# Patient Record
Sex: Male | Born: 1940 | Race: White | Hispanic: No | Marital: Married | Smoking: Former smoker
Health system: Southern US, Community
[De-identification: ages and names within clinical notes are randomized; demographics above are authoritative.]

## PROBLEM LIST (undated history)

## (undated) DIAGNOSIS — M5430 Sciatica, unspecified side: Secondary | ICD-10-CM

## (undated) DIAGNOSIS — G629 Polyneuropathy, unspecified: Secondary | ICD-10-CM

## (undated) DIAGNOSIS — Z8546 Personal history of malignant neoplasm of prostate: Secondary | ICD-10-CM

## (undated) DIAGNOSIS — I714 Abdominal aortic aneurysm, without rupture, unspecified: Secondary | ICD-10-CM

## (undated) DIAGNOSIS — C61 Malignant neoplasm of prostate: Secondary | ICD-10-CM

## (undated) DIAGNOSIS — R002 Palpitations: Secondary | ICD-10-CM

## (undated) DIAGNOSIS — M47816 Spondylosis without myelopathy or radiculopathy, lumbar region: Secondary | ICD-10-CM

## (undated) DIAGNOSIS — E782 Mixed hyperlipidemia: Secondary | ICD-10-CM

## (undated) DIAGNOSIS — Z8619 Personal history of other infectious and parasitic diseases: Secondary | ICD-10-CM

## (undated) DIAGNOSIS — J439 Emphysema, unspecified: Secondary | ICD-10-CM

## (undated) DIAGNOSIS — J45909 Unspecified asthma, uncomplicated: Secondary | ICD-10-CM

## (undated) DIAGNOSIS — R339 Retention of urine, unspecified: Secondary | ICD-10-CM

## (undated) DIAGNOSIS — N35919 Unspecified urethral stricture, male, unspecified site: Secondary | ICD-10-CM

## (undated) DIAGNOSIS — Z972 Presence of dental prosthetic device (complete) (partial): Secondary | ICD-10-CM

## (undated) DIAGNOSIS — I480 Paroxysmal atrial fibrillation: Secondary | ICD-10-CM

## (undated) HISTORY — PX: EYE SURGERY: SHX253

## (undated) HISTORY — PX: OTHER SURGICAL HISTORY: SHX169

## (undated) HISTORY — DX: Mixed hyperlipidemia: E78.2

## (undated) HISTORY — DX: Palpitations: R00.2

## (undated) HISTORY — DX: Spondylosis without myelopathy or radiculopathy, lumbar region: M47.816

## (undated) HISTORY — DX: Malignant neoplasm of prostate: C61

## (undated) HISTORY — DX: Emphysema, unspecified: J43.9

---

## 2001-11-29 ENCOUNTER — Ambulatory Visit (HOSPITAL_COMMUNITY): Admission: RE | Admit: 2001-11-29 | Discharge: 2001-11-29 | Payer: Self-pay | Admitting: Family Medicine

## 2001-11-29 ENCOUNTER — Encounter: Payer: Self-pay | Admitting: Family Medicine

## 2002-12-02 ENCOUNTER — Ambulatory Visit (HOSPITAL_COMMUNITY): Admission: RE | Admit: 2002-12-02 | Discharge: 2002-12-02 | Payer: Self-pay | Admitting: Family Medicine

## 2002-12-02 ENCOUNTER — Encounter: Payer: Self-pay | Admitting: Family Medicine

## 2002-12-26 ENCOUNTER — Encounter: Payer: Self-pay | Admitting: *Deleted

## 2002-12-26 ENCOUNTER — Encounter (HOSPITAL_COMMUNITY): Admission: RE | Admit: 2002-12-26 | Discharge: 2003-01-25 | Payer: Self-pay | Admitting: *Deleted

## 2003-02-14 ENCOUNTER — Ambulatory Visit (HOSPITAL_COMMUNITY): Admission: RE | Admit: 2003-02-14 | Discharge: 2003-02-14 | Payer: Self-pay | Admitting: Internal Medicine

## 2003-02-14 HISTORY — PX: COLONOSCOPY: SHX174

## 2003-12-04 ENCOUNTER — Ambulatory Visit (HOSPITAL_COMMUNITY): Admission: RE | Admit: 2003-12-04 | Discharge: 2003-12-04 | Payer: Self-pay | Admitting: Family Medicine

## 2004-07-20 ENCOUNTER — Ambulatory Visit: Payer: Self-pay | Admitting: *Deleted

## 2004-07-20 ENCOUNTER — Inpatient Hospital Stay (HOSPITAL_COMMUNITY): Admission: EM | Admit: 2004-07-20 | Discharge: 2004-07-23 | Payer: Self-pay | Admitting: Emergency Medicine

## 2004-07-22 ENCOUNTER — Ambulatory Visit: Payer: Self-pay | Admitting: *Deleted

## 2004-07-23 ENCOUNTER — Ambulatory Visit: Payer: Self-pay | Admitting: *Deleted

## 2004-08-07 ENCOUNTER — Ambulatory Visit: Payer: Self-pay | Admitting: *Deleted

## 2004-08-21 ENCOUNTER — Ambulatory Visit (HOSPITAL_COMMUNITY): Admission: RE | Admit: 2004-08-21 | Discharge: 2004-08-21 | Payer: Self-pay | Admitting: *Deleted

## 2004-08-22 ENCOUNTER — Ambulatory Visit: Payer: Self-pay | Admitting: *Deleted

## 2004-08-29 ENCOUNTER — Ambulatory Visit: Payer: Self-pay | Admitting: *Deleted

## 2004-09-04 ENCOUNTER — Ambulatory Visit: Payer: Self-pay | Admitting: Critical Care Medicine

## 2004-09-06 ENCOUNTER — Ambulatory Visit (HOSPITAL_COMMUNITY): Admission: RE | Admit: 2004-09-06 | Discharge: 2004-09-06 | Payer: Self-pay | Admitting: Critical Care Medicine

## 2004-09-25 ENCOUNTER — Ambulatory Visit: Payer: Self-pay | Admitting: *Deleted

## 2004-10-09 ENCOUNTER — Ambulatory Visit: Payer: Self-pay | Admitting: Critical Care Medicine

## 2004-12-23 ENCOUNTER — Ambulatory Visit (HOSPITAL_COMMUNITY): Admission: RE | Admit: 2004-12-23 | Discharge: 2004-12-23 | Payer: Self-pay | Admitting: Family Medicine

## 2005-01-15 ENCOUNTER — Ambulatory Visit (HOSPITAL_COMMUNITY): Admission: RE | Admit: 2005-01-15 | Discharge: 2005-01-15 | Payer: Self-pay | Admitting: Critical Care Medicine

## 2005-02-20 ENCOUNTER — Ambulatory Visit: Payer: Self-pay | Admitting: *Deleted

## 2005-05-16 ENCOUNTER — Ambulatory Visit: Payer: Self-pay | Admitting: Internal Medicine

## 2005-05-30 ENCOUNTER — Ambulatory Visit: Payer: Self-pay | Admitting: Internal Medicine

## 2005-07-16 ENCOUNTER — Other Ambulatory Visit: Admission: RE | Admit: 2005-07-16 | Discharge: 2005-07-16 | Payer: Self-pay | Admitting: Urology

## 2005-08-11 ENCOUNTER — Ambulatory Visit: Payer: Self-pay | Admitting: *Deleted

## 2005-08-13 ENCOUNTER — Ambulatory Visit (HOSPITAL_COMMUNITY): Admission: RE | Admit: 2005-08-13 | Discharge: 2005-08-13 | Payer: Self-pay | Admitting: *Deleted

## 2005-08-29 ENCOUNTER — Ambulatory Visit: Payer: Self-pay | Admitting: *Deleted

## 2005-09-26 ENCOUNTER — Observation Stay (HOSPITAL_COMMUNITY): Admission: RE | Admit: 2005-09-26 | Discharge: 2005-09-27 | Payer: Self-pay | Admitting: Urology

## 2005-09-26 ENCOUNTER — Encounter (INDEPENDENT_AMBULATORY_CARE_PROVIDER_SITE_OTHER): Payer: Self-pay | Admitting: Urology

## 2005-12-22 ENCOUNTER — Ambulatory Visit (HOSPITAL_COMMUNITY): Admission: RE | Admit: 2005-12-22 | Discharge: 2005-12-22 | Payer: Self-pay | Admitting: Family Medicine

## 2006-08-13 ENCOUNTER — Ambulatory Visit: Payer: Self-pay | Admitting: Critical Care Medicine

## 2006-08-14 ENCOUNTER — Ambulatory Visit: Payer: Self-pay | Admitting: Cardiology

## 2006-08-21 ENCOUNTER — Ambulatory Visit: Payer: Self-pay | Admitting: Internal Medicine

## 2006-09-11 ENCOUNTER — Ambulatory Visit: Payer: Self-pay | Admitting: Critical Care Medicine

## 2006-10-05 ENCOUNTER — Ambulatory Visit: Admission: RE | Admit: 2006-10-05 | Discharge: 2007-01-03 | Payer: Self-pay | Admitting: Radiation Oncology

## 2006-11-06 ENCOUNTER — Ambulatory Visit (HOSPITAL_COMMUNITY): Admission: RE | Admit: 2006-11-06 | Discharge: 2006-11-06 | Payer: Self-pay | Admitting: Family Medicine

## 2006-11-26 ENCOUNTER — Ambulatory Visit (HOSPITAL_BASED_OUTPATIENT_CLINIC_OR_DEPARTMENT_OTHER): Admission: RE | Admit: 2006-11-26 | Discharge: 2006-11-26 | Payer: Self-pay | Admitting: Urology

## 2006-11-26 HISTORY — PX: OTHER SURGICAL HISTORY: SHX169

## 2007-03-24 ENCOUNTER — Ambulatory Visit (HOSPITAL_BASED_OUTPATIENT_CLINIC_OR_DEPARTMENT_OTHER): Admission: RE | Admit: 2007-03-24 | Discharge: 2007-03-24 | Payer: Self-pay | Admitting: Urology

## 2007-04-14 ENCOUNTER — Ambulatory Visit (HOSPITAL_COMMUNITY): Admission: RE | Admit: 2007-04-14 | Discharge: 2007-04-14 | Payer: Self-pay | Admitting: Family Medicine

## 2007-04-19 ENCOUNTER — Ambulatory Visit (HOSPITAL_COMMUNITY): Admission: RE | Admit: 2007-04-19 | Discharge: 2007-04-19 | Payer: Self-pay | Admitting: Family Medicine

## 2007-07-12 ENCOUNTER — Ambulatory Visit: Payer: Self-pay | Admitting: Internal Medicine

## 2007-07-14 ENCOUNTER — Ambulatory Visit (HOSPITAL_COMMUNITY): Admission: RE | Admit: 2007-07-14 | Discharge: 2007-07-15 | Payer: Self-pay | Admitting: Internal Medicine

## 2007-07-16 ENCOUNTER — Ambulatory Visit: Payer: Self-pay | Admitting: Internal Medicine

## 2007-08-31 DIAGNOSIS — J984 Other disorders of lung: Secondary | ICD-10-CM

## 2007-08-31 DIAGNOSIS — I4891 Unspecified atrial fibrillation: Secondary | ICD-10-CM | POA: Insufficient documentation

## 2007-08-31 DIAGNOSIS — I10 Essential (primary) hypertension: Secondary | ICD-10-CM | POA: Insufficient documentation

## 2007-09-28 ENCOUNTER — Ambulatory Visit: Payer: Self-pay | Admitting: Critical Care Medicine

## 2007-11-11 ENCOUNTER — Ambulatory Visit: Payer: Self-pay | Admitting: Internal Medicine

## 2008-04-24 ENCOUNTER — Encounter (INDEPENDENT_AMBULATORY_CARE_PROVIDER_SITE_OTHER): Payer: Self-pay | Admitting: Urology

## 2008-04-24 ENCOUNTER — Ambulatory Visit (HOSPITAL_BASED_OUTPATIENT_CLINIC_OR_DEPARTMENT_OTHER): Admission: RE | Admit: 2008-04-24 | Discharge: 2008-04-24 | Payer: Self-pay | Admitting: Urology

## 2008-06-30 ENCOUNTER — Ambulatory Visit: Payer: Self-pay | Admitting: Cardiology

## 2008-09-29 ENCOUNTER — Ambulatory Visit: Payer: Self-pay | Admitting: Critical Care Medicine

## 2008-10-04 ENCOUNTER — Ambulatory Visit: Payer: Self-pay | Admitting: Critical Care Medicine

## 2008-10-05 ENCOUNTER — Encounter: Payer: Self-pay | Admitting: Critical Care Medicine

## 2008-11-12 ENCOUNTER — Emergency Department (HOSPITAL_COMMUNITY): Admission: EM | Admit: 2008-11-12 | Discharge: 2008-11-12 | Payer: Self-pay | Admitting: Emergency Medicine

## 2008-11-13 ENCOUNTER — Ambulatory Visit: Payer: Self-pay | Admitting: Cardiology

## 2008-11-16 ENCOUNTER — Ambulatory Visit: Payer: Self-pay | Admitting: Cardiology

## 2008-11-24 ENCOUNTER — Ambulatory Visit (HOSPITAL_COMMUNITY): Admission: RE | Admit: 2008-11-24 | Discharge: 2008-11-24 | Payer: Self-pay | Admitting: Physician Assistant

## 2008-11-24 ENCOUNTER — Ambulatory Visit: Payer: Self-pay | Admitting: Cardiology

## 2009-01-24 ENCOUNTER — Ambulatory Visit (HOSPITAL_BASED_OUTPATIENT_CLINIC_OR_DEPARTMENT_OTHER): Admission: RE | Admit: 2009-01-24 | Discharge: 2009-01-24 | Payer: Self-pay | Admitting: Urology

## 2009-02-19 DIAGNOSIS — R002 Palpitations: Secondary | ICD-10-CM

## 2009-02-22 ENCOUNTER — Encounter: Payer: Self-pay | Admitting: Cardiology

## 2009-02-22 ENCOUNTER — Ambulatory Visit: Payer: Self-pay | Admitting: Cardiology

## 2009-03-09 ENCOUNTER — Ambulatory Visit: Payer: Self-pay | Admitting: Cardiology

## 2009-06-06 ENCOUNTER — Ambulatory Visit (HOSPITAL_COMMUNITY): Admission: RE | Admit: 2009-06-06 | Discharge: 2009-06-06 | Payer: Self-pay | Admitting: Orthopedic Surgery

## 2009-08-17 ENCOUNTER — Ambulatory Visit: Payer: Self-pay | Admitting: Cardiology

## 2009-08-17 DIAGNOSIS — R011 Cardiac murmur, unspecified: Secondary | ICD-10-CM

## 2009-11-09 ENCOUNTER — Ambulatory Visit: Payer: Self-pay | Admitting: Critical Care Medicine

## 2009-11-13 ENCOUNTER — Encounter: Payer: Self-pay | Admitting: Critical Care Medicine

## 2010-03-21 ENCOUNTER — Ambulatory Visit: Payer: Self-pay | Admitting: Cardiology

## 2010-08-09 ENCOUNTER — Telehealth: Payer: Self-pay | Admitting: Critical Care Medicine

## 2010-08-16 ENCOUNTER — Ambulatory Visit: Payer: Self-pay | Admitting: Critical Care Medicine

## 2010-09-15 HISTORY — PX: CATARACT EXTRACTION W/ INTRAOCULAR LENS  IMPLANT, BILATERAL: SHX1307

## 2010-10-02 ENCOUNTER — Ambulatory Visit
Admission: RE | Admit: 2010-10-02 | Discharge: 2010-10-02 | Payer: Self-pay | Source: Home / Self Care | Attending: Cardiology | Admitting: Cardiology

## 2010-10-02 DIAGNOSIS — N320 Bladder-neck obstruction: Secondary | ICD-10-CM | POA: Insufficient documentation

## 2010-10-03 ENCOUNTER — Encounter: Payer: Self-pay | Admitting: Cardiology

## 2010-10-15 NOTE — Miscellaneous (Signed)
Summary: Orders Update  Clinical Lists Changes  Orders: Added new Service order of Est. Patient Level III (99213) - Signed 

## 2010-10-15 NOTE — Progress Notes (Signed)
Summary: requesting rx for albuterol inhaler  Phone Note Call from Patient Call back at Home Phone [phone]   Caller: Pt Call For: PW Summary of Call: Pt called answering service and was reqquesting an rx for an albuterol inhaler. Pt was told our office was closed.   I called the pt and he states every fall he notices that he has increased SOB with exertion. he also c/o increased chest congestion, but denies cough. He states he just feels like his Spiriva does not las him all day and is requesting a rx for an albuterol inhaler to use when needed. Please advise.Carron Curie CMA  August 09, 2010 10:06 AM  [facility] [place]  Follow-up for Phone Call        ok to call in proair 1-2 puff q4-6 h as needed #1 with 4 refills might want to come in for OV sooner than his yearly Feb appt   Follow-up by: Storm Frisk MD,  August 09, 2010 12:40 PM  Additional Follow-up for Phone Call Additional follow up Details #1::        pt advsied and rx sent and appt set for 08-16-10 at 10:20. Carron Curie CMA  August 09, 2010 12:45 PM     New/Updated Medications: PROAIR HFA 108 (90 BASE) MCG/ACT AERS (ALBUTEROL SULFATE) 1-2 puffs every 4-6 hours as needed Prescriptions: PROAIR HFA 108 (90 BASE) MCG/ACT AERS (ALBUTEROL SULFATE) 1-2 puffs every 4-6 hours as needed  #1 x 4   Entered by:   Carron Curie CMA   Authorized by:   Storm Frisk MD   Signed by:   Carron Curie CMA on 08/09/2010   Method used:   Electronically to        [facility] (retail)       [address]       [place], [place]  [address]       Ph: [phone]       Fax: [phone]   RxID:   [id number]

## 2010-10-15 NOTE — Assessment & Plan Note (Signed)
Summary: 6 mth f/u per checkout on 08/17/09/tg   Visit Type:  Follow-up Referring Provider:  Dr. Shan Levans Primary Provider:  Dr. Butch Penny   History of Present Illness: 70 year old male presents for followup. He was last seen back in December 2010. He is here with his wife. He reports no change in baseline dyspnea on exertion in the setting of COPD, followed by Dr. Delford Field. He has occasional, brief palpitations, none prolonged or associated with chest pain. He reports compliance with his cardiac medications which are outlined below. He has had no definite episodes of recurrent sustained atrial fibrillation at least over the last year. He continues on aspirin in light of low CHADS2 score.  Current Medications (verified): 1)  Adult Aspirin Low Strength 81 Mg  Tbdp (Aspirin) .... Take 1 Tablet By Mouth Once A Day 2)  Multiple Vitamins   Tabs (Multiple Vitamin) .... Take 1 Tablet By Mouth Once A Day 3)  Neurontin 600 Mg Tabs (Gabapentin) .... Take 1 Tab Three Times A Day 4)  Ra Fish Oil 1000 Mg Caps (Omega-3 Fatty Acids) .... Take 1 Tablet By Mouth Once A Day 5)  Vitamin D 400 Unit Caps (Cholecalciferol) .... Take 1 Tab Daily 6)  Cardizem Cd 120 Mg Xr24h-Cap (Diltiazem Hcl Coated Beads) .... Take 2 Tablets in The Morning and 1 Tablet in The Evening 7)  Spiriva Handihaler 18 Mcg Caps (Tiotropium Bromide Monohydrate) .... Inhale Contents of 1 Capsule Once Daily 8)  Tramadol Hcl 50 Mg Tabs (Tramadol Hcl) .... Two Times A Day 9)  Lortab 5-500 Mg Tabs (Hydrocodone-Acetaminophen) .... Use As Directed 10)  Lyrica 150 Mg Caps (Pregabalin) .... Take 1 Tab Two Times A Day  Allergies (verified): No Known Drug Allergies  Past History:  Past Medical History: Last updated: 11/09/2009 Prostate cancer s/p radium seed implant with PSA now <2 2008 Pulmonary nodule - stable with serial CT Chest 2005-2007 COPD - FeV1 70%  TLC 114% DLCO 74% 2010 Atrial  Fibrillation Hyperlipidemia Hypertension Palpitations Cardiac murmur Low risk Myoview November 2005 Neuropathy lower back DJD lumbar spine  Social History: Last updated: 08/17/2009 Patient states former smoker Alcohol Use - no  Clinical Review Panels:  Echocardiogram Echocardiogram  FINDINGS:  Technical quality of the study is extremely poor.  There were  only limited views available.  Most specifically, the subcostal view  appeared to be the most accurate where there were some apical views which  allowed assessment of all the walls.  M-mode tracings were inadequate.   2-D AND DOPPLER IMAGING:  The left ventricle appears to be normal size with  normal systolic function.  Estimated ejection fraction is 60-65%.  I did not  see any significant wall motion abnormalities.  There appears to be no  significant valvular heart disease.  There is no pericardial effusion.  Beyond that, the study is tremendously limited.  The right ventricle also  appears to be normal size. (07/22/2004)    Review of Systems       The patient complains of dyspnea on exertion.  The patient denies anorexia, fever, chest pain, syncope, peripheral edema, prolonged cough, melena, hematochezia, and severe indigestion/heartburn.         Otherwise reviewed and negative except as outlined.  Vital Signs:  Patient profile:   70 year old male Weight:      180 pounds Pulse rate:   74 / minute BP sitting:   125 / 71  (right arm)  Vitals Entered By: Dreama Saa, CNA (March 21, 2010 1:51 PM)  Physical Exam  Additional Exam:  Comfortable in no acute distress. HEENT: Conjuctivae and lids normal, oropharynx clear with moist mucosa. Neck: Supple, no elevated JVP, no loud carotid bruits, no thyromegaly or tenderness. Lungs: Nonlabored breathing at rest. CTA with diminished breath sounds but no wheezes. Cor: PMI nondisplaced. RRR with occasional ectopy, normal S1/S2. No pathologic systolic murmurs. No S3 or  rub. Abd: Soft, NTND.  No HSM. No bruits. Normoactive bowel sounds. Ext: No CCE. Distal pulses 1+.    Impression & Recommendations:  Problem # 1:  ATRIAL FIBRILLATION, PAROXYSMAL (ICD-427.31)  Well-controlled on present medical regimen, without any obvious sustained breakthrough arrhythmias. He continues on aspirin with low CHADS2 score. Otherwise no changes of present rate control regimen. Follow up in 6 months, sooner if needed.  His updated medication list for this problem includes:    Adult Aspirin Low Strength 81 Mg Tbdp (Aspirin) .Marland Kitchen... Take 1 tablet by mouth once a day  Problem # 2:  HYPERTENSION (ICD-401.9)  Blood pressure well-controlled today. No medical regimen changes.  His updated medication list for this problem includes:    Adult Aspirin Low Strength 81 Mg Tbdp (Aspirin) .Marland Kitchen... Take 1 tablet by mouth once a day    Cardizem Cd 120 Mg Xr24h-cap (Diltiazem hcl coated beads) .Marland Kitchen... Take 2 tablets in the morning and 1 tablet in the evening  Patient Instructions: 1)  Your physician recommends that you schedule a follow-up appointment in: 6 months 2)  Your physician recommends that you continue on your current medications as directed. Please refer to the Current Medication list given to you today. Prescriptions: CARDIZEM CD 120 MG XR24H-CAP (DILTIAZEM HCL COATED BEADS) take 2 tablets in the morning and 1 tablet in the evening  #90 x 6   Entered by:   Larita Fife Via LPN   Authorized by:   Loreli Slot, MD, [facility]   Signed by:   Larita Fife Via LPN on 47/82/9562   Method used:   Electronically to        [facility] (retail)       [address]       [place], [place]  [address]       Ph: [phone]       Fax: [phone]   RxID:   [id number]

## 2010-10-15 NOTE — Assessment & Plan Note (Signed)
Summary: Pulmonary OV   Primary Provider/Referring Provider:  Dr. Butch Penny  CC:  Yearly follow up for COPD.  states breathing is the same-no better or worse.  denies wheezing and chest tightness.  states he is coughing "very little"-dry.  states this started in January when he had a cold.  Requesting rxs for [facility] [place].Marland Kitchen  History of Present Illness: This is a 70 yo WM with COPD Golds Stage I  September 29, 2008 9:06 AM one year f/u. had bladder neck work done few months ago per GU that was prostrate ca complication No new resp complaints.  cold air is an issue. No mucous.  No chest pain.   Pt denies any significant sore throat, nasal congestion or excess secretions, fever, chills, sweats, unintended weight loss, pleurtic or exertional chest pain, orthopnea PND, or leg swelling Pt denies any increase in rescue therapy over baseline, denies waking up needing it or having any early am or nocturnal exacerbations of coughing/wheezing/or dyspnea.   November 09, 2009 9:16 AM Yearly f/u.  No new issues excpet an issue in heat  and cold extremes.  sl ocugh and is dry.  1/11 was ill with viral syndrome and [facility] rx ABX and mucinex and this helped Pt denies any significant sore throat, nasal congestion or excess secretions, fever, chills, sweats, unintended weight loss, pleurtic or exertional chest pain, orthopnea PND, or leg swelling Pt denies any increase in rescue therapy over baseline, denies waking up needing it or having any early am or nocturnal exacerbations of coughing/wheezing/or dyspnea.   Preventive Screening-Counseling & Management  Alcohol-Tobacco     Smoking Status: quit > 6 months  Current Medications (verified): 1)  Adult Aspirin Low Strength 81 Mg  Tbdp (Aspirin) .... Take 1 Tablet By Mouth Once A Day 2)  Multiple Vitamins   Tabs (Multiple Vitamin) .... Take 1 Tablet By Mouth Once A Day 3)  Neurontin 600 Mg Tabs (Gabapentin) .... Take 1 Tab Three  Times A Day 4)  Ra Fish Oil 1000 Mg Caps (Omega-3 Fatty Acids) .... Take 1 Tablet By Mouth Once A Day 5)  Vitamin D 400 Unit Caps (Cholecalciferol) .... Take 1 Tab Daily 6)  Cardizem Cd 120 Mg Xr24h-Cap (Diltiazem Hcl Coated Beads) .... Take 2 Tablets in The Morning and 1 Tablet in The Evening 7)  Spiriva Handihaler 18 Mcg Caps (Tiotropium Bromide Monohydrate) .... Inhale Contents of 1 Capsule Once Daily 8)  Tramadol Hcl 50 Mg Tabs (Tramadol Hcl) .... Two Times A Day  Allergies (verified): No Known Drug Allergies  Past History:  Past medical, surgical, family and social histories (including risk factors) reviewed, and no changes noted (except as noted below).  Past Medical History: Prostate cancer s/p radium seed implant with PSA now <2 2008 Pulmonary nodule - stable with serial CT Chest 2005-2007 COPD - FeV1 70%  TLC 114% DLCO 74% 2010 Atrial Fibrillation Hyperlipidemia Hypertension Palpitations Cardiac murmur Low risk Myoview November 2005 Neuropathy lower back DJD lumbar spine  Past Surgical History: Reviewed history from 08/17/2009 and no changes required. Bladder neck stricture repair 2009  Past Pulmonary History:  Pulmonary History: Pulmonary Function Test  Date: 10/04/2008 Height (in.): 69 Gender: Male  Pre-Spirometry  FVC     Value: 4.26 L/min   Pred: 4.30 L/min     % Pred: 99 % FEV1     Value: 2.05 L     Pred: 2.94 L     % Pred: 70 % FEV1/FVC   Value:  48 %     Pred: 69 %     FEF 25-75   Value: 0.48 L/min   Pred: 2.74 L/min     % Pred: 18 %  Post-Spirometry  FVC     Value: 4.26 L/min   Pred: 4.30 L/min     % Pred: 99 % FEV1     Value: 1.95 L     Pred: 2.94 L     % Pred: 66 % FEV1/FVC   Value: 46 %     Pred: 69 %     FEF 25-75   Value: 0.42 L/min   Pred: 2.74 L/min     % Pred: 15 %  Lung Volumes  TLC     Value: 7.21 L   % Pred: 114 % RV     Value: 2.57 L   % Pred: 106 % DLCO     Value: 16.1 %   % Pred: 74 % DLCO/VA   Value: 2.67 %   % Pred: 75 %  Family  History: Reviewed history from 09/28/2007 and no changes required. COPD: mother died of copd  MI/Heart Attack brother expired of MI  Social History: Reviewed history from 08/17/2009 and no changes required. Patient states former smoker Alcohol Use - no Smoking Status:  quit > 6 months  Review of Systems       The patient complains of shortness of breath with activity.  The patient denies shortness of breath at rest, productive cough, non-productive cough, coughing up blood, chest pain, irregular heartbeats, acid heartburn, indigestion, loss of appetite, weight change, abdominal pain, difficulty swallowing, sore throat, tooth/dental problems, headaches, nasal congestion/difficulty breathing through nose, sneezing, itching, ear ache, anxiety, depression, hand/feet swelling, joint stiffness or pain, rash, change in color of mucus, and fever.    Vital Signs:  Patient profile:   70 year old male Height:      70 inches Weight:      186.25 pounds BMI:     26.82 O2 Sat:      94 % on Room air Temp:     97.7 degrees F oral Pulse rate:   82 / minute BP sitting:   130 / 70  (left arm) Cuff size:   regular  Vitals Entered By: Gweneth Dimitri RN (November 09, 2009 9:08 AM)  O2 Flow:  Room air CC: Yearly follow up for COPD.  states breathing is the same-no better or worse.  denies wheezing and chest tightness.  states he is coughing "very little"-dry.  states this started in January when he had a cold.  Requesting rxs for [facility] [place]. Comments Medications reviewed with patient Daytime contact number verified with patient. Gweneth Dimitri RN  November 09, 2009 9:10 AM    Physical Exam  Additional Exam:  Comfortable in no acute distress. HEENT: Conjuctivae and lids normal, oropharynx clear with moist mucosa. Neck: Supple, no elevated JVP, no loud carotid bruits, no thyromegaly or tenderness. Lungs: Nonlabored breathing at rest. CTA with diminished breath sounds but no  wheezes. Cor: PMI nondisplaced. RRR with occasional ectopy, normal S1/S2. No pathologic systolic murmurs. No S3 or rub. Abd: Soft, NTND.  No HSM. No bruits. Normoactive bowel sounds. Ext: No CCE. Distal pulses 1+. MS: No kyphosis noted. Neuropsych: A&Ox3, affect grossly normal.    Impression & Recommendations:  Problem # 1:  COPD (ICD-496) Assessment Unchanged Copd Golds stage I stable plan No change in inhaled medications.   Maintain treatment program as currently prescribed.  Medications  Added to Medication List This Visit: 1)  Tramadol Hcl 50 Mg Tabs (Tramadol hcl) .... Two times a day  Complete Medication List: 1)  Adult Aspirin Low Strength 81 Mg Tbdp (Aspirin) .... Take 1 tablet by mouth once a day 2)  Multiple Vitamins Tabs (Multiple vitamin) .... Take 1 tablet by mouth once a day 3)  Neurontin 600 Mg Tabs (Gabapentin) .... Take 1 tab three times a day 4)  Ra Fish Oil 1000 Mg Caps (Omega-3 fatty acids) .... Take 1 tablet by mouth once a day 5)  Vitamin D 400 Unit Caps (Cholecalciferol) .... Take 1 tab daily 6)  Cardizem Cd 120 Mg Xr24h-cap (Diltiazem hcl coated beads) .... Take 2 tablets in the morning and 1 tablet in the evening 7)  Spiriva Handihaler 18 Mcg Caps (Tiotropium bromide monohydrate) .... Inhale contents of 1 capsule once daily 8)  Tramadol Hcl 50 Mg Tabs (Tramadol hcl) .... Two times a day  Patient Instructions: 1)  No change in medications 2)  Return 12 months or sooner as needed Prescriptions: SPIRIVA HANDIHALER 18 MCG CAPS (TIOTROPIUM BROMIDE MONOHYDRATE) INHALE CONTENTS OF 1 CAPSULE ONCE DAILY  #30 x 12   Entered and Authorized by:   Storm Frisk MD   Signed by:   Storm Frisk MD on 11/09/2009   Method used:   Electronically to        [facility] (retail)       [address]       [place], [place]  [address]       Ph: [phone]       Fax: [phone]   RxID:   [id number]    Immunization  History:  Influenza Immunization History:    Influenza:  fluvax 3+ (06/21/2009)  Pneumovax Immunization History:    Pneumovax:  pneumovax (05/18/2008)  Appended Document: Pulmonary OV fax angus mcginnis

## 2010-10-15 NOTE — Assessment & Plan Note (Signed)
Summary: Pulmonary OV   Copy to:  Dr. Shan Levans Primary [facility]/Referring Jakara Blatter:  Dr. Butch Penny  CC:  Follow up.  Pt states breathing is slightly worse.  Pt states he is having increased SOB in the mornings - onset late October -- using proair before bedtime to help with this.  Occas wheezing.  Occas prod cough with clearish green phelgm.  .  History of Present Illness: This is a 70 yo WM with COPD Golds Stage I  September 29, 2008 9:06 AM one year f/u. had bladder neck work done few months ago per GU that was prostrate ca complication No new resp complaints.  cold air is an issue. No mucous.  No chest pain.   Pt denies any significant sore throat, nasal congestion or excess secretions, fever, chills, sweats, unintended weight loss, pleurtic or exertional chest pain, orthopnea PND, or leg swelling Pt denies any increase in rescue therapy over baseline, denies waking up needing it or having any early am or nocturnal exacerbations of coughing/wheezing/or dyspnea.   November 09, 2009 9:16 AM Yearly f/u.  No new issues excpet an issue in heat  and cold extremes.  sl ocugh and is dry.  1/11 was ill with viral syndrome and [facility] rx ABX and mucinex and this helped Pt denies any significant sore throat, nasal congestion or excess secretions, fever, chills, sweats, unintended weight loss, pleurtic or exertional chest pain, orthopnea PND, or leg swelling Pt denies any increase in rescue therapy over baseline, denies waking up needing it or having any early am or nocturnal exacerbations of coughing/wheezing/or dyspnea. August 16, 2010 10:36 AM Since 2/11, had one episode  in end of 11/11 had to get albuterol having an issue in early am with dyspnea ,  now uses proair at bedtime and helps all night, and takes spiriva in AM No mucus now.  No chest pain.  No wheeze.  No sinus drip.  Preventive Screening-Counseling & Management  Alcohol-Tobacco     Smoking Status: quit > 6 months   Year Quit: 2000     Pack years: 78  Clinical Reports Reviewed:  PFT's:  10/05/2008: DLCO %Predicted:  74 FEF 25/75 %Predicted:  18 FEV1 %Predicted:  70 FVC %Predicted:  99 Post Spirometry FEF 25/75 %Predicted:  15 Post Spirometry FEV1 %Predicted:  66 Post Spirometry FVC %Predicted:  99 RV %Predicted:  106 TLC %Predicted:  114   Current Medications (verified): 1)  Adult Aspirin Low Strength 81 Mg  Tbdp (Aspirin) .... Take 1 Tablet By Mouth Once A Day 2)  Multiple Vitamins   Tabs (Multiple Vitamin) .... Take 1 Tablet By Mouth Once A Day 3)  Ra Fish Oil 1000 Mg Caps (Omega-3 Fatty Acids) .... Take 1 Tablet By Mouth Once A Day 4)  Vitamin D 400 Unit Caps (Cholecalciferol) .... Take 1 Tab Daily 5)  Cardizem Cd 120 Mg Xr24h-Cap (Diltiazem Hcl Coated Beads) .... Take 2 Tablets in The Morning and 1 Tablet in The Evening 6)  Spiriva Handihaler 18 Mcg Caps (Tiotropium Bromide Monohydrate) .... Inhale Contents of 1 Capsule Once Daily 7)  Tramadol Hcl 50 Mg Tabs (Tramadol Hcl) .... Two Times A Day 8)  Lortab 5-500 Mg Tabs (Hydrocodone-Acetaminophen) .... Use As Directed 9)  Lyrica 150 Mg Caps (Pregabalin) .... Take 1 Tab Two Times A Day 10)  Proair Hfa 108 (90 Base) Mcg/act Aers (Albuterol Sulfate) .Marland Kitchen.. 1-2 Puffs Every 4-6 Hours As Needed  Allergies (verified): No Known Drug Allergies  Past History:  Past medical, surgical, family and social histories (including risk factors) reviewed, and no changes noted (except as noted below).  Past Medical History: Reviewed history from 11/09/2009 and no changes required. Prostate cancer s/p radium seed implant with PSA now <2 2008 Pulmonary nodule - stable with serial CT Chest 2005-2007 COPD - FeV1 70%  TLC 114% DLCO 74% 2010 Atrial Fibrillation Hyperlipidemia Hypertension Palpitations Cardiac murmur Low risk Myoview November 2005 Neuropathy lower back DJD lumbar spine  Past Surgical History: Reviewed history from 08/17/2009 and no  changes required. Bladder neck stricture repair 2009  Past Pulmonary History:  Pulmonary History: Pulmonary Function Test  Date: 10/04/2008 Height (in.): 69 Gender: Male  Pre-Spirometry  FVC     Value: 4.26 L/min   Pred: 4.30 L/min     % Pred: 99 % FEV1     Value: 2.05 L     Pred: 2.94 L     % Pred: 70 % FEV1/FVC   Value: 48 %     Pred: 69 %     FEF 25-75   Value: 0.48 L/min   Pred: 2.74 L/min     % Pred: 18 %  Post-Spirometry  FVC     Value: 4.26 L/min   Pred: 4.30 L/min     % Pred: 99 % FEV1     Value: 1.95 L     Pred: 2.94 L     % Pred: 66 % FEV1/FVC   Value: 46 %     Pred: 69 %     FEF 25-75   Value: 0.42 L/min   Pred: 2.74 L/min     % Pred: 15 %  Lung Volumes  TLC     Value: 7.21 L   % Pred: 114 % RV     Value: 2.57 L   % Pred: 106 % DLCO     Value: 16.1 %   % Pred: 74 % DLCO/VA   Value: 2.67 %   % Pred: 75 %  Family History: Reviewed history from 09/28/2007 and no changes required. COPD: mother died of copd  MI/Heart Attack brother expired of MI  Social History: Reviewed history from 08/17/2009 and no changes required. Patient states former smoker.  Quit in 2000.  Smoked for 45 yrs - up to 2ppd. Alcohol Use - no  Review of Systems       The patient complains of shortness of breath with activity.  The patient denies shortness of breath at rest, productive cough, non-productive cough, coughing up blood, chest pain, irregular heartbeats, acid heartburn, indigestion, loss of appetite, weight change, abdominal pain, difficulty swallowing, sore throat, tooth/dental problems, headaches, nasal congestion/difficulty breathing through nose, sneezing, itching, ear ache, anxiety, depression, hand/feet swelling, joint stiffness or pain, rash, change in color of mucus, and fever.    Vital Signs:  Patient profile:   70 year old male Height:      70 inches Weight:      180 pounds BMI:     25.92 O2 Sat:      94 % on Room air Temp:     97.7 degrees F oral Pulse rate:   80 /  minute BP sitting:   130 / 78  (left arm) Cuff size:   regular  Vitals Entered By: Gweneth Dimitri RN (August 16, 2010 10:23 AM)  O2 Flow:  Room air CC: Follow up.  Pt states breathing is slightly worse.  Pt states he is having increased SOB in the mornings - onset late  October -- using proair before bedtime to help with this.  Occas wheezing.  Occas prod cough with clearish green phelgm.   Comments Medications reviewed with patient Daytime contact number verified with patient. Gweneth Dimitri RN  August 16, 2010 10:24 AM    Physical Exam  Additional Exam:  Comfortable in no acute distress. HEENT: Conjuctivae and lids normal, oropharynx clear with moist mucosa. Neck: Supple, no elevated JVP, no loud carotid bruits, no thyromegaly or tenderness. Lungs: Nonlabored breathing at rest. CTA with diminished breath sounds but no wheezes. Cor: PMI nondisplaced. RRR with occasional ectopy, normal S1/S2. No pathologic systolic murmurs. No S3 or rub. Abd: Soft, NTND.  No HSM. No bruits. Normoactive bowel sounds. Ext: No CCE. Distal pulses 1+. MS: No kyphosis noted. Neuropsych: A&Ox3, affect grossly normal.    Impression & Recommendations:  Problem # 1:  COPD (ICD-496) Assessment Unchanged  Copd Golds stage I  worse with more lower airway inflammation plan No change in inhaled medications.   pulse prednisone  Medications Added to Medication List This Visit: 1)  Prednisone 10 Mg Tabs (Prednisone) .... Take as directed take 4 daily for two days, then 3 daily for two days, then two daily for two days then one daily for two days then stop  Complete Medication List: 1)  Adult Aspirin Low Strength 81 Mg Tbdp (Aspirin) .... Take 1 tablet by mouth once a day 2)  Multiple Vitamins Tabs (Multiple vitamin) .... Take 1 tablet by mouth once a day 3)  Ra Fish Oil 1000 Mg Caps (Omega-3 fatty acids) .... Take 1 tablet by mouth once a day 4)  Vitamin D 400 Unit Caps (Cholecalciferol) .... Take 1 tab  daily 5)  Cardizem Cd 120 Mg Xr24h-cap (Diltiazem hcl coated beads) .... Take 2 tablets in the morning and 1 tablet in the evening 6)  Spiriva Handihaler 18 Mcg Caps (Tiotropium bromide monohydrate) .... Inhale contents of 1 capsule once daily 7)  Tramadol Hcl 50 Mg Tabs (Tramadol hcl) .... Two times a day 8)  Lortab 5-500 Mg Tabs (Hydrocodone-acetaminophen) .... Use as directed 9)  Lyrica 150 Mg Caps (Pregabalin) .... Take 1 tab two times a day 10)  Proair Hfa 108 (90 Base) Mcg/act Aers (Albuterol sulfate) .Marland Kitchen.. 1-2 puffs every 4-6 hours as needed 11)  Prednisone 10 Mg Tabs (Prednisone) .... Take as directed take 4 daily for two days, then 3 daily for two days, then two daily for two days then one daily for two days then stop  Other Orders: Est. Patient Level IV (16109)  Patient Instructions: 1)  Prednisone 10mg  Take 4 daily for two days, then 3 daily for two days, then two daily for two days then one daily for two days then stop 2)  Stay on spiriva 3)  Use proair as needed  4)  Return 3 months for recheck 5)  refills sent Prescriptions: SPIRIVA HANDIHALER 18 MCG CAPS (TIOTROPIUM BROMIDE MONOHYDRATE) INHALE CONTENTS OF 1 CAPSULE ONCE DAILY  #30 x 12   Entered and Authorized by:   Storm Frisk MD   Signed by:   Storm Frisk MD on 08/16/2010   Method used:   Electronically to        [facility] (retail)       [address]       [place], [place]  [address]       Ph: [phone]  Fax: [phone]   RxID:   [id number] PREDNISONE 10 MG  TABS (PREDNISONE) Take as directed Take 4 daily for two days, then 3 daily for two days, then two daily for two days then one daily for two days then stop  #20 x 0   Entered and Authorized by:   Storm Frisk MD   Signed by:   Storm Frisk MD on 08/16/2010   Method used:   Electronically to        [facility] (retail)       [address]       [place],  [place]  [address]       Ph: [phone]       Fax: [phone]   RxID:   [id number]    Immunization History:  Influenza Immunization History:    Influenza:  historical (06/15/2010)   Appended Document: Pulmonary OV fax angus mcginnis

## 2010-10-17 NOTE — Assessment & Plan Note (Signed)
Summary: Z6X   Visit Type:  Follow-up Referring Provider:  Dr. Shan Levans Primary Provider:  Dr. Butch Penny   History of Present Illness: 70 year old male presents for followup. He was seen back in July 2011, and had interval followup with Dr. Delford Field in December.  He reports only occasional, brief palpitations, none prolonged. No anginal chest pain. Still has shortness of breath related to his COPD, followed closely by Dr. Delford Field.  Reports compliance with medications outlined below. He needs refills for diltiazem.  Current Medications (verified): 1)  Adult Aspirin Low Strength 81 Mg  Tbdp (Aspirin) .... Take 1 Tablet By Mouth Once A Day 2)  Multiple Vitamins   Tabs (Multiple Vitamin) .... Take 1 Tablet By Mouth Once A Day 3)  Ra Fish Oil 1000 Mg Caps (Omega-3 Fatty Acids) .... Take 1 Tablet By Mouth Once A Day 4)  Vitamin D 400 Unit Caps (Cholecalciferol) .... Take 1 Tab Daily 5)  Cardizem Cd 120 Mg Xr24h-Cap (Diltiazem Hcl Coated Beads) .... Take 2 Tablets in The Morning and 1 Tablet in The Evening 6)  Spiriva Handihaler 18 Mcg Caps (Tiotropium Bromide Monohydrate) .... Inhale Contents of 1 Capsule Once Daily 7)  Tramadol Hcl 50 Mg Tabs (Tramadol Hcl) .... Two Times A Day 8)  Lortab 5-500 Mg Tabs (Hydrocodone-Acetaminophen) .... Use As Directed 9)  Lyrica 150 Mg Caps (Pregabalin) .... Take 1 Tab Two Times A Day 10)  Proair Hfa 108 (90 Base) Mcg/act Aers (Albuterol Sulfate) .Marland Kitchen.. 1-2 Puffs Every 4-6 Hours As Needed  Allergies (verified): No Known Drug Allergies  Comments:  Nurse/Medical Assistant: patient brought meds but didnt bring multi vit,proair and vit d [facility] [place] [facility]  Past History:  Past Medical History: Last updated: 11/09/2009 Prostate cancer s/p radium seed implant with PSA now <2 2008 Pulmonary nodule - stable with serial CT Chest 2005-2007 COPD - FeV1 70%  TLC 114% DLCO 74% 2010 Atrial  Fibrillation Hyperlipidemia Hypertension Palpitations Cardiac murmur Low risk Myoview November 2005 Neuropathy lower back DJD lumbar spine  Social History: Last updated: 08/16/2010 Patient states former smoker.  Quit in 2000.  Smoked for 45 yrs - up to 2ppd. Alcohol Use - no  Review of Systems       The patient complains of dyspnea on exertion.  The patient denies anorexia, fever, chest pain, syncope, peripheral edema, prolonged cough, melena, and hematochezia.         Some chest congestion, status post course of prednisone. Otherwise reviewed and negative.  Vital Signs:  Patient profile:   70 year old male Weight:      181 pounds BMI:     26.06 Pulse rate:   83 / minute BP sitting:   141 / 72  (left arm)  Vitals Entered By: Dreama Saa, CNA (October 02, 2010 3:13 PM)  Physical Exam  Additional Exam:  Comfortable in no acute distress. HEENT: Conjuctivae and lids normal, oropharynx clear with moist mucosa. Neck: Supple, no elevated JVP, no loud carotid bruits, no thyromegaly or tenderness. Lungs: Nonlabored breathing at rest. Diminished breath sounds but no wheezes. Cor: PMI nondisplaced. RRR. No pathologic systolic murmurs. No S3 or rub. Abd: Soft, NTND.  No HSM. No bruits. Normoactive bowel sounds. Ext: No CCE. Distal pulses 1+. MS: No kyphosis noted. Neuropsych: A&Ox3, affect grossly normal.    Impression & Recommendations:  Problem # 1:  ATRIAL FIBRILLATION, PAROXYSMAL (ICD-427.31)  Fairly quiescent. Plan continue present therapy.  Remains in sinus rhythm today by ECG.  His updated medication list for this problem includes:    Adult Aspirin Low Strength 81 Mg Tbdp (Aspirin) .Marland Kitchen... Take 1 tablet by mouth once a day  Problem # 2:  COPD (ICD-496)  Continue followup with Dr. Delford Field.  His updated medication list for this problem includes:    Spiriva Handihaler 18 Mcg Caps (Tiotropium bromide monohydrate) ..... Inhale contents of 1 capsule once daily    Proair  Hfa 108 (90 Base) Mcg/act Aers (Albuterol sulfate) .Marland Kitchen... 1-2 puffs every 4-6 hours as needed  Patient Instructions: 1)  Your physician recommends that you schedule a follow-up appointment in: 6 months 2)  Your physician recommends that you continue on your current medications as directed. Please refer to the Current Medication list given to you today. Prescriptions: CARDIZEM CD 120 MG XR24H-CAP (DILTIAZEM HCL COATED BEADS) take 2 tablets in the morning and 1 tablet in the evening  #90 x 6   Entered by:   Larita Fife Via LPN   Authorized by:   Loreli Slot, MD, [facility]   Signed by:   Larita Fife Via LPN on 16/06/9603   Method used:   Electronically to        [facility] (retail)       [address]       [place], [place]  [address]       Ph: [phone]       Fax: [phone]   RxID:   [id number]

## 2010-12-24 LAB — POCT I-STAT 4, (NA,K, GLUC, HGB,HCT)
Glucose, Bld: 94 mg/dL (ref 70–99)
HCT: 51 % (ref 39.0–52.0)
Hemoglobin: 17.3 g/dL — ABNORMAL HIGH (ref 13.0–17.0)
Potassium: 4.6 mEq/L (ref 3.5–5.1)
Sodium: 139 mEq/L (ref 135–145)

## 2010-12-31 LAB — POCT CARDIAC MARKERS
Myoglobin, poc: 92 ng/mL (ref 12–200)
Troponin i, poc: <0.05 ng/mL (ref 0.00–0.09)

## 2010-12-31 LAB — CBC
HCT: 46.8 % (ref 39.0–52.0)
Hemoglobin: 16.5 g/dL (ref 13.0–17.0)
MCHC: 35.2 g/dL (ref 30.0–36.0)
MCV: 94.2 fL (ref 78.0–100.0)
Platelets: 236 10*3/uL (ref 150–400)
RBC: 4.97 MIL/uL (ref 4.22–5.81)
RDW: 13.3 % (ref 11.5–15.5)
WBC: 9.6 10*3/uL (ref 4.0–10.5)

## 2010-12-31 LAB — BASIC METABOLIC PANEL
BUN: 11 mg/dL (ref 6–23)
CO2: 25 mEq/L (ref 19–32)
Calcium: 9.6 mg/dL (ref 8.4–10.5)
Chloride: 104 mEq/L (ref 96–112)
Creatinine, Ser: 1.04 mg/dL (ref 0.4–1.5)
GFR calc Af Amer: >60 mL/min (ref >60)
GFR calc non Af Amer: >60 mL/min (ref >60)
Glucose, Bld: 124 mg/dL — ABNORMAL HIGH (ref 70–99)
Potassium: 3.6 mEq/L (ref 3.5–5.1)
Sodium: 139 mEq/L (ref 135–145)

## 2010-12-31 LAB — DIFFERENTIAL
Basophils Absolute: 0 10*3/uL (ref 0.0–0.1)
Basophils Relative: 0 % (ref 0–1)
Eosinophils Absolute: 0.4 10*3/uL (ref 0.0–0.7)
Eosinophils Relative: 4 % (ref 0–5)
Lymphocytes Relative: 20 % (ref 12–46)
Lymphs Abs: 1.9 10*3/uL (ref 0.7–4.0)
Monocytes Absolute: 1 10*3/uL (ref 0.1–1.0)
Monocytes Relative: 10 % (ref 3–12)
Neutro Abs: 6.2 10*3/uL (ref 1.7–7.7)
Neutrophils Relative %: 65 % (ref 43–77)

## 2011-01-15 ENCOUNTER — Encounter: Payer: Self-pay | Admitting: Critical Care Medicine

## 2011-01-17 ENCOUNTER — Ambulatory Visit (INDEPENDENT_AMBULATORY_CARE_PROVIDER_SITE_OTHER): Payer: Medicare Other | Admitting: Critical Care Medicine

## 2011-01-17 ENCOUNTER — Encounter: Payer: Self-pay | Admitting: Critical Care Medicine

## 2011-01-17 VITALS — BP 136/72 | HR 86 | Temp 98.0°F | Ht 70.0 in | Wt 180.4 lb

## 2011-01-17 DIAGNOSIS — J4489 Other specified chronic obstructive pulmonary disease: Secondary | ICD-10-CM

## 2011-01-17 DIAGNOSIS — J984 Other disorders of lung: Secondary | ICD-10-CM

## 2011-01-17 DIAGNOSIS — J449 Chronic obstructive pulmonary disease, unspecified: Secondary | ICD-10-CM

## 2011-01-17 MED ORDER — PREDNISONE 10 MG PO TABS: ORAL_TABLET | ORAL | Status: DC

## 2011-01-17 NOTE — Assessment & Plan Note (Signed)
COPD exac d/t allergic rhinitis flare Plan Pulse prednisone Cont spiriva

## 2011-01-17 NOTE — Patient Instructions (Signed)
Prednisone 10mg  Take 4 for two days, three for two days, two for two days, then one for two days and stop No change in spiriva Return 6 months

## 2011-01-17 NOTE — Progress Notes (Signed)
Subjective:    Patient ID: [name] [name], male    DOB: 13-Jun-1941, 70 y.o.   MRN: [id number]  HPI This is a  70 y.o.   WM with COPD Golds Stage I   August 16, 2010 10:36 AM  Since 2/11, had one episode in end of 11/11 had to get albuterol  having an issue in early am with dyspnea , now uses proair at bedtime and helps all night, and takes spiriva in AM  No mucus now. No chest pain. No wheeze. No sinus drip. 10/05/2008:  DLCO %Predicted: 74  FEF 25/75 %Predicted: 18  FEV1 %Predicted: 70  FVC %Predicted: 99  Post Spirometry FEF 25/75 %Predicted: 15  Post Spirometry FEV1 %Predicted: 66  Post Spirometry FVC %Predicted: 99  RV %Predicted: 106  TLC %Predicted: 114   01/17/2011 Worse now over one month, with allergy season.  Notes more cough, dry.  Notes some wheezing.  No real chest pain.  No edema in feet.  No real pndrip.  Notes some sneezing.  No sore throat.  Notes some hoarseness and clearing ofthroat. Pt denies any significant sore throat, nasal congestion or excess secretions, fever, chills, sweats, unintended weight loss, pleurtic or exertional chest pain, orthopnea PND, or leg swelling Pt denies any increase in rescue therapy over baseline, denies waking up needing it or having any early am or nocturnal exacerbations of coughing/wheezing/or dyspnea. Pt also denies any obvious fluctuation in symptoms with  weather or environmental change or other alleviating or aggravating factors  Past Medical History  Diagnosis Date  . Prostate cancer   . Chronic airway obstruction, not elsewhere classified   . Other and unspecified hyperlipidemia   . Unspecified essential hypertension   . Atrial fibrillation   . Neuropathy   . Palpitations      Family History  Problem Relation Age of Onset  . COPD Mother   . Heart disease Brother      History   Social History  . Marital Status: Married    Spouse Name: N/A    Number of Children: N/A  . Years of Education: N/A   Occupational  History  . Not on file.   Social History Main Topics  . Smoking status: Former Smoker -- 2.0 packs/day for 40 years    Types: Cigarettes    Quit date: 09/15/1998  . Smokeless tobacco: Never Used  . Alcohol Use: Not on file  . Drug Use: Not on file  . Sexually Active: Not on file   Other Topics Concern  . Not on file   Social History Narrative  . No narrative on file     No Known Allergies   Outpatient Prescriptions Prior to Visit  Medication Sig Dispense Refill  . albuterol (PROAIR HFA) 108 (90 BASE) MCG/ACT inhaler Inhale 2 puffs into the lungs every 6 (six) hours as needed.        Marland Kitchen aspirin 81 MG tablet Twice weekly      . diltiazem (CARDIZEM) 120 MG tablet 120 mg. Take 2 tablets in the morning and 1 in the evening       . fish oil-omega-3 fatty acids 1000 MG capsule Take 1 g by mouth daily.        Marland Kitchen HYDROcodone-acetaminophen (VICODIN) 5-500 MG per tablet Take 1 tablet by mouth every 12 (twelve) hours.       . Multiple Vitamin (MULTIVITAMIN) capsule Take 1 capsule by mouth daily.        . pregabalin (LYRICA) 150  MG capsule Take 150 mg by mouth 2 (two) times daily.       Marland Kitchen tiotropium (SPIRIVA) 18 MCG inhalation capsule Place 18 mcg into inhaler and inhale daily.        . traMADol (ULTRAM) 50 MG tablet Take 50 mg by mouth every 12 (twelve) hours.       . vitamin E 400 UNIT capsule Take 400 Units by mouth daily.            Review of Systems Constitutional:   No  weight loss, night sweats,  Fevers, chills, fatigue, lassitude. HEENT:   No headaches,  Difficulty swallowing,  Tooth/dental problems,  Sore throat,                No sneezing, itching, ear ache, nasal congestion, post nasal drip,   CV:  No chest pain,  Orthopnea, PND, swelling in lower extremities, anasarca, dizziness, palpitations  GI  No heartburn, indigestion, abdominal pain, nausea, vomiting, diarrhea, change in bowel habits, loss of appetite  Resp: Notes  shortness of breath with exertion not  at rest.  No  excess mucus, no productive cough,  Notes  non-productive cough,  No coughing up of blood.  No change in color of mucus.  No wheezing.  No chest wall deformity  Skin: no rash or lesions.  GU: no dysuria, change in color of urine, no urgency or frequency.  No flank pain.  MS:  No joint pain or swelling.  No decreased range of motion.  No back pain.  Psych:  No change in mood or affect. No depression or anxiety.  No memory loss.     Objective:   Physical Exam Filed Vitals:   01/17/11 1107  BP: 136/72  Pulse: 86  Temp: 98 F (36.7 C)  TempSrc: Oral  Height: 5\' 10"  (1.778 m)  Weight: 180 lb 6.4 oz (81.829 kg)  SpO2: 96%    Gen: Pleasant, well-nourished, in no distress,  normal affect  ENT: No lesions,  mouth clear,  oropharynx clear, no postnasal drip  Neck: No JVD, no TMG, no carotid bruits  Lungs: No use of accessory muscles, no dullness to percussion, few expir wheezes  Cardiovascular: RRR, heart sounds normal, no murmur or gallops, no peripheral edema  Abdomen: soft and NT, no HSM,  BS normal  Musculoskeletal: No deformities, no cyanosis or clubbing  Neuro: alert, non focal  Skin: Warm, no lesions or rashes        Assessment & Plan:   COPD COPD exac d/t allergic rhinitis flare Plan Pulse prednisone Cont spiriva    Updated Medication List Outpatient Encounter Prescriptions as of 01/17/2011  Medication Sig Dispense Refill  . albuterol (PROAIR HFA) 108 (90 BASE) MCG/ACT inhaler Inhale 2 puffs into the lungs every 6 (six) hours as needed.        . Ascorbic Acid (VITAMIN C) 500 MG tablet Take 500 mg by mouth daily.        Marland Kitchen aspirin 81 MG tablet Twice weekly      . diltiazem (CARDIZEM) 120 MG tablet 120 mg. Take 2 tablets in the morning and 1 in the evening       . fish oil-omega-3 fatty acids 1000 MG capsule Take 1 g by mouth daily.        Marland Kitchen HYDROcodone-acetaminophen (VICODIN) 5-500 MG per tablet Take 1 tablet by mouth every 12 (twelve) hours.       .  Multiple Vitamin (MULTIVITAMIN) capsule Take 1 capsule by mouth daily.        Marland Kitchen  pregabalin (LYRICA) 150 MG capsule Take 150 mg by mouth 2 (two) times daily.       Marland Kitchen tiotropium (SPIRIVA) 18 MCG inhalation capsule Place 18 mcg into inhaler and inhale daily.        . traMADol (ULTRAM) 50 MG tablet Take 50 mg by mouth every 12 (twelve) hours.       . predniSONE (DELTASONE) 10 MG tablet Take 4 for two days, three for two days, two for two days, then one for two days and stop   20 tablet  0  . DISCONTD: vitamin E 400 UNIT capsule Take 400 Units by mouth daily.

## 2011-01-28 NOTE — Assessment & Plan Note (Signed)
[facility]                       [facility] OFFICE NOTE   NAME:[name], [name] MCKAY                        MRN:          [id number]  DATE:11/11/2007                            DOB:          [birth year]    IDENTIFICATION:  The patient is a 70 year old gentleman whom I last saw  back in October.  He has a history of paroxysmal atrial fibrillation.  He also has a history of emphysema, is followed by Danise Mina in  pulmonary clinic.   When I saw him last year, he had had some spells, feeling his heart race  after flu vaccine and went ahead and got a Holter monitor which was  unremarkable.  No atrial fibrillation.  I also got an echocardiogram  which also was relatively unremarkable.  The patient said the symptoms  have since resolved, and he thinks he may have had a side effect from  the shot.   He takes activity as tolerated.  He exercises about 15 minutes every  day.  Denies any changing in his breathing.  No chest pain, no  palpitations.   MEDICINES:  1. Aspirin 81.  2. Multivitamin.  3. Cardizem 240.  4. Spiriva.  5. Fish oil 1.2 g.  6. Lyrica 200 b.i.d.   PHYSICAL EXAM:  The patient is in no distress.  Blood pressure is 136/90 on arrival, on my check 132/84.  His pulse is  62 and regular.  Weight 190.  This is up from 174 in October.  NECK:  JVP is normal.  No bruits.  LUNGS:  Clear with decreased air flow.  CARDIAC:  Regular rate and rhythm, S1-S2, no S3, no murmurs.  ABDOMEN:  Benign, slightly obese.  EXTREMITIES:  No edema.   IMPRESSION:  1. Paroxysmal atrial fibrillation.  Sounds like he is maintaining      sinus rhythm most of the time.  I would keep him on the same      regimen.  2. Health care maintenance, counseled him on watching his weight also      on staying active and looking at his lipid panel from last year,      his LDL was 108.  I do not have this year;s numbers.  His HDL was a      little low at 36.  I would  recommend over-the-counter Benacol, see      if we could get the LDL down a little bit more.  Encouraged him      again to stay active.   I will set to see him back in December, sooner if problems develop.     Pricilla Riffle, MD, [facility]  Electronically Signed    PVR/MedQ  DD: 11/11/2007  DT: 11/12/2007  Job #: [id number]   cc:   Angus G. Renard Matter, MD

## 2011-01-28 NOTE — Assessment & Plan Note (Signed)
[facility]                       [place] CARDIOLOGY OFFICE NOTE   NAME:[name] [name], [name] [name]                        MRN:          [id number]  DATE:11/24/2008                            DOB:          03-04-1941    CARDIOLOGIST:  Jonelle Sidle, MD.   PRIMARY CARE PHYSICIAN:  Angus G. Renard Matter, MD   REASON FOR VISIT:  A 2-week followup.   HISTORY OF PRESENT ILLNESS:  [name] [name] is a 70 year old male who I have  seen on several occasions recently for palpitations.  Please see my  previous notes for complete details.  When last saw him, he had a  reaction to Toprol.  We took him off of that drug and placed him on  diltiazem ER 240 mg the morning and 120 mg in the evening.  As noted  previously, he began to note more palpitations when he came off of  Cardizem LA and switched to diltiazem secondary to cost savings.  He  feels better in terms of palpitations today.  However, he does note  increasing congestion and cough.  He seems to think this all started  when he received an albuterol treatment in the emergency room back at  the end of February.  His cough is productive of green sputum, at times.  He denies fevers or chills.  He denies any significant change in  shortness of breath.  He notes that he has previously not had  significant cough with his COPD.  He takes Spiriva on a daily basis and  does follow chronically with Dr. Delford Field.   MEDICATIONS:  1. Diltiazem ER 240 mg in the morning and 120 mg in the evening.  2. Fish oil 1200 mg daily.  3. Multivitamin daily.  4. Aspirin 81 mg daily.  5. Gabapentin 400 mg 3 times a day.  6. Spiriva daily.   ALLERGIES:  TOPROL causes a rash.   SOCIAL HISTORY:  He denies any current tobacco abuse.   REVIEW OF SYSTEMS:  Please see HPI.  He has dental infection and has to  undergo extraction, and is currently taking penicillin 500 mg 4 times a  day.   PHYSICAL EXAMINATION:  GENERAL:  He is well nourished and  well  developed, in no distress.  VITAL SIGNS:  Blood pressure is 136/84, pulse 101, and weight 178  pounds.  HEENT:  Normal.  NECK:  Without JVD.  CARDIAC:  S1 and S2.  Regular rate and rhythm.  LUNGS:  With decreased breath sounds bilaterally, dry bibasilar  crackles, otherwise clear.  No wheezing.  ABDOMEN:  Soft and nontender.  EXTREMITIES:  Without edema.  NEUROLOGIC:  He is alert and oriented x3.  Cranial nerves II through XII  are grossly intact.  SKIN:  Warm and dry.   ASSESSMENT AND PLAN:  1. Palpitations secondary to symptomatic sinus tachycardia.  He still      is somewhat tachycardia, but his symptoms have improved quite a bit      on the current therapy.  I have recommend leaving him on his      current dose of  diltiazem for now.  2. Cough.  The patient possibly has a chronic obstructive pulmonary      disease exacerbation.  I have recommended that he start on Avelox      400 mg daily for the next 5 days.  We will also obtain a chest x-      ray and I have recommended he obtain Mucinex to take 600 mg b.i.d.      He can obtain this over-the-counter.  I have also given a      prescription for albuterol to be used on a p.r.n. basis.  If he      feels as though he is worsening over the weekend he should go to      the emergency room.  He understands this.  If he is not improving,      then I  think, he should get back to see Dr. Delford Field who follows him      chronically for COPD.  He will set up an appointment next week if      this is the case.  3. Hypertension.  This seems to be controlled.  4. Hyperlipidemia.  He is currently on fish oil and this is followed      by Dr. Renard Matter.   DISPOSITION:  Follow up with Dr. Diona Browner in 3 months or sooner p.r.n.      Tereso Newcomer, PA-C  Electronically Signed      Jonelle Sidle, MD  Electronically Signed   SW/MedQ  DD: 11/24/2008  DT: 11/25/2008  Job #: [id number]   cc:   Angus G. Renard Matter, MD  Charlcie Cradle Delford Field, MD,  FCCP

## 2011-01-28 NOTE — Op Note (Signed)
NAME:  [name] [name], [name] [name]                 ACCOUNT NO.:  [id number]   MEDICAL RECORD NO.:  [id number]          PATIENT TYPE:  AMB   LOCATION:  [facility]                         FACILITY:  [facility]   PHYSICIAN:  Valetta Fuller, M.D.  DATE OF BIRTH:  03-30-1941   DATE OF PROCEDURE:  DATE OF DISCHARGE:                               OPERATIVE REPORT   PREOPERATIVE DIAGNOSES:  1. Recurrent bladder neck contracture.  2. History of prostate cancer status post seed implantation.   POSTOPERATIVE DIAGNOSES:  1. Recurrent bladder neck contracture.  2. History of prostate cancer status post seed implantation.   PROCEDURE PERFORMED:  Cystoscopy with balloon dilation of bladder neck  contracture and laser incision of bladder neck.   SURGEON:  Valetta Fuller, M.D.   ANESTHESIA:  General.   INDICATIONS:  Mr. Kazmierski is 70 years of age.  The patient underwent seed  implantation approximately 2 years ago.  At that time, he was noted to  have a bladder neck contracture from a previous laser prostatectomy done  elsewhere.  The patient required dilation, but then had recurrent  voiding issues.  Approximately 9 months ago, he underwent formal  dilation of his bladder neck contracture with a laser incision.  The  patient did well, but recently had a substantial increase in voiding  symptoms.  He elected to forego repeat cystoscopy in the office and  wanted reassessment with definitive treatment, if possible, under  anesthesia.  There was no evidence of prostatitis.  The patient now  presents for a formal reassessment and treatment of what is  presumptively a recurrent bladder neck contracture.   TECHNIQUE AND FINDINGS:  The patient was brought to the operating room.  He had successful induction of general anesthesia.  He was prepped and  draped in the normal manner and placed in a mid lithotomy position.  He  received perioperative Cipro.  Appropriate patient identification and  operative time-out were performed.   Cystoscopy revealed unremarkable  anterior urethra.  Within the prostatic urethra, there were some small  calcifications adherent to the mucosa.  There was what appeared to be a  web of tissue between the lateral lobes of the prostate and recurrent  bladder neck contracture.  Representative pictures were taken.  A  guidewire was placed into the bladder.  We then utilized a 24-French,  nephrostomy-dilating balloon to dilate the bladder neck contracture for  5 minutes to 15 atmospheres of pressure.  The balloon was then removed.  This resulted in substantial improvement in the visual appearance of the  bladder neck contracture.  Additional small calcifications were noted to  be adherent to the area of the bladder neck but no evidence of foreign  body was present.  With the cystoscope, we used a holmium laser fiber at  the 6 o'clock position to further incise the bladder neck contracture  resulting in further  opening of that area.  Hemostasis remained excellent.  The patient had  placement of a Foley catheter, lidocaine jelly and a B and O  suppository.  He had no obvious complications or problems and  was  brought to recovery room in stable condition.      Valetta Fuller, M.D.  Electronically Signed     DSG/MEDQ  D:  01/24/2009  T:  01/24/2009  Job:  [id number]

## 2011-01-28 NOTE — Assessment & Plan Note (Signed)
[facility]                       [place] CARDIOLOGY OFFICE NOTE   NAME:Wurzel, SAFAL HALDERMAN                        MRN:          [id number]  DATE:11/16/2008                            DOB:          25-Mar-1941    CARDIOLOGIST:  Jonelle Sidle, MD   PRIMARY CARE PHYSICIAN:  Angus G. Renard Matter, MD   REASON FOR VISIT:  Palpitations.   HISTORY OF PRESENT ILLNESS:  Mr. Sippel is a 70 year old male with a  history of paroxysmal atrial fibrillation, as well as significant COPD.  He is followed chronically by Dr. Shan Levans.  I saw him recently  after he visited the emergency room with tachy palpitations.  Please see  my note from November 13, 2008, for complete details.  I saw the patient  with Dr. Dietrich Pates that day and Dr. Dietrich Pates recommended placing him on  Toprol.  The patient states he took Toprol 25 mg for 2 days.  He  developed a rash on his trunk, on his arms, and felt like his heart had  slowed down too much.  He also notes that he is quite  lethargic/fatigued.  He stopped taking his medication after speaking to  one of our nurses yesterday.  In the office today, he notes continued  palpitations like he has had previously.  He feels that his heart goes  too rapid.  There has really been no significant change in his  palpitations.  He denies any chest pain.  He has chronic dyspnea on  exertion without significant change.  Denies orthopnea, PND, pedal  edema.  Denies any fevers or chills.  Denies any significant cough.  When he was recently in the emergency room, he had blood work that  demonstrated normal hemoglobin, normal creatinine.  He also had a chest  x-ray that showed no active disease.   MEDICATIONS:  Unchanged since his last visit.   PHYSICAL EXAMINATION:  GENERAL:  He is a well-nourished, well-developed  male in no acute distress.  VITAL SIGNS:  Blood pressure is 139/80, pulse 111, weight 177 pounds.  HEENT:  Normal neck without JVD.  CARDIAC:  Normal S1 and S2.  Regular rate and rhythm.  LUNGS:  Clear to auscultation bilaterally.  ABDOMEN:  Soft, nontender.  EXTREMITIES:  Without edema.  NEUROLOGIC:  He is alert and oriented x3.  Cranial nerves II through XII  grossly intact.  Electrocardiogram reveals sinus tachycardia with heart  rate of 101, normal axis, no acute changes.   ASSESSMENT/PLAN:  Symptomatic sinus tachycardia.  I have had a long  discussion with the patient regarding his symptoms.  I think his sinus  tachycardia is probably secondary to a combination of multiple things.  First, I think it is related to his progressively worsening chronic  obstructive pulmonary disease.  Secondly, he had a nebulizer treatment  in the emergency room with albuterol, as well as IV Solu-Medrol at the  beginning of this week.  Thirdly, he recently switched from brand name  Cardizem LA to diltiazem to ease cost.  He started taking that drug  sometime in January.  I suspect that  the generic formulation is  different for him and he is quite sensitive with increasing heart rates.  He has had a rash now to Toprol.  He will discontinue that drug  altogether.  I also explained to him he is probably feeling lethargic  from the beta-blocker.  After a long discussion with the patient, I have  decided to change his diltiazem CD to 240 mg in the morning and 120 mg  in the evening.  Hopefully, he will achieve some benefit from this.  He  still has some Cardizem LA at home.  We could always try to switch him  back to that to see if he has any symptomatic relief.  He will follow up  as scheduled in the next couple of weeks or sooner p.r.n.      Tereso Newcomer, PA-C  Electronically Signed      Gerrit Friends. Dietrich Pates, MD, [facility]  Electronically Signed   SW/MedQ  DD: 11/16/2008  DT: 11/17/2008  Job #: [id number]   cc:   Angus G. Renard Matter, MD

## 2011-01-28 NOTE — Assessment & Plan Note (Signed)
HEALTHCARE                       [place] CARDIOLOGY OFFICE NOTE   NAME:[name], [name] PATTILLO                        MRN:          [id number]  DATE:06/30/2008                            DOB:          02/19/1941    PRIMARY CARE PHYSICIAN:  Angus G. Renard Matter, MD   PULMONOLOGIST:  Charlcie Cradle. Delford Field, MD, FCCP   REASON FOR VISIT:  Scheduled followup.   HISTORY OF PRESENT ILLNESS:  This is my first meeting with [name] [name].  He is a very pleasant 71 year old gentleman with a history of paroxysmal  atrial fibrillation, last seen by Dr. Tenny Craw in February 2009.  He has  chronic obstructive pulmonary disease and also pulmonary nodules that  are followed closely by Dr. Delford Field, with serial CT scanning and the PET  scanning over the years.  My understanding is that this process has been  stable.  He reports occasional palpitations, but nothing prolonged and  he is not having any significant angina.  He has dyspnea, presently NYHA  class II severity.  Today his electrocardiogram shows sinus rhythm with  an RSR prime pattern in lead V1 and V2 and otherwise nonspecific ST  changes.  At this particular time, his CHADS2 score is 1 based on review  of his blood pressure.  He has not been on Coumadin at any point and  continues on low-dose aspirin at this time.   ALLERGIES:  No known drug allergies.   PRESENT MEDICATIONS:  1. Aspirin 81 mg p.o. daily.  2. Multivitamin 1 p.o. daily.  3. Cardizem LA 240 mg p.o. daily.  4. Spiriva 1 caplet daily.  5. Omega 3 supplements 1200 mg daily.  6. Lyrica 200 mg p.o. b.i.d.   REVIEW OF SYSTEMS:  As described in the history of present illness.  Otherwise negative.   PHYSICAL EXAMINATION:  VITAL SIGNS:  Blood pressure today is 137/85,  heart rate is 83 and regular, weight is 107 pounds down 3 pounds from  his last visit.  GENERAL:  Normally nourished appearing male, in no acute distress.  NECK:  No elevated jugular venous pressure.   No loud bruits.  No  thyromegaly.  LUNGS:  Clear with diminished somewhat coarse breath sounds.  No  wheezing noted.  CARDIAC:  Regular rate and rhythm.  No pericardial rub, S3 gallop, or  pathologic murmur.  EXTREMITIES:  No frank pitting edema.   IMPRESSION AND RECOMMENDATIONS:  1. Previously documented paroxysmal atrial fibrillation.  [name] [name]      complains of only occasional palpitations and is in sinus rhythm      today.  The only change I plan in his medical regimen is a switch      from Cardizem LA to diltiazem CD, at same dose, to save him some      money with a generic formulation.  If he does not tolerate this, he      will let us know.  Otherwise, he will stay on aspirin at present      and we will plan to see him back on an annual basis.  2. Hyperlipidemia, followed by  Dr. Megan Mans.  3. History of chronic obstructive pulmonary disease and pulmonary      nodules, followed by Dr. Delford Field.     Jonelle Sidle, MD  Electronically Signed    SGM/MedQ  DD: 06/30/2008  DT: 07/01/2008  Job #: [id number]   cc:   Angus G. Renard Matter, MD  Charlcie Cradle Delford Field, MD, FCCP

## 2011-01-28 NOTE — Procedures (Signed)
NAME:  DAIMION, ADAMCIK                 ACCOUNT NO.:  [id number]   MEDICAL RECORD NO.:  [id number]          PATIENT TYPE:  OUT   LOCATION:  [facility]                           FACILITY:  [facility]   PHYSICIAN:  Gerrit Friends. Dietrich Pates, MD, FACCDATE OF BIRTH:  [birth year]   DATE OF PROCEDURE:  07/14/2007  DATE OF DISCHARGE:                                ECHOCARDIOGRAM   REFERRING PHYSICIAN:  Angus G. Renard Matter, MD/Paula Ronnell Freshwater, MD, [facility]   CLINICAL DATA:  A 70 year old gentleman with dyspnea and atrial  fibrillation.  History of COPD.   FINDINGS:  1. Technically quite difficult and limited echocardiographic study.      Diagnostic images were obtained only from the subxiphoid transducer      position and somewhat from the apex.  2. Left atrial size is at the upper limit of normal.  Normal right      atrial size.  3. The right ventricle is normal in size; mild to moderate hypertrophy      is present; systolic function appears normal.  4. Normal mitral valve with annular calcification present.  5. Aortic valve is mildly sclerotic; no stenosis nor insufficiency      identified.  6. Tricuspid valve is poorly imaged; no abnormalities detected.  7. Limited images of the pulmonic valve and proximal pulmonary artery      are grossly normal.  8. Normal IVC.  9. Left ventricular images are suboptimal.  Size and overall function      appear normal; no segmental wall motion abnormalities identified.      Wall thicknesses is at the upper limit of normal to mildly      increased.  10.Comparison to prior study performed July 22, 2004, with improved      technical quality; no interval change apparent.      Gerrit Friends. Dietrich Pates, MD, [facility]  Electronically Signed     RMR/MEDQ  D:  07/14/2007  T:  07/15/2007  Job:  [id number]

## 2011-01-28 NOTE — Assessment & Plan Note (Signed)
[name] [name]                       [place] CARDIOLOGY OFFICE NOTE   NAME:[name] [name], [name] [name]                        MRN:          [id number]  DATE:11/13/2008                            DOB:          1941-08-06    CARDIOLOGIST:  Jonelle Sidle, MD   PRIMARY CARE PHYSICIAN:  Angus G. Renard Matter, MD   PULMONOLOGIST:  Charlcie Cradle. Delford Field, MD, FCCP   REASON FOR VISIT:  Post-ER visit.   HISTORY OF PRESENT ILLNESS:  [name] [name] is a 70 year old male with a  history of paroxysmal atrial fibrillation as well as significant COPD,  who was initially seen by Dr. Diona Browner on June 30, 2008.  At that  time, he was switched from Cardizem LA to diltiazem CD secondary to cost  issues.  The patient actually made that switch in the beginning of  January when he ran out of his Cardizem LA.  Yesterday, he awoke with  complaints of palpitations.  He feels his heart rate was going very  fast.  He noted some very mild chest tightness with this as well as  nausea.  He denies any radiation to his arm or jaw.  Denies any syncope  or near syncope.  Denies any lightheadedness.  He eventually went to the  emergency room at [facility] and was apparently treated with  albuterol nebulizer secondary to possible bronchitis.  When he received  this, his heart rate went even faster and he was more symptomatic.  He  also received Solu-Medrol IV.  With his elevated heart rate, the ER  physician eventually got in touch with Dr. Shirlee Latch who was on call for  Cardiology.  The ER doctor was instructed to give the patient diltiazem  IV x2 doses.  The patient says his heart rate came down with this and  his symptoms resolved.  This morning, he worked in his yard and noted  that his heart rate felt much better and he denies any exertional chest  tightness with this.  He notes chronic exertional shortness of breath.  He describes NYHA class II symptoms.  He denies any recent fever, cough,  or significant wheezing.  He denies orthopnea, PND, or pedal edema.   CURRENT MEDICATIONS:  1. Diltiazem ER 240 mg daily.  2. Fish oil 1200 mg daily.  3. Multivitamin daily.  4. Aspirin 81 mg every other day.  5. Gabapentin 400 mg 3 times a day.  6. Spiriva daily.   PHYSICAL EXAMINATION:  GENERAL:  He is a well-nourished, well-developed  male, in no distress.  VITAL SIGNS:  Blood pressure is 150/74, pulse 109, and weight 182  pounds.  HEENT:  Normal.  NECK:  Without JVD.  LYMPH:  No lymphadenopathy.  CARDIAC:  Normal S1 and S2.  Regular rate and rhythm with 2/6 systolic  ejection murmur, best heard on the left sternal border.  LUNGS:  Clear to auscultation bilaterally.  ABDOMEN:  Soft and nontender.  EXTREMITIES:  Without edema.  NEUROLOGIC:  He is alert and oriented x3.  Cranial nerves II-XII grossly  intact.   Electrocardiogram reveals sinus tachycardia with a  heart rate of 109,  normal axis, no acute changes.   ASSESSMENT/PLAN:  1. Symptomatic sinus tachycardia.  The patient had lab work this      morning at the emergency room.  His hemoglobin was 16.5, potassium      3.6, and creatinine 1.04 and point-of-care markers were negative      x1.  He had a chest x-ray also that demonstrated no active disease,      mild right basilar atelectasis.  He is concerned that the change in      his diltiazem may have effected his heart rate.  He previously had      a different type of palpitation when he had his paroxysmal atrial      fibrillation many years ago.  At this point, we will proceed with      checking his TSH.  I discussed the case further with Dr. Dietrich Pates      who also saw the patient.  We will start him on Toprol-XL 50 mg 1/2      tablet a day for a week.  If he does not develop any significant      wheezing or side effects from this, he can increase to a whole      tablet a day.  2. Systolic murmur.  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      His echocardiogram  sounds somewhat suspicious for aortic stenosis.      Dr. Dietrich Pates also examined the patient today.  We plan to pursue      followup echocardiography in 1 year.   DISPOSITION:  Follow up with Dr. Diona Browner in 2 months or sooner p.r.n.      Tereso Newcomer, PA-C  Electronically Signed      Gerrit Friends. Dietrich Pates, MD, [facility]  Electronically Signed   SW/MedQ  DD: 11/13/2008  DT: 11/14/2008  Job #: [id number]   cc:   Angus G. Renard Matter, MD

## 2011-01-28 NOTE — Assessment & Plan Note (Signed)
[facility]                       [place] CARDIOLOGY OFFICE NOTE   NAME:[name] [name]                        MRN:          [id number]  DATE:07/12/2007                            DOB:          1940/12/14    IDENTIFICATION:  [name] is a 70 year old gentleman with a history of  coronary artery disease, pulmonary nodules, paroxysmal atrial  fibrillation. He was last seen in Cardiology Clinic by Dionicio Stall in  December 2006.   In the interval, he reports that he has been doing okay until a few  weeks ago when he got the pneumonia and flu vaccine and he said a few  days after that he began having shortness of breath, wheezing, his heart  became racing. He said the racing comes and goes, but it happens every  day. Prior to October, his heart had been beating slow.   He says his wheezing seems to be getting better. He denies fevers. No  productive cough. No chest pain.   CURRENT MEDICATIONS:  1. Aspirin 81.  2. Multivitamin daily.  3. Cardizem CD 240.  4. Lyrica.  5. Spiriva.   PHYSICAL EXAMINATION:  On examination, the patient is in no distress.  Blood pressure 132/88, pulse 88 and regular, weight 174.  LUNGS:  Show some decreased airflow. Very faint wheeze.  CARDIAC: Regular rate and rhythm. S1, S2. No S3. No significant murmurs.  ABDOMEN: Benign. No hepatomegaly.  EXTREMITIES: No edema, 2+ pulses.   A 12 lead EKG shows normal sinus rhythm at 81 beats per minute.  Incomplete right bundle branch block. Nonspecific ST changes.   IMPRESSION:  1. Shortness of breath. The patient does have a pulmonary history, I      am not sure what the above exacerbation represents, but he is      having more racing. I would recommend getting an echocardiogram and      also setting up the patient for 48 hour Holter. I will be in touch      with him once I have seen the results.  2. Chronic obstructive pulmonary disease. Encouraged him to followup      with  Danise Mina as it has been a while.   I will set followup for four months, but will be in touch with the  patient regarding results and where to proceed.     Pricilla Riffle, MD, [facility]  Electronically Signed    PVR/MedQ  DD: 07/12/2007  DT: 07/12/2007  Job #: [id number]   cc:   Angus G. Renard Matter, MD  Charlcie Cradle Delford Field, MD, FCCP

## 2011-01-28 NOTE — Op Note (Signed)
NAME:  [name] [name], [name] [name]                 ACCOUNT NO.:  [id number]   MEDICAL RECORD NO.:  [id number]          PATIENT TYPE:  AMB   LOCATION:  [facility]                         FACILITY:  [facility]   PHYSICIAN:  Valetta Fuller, M.D.  DATE OF BIRTH:  31-Mar-1941   DATE OF PROCEDURE:  03/24/2007  DATE OF DISCHARGE:                               OPERATIVE REPORT   PREOPERATIVE DIAGNOSIS:  1. Bladder neck contracture.  2. History of adenocarcinoma of the prostate, status post seed      implantation.   POSTOPERATIVE DIAGNOSIS:  1. Bladder neck contracture.  2. History of adenocarcinoma of the prostate, status post seed      implantation.   PROCEDURE PERFORMED:  Cystoscopy with balloon dilation of bladder neck  contracture and holmium laser incision of bladder neck contracture.   SURGEON:  Dr. Barron Alvine.   ANESTHESIA:  General.   INDICATIONS:  The patient is a 70 year old male.  The patient underwent  seed implantation for clinical stage T1c adenocarcinoma of the prostate  back in March of this year.  The patient had had his original diagnosis  established by an outside urologist.  The patient had had some  longstanding voiding symptoms and has undergone a laser prostatectomy  with a reasonable results.  Going into his seed implantation, the  patient was recently happy with his voiding status and his AUA symptom  score was in the 4 to 6 range.  At the time of his seed implantation,  nursing staff was unable to place a Foley catheter, nor were we able to  do so.  We did flexible cystoscopy and found him to have a nicely open  prostatic fossa but a very tight bladder neck contracture.  We went  ahead and balloon dilated him at that time.  We then put in a Foley  catheter without difficulty and the rest of this procedure went well.  He came back for follow-up and was doing quite nicely.  The patient  tells me, however, he has had some significant progressive voiding  symptoms and presented  yesterday having a marked increased difficulty  with voiding.  He had substantial increases in frequency, urgency, as  well as a lot of hesitancy and a very weak stream.  It was clear that he  was not emptying well.  We felt that he needed to have reassessment of  what was probably going to be a recurrent bladder neck contracture.  We  talked about several options for management and elected to go ahead with  dilation and then probably an incision utilizing a laser to try to open  things up.  He understands that recurrence is a possibility with this  kind of pathology.  He appeared to understand the advantages and  disadvantages of a surgical approach and presents now for the procedure.   OPERATIVE TECHNIQUE AND FINDINGS:  The patient was brought to the  operating room where he had successful induction of general anesthesia.  He was placed in lithotomy position and prepped and draped in the usual  manner.  Cystoscopy revealed unremarkable anterior urethra  with normal  sphincter.  The prostatic fossa was reasonably opened with actually  minimal lateral lobe tissue.  He had a very prominent high-riding  bladder neck/median bar along with a very tight bladder neck contracture  estimated at probably about 8-French.  Visually we placed a guidewire  through this area and then used a nephrostomy tube, 8 mm, 24-French  dilating balloon to dilate the bladder neck contracture for 5 minutes.  At the completion of this, things were much more open but I still felt  that he needed additional work done.  That reason we used a 500 micron  holmium laser fiber and made incisions at the 5 and 7 o'clock position  in his bladder neck which really opened up things visually very nicely.  Hemostasis was quite excellent.  No other pathology was noted.  No  evidence of any seeds.  A 20-French Foley catheter was then placed and  urine was light pink in color.  The patient appeared to tolerate the  procedure well.   There were no obvious complications or problems.  He  was brought to recovery room in stable condition.           ______________________________  Valetta Fuller, M.D.  Electronically Signed     DSG/MEDQ  D:  03/24/2007  T:  03/25/2007  Job:  [id number]   cc:   Maryln Gottron, M.D.  Fax: [phone]

## 2011-01-28 NOTE — Op Note (Signed)
NAME:  [name] [name], [name] [name]                 ACCOUNT NO.:  [id number]   MEDICAL RECORD NO.:  [id number]          PATIENT TYPE:  AMB   LOCATION:  [facility]                         FACILITY:  [facility]   PHYSICIAN:  Valetta Fuller, M.D.  DATE OF BIRTH:  01-13-1941   DATE OF PROCEDURE:  04/24/2008  DATE OF DISCHARGE:                               OPERATIVE REPORT   PREOPERATIVE DIAGNOSES:  1. Bladder neck contracture.  2. History of adenocarcinoma prostate.  3. Bladder neck obstruction and benign prostatic hypertrophy.   POSTOPERATIVE DIAGNOSES:  1. Bladder neck contracture.  2. History of adenocarcinoma prostate.  3. Bladder neck obstruction and benign prostatic hypertrophy.   PROCEDURE PERFORMED:  1. Cystoscopy.  2,  Balloon dilation of bladder neck contracture.  1. Limited TUR of bladder neck contracture.   SURGEON:  Grapey   ANESTHESIA:  General.   INDICATIONS:  [name] [name] is a 70 year old male.  He had been diagnosed  with favorable clinical stage T1c adenocarcinoma of the prostate and was  initially observed by an outside urologist.  The patient had also  undergone a laser prostatectomy in the past.  The patient subsequently  had a seed implantation done in March 2008.  Dosimetry was encouraging.  At the time of his procedure, the patient had a substantial bladder neck  contracture noted which did not allow for insertion of the Foley  catheter.  The patient ended up having a dilation at the time of his  seed implantation.  Approximately a year ago after his voiding worsened,  he ended up having a balloon dilation, holmium incision of his bladder  neck.  The patient continued to be relatively satisfied with things but  noticed a substantial change in his voiding recently.  PSA data has been  encouraging in that there has been no evidence of any obvious  progression of his prostate cancer.  We talked about several options  with him and felt that additional reassessment of his bladder neck  and  prostatic urethra was indicated.  He was in favor of going ahead with  the procedure, and we discussed the benefits and risks of this.  Full  informed consent has been obtained.   TECHNIQUE AND FINDINGS:  The patient was brought to the operating room  where he had successful induction of general anesthesia.  He was placed  in lithotomy position and prepped and draped in the usual manner.  He  did receive perioperative antibiotics.  Cystoscopy revealed unremarkable  anterior urethra.  Within the prostatic fossa, the patient did have some  lateral lobe hyperplasia but a very high-riding median bar and moderate  bladder neck contracture.  I was able to get a guidewire through this  area and into the bladder.  With fluoroscopic guidance, we placed a 24-  French fascia dilating balloon through this area and with fluoroscopic  control, I dilated the balloon for 5 minutes.  The balloon was then  removed.  We utilized the [employer] to then gently dilate him to  28-French.  A continuous flow resectoscope sheath was then placed.  Inspection  still revealed some narrowing in his bladder neck region.  For that reason, we went ahead and did a TUR of the bladder neck and  median bar of the prostate, opening this area nicely.  Approximately 6-8  chips of prostate tissue were taken and sent for pathologic analysis.  One I-125 seed implant also became exposed and was removed and given to  radiation oncology department who were called.  At the completion of the  procedure, visually things were much more open.  Hemostasis was  excellent.  A 22-French Foley catheter was placed without difficulty and  bladder was drained with clear urine.  He is brought to recovery room in  stable condition.      Valetta Fuller, M.D.  Electronically Signed     DSG/MEDQ  D:  04/24/2008  T:  04/24/2008  Job:  [id number]

## 2011-01-31 NOTE — Procedures (Signed)
NAME:  [name] [name], [name] [name]                 ACCOUNT NO.:  [id number]   MEDICAL RECORD NO.:  [id number]          PATIENT TYPE:  INP   LOCATION:  A226                          FACILITY:  [facility]   PHYSICIAN:  Vida Roller, M.D.   DATE OF BIRTH:  05/24/1941   DATE OF PROCEDURE:  07/23/2004  DATE OF DISCHARGE:                                    STRESS TEST   HISTORY:  [name] is a pleasant 70 year old male who was admitted to [facility] on July 20, 2004, for evaluation of palpitations.  He has a  history of hypertension.  There is a history of hyperlipidemia  there is a  history of chronic obstructive pulmonary disease with remote tobacco  history.  The patient had a 2-D echocardiogram performed on July 22, 2004, that was essentially normal with an ejection fraction of 60 to 65%.  He was scheduled for an exercise Cardiolite to further rule out the  possibility of coronary artery disease.   Prior to the study, the patient reported that he did have some palpitations  while under the camera prior to the stress portion.  He has had no  significant chest discomfort.   Baseline EKG shows sinus rhythm, rate 71 beats per minute, baseline blood  pressure was 142/88, target heart rate was 133.   The patient was able to exercise a total of 7 minutes and 14 seconds,  reaching a maximum heart rate of 139 beats per minute.  Myoview was injected  at 5 minutes and 55 seconds into the study.  The patient did experience some  shortness of breath and some leg weakness.  Maximum blood pressure during  this study was 192/78.  He had occasional PVC's on the monitor.  He had  slight inferior ST changes late in recovery that eventually resolved.  The  final images are pending at time of this dictation.     Markus.Osmond   DR/MEDQ  D:  07/23/2004  T:  07/23/2004  Job:  [id number]   cc:   Angus G. Renard Matter, MD  [address]  [place]  [place] [address]  Fax: [phone]

## 2011-01-31 NOTE — Discharge Summary (Signed)
NAME:  Strickler, Haron                 ACCOUNT NO.:  [id number]   MEDICAL RECORD NO.:  [id number]          PATIENT TYPE:  INP   LOCATION:  A226                          FACILITY:  [facility]   PHYSICIAN:  Angus G. Renard Matter, MD   DATE OF BIRTH:  02-25-1941   DATE OF ADMISSION:  07/20/2004  DATE OF DISCHARGE:  11/08/2005LH                                 DISCHARGE SUMMARY   CONTINUATION:   HOSPITAL COURSE:  The patient, at the time of his admission, was placed on  D5 half-normal saline at 100 cc an hour.  He was given aspirin 325 mg and  placed on Lovenox 1 mg subcutaneously q.12h.  He was placed on IV  nitroglycerin, p.r.n. Tylenol, Darvocet.  He was seen in consultation by  [facility].  Exercise myocardial perfusion study was scheduled, and  apparently this was within normal limits.  The patient did have a chest CT  which showed nodular areas in the lower lung field of undetermined etiology.  The patient showed progressive improvement throughout his hospital stay and  was able to be discharged after three days hospitalization.   DISCHARGE MEDICATIONS:  1.  Toprol-XL 50 mg daily.  2.  Aspirin 325 mg p.o. daily.  3.  Advicor 500/21 daily.     Angu   AGM/MEDQ  D:  08/01/2004  T:  08/01/2004  Job:  [id number]

## 2011-01-31 NOTE — Op Note (Signed)
NAME:  [name] [name], [name] [name]                 ACCOUNT NO.:  [id number]   MEDICAL RECORD NO.:  [id number]          PATIENT TYPE:  OBV   LOCATION:  A329                          FACILITY:  [facility]   PHYSICIAN:  Ky Barban, M.D.DATE OF BIRTH:  06/17/1941   DATE OF PROCEDURE:  09/26/2005  DATE OF DISCHARGE:                                 OPERATIVE REPORT   PREOPERATIVE DIAGNOSIS:  Carcinoma of the prostate with bladder neck  obstruction.   POSTOPERATIVE DIAGNOSIS:  Carcinoma of the prostate with bladder neck  obstruction.   PROCEDURE:  Holmium laser ablation of the prostate with transurethral  resection of the prostate, limited.   ANESTHESIA:  Spinal.   PROCEDURE:  The patient under spinal anesthesia placed in lithotomy  position, usual prep and drape. A #24 Iglesias resectoscope was introduced  into the bladder, then introduced laser scope through it. The bladder was  inspected. The scope is pulled back into the mid prostatic urethra. Then  using laser fiber, the ablation of the bladder was started. First, I ablated  the bladder neck at 6 o'clock position because __________. Then the bladder  neck was circumferentially ablated. Resectoscope was pulled back at the  level of the verumontanum, rotated to the 11 o'clock position and right lobe  of the prostate was ablated between 11 and 12 o'clock position. Similarly,  left lobe was ablated between 1 and 5 o'clock position. There is a lot of  whitish, cotton-like material hanging in the prostatic urethra but looks  like the obstructing tissue has been ablated. So at this point, I decided to  introduce the 28 Iglesias resectoscope, and I scraped off and cut some of  these necrotic tissue with the help of the cautery. Looking at the end, the  prostatic urethra looks wide open. There were a few chips that were  evacuated. There is no bleeding going on. Resectoscope was removed. A #20  Foley catheter left in for drainage. The patient left the  operating room in  satisfactory condition.      Ky Barban, M.D.  Electronically Signed     MIJ/MEDQ  D:  09/26/2005  T:  09/26/2005  Job:  [id number]

## 2011-01-31 NOTE — Discharge Summary (Addendum)
NAME:  [name] [name], [name] [name]                 ACCOUNT NO.:  [id number]   MEDICAL RECORD NO.:  [id number]          PATIENT TYPE:  INP   LOCATION:  A226                          FACILITY:  [facility]   PHYSICIAN:  Angus G. Renard Matter, MD   DATE OF BIRTH:  [birth year]   DATE OF ADMISSION:  07/20/2004  DATE OF DISCHARGE:  11/08/2005LH                                 DISCHARGE SUMMARY   DIAGNOSES:  1.  Palpitations.  2. Paroxysmal atrial fibrillation.  3. Hyperlipidemia.      4. Chronic obstructive pulmonary disease.   SUBJECTIVE:  The patient continues stable and improved at the time of  discharge.  A 70 year old white male with a history of hyperlipidemia and  COPD.  He experienced intermittent fluttering of his heart associated with  bilateral arm weakness and mild diaphoresis.  He was seen in the ER and  found to be in sinus rhythm.  Presumed to have episode of paroxysmal atrial  fibrillation which converted to sinus rhythm.  He was placed on IV  nitroglycerin.   PHYSICAL EXAMINATION:  VITAL SIGNS:  Blood pressure 134/90, pulse oximetry  98%, respirations 20, pulse 102.  HEENT:  Eyes:  PERRL.  a____ QA MARKER: 167 ____ Oroph  NECK:  Supple.  No JVD or carotid abnormalities.  LUNGS:  Prolonged expiratory phase.  No rales, wheezes or rhonchi.  HEART:  Regular rhythm.  No murmurs.  ABDOMEN:  No palpable organs or masses.  EXTREMITIES:  Free of edema.  NEUROLOGIC:  No focal deficit.   LABORATORY DATA:  Admission CBC WBC 8000 with hemoglobin 16.7, hematocrit  46.8, prothrombin time PT 12.7 seconds, INR 0.9.  Chemistries sodium 136,  potassium 3.7, chloride 104, CO2 26, glucose 104, BUN 11, creatinine 1.1,  calcium 9.6, total protein 7.4, albumin 4.2.  Liver enzymes SGOT 26, SGPT  33, alkaline phosphatase 62, bilirubin 1.7, CK 70, CK-MB 1.3, subsequent CK  148, CK-MB 2.4, ____ QA MARKER: 234 ____ index  myoglobin 134, 108, 94.4.  Chest x-ray questionable small nodular density  left base.  Subsequent  chest x-ray no definite nodules seen.  Follow up CT  recommended to exclude pulmonary nodule.  CT of chest reveals scattered  pulmonary nodules some of which were not calcified.  Questionable  granulomatous type changes versus malignancy.  Chronic lung changes.  Atherosclerotic changes.  Heart normal sinus rhythm.  Left atrial  enlargement.   HOSPITAL COURSE:  The patient at the time of his admission was placed on D5  half normal saline.  Aspirin 325 mg.   DICTATION ENDED HERE     Angu   AGM/MEDQ  D:  08/01/2004  T:  08/01/2004  Job:  [id number]

## 2011-01-31 NOTE — Procedures (Signed)
NAME:  [name] [name], [name] [name]                 ACCOUNT NO.:  [id number]   MEDICAL RECORD NO.:  [id number]          PATIENT TYPE:  INP   LOCATION:  A226                          FACILITY:  [facility]   PHYSICIAN:  Vida Roller, M.D.   DATE OF BIRTH:  12-18-1940   DATE OF PROCEDURE:  07/22/2004  DATE OF DISCHARGE:                                  ECHOCARDIOGRAM   Tape #[id number].  Tape count is [id number].   REASON FOR CONSULTATION:  This is a 70 year old man with palpitations.  No  previous echos.   FINDINGS:  Technical quality of the study is extremely poor.  There were  only limited views available.  Most specifically, the subcostal view  appeared to be the most accurate where there were some apical views which  allowed assessment of all the walls.  M-mode tracings were inadequate.   2-D AND DOPPLER IMAGING:  The left ventricle appears to be normal size with  normal systolic function.  Estimated ejection fraction is 60-65%.  I did not  see any significant wall motion abnormalities.  There appears to be no  significant valvular heart disease.  There is no pericardial effusion.  Beyond that, the study is tremendously limited.  The right ventricle also  appears to be normal size.     Trey Paula   JH/MEDQ  D:  07/22/2004  T:  07/22/2004  Job:  [id number]

## 2011-01-31 NOTE — Consult Note (Signed)
NAME:  [name] [name], [name] [name]                 ACCOUNT NO.:  [id number]   MEDICAL RECORD NO.:  [id number]          PATIENT TYPE:  INP   LOCATION:  A226                          FACILITY:  [facility]   PHYSICIAN:  Vida Roller, M.D.   DATE OF BIRTH:  1941/09/09   DATE OF CONSULTATION:  07/22/2004  DATE OF DISCHARGE:                                   CONSULTATION   HISTORY OF PRESENT ILLNESS:  [name] is a 70 year old man who is a patient  of mine.  I saw him two years ago for some atypical chest discomfort.  He  had an exercise Cardiolite at that time, which was negative for ischemia and  normal left ventricular systolic function.  He now presents on Friday with  an episode of palpitations that were associated with some diaphoresis and  arm heaviness, as well as some unusual feeling in the center of his chest,  which he has a difficult time describing.  They did not occur with exertion.  They lasted about 30-45 minutes and then resolved.  On Friday he took an  aspirin and felt reasonably well afterwards.  He went to work on Friday  evening, he works third shift, and had another episode on Saturday and  reported to the emergency room.  He was evaluated and found to be in normal  sinus rhythm.  He was admitted for observation.  He has since felt fine.  He  has had no recurrence of his symptoms and continues to be in sinus rhythm on  telemetry.   PAST SURGICAL HISTORY:  Unremarkable.   PAST MEDICAL HISTORY:  1.  He has a history of hyperlipidemia.  2.  He has a history of mild emphysema with tobacco abuse.  3.  There is question of mild hypertension, although this has not been well      documented.   SOCIAL HISTORY:  He lives at home with his wife.  He has three grown  daughters.  He works as a Production designer, theatre/television/film on third shift and has been  working a relatively extensive schedule including over time about six days a  week.   MEDICATIONS:  1.  Advicor 1 a day.  2.  Aspirin 325 mg a day.   ALLERGIES:  He is not allergic to any medications.   REVIEW OF SYSTEMS:  In general, negative except for that reviewed in the  history of present illness.  Specifically he has no recent illness.  He has  had no shortness of breath, no productive cough, no PND or orthopnea.  No  lower extremity edema.   PHYSICAL EXAMINATION:  GENERAL:  He is a well-developed well-nourished white  male in no apparent distress.  He is alert and oriented x4 and a very good  historian.  VITAL SIGNS:  His blood pressure is 120/70.  His pulse rate is 63 in sinus.  His respiratory rate is 14.  He is afebrile.  HEENT:  Unremarkable.  NECK:  Supple.  There is no jugular venous distention or no carotid bruits  noted.  His thyroid is a normal  size in the midline.  CHEST:  Clear to auscultation with the exception of some course breath  sounds in the left base.  CARDIAC:  Regular.  The point of maximal impulse is not displaced.  He has  no murmurs.  His first and second heart sounds are normal.  ABDOMEN:  Soft, nontender with no hepatosplenomegaly or bruits noted.  EXTREMITIES:  His lower extremities are without cyanosis, clubbing, or  edema.  His pulses are all 1-2+ throughout without any femoral bruits.  NEUROLOGIC:  Nonfocal.   LABORATORY DATA:  His electrocardiogram, which was performed on 07/21/2004,  shows a sinus rhythm at a rate of 70 with normal intervals and a normal  axis.  He has an RSR prime in lead V1 and V2.  Nonspecific ST-T wave changes  in the anterior lateral leads.  Otherwise an unremarkable EKG.  I have no  old EKGs for comparison.  Laboratories, his white blood cell count is 8000,  H&H of 17 and 47 with a platelet count of 281.  His PT is 12.7, PTT of 30,  INR of 0.9.  Sodium 136, potassium 3.7, chloride 107, bicarbonate 26, BUN  11, creatinine 1.1, and his blood sugar is 104.  His liver function studies  are within normal limits.  He has had three sets of cardiac enzymes and two  point of  care enzymes, which all are inconsistent with acute myocardial  infarction.  His TSH is 1.179, which is normal.  T-4 is 7.1, which is  normal.  Of note is the fact that he has a chest x-ray, which shows a nodule  in the left base, which has not been clearly delineated.   MEDICATIONS IN HOSPITAL:  1.  Aspirin 325 mg.  2.  Toprol-XL 50 mg.  3.  Niacin 500 mg.  4.  Zocor 10 mg.   IMPRESSION:  This is a gentleman who has several issues.  One is the  palpitations associated with some unusual feeling in his chest.  This is  obviously concerning for atrial fibrillation, given his other comorbidities.  However, this rhythm has not been recorded on either electrocardiogram or  telemetry strips.  His thyroid functions are normal.  His echocardiogram is  pending.  With regard to the heaviness in his arms and the chest funny  feeling, this potentially could be an angina equivalent, although it would  be unlikely.  With his normal cardiac enzymes and his nonspecific  electrocardiogram, I think it is probably reasonable to attempt to get some  old electrocardiograms to compare and look at his echocardiogram.  If his  echocardiogram shows that his left ventricular systolic function is normal,  then I think we can proceed with an exercise perfusion study.  If, on the  other hand, the echocardiogram shows depressed left ventricular systolic  function or significant valvular disease, then he will need a heart  catheterization.  His hypertension appears to be well controlled on a single  agent.  His hyperlipidemia, he is pending a lipid panel and his chronic  obstructive pulmonary disease appears to be quiescent.   PLAN:  Echocardiogram.  If the echocardiogram is okay, a myocardial  perfusion imaging study.  We will continue the telemetry.     Trey Paula   JH/MEDQ  D:  07/22/2004  T:  07/22/2004  Job:  [id number]

## 2011-01-31 NOTE — Letter (Signed)
July 16, 2007    Charlcie Cradle. Delford Field, MD, FCCP  [address]  [place], [place] [address]   RE:  SANDRA, TELLEFSEN  MRN:  [id number]  /  DOB:  1941/04/21   Dear Dennie Bible:   Mr. Membreno is a 70 year old gentleman. I saw him earlier this week  (10/27). He has a history of paroxysmal atrial fibrillation, no known  history of coronary artery disease, and emphysema.   The patient said he was doing okay until a few weeks ago when he got  pneumonia and flu vaccine, and then he began feeling short of breath. He  had some wheezing. He felt his heart racing.   When I saw the patient in clinic, he was in sinus rhythm. I set him for  a 48 hour Holter monitor because he said that the heart racing occurred  every day. This only showed sinus rhythm with rates on average below  100.   I also set him up for an echocardiogram that showed overall normal  function. No significant valvular problems.   On exam when I saw him, he had some mild wheezing. I question if his  current symptoms are exacerbation of his lung problems. I am not  completely convinced there is an active cardiac problem going on.   He is going to call your office to set up a clinic appointment. I told  him to contact me after he sees you, so that we can proceed with testing  as needed. Please let me know if you think his pulmonary is stable and I  will proceed with further cardiac testing.    Sincerely,      Pricilla Riffle, MD, [facility]  Electronically Signed    PVR/MedQ  DD: 07/16/2007  DT: 07/18/2007  Job #: [id number]

## 2011-01-31 NOTE — Op Note (Signed)
NAME:  [name] [name], [name] [name]                           ACCOUNT NO.:  [id number]   MEDICAL RECORD NO.:  [id number]                   PATIENT TYPE:  AMB   LOCATION:  DAY                                  FACILITY:  [facility]   PHYSICIAN:  Lionel December, M.D.                 DATE OF BIRTH:  1940-09-24   DATE OF PROCEDURE:  02/14/2003  DATE OF DISCHARGE:                                 OPERATIVE REPORT   PROCEDURE PERFORMED:  Total colonoscopy.   INDICATIONS FOR PROCEDURE:  The patient is a 70 year old Caucasian male with  a history of ulcerative colitis who was first diagnosed in 55.  He is now  in remission.  His last surveillance colonoscopy was three years ago.  He is  virtually asymptomatic.  The procedure is reviewed with the patient.  Informed consent was obtained.   PREMEDICATION:  Demerol 50 mg IV, Versed 5 mg IV.   INSTRUMENT:  Olympus video system.   FINDINGS:  The procedure was performed in the endoscopy suite.  The  patient's vital signs and O2 saturations were monitored during the procedure  and remained stable.  The patient was placed in the left lateral decubitus  position and rectal exam is performed.  No abnormalities are noted on  external or digital exam.  The scope was placed in the rectum and advanced  under vision to the cecum which was identified by the ileocecal valve and  appendiceal orifice.  Pictures were taken for the record.  As the scope was  withdrawn, the colonic mucosa was carefully examined and was normal  throughout.  On the way out, biopsies were taken from the right colon,  transverse colon, descending and sigmoid colon and finally from the rectum.  The scope was retroflexed to examine the anorectal junction.  Hemorrhoids  were noted above the dentate line.  The endoscope was straightened and  withdrawn.  The patient tolerated the procedure well.   FINAL DIAGNOSIS:  Normal examination except a few small diverticula of the  sigmoid colon and internal  hemorrhoids.  Multiple biopsies taken looking for  dysplasia.    RECOMMENDATIONS:  1. High fiber diet.  2. I will contact the patient with the biopsy results.  If biopsies are     negative for dysplasia, I feel he could wait five years before his next     exam but he should continue yearly Hemoccults.  I will contact him with     biopsy results.                                               Lionel December, M.D.    NR/MEDQ  D:  02/14/2003  T:  02/14/2003  Job:  [id number]   cc:   Angus G. McInnis,  M.D.  [address]  [place]  [place] [address]  Fax: [phone]

## 2011-01-31 NOTE — H&P (Signed)
NAME:  [name] [name], [name] [name]                 ACCOUNT NO.:  [id number]   MEDICAL RECORD NO.:  [id number]          PATIENT TYPE:  OBV   LOCATION:  A329                          FACILITY:  [facility]   PHYSICIAN:  Ky Barban, M.D.    DATE OF BIRTH:   DATE OF ADMISSION:  DATE OF DISCHARGE:  LH                                HISTORY & PHYSICAL   CHIEF COMPLAINT:  Difficulty to void.   HISTORY OF PRESENT ILLNESS:  This is a 70 year old gentleman recently  diagnosed with prostate cancer.  He had a Gleason score of 4, and only a  small fraction of one biopsy out of 12 was showing this cancer.  He has  symptoms of prostatism for a long time, and I have managed him with Flomax,  but lately, he has been complaining that he has more difficulty voiding, and  although his prostate is not significantly enlarged, I have advised him to  undergo Holmium ablation of the prostate.  I told him it is just to relieve  his symptoms, but not his cancer.  As far as the prostate cancer is  concerned, we had discussion with the patient and decided to observe him.  He also has erectile dysfunction.  In the past I have treated him with  penile injection.  He continued to have the problem.   PAST MEDICAL HISTORY:  No history of hypertension, diabetes.   FAMILY HISTORY:  Unremarkable.   SOCIAL HISTORY:  He does not smoke or drink.   REVIEW OF SYSTEMS:  Denies any chest pain, orthopnea, PND, nausea, vomiting.   PHYSICAL EXAMINATION:  VITAL SIGNS:  Blood pressure 142/76, temperature  98.8.  CNS:  No gross neurological deficits.  HEENT:  Negative.  CHEST:  Symmetrical.  HEART:  Regular sinus rhythm.  ABDOMEN:  Soft, flat.  Liver, spleen and kidneys not palpable.  No CVA  tenderness.  EXTERNAL GENITALIA:  Circumcised.  Meatus adequate, testicles are normal.  RECTAL:  Sphincter tone is normal.  No rectal mass.  Prostate 1-1/2+, smooth  and firm.   IMPRESSION:  1.  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  2.  Erectile dysfunction.   Note:  I have been treating with Flomax, but he continued to be symptomatic  while on Flomax.  His flow rate is only 4 cc per second, and he has about 80  cc residual urine.  So, he is coming as outpatient to undergo ablation of  the prostate with Holmium.      Ky Barban, M.D.  Electronically Signed     MIJ/MEDQ  D:  09/25/2005  T:  09/25/2005  Job:  [id number]

## 2011-01-31 NOTE — H&P (Signed)
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 This was associated with bilateral arm weakness, some mild  diaphoresis, but he specifically denied anginal chest pain.  He denied  orthopnea, paroxysmal nocturnal dyspnea, dizziness or dyspnea.  He was seen  in the ER, found to be in sinus rhythm and presumed to have an episode of  paroxysmal atrial fibrillation which converted to sinus rhythm. He was  placed on IV nitroglycerin and will subsequently be admitted and worked up  with a 2D echocardiogram and thyroid abnormalities as well as an ischemic  work up.  He had a negative Cardiolite 2 years ago.   PAST SURGICAL HISTORY:  Unremarkable.   PAST MEDICAL HISTORY:  1.  Significant for hyperlipidemia.  2.  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  3.  He has smoked x45 years 1.5 packs per day (quit 6 years ago).   SOCIAL HISTORY:  He is a Estate agent, is married, has 3 grown  daughters.   CURRENT MEDICATIONS:  1.  Avecor 500/20 1 daily.  2.  Aspirin 325 daily which he just took this morning.   PHYSICAL EXAMINATION:  VITAL SIGNS:  Blood pressure is 134/90, pulse  oximetry is 98%, temperature is 97.8, respiratory rate is 20 and pulse is  102 and regular.  HEENT:  Head normocephalic, atraumatic. Eyes - pupils are equal, round and  reactive to light.  Conjunctivae pink.  NECK:  No carotid bruits, no thyromegaly.  LUNGS:  Show prolonged expiratory phase, no rales, wheezes or rhonchi  appreciable.  HEART:  Regular rhythm, no murmurs, gallops, thrills, heaves, or rubs and no  S3, S4 gallops.  ABDOMEN:  Soft, nontender, bowel sounds are normoactive. No guarding,  rebound or mass, no organomegaly.  EXTREMITIES:  No clubbing, cyanosis or edema.  NEUROLOGIC:  Cranial nerves II-XII grossly intact. The patient moves all 4  extremities.   IMPRESSION:  1.  Palpitations presumed paroxysmal atrial fibrillation, currently in sinus      rhythm.  2.  Rule out ischemic component.  3.  Hyperlipidemia.  4.  Chronic pulmonary obstructive disease/emphysema.   PLAN:  Admit for aspirin, continue IV nitroglycerin, Atenolol 50 mg daily,  50 mg now, continue Avecor 500/20 daily, add aspirin 1 daily and Lovenox 1  mg/kg daily.  I will make further recommendations______.  Get a 2D  echocardiogram and thyroid function test.     Rich   RMD/MEDQ  D:  07/20/2004  T:  07/20/2004  Job:  [id number]

## 2011-01-31 NOTE — Group Therapy Note (Signed)
NAME:  [name] [name], [name] [name]                 ACCOUNT NO.:  [id number]   MEDICAL RECORD NO.:  [id number]          PATIENT TYPE:  INP   LOCATION:  A226                          FACILITY:  [facility]   PHYSICIAN:  Angus G. Renard Matter, MD   DATE OF BIRTH:  [birth year]   DATE OF PROCEDURE:  DATE OF DISCHARGE:                                   PROGRESS NOTE   SUBJECTIVE:  This patient was admitted with palpitations.  He does have  chronic obstructive pulmonary disease, and hyperlipidemia.  He remains  relatively asymptomatic.   OBJECTIVE:  Vital signs:  Blood pressure 111/65, respirations 20, pulse 75,  temperature 97.7.  Lungs clear to P&A.  Heart, regular rhythm.  Abdomen, no  palpable organs or masses.   ASSESSMENT:  The patient was admitted with palpitations.  He has a history  of chronic obstructive pulmonary disease, hyperlipidemia.  He is admitted as  well to rule out acute coronary syndrome.   PLAN:  Proceed with myocardial perfusion study today.  Echocardiogram  completed.     Angu   AGM/MEDQ  D:  07/23/2004  T:  07/23/2004  Job:  [id number]

## 2011-01-31 NOTE — Op Note (Signed)
NAME:  [name] [name], [name] [name]                 ACCOUNT NO.:  [id number]   MEDICAL RECORD NO.:  [id number]          PATIENT TYPE:  AMB   LOCATION:  NESC                         FACILITY:  [facility]   PHYSICIAN:  Valetta Fuller, M.D.  DATE OF BIRTH:  May 16, 1941   DATE OF PROCEDURE:  11/26/2006  DATE OF DISCHARGE:                               OPERATIVE REPORT   PREOPERATIVE DIAGNOSIS:  Clinical stage T1C adenocarcinoma prostate.   POSTOPERATIVE DIAGNOSIS:  1. Clinical stage T1C adenocarcinoma prostate.  2. Significant bladder neck contracture.   PROCEDURE PERFORMED:  1. Cystoscopy with balloon dilation of bladder neck contracture and      difficult Foley placement.  2. I-125 seed implantation.   SURGEON:  Valetta Fuller, M.D.   ASSISTANT:  Maryln Gottron, M.D.   ANESTHESIA:  General.   INDICATIONS:  [name] is a 70 year old male who was under the care of  an outside urologist and had had some very long standing voiding issues.  Approximately a year ago, he underwent a laser prostatectomy.  Apparently, the pathology was benign at that time.  The patient's  voiding did improve and he has been relatively satisfied with voiding.  His AUA symptom score is in the 4 to 6 range.  The patient did have a  mildly elevated PSA of around 6 and had an ultrasound and biopsy which  revealed adenocarcinoma of the prostate.  This appeared to be low volume  and well differentiated.  The patient, appropriately, was put on  observation.  His PSA has subsequently increased.  The patient had a PSA  of 6.4 and actually decided to go ahead with definitive therapy.  He  elected to have a I-125 seed implantation done as a monotherapy.  He was  seen by yourself and wanted to proceed with that.  Prior ultrasound  volume studies had suggested a gland in the 35-40 gram range.  The  patient was seen by yourself and underwent extensive consultation.  He  elected to go ahead with seed implantation.  We saw him and  assessed him  approximately a month ago and the patient, again, gave full informed  consent and wanted to proceed with the procedure.  He presents now for  definitive treatment.   TECHNIQUE AND FINDINGS:  The patient was brought to the operating room  where he had successful induction of general anesthesia.  He was placed  in the lithotomy position and prepped and draped in the usual manner.  The nursing staff initially went to put a Foley catheter in and were  unsuccessful.  Dr. Dayton Scrape attempted this, as well, and after it was  unsuccessful called [place] into the OR.  Given his history, we elected to  proceed with flexible cystoscopy and found him to have a relatively open  prostatic fossa but a very narrow and fairly tight bladder neck  contracture.  This appeared to be fairly flimsy, although it was  surprising that the patient has been voiding this well preoperatively.   A guidewire was placed through this area and a balloon dilator was  utilized.  We used an 8 mm balloon up to about 15 atmospheres.  This was  held for five minutes.  At the completion of the procedure, there was no  bleeding and this area was fairly opened.  Repeat flexible cystoscopy  showed a bladder that did show some trabecular change but no other  abnormalities.  Over the guidewire, we placed an 18-[place] Foley  catheter.   Attention was then turned towards interstitial seed implantation.  Again, transrectal ultrasound was placed and anchored to the anchor  table.  Prostate needles for anchoring were utilized.  Dr. Dayton Scrape then  did real time planning with contouring of the prostate, urethra and  rectum.  A dose calculation of 145 gray was established.  Once the plan  was in place, we went ahead and did the seed implantation via the  [employer].  Each needle was placed with direct real  time ultrasound visual guidance in the sagittal plane.  Once the needle  position was confirmed, the  Nucletron device delivered the seeds.  A  total of 19 needles were utilized with a total number of 58 seeds.  The  patient appeared to tolerate the procedure well and there were no  obvious complications.  He was brought to the recovery room in stable  condition.           ______________________________  Valetta Fuller, M.D.  Electronically Signed     DSG/MEDQ  D:  11/26/2006  T:  11/27/2006  Job:  [id number]

## 2011-01-31 NOTE — Assessment & Plan Note (Signed)
[facility]                             PULMONARY OFFICE NOTE   NAME:[name] [name], [name] [name]                        MRN:          [id number]  DATE:08/13/2006                            DOB:          [birth year]    Mr. Scheier is a 70 year old white male with history of chronic lung  disease with previous bilateral pulmonary nodules, a negative PET scan  in December of 2005.  Follow-up CT scan in May of 2006, revealed no  changes.  In the interim he has been maintained on Spiriva daily.  He  had some urinary retention on the Spiriva previously but since having  the prostatic hypertrophy dealt with, his Spiriva has been restarted and  he is doing well on this.   PHYSICAL EXAMINATION:  VITAL SIGNS:  Temperature 98, blood pressure  140/84, pulse 92, saturation was 95% on room air.  CHEST:  Distant breath sounds with prolonged expiratory phase, no wheeze  or rhonchi was noted.  CARDIOVASCULAR:  Regular rate and rhythm without S3, normal S1 and S2.  ABDOMEN:  Soft, nontender.  EXTREMITIES:  No edema or clubbing.  SKIN:  Clear.   IMPRESSION:  Chronic obstructive lung disease with primary emphysematous  component.   PLAN:  Maintain Spiriva daily.  We will obtain a full set of pulmonary  functions to reassess pulmonary status.  Will also obtain a CT scan of  the chest to follow up the pulmonary nodules.  We will see the patient  back in follow-up pending results of this.     Charlcie Cradle Delford Field, MD, [facility]  Electronically Signed    PEW/MedQ  DD: 08/13/2006  DT: 08/14/2006  Job #: [id number]   cc:   Angus G. Renard Matter, MD

## 2011-01-31 NOTE — Group Therapy Note (Signed)
NAME:  [name] [name], [name] [name]                 ACCOUNT NO.:  [id number]   MEDICAL RECORD NO.:  [id number]          PATIENT TYPE:  INP   LOCATION:  A226                          FACILITY:  [facility]   PHYSICIAN:  Angus G. Renard Matter, MD   DATE OF BIRTH:  1941/08/26   DATE OF PROCEDURE:  DATE OF DISCHARGE:                                   PROGRESS NOTE   SUBJECTIVE:  This patient was admitted following an episode of palpitations.  There was questionable paroxysmal atrial fibrillation.  He remains in sinus  rhythm and relatively asymptomatic.  His cardiac enzymes have remained  normal.   PHYSICAL EXAMINATION:  VITAL SIGNS:  Blood pressure 100/63, respirations 17,  pulse 78, temperature 97.3.  LUNGS:  Clear to percussion and auscultation.  HEART:  Regular rhythm.  ABDOMEN:  No palpable organs or masses.   PLAN:  Continue regimen.  Obtain cardiology consult.     Angu   AGM/MEDQ  D:  07/22/2004  T:  07/22/2004  Job:  [id number]

## 2011-01-31 NOTE — Procedures (Signed)
   NAME:  [name] [name], [name] [name]                           ACCOUNT NO.:  [id number]   MEDICAL RECORD NO.:  [id number]                   PATIENT TYPE:  PREC   LOCATION:                                       FACILITY:   PHYSICIAN:  Vida Roller, M.D.                DATE OF BIRTH:  12-Dec-1940   DATE OF PROCEDURE:  12/26/2002  DATE OF DISCHARGE:                                    STRESS TEST   STRESS CARDIOLITE   INDICATIONS:  The patient is a 70 year old male with no known coronary  artery disease who presented with atypical chest discomfort.  His cardiac  risk factors include tobacco abuse, family history, hyperlipidemia, male  sex, and increased age.   BASELINE DATA:  EKG reveals sinus rhythm at 69 beats per minute with  nonspecific ST abnormalities.  Blood pressure is 150/88.   DESCRIPTION:  The patient exercised for a total of five minutes and two  seconds to Bruce protocol stage 2 to 7.0 METS.  The patient did complain of  mild shortness of breath at the end of exercise; however, no chest  discomfort.  Exercise was stopped secondary to fatigue.   STRESS DATA:  Maximum heart rate 153 beats per minute (96% of predicted  maximum); maximum blood pressure 190/88.  EKG:  No arrhythmias, minimal ST  depression in V5 and V6 (less than 1 mm).   Final images and results are pending M.D. review.     Amy Mercy Riding, P.A. [facility]                     Vida Roller, M.D.    AB/MEDQ  D:  12/26/2002  T:  12/26/2002  Job:  [id number]

## 2011-03-27 ENCOUNTER — Encounter: Payer: Self-pay | Admitting: Cardiology

## 2011-04-04 ENCOUNTER — Ambulatory Visit (INDEPENDENT_AMBULATORY_CARE_PROVIDER_SITE_OTHER): Payer: Medicare Other | Admitting: Cardiology

## 2011-04-04 ENCOUNTER — Encounter: Payer: Self-pay | Admitting: Cardiology

## 2011-04-04 VITALS — BP 123/71 | HR 75 | Ht 70.0 in | Wt 179.0 lb

## 2011-04-04 DIAGNOSIS — J449 Chronic obstructive pulmonary disease, unspecified: Secondary | ICD-10-CM

## 2011-04-04 DIAGNOSIS — I4891 Unspecified atrial fibrillation: Secondary | ICD-10-CM

## 2011-04-04 MED ORDER — DILTIAZEM HCL 120 MG PO TABS: 120.0000 mg | ORAL_TABLET | Freq: Two times a day (BID) | ORAL | Status: DC

## 2011-04-04 NOTE — Assessment & Plan Note (Signed)
Continue followup with Dr. Delford Field.

## 2011-04-04 NOTE — Patient Instructions (Signed)
Your physician recommends that you continue on your current medications as directed. Please refer to the Current Medication list given to you today.  Your physician recommends that you schedule a follow-up appointment in: 1 year  

## 2011-04-04 NOTE — Assessment & Plan Note (Signed)
Fairly quiescent. Remains in sinus rhythm by ECG. At this point plan to continue strategy of aspirin and Cardizem CD. Annual followup will be arranged, sooner if needed.

## 2011-04-04 NOTE — Progress Notes (Signed)
Clinical Summary Mr. Jou is a 70 y.o.male presenting for followup. He was seen back in January.  He reports no problems with significant palpitations, no chest pain. Continues to have flares of COPD occasionally, follows with Dr. Delford Field.  He reports compliance with medications, has only occasional bruising with aspirin. No reported trouble with his Cardizem.  Followup ECG is reviewed below.  No Known Allergies  Current outpatient prescriptions:albuterol (PROAIR HFA) 108 (90 BASE) MCG/ACT inhaler, Inhale 2 puffs into the lungs every 6 (six) hours as needed.  , Disp: , Rfl: ;  Ascorbic Acid (VITAMIN C) 500 MG tablet, Take 500 mg by mouth daily.  , Disp: , Rfl: ;  aspirin 81 MG tablet, Twice weekly, Disp: , Rfl: ;  diltiazem (CARDIZEM) 120 MG tablet, 120 mg. Take 2 tablets in the morning and 1 in the evening , Disp: , Rfl:  fish oil-omega-3 fatty acids 1000 MG capsule, Take 1 g by mouth daily.  , Disp: , Rfl: ;  HYDROcodone-acetaminophen (VICODIN) 5-500 MG per tablet, Take 1 tablet by mouth every 12 (twelve) hours. , Disp: , Rfl: ;  Multiple Vitamin (MULTIVITAMIN) capsule, Take 1 capsule by mouth daily.  , Disp: , Rfl: ;  pregabalin (LYRICA) 150 MG capsule, Take 150 mg by mouth 2 (two) times daily. , Disp: , Rfl:  tiotropium (SPIRIVA) 18 MCG inhalation capsule, Place 18 mcg into inhaler and inhale daily.  , Disp: , Rfl: ;  traMADol (ULTRAM) 50 MG tablet, Take 50 mg by mouth every 12 (twelve) hours. , Disp: , Rfl: ;  predniSONE (DELTASONE) 10 MG tablet, Take 4 for two days, three for two days, two for two days, then one for two days and stop , Disp: 20 tablet, Rfl: 0  Past Medical History  Diagnosis Date  . Prostate cancer     Status post radium seed implant  . COPD (chronic obstructive pulmonary disease)   . Mixed hyperlipidemia   . Essential hypertension, benign   . Atrial fibrillation   . Neuropathy   . Palpitations   . DJD (degenerative joint disease), lumbar     Social History Mr.  Schreier reports that he quit smoking about 12 years ago. His smoking use included Cigarettes. He has a 80 pack-year smoking history. He has never used smokeless tobacco. Mr. Dutton reports that he does not drink alcohol.  Review of Systems Some chest congestion, no progressive cough or wheezing, no fevers or chills. Otherwise negative.  Physical Examination Filed Vitals:   04/04/11 1425  BP: 123/71  Pulse: 75  Comfortable in no acute distress.  HEENT: Conjuctivae and lids normal, oropharynx clear with moist mucosa.  Neck: Supple, no elevated JVP, no loud carotid bruits, no thyromegaly or tenderness.  Lungs: Nonlabored breathing at rest. Diminished course breath sounds but no wheezes.  Cor: PMI nondisplaced. RRR. No pathologic systolic murmurs. No S3 or rub.  Abd: Soft, NTND. No HSM. No bruits. Normoactive bowel sounds.  Ext: No CCE. Distal pulses 1+.  MS: No kyphosis noted.  Neuropsych: A&Ox3, affect grossly normal.    ECG Normal sinus rhythm at 68 beats per minute, decreased R wave progression.    Problem List and Plan

## 2011-06-13 LAB — POCT I-STAT 4, (NA,K, GLUC, HGB,HCT)
HCT: 46
Hemoglobin: 15.6
Potassium: 4.1
Sodium: 139

## 2011-06-16 ENCOUNTER — Telehealth: Payer: Self-pay | Admitting: Cardiology

## 2011-06-16 ENCOUNTER — Other Ambulatory Visit: Payer: Self-pay

## 2011-06-16 MED ORDER — DILTIAZEM HCL 120 MG PO TABS: 120.0000 mg | ORAL_TABLET | Freq: Two times a day (BID) | ORAL | Status: DC

## 2011-06-16 NOTE — Telephone Encounter (Signed)
[facility] IS TELLING PATIENT THAT THEY NEVER GOT REQUEST FOR DILTIAZEM REFILLS AT LAST OFFICE VISIT  04/04/11? LISTED THAT IS OKAY FOR 90 PILLS AND 11 REFILLS/TMJ

## 2011-07-01 LAB — POCT I-STAT 4, (NA,K, GLUC, HGB,HCT)
Glucose, Bld: 90
Hemoglobin: 16
Potassium: 4

## 2011-07-16 ENCOUNTER — Ambulatory Visit (INDEPENDENT_AMBULATORY_CARE_PROVIDER_SITE_OTHER): Payer: Medicare Other | Admitting: Critical Care Medicine

## 2011-07-16 ENCOUNTER — Encounter: Payer: Self-pay | Admitting: Critical Care Medicine

## 2011-07-16 DIAGNOSIS — J449 Chronic obstructive pulmonary disease, unspecified: Secondary | ICD-10-CM

## 2011-07-16 NOTE — Assessment & Plan Note (Signed)
Stable chronic obstructive lung disease Plan Maintain inhaled medications as prescribed

## 2011-07-16 NOTE — Progress Notes (Signed)
Subjective:    Patient ID: [name] [name], male    DOB: 1940-11-01, 70 y.o.   MRN: [id number]  HPI  This is a  70 y.o.   WM with COPD Golds Stage I   10/05/2008:  DLCO %Predicted: 74  FEF 25/75 %Predicted: 18  FEV1 %Predicted: 70  FVC %Predicted: 99  Post Spirometry FEF 25/75 %Predicted: 15  Post Spirometry FEV1 %Predicted: 66  Post Spirometry FVC %Predicted: 99  RV %Predicted: 106  TLC %Predicted: 114     07/16/2011 Since last ov doing well.  No new hayfever issues.  On spiriva.  No real mucus. No chest pain. Pt denies any significant sore throat, nasal congestion or excess secretions, fever, chills, sweats, unintended weight loss, pleurtic or exertional chest pain, orthopnea PND, or leg swelling Pt denies any increase in rescue therapy over baseline, denies waking up needing it or having any early am or nocturnal exacerbations of coughing/wheezing/or dyspnea. Pt also denies any obvious fluctuation in symptoms with  weather or environmental change or other alleviating or aggravating factors    Past Medical History  Diagnosis Date  . Prostate cancer     Status post radium seed implant  . COPD (chronic obstructive pulmonary disease)   . Mixed hyperlipidemia   . Essential hypertension, benign   . Atrial fibrillation   . Neuropathy   . Palpitations   . DJD (degenerative joint disease), lumbar      Family History  Problem Relation Age of Onset  . COPD Mother   . Heart disease Brother      History   Social History  . Marital Status: Married    Spouse Name: N/A    Number of Children: N/A  . Years of Education: N/A   Occupational History  . Not on file.   Social History Main Topics  . Smoking status: Former Smoker -- 2.0 packs/day for 40 years    Types: Cigarettes    Quit date: 09/15/1998  . Smokeless tobacco: Never Used  . Alcohol Use: No  . Drug Use: No  . Sexually Active: Not on file   Other Topics Concern  . Not on file   Social History Narrative  .  No narrative on file     No Known Allergies   Outpatient Prescriptions Prior to Visit  Medication Sig Dispense Refill  . albuterol (PROAIR HFA) 108 (90 BASE) MCG/ACT inhaler Inhale 2 puffs into the lungs every 6 (six) hours as needed.        . Ascorbic Acid (VITAMIN C) 500 MG tablet Take 500 mg by mouth daily.        Marland Kitchen aspirin 81 MG tablet Twice weekly      . diltiazem (CARDIZEM) 120 MG tablet Take 1 tablet (120 mg total) by mouth 2 (two) times daily. Take 2 tablets in the morning and 1 in the evening  90 tablet  3  . fish oil-omega-3 fatty acids 1000 MG capsule Take 1 g by mouth daily.        Marland Kitchen HYDROcodone-acetaminophen (VICODIN) 5-500 MG per tablet Take 1 tablet by mouth every 12 (twelve) hours.       . Multiple Vitamin (MULTIVITAMIN) capsule Take 1 capsule by mouth daily.        . pregabalin (LYRICA) 150 MG capsule Take 150 mg by mouth 2 (two) times daily.       Marland Kitchen tiotropium (SPIRIVA) 18 MCG inhalation capsule Place 18 mcg into inhaler and inhale daily.        Marland Kitchen  traMADol (ULTRAM) 50 MG tablet Take 50 mg by mouth every 12 (twelve) hours.       . predniSONE (DELTASONE) 10 MG tablet Take 4 for two days, three for two days, two for two days, then one for two days and stop   20 tablet  0      Review of Systems  Constitutional:   No  weight loss, night sweats,  Fevers, chills, fatigue, lassitude. HEENT:   No headaches,  Difficulty swallowing,  Tooth/dental problems,  Sore throat,                No sneezing, itching, ear ache, nasal congestion, post nasal drip,   CV:  No chest pain,  Orthopnea, PND, swelling in lower extremities, anasarca, dizziness, palpitations  GI  No heartburn, indigestion, abdominal pain, nausea, vomiting, diarrhea, change in bowel habits, loss of appetite  Resp: Notes  shortness of breath with exertion not  at rest.  No excess mucus, no productive cough,  Notes  non-productive cough,  No coughing up of blood.  No change in color of mucus.  No wheezing.  No chest  wall deformity  Skin: no rash or lesions.  GU: no dysuria, change in color of urine, no urgency or frequency.  No flank pain.  MS:  No joint pain or swelling.  No decreased range of motion.  No back pain.  Psych:  No change in mood or affect. No depression or anxiety.  No memory loss.     Objective:   Physical Exam  Filed Vitals:   07/16/11 0940  BP: 140/76  Pulse: 82  Temp: 97.8 F (36.6 C)  TempSrc: Oral  Height: 5\' 10"  (1.778 m)  Weight: 180 lb 12.8 oz (82.01 kg)  SpO2: 97%    Gen: Pleasant, well-nourished, in no distress,  normal affect  ENT: No lesions,  mouth clear,  oropharynx clear, no postnasal drip  Neck: No JVD, no TMG, no carotid bruits  Lungs: No use of accessory muscles, no dullness to percussion,distant BS  Cardiovascular: RRR, heart sounds normal, no murmur or gallops, no peripheral edema  Abdomen: soft and NT, no HSM,  BS normal  Musculoskeletal: No deformities, no cyanosis or clubbing  Neuro: alert, non focal  Skin: Warm, no lesions or rashes        Assessment & Plan:   COPD Stable chronic obstructive lung disease Plan Maintain inhaled medications as prescribed     Updated Medication List Outpatient Encounter Prescriptions as of 07/16/2011  Medication Sig Dispense Refill  . albuterol (PROAIR HFA) 108 (90 BASE) MCG/ACT inhaler Inhale 2 puffs into the lungs every 6 (six) hours as needed.        . Ascorbic Acid (VITAMIN C) 500 MG tablet Take 500 mg by mouth daily.        Marland Kitchen aspirin 81 MG tablet Twice weekly      . diltiazem (CARDIZEM) 120 MG tablet Take 1 tablet (120 mg total) by mouth 2 (two) times daily. Take 2 tablets in the morning and 1 in the evening  90 tablet  3  . fish oil-omega-3 fatty acids 1000 MG capsule Take 1 g by mouth daily.        Marland Kitchen HYDROcodone-acetaminophen (VICODIN) 5-500 MG per tablet Take 1 tablet by mouth every 12 (twelve) hours.       . Multiple Vitamin (MULTIVITAMIN) capsule Take 1 capsule by mouth daily.          . pregabalin (LYRICA) 150 MG capsule Take  150 mg by mouth 2 (two) times daily.       Marland Kitchen tiotropium (SPIRIVA) 18 MCG inhalation capsule Place 18 mcg into inhaler and inhale daily.        . traMADol (ULTRAM) 50 MG tablet Take 50 mg by mouth every 12 (twelve) hours.       Marland Kitchen DISCONTD: diltiazem (CARDIZEM CD) 120 MG 24 hr capsule       . DISCONTD: predniSONE (DELTASONE) 10 MG tablet Take 4 for two days, three for two days, two for two days, then one for two days and stop   20 tablet  0

## 2011-07-16 NOTE — Patient Instructions (Signed)
No change in medications. Return in        6 months        

## 2011-08-28 ENCOUNTER — Other Ambulatory Visit: Payer: Self-pay | Admitting: Urology

## 2011-09-10 ENCOUNTER — Other Ambulatory Visit: Payer: Self-pay | Admitting: Critical Care Medicine

## 2011-09-10 ENCOUNTER — Encounter (HOSPITAL_BASED_OUTPATIENT_CLINIC_OR_DEPARTMENT_OTHER): Payer: Self-pay | Admitting: *Deleted

## 2011-09-10 NOTE — Progress Notes (Addendum)
To [facility] at 0915 .istat on arrival,ekg with chart.to use spiriva ,take his cardizem,tramadol w/sip water that am,also to bring his proair inhaler.npo after mn

## 2011-09-13 NOTE — H&P (Signed)
[name] [name] is having a repeat urethral dilation. 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 Again, the last time he had a procedure done was back in May of 2010, so it has been quite some time. Last visit he had reported a decrease in libido. His testosterone level was actually normal but really on the low side and we did offer him the possibility of going on some testosterone supplementation but he never started that. Otherwise, his situation is relatively unchanged and PSA last week was stable and close to undetectable at 0.05.       History of Present Illness           Past Gu Hx:     The patient's main issue is a history of adenocarcinoma of the prostate.  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  The patient initial underwent active surveilance, but his PSA increased and he elected definitive management.  The patient underwent seed implantation in March 2008.  D90 was around 101%.  At the time of his seed implantation the patient was noted to have a substantial bladder neck contracture.  He had previously had laser surgery of his prostate.  The patient ended up having a dilation, but then developed substantial voiding symptoms and required a more formal dilation and Holmium laser incision of his bladder neck.  The patient's stream has slowed down, but for the most part he has been satisfied with voiding with minimal nocturia, and he really is without substantial complaints.  The patient's PSA has shown a fair amount of lability.  The patient's PSA went up as high as 2.98 in November 2009.  His prior Nadir level of PSA was 0.9 in March 2009.  The PSA was subsequently rechecked in early February 2010 and was back down to 0.9 which was encouraging.            Things are fairly stable.  His AUA symptom score is 10 with a bothersome  score of 2 ( 08/2010). Stream has slowed down but has not worsened since last visit.  PSA in August in 2011 was down to 0.09, which is extremely encouraging.  Penile injections continue to work well for him. PSA in 08/2010 was up slightly to 0.23.   Past Medical History Problems  1. History of  Back Pain 2. History of  Prostate Cancer V10.46  Surgical History Problems  1. History of  Prostate Surgery 2. History of  Transurethral Incision Of Prostate 3. History of  Transurethral Resection Of Bladder Neck  Current Meds 1. Aspirin 81 MG Oral Tablet; 1 per day; Therapy: (Recorded:12Jun2012) to 2. Diltiazem HCl ER Coated Beads 120 MG Oral Capsule Extended Release 24 Hour; 120mg ; 2  capsules in the morning; 120mg  capsule in the evening; Therapy: 04Mar2010 to 3. Hydrocodone-Acetaminophen 5-500 MG Oral Tablet; 1 twice daily as needed for pain; Therapy:  (Recorded:12Jun2012) to 4. Lyrica 50 MG Oral Capsule; 1 capsule twice daily; Therapy: (Recorded:12Jun2012) to 5. Multi-Vitamin TABS; 1 per day; Therapy: (Recorded:12Jun2012) to 6. Spiriva HandiHaler 18 MCG Inhalation Capsule; 1 daily; Therapy: (Recorded:12Jun2012) to 7. TraMADol HCl 50 MG Oral Tablet; 1 twice daily; Therapy: 17Feb2011 to 8. Trimix (PGE 20, PAP 30, PHE 0.5); INJECT 0.2-0.3ML #5ML; Therapy: 25Jul2008 to (Last  Rx:12Jun2012)  Requested for: 12Jun2012  Allergies Medication  1. No Known Drug Allergies  Family History Problems  1.  Family history of  Family Health Status Number Of Children 3 sons3 daughters  Social History Problems  1. Caffeine Use 4 per day 2. Family history of  Death In The Family Father age 20-pneumonia 3. Family history of  Death In The Family Mother age 32 4. Marital History - Currently Married 5. History of  Tobacco Use V15.82 smoked 2 packs per day for 40 yearsquit for 8 years Denied  6. History of  Alcohol Use  Review of Systems Genitourinary, constitutional, skin, eye, otolaryngeal,  hematologic/lymphatic, cardiovascular, pulmonary, endocrine, musculoskeletal, gastrointestinal, neurological and psychiatric system(s) were reviewed and pertinent findings if present are noted.  Genitourinary: weak urinary stream, urinary stream starts and stops, incomplete emptying of bladder and erectile dysfunction, but no urinary frequency, no feelings of urinary urgency, no dysuria, no nocturia, no difficulty starting the urinary stream and no hematuria.  Gastrointestinal: no nausea, no vomiting, no flank pain, no abdominal pain and no constipation.  Constitutional: no fever and not feeling tired (fatigue).  Hematologic/Lymphatic: a tendency to easily bruise.  Respiratory: shortness of breath, but no cough.  Musculoskeletal: back pain and limb pain, but no bone pain and no joint pain.    Vitals Vital Signs [Data Includes: Last 1 Day]  13Dec2012 08:42AM  Blood Pressure: 145 / 86 Temperature: 97.1 F Heart Rate: 84  Physical Exam Constitutional: Well nourished Amended By: Barron Alvine; 09/13/2011 5:18 PMEST  and well developed Amended By: Barron Alvine; 09/13/2011 5:18 PMEST . No acute distress. Amended By: Barron Alvine; 09/13/2011 5:18 PMEST.  ENT:. The ears and nose are normal in appearance. Amended By: Barron Alvine; 09/13/2011 5:18 PMEST.  Pulmonary: No respiratory distress Amended By: Barron Alvine; 09/13/2011 5:18 PMEST  and normal respiratory rhythm and effort Amended By: Barron Alvine; 09/13/2011 5:18 PMEST.  Cardiovascular: Heart rate and rhythm are normal Amended By: Barron Alvine; 09/13/2011 5:18 PMEST . No peripheral edema. Amended By: Barron Alvine; 09/13/2011 5:18 PMEST.  Abdomen: The abdomen is soft and nontender Amended By: Barron Alvine; 09/13/2011 5:18 PMEST No masses are palpated Amended By: Barron Alvine; 09/13/2011 5:18 PMEST No CVA tenderness Amended By: Barron Alvine; 09/13/2011 5:18 PMEST. No hernias are palpable Amended By: Barron Alvine; 09/13/2011 5:18 PMEST No  hepatosplenomegaly noted Amended By: Barron Alvine; 09/13/2011 5:18 PMEST  Genitourinary: Examination of the penis demonstrates no discharge Amended By: Barron Alvine; 09/13/2011 5:18 PMEST , no masses Amended By: Barron Alvine; 09/13/2011 5:18 PMEST , no lesions Amended By: Barron Alvine; 09/13/2011 5:18 PMEST  and a normal meatus Amended By: Barron Alvine; 09/13/2011 5:18 PMEST. The scrotum is without lesions Amended By: Barron Alvine; 09/13/2011 5:18 PMEST. The right epididymis is palpably normal Amended By: Barron Alvine; 09/13/2011 5:18 PMEST  and non-tender Amended By: Barron Alvine; 09/13/2011 5:18 PMEST. The left epididymis is palpably normal Amended By: Barron Alvine; 09/13/2011 5:18 PMEST  and non-tender Amended By: Barron Alvine; 09/13/2011 5:18 PMEST. The right testis is non-tender Amended By: Barron Alvine; 09/13/2011 5:18 PMEST  and without masses Amended By: Barron Alvine; 09/13/2011 5:18 PMEST. The left testis is non-tender Amended By: Barron Alvine; 09/13/2011 5:18 PMEST  and without masses Amended By: Barron Alvine; 09/13/2011 5:18 PMEST.    Results/Data Urine [Data Includes: Last 1 Day]   13Dec2012  COLOR YELLOW   APPEARANCE CLEAR   SPECIFIC GRAVITY 1.010   pH 6.0   GLUCOSE NEG mg/dL  BILIRUBIN NEG   KETONE NEG mg/dL  BLOOD NEG   PROTEIN NEG mg/dL  UROBILINOGEN 0.2 mg/dL  NITRITE NEG  LEUKOCYTE ESTERASE NEG    Assessment Assessed  1. 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 Prostate Cancer 185  Plan Health Maintenance (V70.0)  1. UA With REFLEX  Done: 13Dec2012 08:34AM Prostate Cancer (185)  2. PSA  Requested for: 13Dec2012 3. Follow-up Month x 6 Office  Follow-up  Requested for: 13Dec2012 4. Follow-up Schedule Surgery Office  Follow-up  Requested for: 13Dec2012  Discussion/Summary   From a cancer standpoint things continue to look extremely encouraging for [facility]. He clearly has had some progression in his voiding symptoms and I suspect his urethral stricture has  narrowed substantially. We both feel like it is time to do something again and we will plan on outpatient cystoscopy with balloon dilation of the urethral stricture. We will plan on probably leaving the Foley catheter in for a few days status post that procedure and hopefully he will have a substantial improvement. I do suspect he will need to continue to have this done periodically. We will set that up for sometime in our next available opening along with evaluation of his schedule.

## 2011-09-15 ENCOUNTER — Ambulatory Visit (HOSPITAL_COMMUNITY): Payer: Medicare Other

## 2011-09-15 ENCOUNTER — Encounter (HOSPITAL_BASED_OUTPATIENT_CLINIC_OR_DEPARTMENT_OTHER): Payer: Self-pay | Admitting: Anesthesiology

## 2011-09-15 ENCOUNTER — Ambulatory Visit (HOSPITAL_BASED_OUTPATIENT_CLINIC_OR_DEPARTMENT_OTHER)
Admission: RE | Admit: 2011-09-15 | Discharge: 2011-09-15 | Disposition: A | Discharge status: Home / Self Care | Payer: Medicare Other | Source: Ambulatory Visit | Attending: Urology | Admitting: Urology

## 2011-09-15 ENCOUNTER — Encounter (HOSPITAL_BASED_OUTPATIENT_CLINIC_OR_DEPARTMENT_OTHER): Payer: Self-pay | Admitting: *Deleted

## 2011-09-15 ENCOUNTER — Ambulatory Visit (HOSPITAL_BASED_OUTPATIENT_CLINIC_OR_DEPARTMENT_OTHER): Payer: Medicare Other | Admitting: Anesthesiology

## 2011-09-15 ENCOUNTER — Encounter (HOSPITAL_BASED_OUTPATIENT_CLINIC_OR_DEPARTMENT_OTHER)
Admission: RE | Disposition: A | Discharge status: Home / Self Care | Payer: Self-pay | Source: Ambulatory Visit | Attending: Urology

## 2011-09-15 DIAGNOSIS — N320 Bladder-neck obstruction: Secondary | ICD-10-CM | POA: Diagnosis present

## 2011-09-15 DIAGNOSIS — C61 Malignant neoplasm of prostate: Secondary | ICD-10-CM | POA: Insufficient documentation

## 2011-09-15 DIAGNOSIS — Z8546 Personal history of malignant neoplasm of prostate: Secondary | ICD-10-CM | POA: Insufficient documentation

## 2011-09-15 DIAGNOSIS — Z7982 Long term (current) use of aspirin: Secondary | ICD-10-CM | POA: Insufficient documentation

## 2011-09-15 DIAGNOSIS — Z79899 Other long term (current) drug therapy: Secondary | ICD-10-CM | POA: Insufficient documentation

## 2011-09-15 HISTORY — PX: CYSTOSCOPY WITH URETHRAL DILATATION: SHX5125

## 2011-09-15 LAB — POCT I-STAT 4, (NA,K, GLUC, HGB,HCT)
Hemoglobin: 15.3 g/dL (ref 13.0–17.0)
Potassium: 4.1 mEq/L (ref 3.5–5.1)

## 2011-09-15 SURGERY — CYSTOSCOPY, WITH URETHRAL DILATION
Anesthesia: General | Site: Urethra | Wound class: Clean Contaminated

## 2011-09-15 MED ORDER — IOHEXOL 350 MG/ML SOLN
INTRAVENOUS | Status: DC | PRN
  Administered 2011-09-15: 50 mL

## 2011-09-15 MED ORDER — DEXAMETHASONE SODIUM PHOSPHATE 4 MG/ML IJ SOLN
INTRAMUSCULAR | Status: DC | PRN
  Administered 2011-09-15: 10 mg via INTRAVENOUS

## 2011-09-15 MED ORDER — PROMETHAZINE HCL 25 MG/ML IJ SOLN: 6.2500 mg | INTRAMUSCULAR | Status: DC | PRN

## 2011-09-15 MED ORDER — ONDANSETRON HCL 4 MG/2ML IJ SOLN
INTRAMUSCULAR | Status: DC | PRN
  Administered 2011-09-15: 4 mg via INTRAVENOUS

## 2011-09-15 MED ORDER — LACTATED RINGERS IV SOLN
INTRAVENOUS | Status: DC
  Administered 2011-09-15: 10:00:00 via INTRAVENOUS

## 2011-09-15 MED ORDER — STERILE WATER FOR IRRIGATION IR SOLN
Status: DC | PRN
  Administered 2011-09-15: 3000 mL

## 2011-09-15 MED ORDER — EPHEDRINE SULFATE 50 MG/ML IJ SOLN
INTRAMUSCULAR | Status: DC | PRN
  Administered 2011-09-15 (×2): 5 mg via INTRAVENOUS
  Administered 2011-09-15: 10 mg via INTRAVENOUS

## 2011-09-15 MED ORDER — LIDOCAINE HCL (CARDIAC) 20 MG/ML IV SOLN
INTRAVENOUS | Status: DC | PRN
  Administered 2011-09-15: 100 mg via INTRAVENOUS

## 2011-09-15 MED ORDER — FENTANYL CITRATE 0.05 MG/ML IJ SOLN: 25.0000 ug | INTRAMUSCULAR | Status: DC | PRN

## 2011-09-15 MED ORDER — HYDROCODONE-ACETAMINOPHEN 5-325 MG PO TABS: 1.0000 | ORAL_TABLET | Freq: Four times a day (QID) | ORAL | Status: AC | PRN

## 2011-09-15 MED ORDER — FENTANYL CITRATE 0.05 MG/ML IJ SOLN
INTRAMUSCULAR | Status: DC | PRN
  Administered 2011-09-15: 50 ug via INTRAVENOUS
  Administered 2011-09-15 (×2): 25 ug via INTRAVENOUS

## 2011-09-15 MED ORDER — CIPROFLOXACIN IN D5W 400 MG/200ML IV SOLN
400.0000 mg | INTRAVENOUS | Status: AC
  Administered 2011-09-15: 400 mg via INTRAVENOUS

## 2011-09-15 MED ORDER — PROPOFOL 10 MG/ML IV EMUL
INTRAVENOUS | Status: DC | PRN
  Administered 2011-09-15: 200 mg via INTRAVENOUS

## 2011-09-15 MED ORDER — LIDOCAINE HCL 2 % EX GEL
CUTANEOUS | Status: DC | PRN
  Administered 2011-09-15: 1

## 2011-09-15 MED ORDER — HYDROCODONE-ACETAMINOPHEN 5-325 MG PO TABS
1.0000 | ORAL_TABLET | Freq: Four times a day (QID) | ORAL | Status: DC | PRN
  Administered 2011-09-15: 1 via ORAL

## 2011-09-15 SURGICAL SUPPLY — 28 items
BAG DRAIN URO-CYSTO SKYTR STRL (DRAIN) ×2 IMPLANT
BAG DRN ANRFLXCHMBR STRAP LEK (BAG) ×1
BAG DRN UROCATH (DRAIN) ×1
BAG URINE LEG 19OZ MD ST LTX (BAG) ×1 IMPLANT
BALLN NEPHROSTOMY (BALLOONS) ×2
BALLOON NEPHROSTOMY (BALLOONS) ×1 IMPLANT
CANISTER SUCT LVC 12 LTR MEDI- (MISCELLANEOUS) ×1 IMPLANT
CATH FOLEY 2W COUNCIL 20FR 5CC (CATHETERS) ×1 IMPLANT
CATH FOLEY 2WAY SLVR  5CC 16FR (CATHETERS)
CATH FOLEY 2WAY SLVR 5CC 16FR (CATHETERS) IMPLANT
CLOTH BEACON ORANGE TIMEOUT ST (SAFETY) ×2 IMPLANT
DRAPE CAMERA CLOSED 9X96 (DRAPES) ×2 IMPLANT
ELECT REM PT RETURN 9FT ADLT (ELECTROSURGICAL)
ELECTRODE REM PT RTRN 9FT ADLT (ELECTROSURGICAL) ×1 IMPLANT
GLOVE BIO SURGEON STRL SZ7.5 (GLOVE) ×2 IMPLANT
GLOVE INDICATOR 6.5 STRL GRN (GLOVE) ×1 IMPLANT
GOWN STRL NON-REIN LRG LVL3 (GOWN DISPOSABLE) ×1 IMPLANT
GOWN STRL REIN XL XLG (GOWN DISPOSABLE) ×2 IMPLANT
GOWN XL W/COTTON TOWEL STD (GOWNS) ×1 IMPLANT
GUIDEWIRE STR DUAL SENSOR (WIRE) ×1 IMPLANT
NDL SAFETY ECLIPSE 18X1.5 (NEEDLE) IMPLANT
NEEDLE HYPO 18GX1.5 SHARP (NEEDLE)
NEEDLE HYPO 22GX1.5 SAFETY (NEEDLE) IMPLANT
NS IRRIG 500ML POUR BTL (IV SOLUTION) ×1 IMPLANT
PACK CYSTOSCOPY (CUSTOM PROCEDURE TRAY) ×2 IMPLANT
SYR 20CC LL (SYRINGE) IMPLANT
SYR 30ML LL (SYRINGE) IMPLANT
WATER STERILE IRR 3000ML UROMA (IV SOLUTION) ×2 IMPLANT

## 2011-09-15 NOTE — Transfer of Care (Signed)
Immediate Anesthesia Transfer of Care Note  Patient: [name] [name]  Procedure(s) Performed:  CYSTOSCOPY WITH URETHRAL DILATATION - CYSTOSCOPY, BALLOON DILATION Rad tech ok by ann at main  Patient Location: PACU  Anesthesia Type: General  Level of Consciousness: awake, alert  and oriented  Airway & Oxygen Therapy: Patient Spontanous Breathing and Patient connected to face mask oxygen  Post-op Assessment: Report given to PACU RN and Post -op Vital signs reviewed and stable  Post vital signs: Reviewed and stable  Complications: No apparent anesthesia complications

## 2011-09-15 NOTE — Anesthesia Postprocedure Evaluation (Signed)
  Anesthesia Post-op Note  Patient: [name] [name]  Procedure(s) Performed:  CYSTOSCOPY WITH URETHRAL DILATATION - CYSTOSCOPY, BALLOON DILATION Rad tech ok by ann at main  Patient Location: PACU  Anesthesia Type: General  Level of Consciousness: awake and alert   Airway and Oxygen Therapy: Patient Spontanous Breathing  Post-op Pain: mild  Post-op Assessment: Post-op Vital signs reviewed, Patient's Cardiovascular Status Stable, Respiratory Function Stable, Patent Airway and No signs of Nausea or vomiting  Post-op Vital Signs: stable  Complications: No apparent anesthesia complications

## 2011-09-15 NOTE — Anesthesia Procedure Notes (Signed)
Procedure Name: LMA Insertion Date/Time: 09/15/2011 10:26 AM Performed by: Huel Coventry Pre-anesthesia Checklist: Patient identified, Emergency Drugs available, Suction available and Patient being monitored Patient Re-evaluated:Patient Re-evaluated prior to inductionOxygen Delivery Method: Circle System Utilized Preoxygenation: Pre-oxygenation with 100% oxygen Intubation Type: IV induction Ventilation: Mask ventilation without difficulty LMA: LMA inserted LMA Size: 4.0 Number of attempts: 1 Airway Equipment and Method: bite block Placement Confirmation: positive ETCO2 Tube secured with: Tape Dental Injury: Teeth and Oropharynx as per pre-operative assessment

## 2011-09-15 NOTE — Interval H&P Note (Signed)
History and Physical Interval Note:  09/15/2011 10:12 AM  [name] [name]  has presented today for surgery, with the diagnosis of URETHRAL STRICTURE  The various methods of treatment have been discussed with the patient and family. After consideration of risks, benefits and other options for treatment, the patient has consented to  Procedure(s): CYSTOSCOPY WITH URETHRAL DILATATION as a surgical intervention .  The patients' history has been reviewed, patient examined, no change in status, stable for surgery.  I have reviewed the patients' chart and labs.  Questions were answered to the patient's satisfaction.     Jaryah Aracena S

## 2011-09-15 NOTE — Progress Notes (Signed)
Dentures returned to pt 

## 2011-09-15 NOTE — Anesthesia Preprocedure Evaluation (Addendum)
Anesthesia Evaluation  Patient identified by MRN, date of birth, ID band Patient awake    Reviewed: Allergy & Precautions, H&P , NPO status , Patient's Chart, lab work & pertinent test results  Airway Mallampati: II TM Distance: >3 FB Neck ROM: Full    Dental No notable dental hx. (+) Upper Dentures and Lower Dentures   Pulmonary neg pulmonary ROS, COPD COPD inhaler, former smoker Denies symptoms clear to auscultation Crackles heard. Pulmonary exam normal   + rales    Cardiovascular hypertension, Pt. on medications neg cardio ROS + dysrhythmias Atrial Fibrillation + Valvular Problems/Murmurs Regular Normal H/o murmur, but no murmur heard   Neuro/Psych neuropathy  Neuromuscular disease Negative Neurological ROS  Negative Psych ROS   GI/Hepatic negative GI ROS, Neg liver ROS,   Endo/Other  Negative Endocrine ROS  Renal/GU negative Renal ROS  Genitourinary negative   Musculoskeletal negative musculoskeletal ROS (+)   Abdominal   Peds negative pediatric ROS (+)  Hematology negative hematology ROS (+)   Anesthesia Other Findings   Reproductive/Obstetrics negative OB ROS                         Anesthesia Physical Anesthesia Plan  ASA: III  Anesthesia Plan: General   Post-op Pain Management:    Induction: Intravenous  Airway Management Planned: LMA  Additional Equipment:   Intra-op Plan:   Post-operative Plan: Extubation in OR  Informed Consent: I have reviewed the patients History and Physical, chart, labs and discussed the procedure including the risks, benefits and alternatives for the proposed anesthesia with the patient or authorized representative who has indicated his/her understanding and acceptance.   Dental advisory given  Plan Discussed with: CRNA  Anesthesia Plan Comments:         Anesthesia Quick Evaluation

## 2011-09-15 NOTE — Op Note (Signed)
Preoperative diagnosis: Bladder neck contracture with a history of laser TURP and prostate seed implantation for prostate cancer. Postoperative diagnosis: Same  Procedure: Cystoscopy with balloon dilation of bladder neck contracture   Surgeon: Valetta Fuller M.D.  Anesthesia: Gen.  Indications: Patient that has had problems with recurrent bladder neck contracture. The patient had a previous laser prostatectomy done elsewhere. The patient had a seed implantation for prostate cancer 3-4 years ago. He has had several procedures to treat bladder neck contracture with the last occurring in May of 2010. He is noticed a slowing of his urinary stream and requested a repeat dilation.     Technique and findings: Patient was brought the operating room where he had successful induction of general anesthesia. He was placed in lithotomy position and prepped and draped in usual manner. He received perioperative ciprofloxacin. Appropriate surgical timeout was performed. Cystoscopy again revealed a bladder neck contracture. A guidewire was placed into the bladder utilizing fluoroscopic imaging. A 24 French fascial dilating balloon was then utilized to dilate the contracture. This was done to approximately 14 atmospheres of pressure for 5 minutes. A 20 [place] council tip Foley catheter was then placed over the wire and urine drained the patient was brought to recovery room in stable condition having had no obvious complications or problems.

## 2011-09-17 ENCOUNTER — Encounter (HOSPITAL_BASED_OUTPATIENT_CLINIC_OR_DEPARTMENT_OTHER): Payer: Self-pay | Admitting: Urology

## 2011-11-06 ENCOUNTER — Telehealth: Payer: Self-pay | Admitting: Cardiology

## 2011-11-06 MED ORDER — DILTIAZEM HCL 120 MG PO TABS: 120.0000 mg | ORAL_TABLET | Freq: Two times a day (BID) | ORAL | Status: DC

## 2011-11-06 NOTE — Telephone Encounter (Signed)
Needs refill diltiazem called to [facility].Marland Kitchen  / tg

## 2012-02-06 ENCOUNTER — Other Ambulatory Visit: Payer: Self-pay | Admitting: Critical Care Medicine

## 2012-02-10 ENCOUNTER — Encounter: Payer: Self-pay | Admitting: Critical Care Medicine

## 2012-02-10 ENCOUNTER — Ambulatory Visit (INDEPENDENT_AMBULATORY_CARE_PROVIDER_SITE_OTHER): Payer: Medicare Other | Admitting: Critical Care Medicine

## 2012-02-10 VITALS — BP 138/82 | HR 77 | Temp 98.1°F | Ht 70.0 in | Wt 179.4 lb

## 2012-02-10 DIAGNOSIS — J449 Chronic obstructive pulmonary disease, unspecified: Secondary | ICD-10-CM

## 2012-02-10 MED ORDER — PREDNISONE 10 MG PO TABS: ORAL_TABLET | ORAL | Status: DC

## 2012-02-10 MED ORDER — BUDESONIDE-FORMOTEROL FUMARATE 160-4.5 MCG/ACT IN AERO: 2.0000 | INHALATION_SPRAY | Freq: Two times a day (BID) | RESPIRATORY_TRACT | Status: DC

## 2012-02-10 MED ORDER — AZITHROMYCIN 250 MG PO TABS: 250.0000 mg | ORAL_TABLET | Freq: Every day | ORAL | Status: AC

## 2012-02-10 NOTE — Patient Instructions (Signed)
Start Symbicort two puff twice daily Stay on spiriva daily Prednisone 10mg  Take 4 for three days 3 for three days 2 for three days 1 for three days and stop Take azithromycin 250mg  Take two once then one daily until gone  For 5 days total Return 2 months for recheck

## 2012-02-10 NOTE — Progress Notes (Signed)
Subjective:    Patient ID: [name] [name], male    DOB: 31-Jul-1941, 71 y.o.   MRN: [id number]  HPI  This is a  71 y.o.   WM with COPD Golds Stage I   10/05/2008:  DLCO %Predicted: 74  FEF 25/75 %Predicted: 18  FEV1 %Predicted: 70  FVC %Predicted: 99  Post Spirometry FEF 25/75 %Predicted: 15  Post Spirometry FEV1 %Predicted: 66  Post Spirometry FVC %Predicted: 99  RV %Predicted: 106  TLC %Predicted: 114      02/10/2012 Since last OV, more dyspneic with activity esp with pollen.  Notes chest congestion.  Seldom coughs, when does mucus comes up, mucus is pale lemon color.  No real chest pain.  Occ wheeze.    Past Medical History  Diagnosis Date  . Prostate cancer     Status post radium seed implant  . COPD (chronic obstructive pulmonary disease)   . Mixed hyperlipidemia   . Atrial fibrillation   . Palpitations   . DJD (degenerative joint disease), lumbar   . Neuropathy     feet and legs     Family History  Problem Relation Age of Onset  . COPD Mother   . Heart disease Brother      History   Social History  . Marital Status: Married    Spouse Name: N/A    Number of Children: N/A  . Years of Education: N/A   Occupational History  . Not on file.   Social History Main Topics  . Smoking status: Former Smoker -- 2.0 packs/day for 40 years    Types: Cigarettes    Quit date: 09/15/1998  . Smokeless tobacco: Never Used  . Alcohol Use: No  . Drug Use: No  . Sexually Active: Not on file   Other Topics Concern  . Not on file   Social History Narrative  . No narrative on file     No Known Allergies   Outpatient Prescriptions Prior to Visit  Medication Sig Dispense Refill  . albuterol (PROAIR HFA) 108 (90 BASE) MCG/ACT inhaler Inhale 2 puffs into the lungs every 6 (six) hours as needed.        . Ascorbic Acid (VITAMIN C) 500 MG tablet Take 500 mg by mouth daily.        Marland Kitchen aspirin 81 MG tablet Twice weekly      . fish oil-omega-3 fatty acids 1000 MG capsule  Take 1 g by mouth daily.        Marland Kitchen HYDROcodone-acetaminophen (VICODIN) 5-500 MG per tablet Take 1 tablet by mouth every 12 (twelve) hours.       . Multiple Vitamin (MULTIVITAMIN) capsule Take 1 capsule by mouth daily.        . pregabalin (LYRICA) 150 MG capsule Take 150 mg by mouth 3 (three) times daily.       Marland Kitchen SPIRIVA HANDIHALER 18 MCG inhalation capsule inhale contents of 1 capsule by mouth once daily  30 capsule  3  . traMADol (ULTRAM) 50 MG tablet Take 50 mg by mouth every 12 (twelve) hours.       Marland Kitchen diltiazem (CARDIZEM) 120 MG tablet Take 1 tablet (120 mg total) by mouth 2 (two) times daily. Take 2 tablets in the morning and 1 in the evening  180 tablet  1      Review of Systems  Constitutional:   No  weight loss, night sweats,  Fevers, chills, fatigue, lassitude. HEENT:   No headaches,  Difficulty swallowing,  Tooth/dental  problems,  Sore throat,                No sneezing, itching, ear ache, nasal congestion, post nasal drip,   CV:  No chest pain,  Orthopnea, PND, swelling in lower extremities, anasarca, dizziness, palpitations  GI  No heartburn, indigestion, abdominal pain, nausea, vomiting, diarrhea, change in bowel habits, loss of appetite  Resp: Notes  shortness of breath with exertion not  at rest.  No excess mucus, no productive cough,  Notes  non-productive cough,  No coughing up of blood.  No change in color of mucus.  No wheezing.  No chest wall deformity  Skin: no rash or lesions.  GU: no dysuria, change in color of urine, no urgency or frequency.  No flank pain.  MS:  No joint pain or swelling.  No decreased range of motion.  No back pain.  Psych:  No change in mood or affect. No depression or anxiety.  No memory loss.     Objective:   Physical Exam  Filed Vitals:   02/10/12 0956  BP: 138/82  Pulse: 77  Temp: 98.1 F (36.7 C)  TempSrc: Oral  Height: 5\' 10"  (1.778 m)  Weight: 179 lb 6.4 oz (81.375 kg)  SpO2: 95%    Gen: Pleasant, well-nourished, in no  distress,  normal affect  ENT: No lesions,  mouth clear,  oropharynx clear, no postnasal drip  Neck: No JVD, no TMG, no carotid bruits  Lungs: No use of accessory muscles, no dullness to percussion,distant BS  Cardiovascular: RRR, heart sounds normal, no murmur or gallops, no peripheral edema  Abdomen: soft and NT, no HSM,  BS normal  Musculoskeletal: No deformities, no cyanosis or clubbing  Neuro: alert, non focal  Skin: Warm, no lesions or rashes      Cleda Daub 02/10/2012   FeV1 39% FeV1/FVC 67%  Fef 25 75 15%.     Assessment & Plan:   COPD Gold stage C. COPD with progression in FEV1 from 70% predicted 2010 to 39% predicted 2013 Plan Prednisone pulse Azithromycin for 5 days And Symbicort 2 puffs twice daily Continue Spiriva Return 2 months     Updated Medication List Outpatient Encounter Prescriptions as of 02/10/2012  Medication Sig Dispense Refill  . albuterol (PROAIR HFA) 108 (90 BASE) MCG/ACT inhaler Inhale 2 puffs into the lungs every 6 (six) hours as needed.        . Ascorbic Acid (VITAMIN C) 500 MG tablet Take 500 mg by mouth daily.        Marland Kitchen aspirin 81 MG tablet Twice weekly      . diltiazem (CARDIZEM) 120 MG tablet Take by mouth 2 (two) times daily. Take 2 tablets in the morning and 1 in the evening      . fish oil-omega-3 fatty acids 1000 MG capsule Take 1 g by mouth daily.        Marland Kitchen HYDROcodone-acetaminophen (VICODIN) 5-500 MG per tablet Take 1 tablet by mouth every 12 (twelve) hours.       . Multiple Vitamin (MULTIVITAMIN) capsule Take 1 capsule by mouth daily.        . pregabalin (LYRICA) 150 MG capsule Take 150 mg by mouth 3 (three) times daily.       Marland Kitchen SPIRIVA HANDIHALER 18 MCG inhalation capsule inhale contents of 1 capsule by mouth once daily  30 capsule  3  . traMADol (ULTRAM) 50 MG tablet Take 50 mg by mouth every 12 (twelve) hours.       Marland Kitchen  DISCONTD: diltiazem (CARDIZEM) 120 MG tablet Take 1 tablet (120 mg total) by mouth 2 (two) times daily. Take 2  tablets in the morning and 1 in the evening  180 tablet  1  . azithromycin (ZITHROMAX) 250 MG tablet Take 1 tablet (250 mg total) by mouth daily. Take two once then one daily until gone  6 each  0  . budesonide-formoterol (SYMBICORT) 160-4.5 MCG/ACT inhaler Inhale 2 puffs into the lungs 2 (two) times daily.  1 Inhaler  12  . predniSONE (DELTASONE) 10 MG tablet Take 4 for three days 3 for three days 2 for three days 1 for three days and stop  30 tablet  0

## 2012-02-10 NOTE — Assessment & Plan Note (Signed)
Gold stage C. COPD with progression in FEV1 from 70% predicted 2010 to 39% predicted 2013 Plan Prednisone pulse Azithromycin for 5 days And Symbicort 2 puffs twice daily Continue Spiriva Return 2 months

## 2012-04-12 ENCOUNTER — Encounter: Payer: Self-pay | Admitting: Critical Care Medicine

## 2012-04-12 ENCOUNTER — Ambulatory Visit (INDEPENDENT_AMBULATORY_CARE_PROVIDER_SITE_OTHER): Payer: Medicare Other | Admitting: Critical Care Medicine

## 2012-04-12 VITALS — BP 128/72 | HR 76 | Temp 97.8°F | Ht 70.0 in | Wt 177.8 lb

## 2012-04-12 DIAGNOSIS — J449 Chronic obstructive pulmonary disease, unspecified: Secondary | ICD-10-CM

## 2012-04-12 NOTE — Progress Notes (Signed)
Subjective:    Patient ID: [name] [name], male    DOB: Jul 02, 1941, 71 y.o.   MRN: [id number]  HPI  This is a  71 y.o.   WM with COPD Golds Stage C    04/12/2012 Pt not tolerating symbicort two puff twice daily, now on one puff twice daily.   Pt has a dry cough and feels tight.  Pt is still dyspneic with exertion, but ok on ADLs. No chest pain.  Notes edema in feet   Past Medical History  Diagnosis Date  . Prostate cancer     Status post radium seed implant  . COPD (chronic obstructive pulmonary disease)   . Mixed hyperlipidemia   . Atrial fibrillation   . Palpitations   . DJD (degenerative joint disease), lumbar   . Neuropathy     feet and legs     Family History  Problem Relation Age of Onset  . COPD Mother   . Heart disease Brother      History   Social History  . Marital Status: Married    Spouse Name: N/A    Number of Children: N/A  . Years of Education: N/A   Occupational History  . Not on file.   Social History Main Topics  . Smoking status: Former Smoker -- 2.0 packs/day for 40 years    Types: Cigarettes    Quit date: 09/15/1998  . Smokeless tobacco: Never Used  . Alcohol Use: No  . Drug Use: No  . Sexually Active: Not on file   Other Topics Concern  . Not on file   Social History Narrative  . No narrative on file     No Known Allergies   Outpatient Prescriptions Prior to Visit  Medication Sig Dispense Refill  . albuterol (PROAIR HFA) 108 (90 BASE) MCG/ACT inhaler Inhale 2 puffs into the lungs every 6 (six) hours as needed.        . Ascorbic Acid (VITAMIN C) 500 MG tablet Take 500 mg by mouth daily.        Marland Kitchen aspirin 81 MG tablet Twice weekly      . diltiazem (CARDIZEM) 120 MG tablet Take by mouth 2 (two) times daily. Take 2 tablets in the morning and 1 in the evening      . fish oil-omega-3 fatty acids 1000 MG capsule Take 1 g by mouth daily.        Marland Kitchen HYDROcodone-acetaminophen (VICODIN) 5-500 MG per tablet Take 1 tablet by mouth every 12  (twelve) hours.       . Multiple Vitamin (MULTIVITAMIN) capsule Take 1 capsule by mouth daily.        . pregabalin (LYRICA) 150 MG capsule Take 150 mg by mouth 3 (three) times daily.       Marland Kitchen SPIRIVA HANDIHALER 18 MCG inhalation capsule inhale contents of 1 capsule by mouth once daily  30 capsule  3  . traMADol (ULTRAM) 50 MG tablet Take 50 mg by mouth every 12 (twelve) hours.       . budesonide-formoterol (SYMBICORT) 160-4.5 MCG/ACT inhaler Inhale 2 puffs into the lungs 2 (two) times daily.  1 Inhaler  12  . predniSONE (DELTASONE) 10 MG tablet Take 4 for three days 3 for three days 2 for three days 1 for three days and stop  30 tablet  0      Review of Systems  Constitutional:   No  weight loss, night sweats,  Fevers, chills, fatigue, lassitude. HEENT:   No headaches,  Difficulty swallowing,  Tooth/dental problems,  Sore throat,                No sneezing, itching, ear ache, nasal congestion, post nasal drip,   CV:  No chest pain,  Orthopnea, PND, swelling in lower extremities, anasarca, dizziness, palpitations  GI  No heartburn, indigestion, abdominal pain, nausea, vomiting, diarrhea, change in bowel habits, loss of appetite  Resp: Notes  shortness of breath with exertion not  at rest.  No excess mucus, no productive cough,  Notes  non-productive cough,  No coughing up of blood.  No change in color of mucus.  No wheezing.  No chest wall deformity  Skin: no rash or lesions.  GU: no dysuria, change in color of urine, no urgency or frequency.  No flank pain.  MS:  No joint pain or swelling.  No decreased range of motion.  No back pain.  Psych:  No change in mood or affect. No depression or anxiety.  No memory loss.     Objective:   Physical Exam  Filed Vitals:   04/12/12 1347  BP: 128/72  Pulse: 76  Temp: 97.8 F (36.6 C)  TempSrc: Oral  Height: 5\' 10"  (1.778 m)  Weight: 177 lb 12.8 oz (80.65 kg)  SpO2: 96%    Gen: Pleasant, well-nourished, in no distress,  normal  affect  ENT: No lesions,  mouth clear,  oropharynx clear, no postnasal drip  Neck: No JVD, no TMG, no carotid bruits  Lungs: No use of accessory muscles, no dullness to percussion,distant BS  Cardiovascular: RRR, heart sounds normal, no murmur or gallops, no peripheral edema  Abdomen: soft and NT, no HSM,  BS normal  Musculoskeletal: No deformities, no cyanosis or clubbing  Neuro: alert, non focal  Skin: Warm, no lesions or rashes      Cleda Daub 02/10/2012   FeV1 39% FeV1/FVC 67%  Fef 25 75 15%.     Assessment & Plan:   COPD Gold stage C. COPD Adverse side effects do to Symbicort Stable at this time Plan Maintain Symbicort one inhalation twice daily and Spiriva daily    Updated Medication List Outpatient Encounter Prescriptions as of 04/12/2012  Medication Sig Dispense Refill  . albuterol (PROAIR HFA) 108 (90 BASE) MCG/ACT inhaler Inhale 2 puffs into the lungs every 6 (six) hours as needed.        . Ascorbic Acid (VITAMIN C) 500 MG tablet Take 500 mg by mouth daily.        Marland Kitchen aspirin 81 MG tablet Twice weekly      . budesonide-formoterol (SYMBICORT) 160-4.5 MCG/ACT inhaler Inhale 1 puff into the lungs 2 (two) times daily.      Marland Kitchen diltiazem (CARDIZEM) 120 MG tablet Take by mouth 2 (two) times daily. Take 2 tablets in the morning and 1 in the evening      . fish oil-omega-3 fatty acids 1000 MG capsule Take 1 g by mouth daily.        Marland Kitchen HYDROcodone-acetaminophen (VICODIN) 5-500 MG per tablet Take 1 tablet by mouth every 12 (twelve) hours.       . Multiple Vitamin (MULTIVITAMIN) capsule Take 1 capsule by mouth daily.        . pregabalin (LYRICA) 150 MG capsule Take 150 mg by mouth 3 (three) times daily.       Marland Kitchen SPIRIVA HANDIHALER 18 MCG inhalation capsule inhale contents of 1 capsule by mouth once daily  30 capsule  3  . traMADol (ULTRAM) 50 MG  tablet Take 50 mg by mouth every 12 (twelve) hours.       Marland Kitchen DISCONTD: budesonide-formoterol (SYMBICORT) 160-4.5 MCG/ACT inhaler Inhale 2  puffs into the lungs 2 (two) times daily.  1 Inhaler  12  . DISCONTD: predniSONE (DELTASONE) 10 MG tablet Take 4 for three days 3 for three days 2 for three days 1 for three days and stop  30 tablet  0

## 2012-04-12 NOTE — Assessment & Plan Note (Signed)
Gold stage C. COPD Adverse side effects do to Symbicort Stable at this time Plan Maintain Symbicort one inhalation twice daily and Spiriva daily

## 2012-04-12 NOTE — Patient Instructions (Addendum)
Ok to stay on symbicort one puff twice daily Stay on Spiriva daily Return 6 months

## 2012-04-19 ENCOUNTER — Ambulatory Visit: Payer: Medicare Other | Admitting: Cardiology

## 2012-05-13 ENCOUNTER — Ambulatory Visit (INDEPENDENT_AMBULATORY_CARE_PROVIDER_SITE_OTHER): Payer: Medicare Other | Admitting: Cardiology

## 2012-05-13 ENCOUNTER — Encounter: Payer: Self-pay | Admitting: Cardiology

## 2012-05-13 VITALS — BP 150/70 | HR 95 | Ht 70.0 in | Wt 178.1 lb

## 2012-05-13 DIAGNOSIS — I4891 Unspecified atrial fibrillation: Secondary | ICD-10-CM

## 2012-05-13 MED ORDER — DILTIAZEM HCL 120 MG PO TABS: 240.0000 mg | ORAL_TABLET | Freq: Two times a day (BID) | ORAL | Status: DC

## 2012-05-13 MED ORDER — DILTIAZEM HCL 120 MG PO TABS: ORAL_TABLET | ORAL | Status: DC

## 2012-05-13 NOTE — Assessment & Plan Note (Signed)
Keep followup with Dr. Wright. 

## 2012-05-13 NOTE — Progress Notes (Signed)
   Clinical Summary Mr. Keaney is a 71 y.o.male presenting for followup. He was seen in July 2012. He reports no progressive palpitations, no chest pain. Has been following with Dr. Delford Field for COPD.  He continues on ASA and Cardizem CD with good control.  ECG today shows sinus rhythm.   No Known Allergies  Current Outpatient Prescriptions  Medication Sig Dispense Refill  . albuterol (PROAIR HFA) 108 (90 BASE) MCG/ACT inhaler Inhale 2 puffs into the lungs every 6 (six) hours as needed.        . Ascorbic Acid (VITAMIN C) 500 MG tablet Take 500 mg by mouth 3 (three) times a week.       Marland Kitchen aspirin 81 MG tablet Twice weekly      . budesonide-formoterol (SYMBICORT) 160-4.5 MCG/ACT inhaler Inhale 1 puff into the lungs 2 (two) times daily.      . fish oil-omega-3 fatty acids 1000 MG capsule Take 1 g by mouth 3 (three) times a week.       Marland Kitchen HYDROcodone-acetaminophen (VICODIN) 5-500 MG per tablet Take 1 tablet by mouth 3 (three) times daily.       . Multiple Vitamin (MULTIVITAMIN) capsule Take 1 capsule by mouth daily.        . pregabalin (LYRICA) 150 MG capsule Take 150 mg by mouth 3 (three) times daily.       Marland Kitchen SPIRIVA HANDIHALER 18 MCG inhalation capsule inhale contents of 1 capsule by mouth once daily  30 capsule  3  . traMADol (ULTRAM) 50 MG tablet Take 50 mg by mouth every 12 (twelve) hours.       Marland Kitchen DISCONTD: diltiazem (CARDIZEM) 120 MG tablet Take by mouth 2 (two) times daily. Take 2 tablets in the morning and 1 in the evening       . DISCONTD: diltiazem (CARDIZEM) 120 MG tablet Take 2 tablets (240 mg total) by mouth 2 (two) times daily. Take 2 tablets in the morning and 1 in the evening  90 tablet  11    Past Medical History  Diagnosis Date  . Prostate cancer     Status post radium seed implant  . COPD (chronic obstructive pulmonary disease)   . Mixed hyperlipidemia   . Atrial fibrillation   . Palpitations   . DJD (degenerative joint disease), lumbar   . Neuropathy     feet and legs      Social History Mr. Heslin reports that he quit smoking about 13 years ago. His smoking use included Cigarettes. He has a 80 pack-year smoking history. He has never used smokeless tobacco. Mr. Mineer reports that he does not drink alcohol.  Review of Systems No major bleeding episodes, no syncope. Stable appetite. No hospitalizations.  Physical Examination Filed Vitals:   05/13/12 1313  BP: 150/70  Pulse: 95   Comfortable in no acute distress.  HEENT: Conjuctivae and lids normal, oropharynx clear with moist mucosa.  Neck: Supple, no elevated JVP, no loud carotid bruits, no thyromegaly or tenderness.  Lungs: Nonlabored breathing at rest. Diminished course breath sounds but no wheezes.  Cor: PMI nondisplaced. RRR. No pathologic systolic murmurs. No S3 or rub.  Abd: Soft, NTND. No HSM. No bruits. Normoactive bowel sounds.  Ext: No CCE. Distal pulses 1+.    Problem List and Plan   ATRIAL FIBRILLATION, PAROXYSMAL Quiescent, in sinus rhythm today. Continue ASA and Cardizem for now. Refill provided.  COPD Keep followup with Dr. Delford Field.    Jonelle Sidle, M.D., F.A.C.C.

## 2012-05-13 NOTE — Patient Instructions (Signed)
Your physician recommends that you schedule a follow-up appointment in: 1 year  

## 2012-05-13 NOTE — Assessment & Plan Note (Signed)
Quiescent, in sinus rhythm today. Continue ASA and Cardizem for now. Refill provided.

## 2012-06-05 ENCOUNTER — Other Ambulatory Visit: Payer: Self-pay | Admitting: Critical Care Medicine

## 2012-06-25 ENCOUNTER — Encounter (HOSPITAL_COMMUNITY): Payer: Self-pay | Admitting: Emergency Medicine

## 2012-06-25 ENCOUNTER — Emergency Department (HOSPITAL_COMMUNITY): Payer: Medicare Other

## 2012-06-25 ENCOUNTER — Other Ambulatory Visit: Payer: Self-pay | Admitting: Cardiology

## 2012-06-25 ENCOUNTER — Emergency Department (HOSPITAL_COMMUNITY)
Admission: EM | Admit: 2012-06-25 | Discharge: 2012-06-25 | Disposition: A | Discharge status: Home / Self Care | Payer: Medicare Other | Attending: Emergency Medicine | Admitting: Emergency Medicine

## 2012-06-25 ENCOUNTER — Telehealth: Payer: Self-pay | Admitting: *Deleted

## 2012-06-25 DIAGNOSIS — R51 Headache: Secondary | ICD-10-CM | POA: Insufficient documentation

## 2012-06-25 DIAGNOSIS — E782 Mixed hyperlipidemia: Secondary | ICD-10-CM | POA: Insufficient documentation

## 2012-06-25 DIAGNOSIS — Z8546 Personal history of malignant neoplasm of prostate: Secondary | ICD-10-CM | POA: Insufficient documentation

## 2012-06-25 DIAGNOSIS — J441 Chronic obstructive pulmonary disease with (acute) exacerbation: Secondary | ICD-10-CM | POA: Insufficient documentation

## 2012-06-25 DIAGNOSIS — I4891 Unspecified atrial fibrillation: Secondary | ICD-10-CM | POA: Insufficient documentation

## 2012-06-25 DIAGNOSIS — R0989 Other specified symptoms and signs involving the circulatory and respiratory systems: Secondary | ICD-10-CM | POA: Insufficient documentation

## 2012-06-25 DIAGNOSIS — R0602 Shortness of breath: Secondary | ICD-10-CM | POA: Insufficient documentation

## 2012-06-25 DIAGNOSIS — Z7982 Long term (current) use of aspirin: Secondary | ICD-10-CM | POA: Insufficient documentation

## 2012-06-25 DIAGNOSIS — Z79899 Other long term (current) drug therapy: Secondary | ICD-10-CM | POA: Insufficient documentation

## 2012-06-25 DIAGNOSIS — R209 Unspecified disturbances of skin sensation: Secondary | ICD-10-CM | POA: Insufficient documentation

## 2012-06-25 LAB — CBC WITH DIFFERENTIAL/PLATELET
Basophils Absolute: 0 10*3/uL (ref 0.0–0.1)
HCT: 43.6 % (ref 39.0–52.0)
Hemoglobin: 15.9 g/dL (ref 13.0–17.0)
Lymphocytes Relative: 19 % (ref 12–46)
Monocytes Absolute: 0.6 10*3/uL (ref 0.1–1.0)
Monocytes Relative: 6 % (ref 3–12)
Neutro Abs: 7 10*3/uL (ref 1.7–7.7)
RDW: 12.8 % (ref 11.5–15.5)
WBC: 9.4 10*3/uL (ref 4.0–10.5)

## 2012-06-25 LAB — BASIC METABOLIC PANEL
CO2: 23 mEq/L (ref 19–32)
Chloride: 103 mEq/L (ref 96–112)
Creatinine, Ser: 0.95 mg/dL (ref 0.50–1.35)
Potassium: 3.7 mEq/L (ref 3.5–5.1)

## 2012-06-25 LAB — PRO B NATRIURETIC PEPTIDE: Pro B Natriuretic peptide (BNP): 33.7 pg/mL (ref 0–125)

## 2012-06-25 MED ORDER — PREDNISONE 20 MG PO TABS
60.0000 mg | ORAL_TABLET | Freq: Once | ORAL | Status: AC
  Administered 2012-06-25: 60 mg via ORAL
  Filled 2012-06-25: qty 3

## 2012-06-25 MED ORDER — ASPIRIN 81 MG PO CHEW
324.0000 mg | CHEWABLE_TABLET | Freq: Once | ORAL | Status: AC
  Administered 2012-06-25: 324 mg via ORAL
  Filled 2012-06-25: qty 4

## 2012-06-25 MED ORDER — IPRATROPIUM BROMIDE 0.02 % IN SOLN
0.5000 mg | Freq: Once | RESPIRATORY_TRACT | Status: AC
  Administered 2012-06-25: 0.5 mg via RESPIRATORY_TRACT
  Filled 2012-06-25: qty 2.5

## 2012-06-25 MED ORDER — DILTIAZEM HCL 120 MG PO TABS: ORAL_TABLET | ORAL | Status: DC

## 2012-06-25 MED ORDER — ALBUTEROL SULFATE (5 MG/ML) 0.5% IN NEBU
2.5000 mg | INHALATION_SOLUTION | Freq: Once | RESPIRATORY_TRACT | Status: AC
  Administered 2012-06-25: 2.5 mg via RESPIRATORY_TRACT
  Filled 2012-06-25: qty 0.5

## 2012-06-25 MED ORDER — PREDNISONE 50 MG PO TABS: 50.0000 mg | ORAL_TABLET | Freq: Every day | ORAL | Status: DC

## 2012-06-25 NOTE — ED Notes (Signed)
Pt c/o more increased sob since last night. Pt states she has stage 3 COPD.

## 2012-06-25 NOTE — ED Notes (Signed)
Reports relief from breathing tx.  States feels better and is breathing better.  States feels good to go home.  edp notified.

## 2012-06-25 NOTE — Telephone Encounter (Signed)
I received a refill request for this gentleman's Diltiazem ER 120 mg (2 caps in the am and 1 in the pm)  I just wanted to clarify that this is the way you want him to take it, prior to my filling it,

## 2012-06-25 NOTE — ED Provider Notes (Signed)
History    This chart was scribed for Dione Booze, MD, MD by Smitty Pluck. The patient was seen in room APA01 and the patient's care was started at 10:15AM.   CSN: [id number]  Arrival date & time 06/25/12  4098     Chief Complaint  Patient presents with  . Shortness of Breath    (Consider location/radiation/quality/duration/timing/severity/associated sxs/prior treatment) Patient is a 71 y.o. male presenting with shortness of breath. The history is provided by the patient. No language interpreter was used.  Shortness of Breath  Associated symptoms include shortness of breath.   [name] [name] is a 71 y.o. male with hx of COPD who presents to the Emergency Department complaining of constant, moderate SOB onset 1 day ago. Pt reports having mild, dull  frontal headache rated 3/10 and numbness in bilateral feet and bilateral hands. He denies numbness in lips. He states his mouth feels dry. Pt reports sitting up relieves headache and helps breathing. He denies chest pain, cough, fever, nausea, vomiting and diarrhea. Denies taking medication PTA. Pt reports taking asa.   PCP is Dr. Renard Matter   Past Medical History  Diagnosis Date  . Prostate cancer     Status post radium seed implant  . COPD (chronic obstructive pulmonary disease)   . Mixed hyperlipidemia   . Atrial fibrillation   . Palpitations   . DJD (degenerative joint disease), lumbar   . Neuropathy     feet and legs    Past Surgical History  Procedure Date  . Cataract extraction, bilateral 02/2011  . Radioactive seed implant   . Urethral dilation     multiple times -last 18 months ago  . Cystoscopy with urethral dilatation 09/15/2011    Procedure: CYSTOSCOPY WITH URETHRAL DILATATION;  Surgeon: Valetta Fuller, MD;  Location: [facility];  Service: Urology;  Laterality: N/A;  CYSTOSCOPY, BALLOON DILATION Rad tech ok by ann at [facility]    Family History  Problem Relation Age of Onset  . COPD Mother   . Heart  disease Brother     History  Substance Use Topics  . Smoking status: Former Smoker -- 2.0 packs/day for 40 years    Types: Cigarettes    Quit date: 09/15/1998  . Smokeless tobacco: Never Used  . Alcohol Use: No      Review of Systems  Respiratory: Positive for shortness of breath.   Neurological: Positive for numbness and headaches.  All other systems reviewed and are negative.    Allergies  Review of patient's allergies indicates no known allergies.  Home Medications   Current Outpatient Rx  Name Route Sig Dispense Refill  . ALBUTEROL SULFATE HFA 108 (90 BASE) MCG/ACT IN AERS Inhalation Inhale 2 puffs into the lungs every 6 (six) hours as needed.      Marland Kitchen VITAMIN C 500 MG PO TABS Oral Take 500 mg by mouth 3 (three) times a week.     . ASPIRIN 81 MG PO TABS  Twice weekly    . BUDESONIDE-FORMOTEROL FUMARATE 160-4.5 MCG/ACT IN AERO Inhalation Inhale 1 puff into the lungs 2 (two) times daily.    Marland Kitchen DILTIAZEM HCL 120 MG PO TABS  Take 2 tablets in the morning and 1 in the evening 90 tablet 3  . OMEGA-3 FATTY ACIDS 1000 MG PO CAPS Oral Take 1 g by mouth 3 (three) times a week.     Marland Kitchen HYDROCODONE-ACETAMINOPHEN 5-500 MG PO TABS Oral Take 1 tablet by mouth 3 (three) times daily.     Marland Kitchen  MULTIVITAMINS PO CAPS Oral Take 1 capsule by mouth daily.      Marland Kitchen PREGABALIN 150 MG PO CAPS Oral Take 150 mg by mouth 3 (three) times daily.     Marland Kitchen SPIRIVA HANDIHALER 18 MCG IN CAPS  inhale contents of 1 capsule by mouth once daily 30 each 6  . TRAMADOL HCL 50 MG PO TABS Oral Take 50 mg by mouth every 12 (twelve) hours.       BP 143/85  Pulse 86  Temp 98.2 F (36.8 C) (Oral)  Resp 22  Ht 5\' 10"  (1.778 m)  Wt 180 lb (81.647 kg)  BMI 25.83 kg/m2  SpO2 97%  Physical Exam  Nursing note and vitals reviewed. Constitutional: He is oriented to person, place, and time. He appears well-developed and well-nourished. No distress.  HENT:  Head: Normocephalic and atraumatic.  Eyes: EOM are normal. Pupils are  equal, round, and reactive to light.  Neck: Normal range of motion. Neck supple. No tracheal deviation present.  Cardiovascular: Normal rate, regular rhythm and normal heart sounds.   Pulmonary/Chest: Effort normal. No respiratory distress.       Distant breath sounds Rales at right base   Abdominal: Soft. He exhibits no distension.  Musculoskeletal: Normal range of motion.  Neurological: He is alert and oriented to person, place, and time.  Skin: Skin is warm and dry.  Psychiatric: He has a normal mood and affect. His behavior is normal.    ED Course  Procedures (including critical care time) DIAGNOSTIC STUDIES: Oxygen Saturation is 97% on room air, normal by my interpretation.    COORDINATION OF CARE: 10:20 AM Discussed ED treatment with pt  10:20 AM Ordered:   Medications  aspirin chewable tablet 324 mg (324 mg Oral Given 06/25/12 1026)  albuterol (PROVENTIL) (5 MG/ML) 0.5% nebulizer solution 2.5 mg (2.5 mg Nebulization Given 06/25/12 1029)  ipratropium (ATROVENT) nebulizer solution 0.5 mg (0.5 mg Nebulization Given 06/25/12 1029)    Results for orders placed during the hospital encounter of 06/25/12  CBC WITH DIFFERENTIAL      Component Value Range   WBC 9.4  4.0 - 10.5 K/uL   RBC 4.98  4.22 - 5.81 MIL/uL   Hemoglobin 15.9  13.0 - 17.0 g/dL   HCT 16.1  09.6 - 04.5 %   MCV 87.6  78.0 - 100.0 fL   MCH 31.9  26.0 - 34.0 pg   MCHC 36.5 (*) 30.0 - 36.0 g/dL   RDW 40.9  81.1 - 91.4 %   Platelets 225  150 - 400 K/uL   Neutrophils Relative 74  43 - 77 %   Neutro Abs 7.0  1.7 - 7.7 K/uL   Lymphocytes Relative 19  12 - 46 %   Lymphs Abs 1.8  0.7 - 4.0 K/uL   Monocytes Relative 6  3 - 12 %   Monocytes Absolute 0.6  0.1 - 1.0 K/uL   Eosinophils Relative 1  0 - 5 %   Eosinophils Absolute 0.1  0.0 - 0.7 K/uL   Basophils Relative 0  0 - 1 %   Basophils Absolute 0.0  0.0 - 0.1 K/uL  BASIC METABOLIC PANEL      Component Value Range   Sodium 138  135 - 145 mEq/L   Potassium  3.7  3.5 - 5.1 mEq/L   Chloride 103  96 - 112 mEq/L   CO2 23  19 - 32 mEq/L   Glucose, Bld 104 (*) 70 - 99 mg/dL  BUN 12  6 - 23 mg/dL   Creatinine, Ser 1.61  0.50 - 1.35 mg/dL   Calcium 09.6  8.4 - 04.5 mg/dL   GFR calc non Af Amer 82 (*) >90 mL/min   GFR calc Af Amer >90  >90 mL/min  PRO B NATRIURETIC PEPTIDE      Component Value Range   Pro B Natriuretic peptide (BNP) 33.7  0 - 125 pg/mL  TROPONIN I      Component Value Range   Troponin I <0.30  <0.30 ng/mL   Dg Chest 2 View  06/25/2012  *RADIOLOGY REPORT*  Clinical Data: Shortness of breath, COPD  CHEST - 2 VIEW  Comparison: 11/24/2008  Findings: Normal heart size, mediastinal contours, and pulmonary vascularity. Chronic peribronchial thickening and hyperinflation. No definite infiltrate, pleural effusion or pneumothorax. Right apex scarring stable. No acute osseous findings.  IMPRESSION: Chronic bronchitic and emphysematous changes consistent with COPD. No acute abnormalities.   Original Report Authenticated By: Lollie Marrow, M.D.      Date: 06/25/2012  Rate: 87  Rhythm: normal sinus rhythm  QRS Axis: normal  Intervals: normal  ST/T Wave abnormalities: normal  Conduction Disutrbances:none  Narrative Interpretation: RSR' in V1 and V2 suggesting right ventricular conduction delay.   Old EKG Reviewed: none available    1. COPD exacerbation       MDM  Progressive dyspnea which most likely represents exacerbation of COPD. He'll be given albuterol with Atrovent and chest x-ray obtained and then reassess..  1150: He got temporary improvement with Albuterol/Atrovent. Treatment will be repeated, and he is given a dose of Prednisone. Lab and x-rays are unremarkable.  1315: He feels much better after second nebulizer treatment. He is discharged with prescription for prednisone and is to follow PCP in the next week  I personally performed the services described in this documentation, which was scribed in my presence. The  recorded information has been reviewed and considered.      Dione Booze, MD 06/25/12 1334

## 2012-06-26 NOTE — Telephone Encounter (Signed)
Per last note he is on Cardizem CD 240 mg in AM (two 120 mg pills) and 120 mg in PM.

## 2012-06-28 ENCOUNTER — Other Ambulatory Visit: Payer: Self-pay | Admitting: *Deleted

## 2012-06-28 NOTE — Telephone Encounter (Signed)
Last OV was noted, however I still wanted to clarify, dose.  Will fill as directed.

## 2012-07-13 ENCOUNTER — Other Ambulatory Visit: Payer: Self-pay | Admitting: Cardiology

## 2012-07-13 MED ORDER — DILTIAZEM HCL 120 MG PO TABS: ORAL_TABLET | ORAL | Status: DC

## 2012-10-11 ENCOUNTER — Other Ambulatory Visit: Payer: Self-pay | Admitting: Cardiology

## 2012-10-12 ENCOUNTER — Ambulatory Visit (INDEPENDENT_AMBULATORY_CARE_PROVIDER_SITE_OTHER): Payer: Medicare Other | Admitting: Critical Care Medicine

## 2012-10-12 ENCOUNTER — Encounter: Payer: Self-pay | Admitting: Critical Care Medicine

## 2012-10-12 VITALS — BP 126/78 | HR 74 | Temp 98.0°F | Ht 70.0 in | Wt 176.4 lb

## 2012-10-12 DIAGNOSIS — J449 Chronic obstructive pulmonary disease, unspecified: Secondary | ICD-10-CM

## 2012-10-12 MED ORDER — FLUTICASONE FUROATE-VILANTEROL 100-25 MCG/INH IN AEPB: 1.0000 | INHALATION_SPRAY | Freq: Every day | RESPIRATORY_TRACT | Status: DC

## 2012-10-12 MED ORDER — DILTIAZEM HCL 120 MG PO TABS: ORAL_TABLET | ORAL | Status: DC

## 2012-10-12 NOTE — Telephone Encounter (Signed)
rx sent to pharmacy by e-script  

## 2012-10-12 NOTE — Patient Instructions (Addendum)
Stop Symbicort Start Breo one puff daily, call in two weeks with a report on how you are doing with Breo, use samples and free refill coupon with printed Rx Stay on Spiriva daily Return 3 months

## 2012-10-12 NOTE — Assessment & Plan Note (Signed)
Copd Stage C stable  Plan Stop Symbicort Start Breo one puff daily, call in two weeks with a report on how you are doing with Breo, use samples and free refill coupon with printed Rx Stay on Spiriva daily Return 3 months

## 2012-10-12 NOTE — Progress Notes (Signed)
Subjective:    Patient ID: [name] [name], male    DOB: 30-Sep-1940, 72 y.o.   MRN: [id number]  HPI  This is a  72 y.o.   WM with COPD Golds Stage C    04/12/2012 Pt not tolerating symbicort two puff twice daily, now on one puff twice daily.   Pt has a dry cough and feels tight.  Pt is still dyspneic with exertion, but ok on ADLs. No chest pain.  Notes edema in feet  10/12/2012 Pt had a reaction to flu vaccine and got chest tight, Pt went to Ed and then better. Now is congested all the time.  No mucus to bring up.  No chest pain.  No wheeze.  Dyspnea is the same.   Past Medical History  Diagnosis Date  . Prostate cancer     Status post radium seed implant  . COPD (chronic obstructive pulmonary disease)   . Mixed hyperlipidemia   . Atrial fibrillation   . Palpitations   . DJD (degenerative joint disease), lumbar   . Neuropathy     feet and legs     Family History  Problem Relation Age of Onset  . COPD Mother   . Heart disease Brother      History   Social History  . Marital Status: Married    Spouse Name: N/A    Number of Children: N/A  . Years of Education: N/A   Occupational History  . Not on file.   Social History Main Topics  . Smoking status: Former Smoker -- 2.0 packs/day for 40 years    Types: Cigarettes    Quit date: 09/15/1998  . Smokeless tobacco: Never Used  . Alcohol Use: No  . Drug Use: No  . Sexually Active: Not on file   Other Topics Concern  . Not on file   Social History Narrative  . No narrative on file     No Known Allergies   Outpatient Prescriptions Prior to Visit  Medication Sig Dispense Refill  . albuterol (PROAIR HFA) 108 (90 BASE) MCG/ACT inhaler Inhale 2 puffs into the lungs every 6 (six) hours as needed. For shortness of breath      . Ascorbic Acid (VITAMIN C) 500 MG tablet Take 500 mg by mouth daily.       Marland Kitchen aspirin 81 MG tablet Take 81 mg by mouth as directed. Twice weekly      . fish oil-omega-3 fatty acids 1000 MG capsule  Take 1 g by mouth daily.       Marland Kitchen HYDROcodone-acetaminophen (VICODIN) 5-500 MG per tablet Take 1 tablet by mouth 2 (two) times daily as needed.       . Multiple Vitamin (MULTIVITAMIN) capsule Take 1 capsule by mouth daily.        . pregabalin (LYRICA) 150 MG capsule Take 150 mg by mouth 3 (three) times daily.       Marland Kitchen SPIRIVA HANDIHALER 18 MCG inhalation capsule inhale contents of 1 capsule by mouth once daily  30 each  6  . traMADol (ULTRAM) 50 MG tablet Take 50 mg by mouth every 12 (twelve) hours.       . [DISCONTINUED] budesonide-formoterol (SYMBICORT) 160-4.5 MCG/ACT inhaler Inhale 1 puff into the lungs 2 (two) times daily.      . [DISCONTINUED] diltiazem (CARDIZEM) 120 MG tablet Take 2 tablets in the morning and 1 in the evening  90 tablet  3  . [DISCONTINUED] predniSONE (DELTASONE) 50 MG tablet Take 1 tablet (  50 mg total) by mouth daily.  5 tablet  0  Last reviewed on 10/12/2012  1:36 PM by Storm Frisk, MD    Review of Systems  Constitutional:   No  weight loss, night sweats,  Fevers, chills, fatigue, lassitude. HEENT:   No headaches,  Difficulty swallowing,  Tooth/dental problems,  Sore throat,                No sneezing, itching, ear ache, nasal congestion, post nasal drip,   CV:  No chest pain,  Orthopnea, PND, swelling in lower extremities, anasarca, dizziness, palpitations  GI  No heartburn, indigestion, abdominal pain, nausea, vomiting, diarrhea, change in bowel habits, loss of appetite  Resp: Notes  shortness of breath with exertion not  at rest.  No excess mucus, no productive cough,  Notes  non-productive cough,  No coughing up of blood.  No change in color of mucus.  No wheezing.  No chest wall deformity  Skin: no rash or lesions.  GU: no dysuria, change in color of urine, no urgency or frequency.  No flank pain.  MS:  No joint pain or swelling.  No decreased range of motion.  No back pain.  Psych:  No change in mood or affect. No depression or anxiety.  No memory  loss.     Objective:   Physical Exam  Filed Vitals:   10/12/12 1324  BP: 126/78  Pulse: 74  Temp: 98 F (36.7 C)  TempSrc: Oral  Height: 5\' 10"  (1.778 m)  Weight: 176 lb 6.4 oz (80.015 kg)  SpO2: 95%    Gen: Pleasant, well-nourished, in no distress,  normal affect  ENT: No lesions,  mouth clear,  oropharynx clear, no postnasal drip  Neck: No JVD, no TMG, no carotid bruits  Lungs: No use of accessory muscles, no dullness to percussion,distant BS  Cardiovascular: RRR, heart sounds normal, no murmur or gallops, no peripheral edema  Abdomen: soft and NT, no HSM,  BS normal  Musculoskeletal: No deformities, no cyanosis or clubbing  Neuro: alert, non focal  Skin: Warm, no lesions or rashes      Cleda Daub 02/10/2012   FeV1 39% FeV1/FVC 67%  Fef 25 75 15%.     Assessment & Plan:   COPD Copd Stage C stable  Plan Stop Symbicort Start Breo one puff daily, call in two weeks with a report on how you are doing with Breo, use samples and free refill coupon with printed Rx Stay on Spiriva daily Return 3 months     Updated Medication List Outpatient Encounter Prescriptions as of 10/12/2012  Medication Sig Dispense Refill  . albuterol (PROAIR HFA) 108 (90 BASE) MCG/ACT inhaler Inhale 2 puffs into the lungs every 6 (six) hours as needed. For shortness of breath      . Ascorbic Acid (VITAMIN C) 500 MG tablet Take 500 mg by mouth daily.       Marland Kitchen aspirin 81 MG tablet Take 81 mg by mouth as directed. Twice weekly      . fish oil-omega-3 fatty acids 1000 MG capsule Take 1 g by mouth daily.       Marland Kitchen HYDROcodone-acetaminophen (VICODIN) 5-500 MG per tablet Take 1 tablet by mouth 2 (two) times daily as needed.       . Multiple Vitamin (MULTIVITAMIN) capsule Take 1 capsule by mouth daily.        . pregabalin (LYRICA) 150 MG capsule Take 150 mg by mouth 3 (three) times daily.       Marland Kitchen  SPIRIVA HANDIHALER 18 MCG inhalation capsule inhale contents of 1 capsule by mouth once daily  30 each  6    . traMADol (ULTRAM) 50 MG tablet Take 50 mg by mouth every 12 (twelve) hours.       . [DISCONTINUED] budesonide-formoterol (SYMBICORT) 160-4.5 MCG/ACT inhaler Inhale 1 puff into the lungs 2 (two) times daily.      . [DISCONTINUED] diltiazem (CARDIZEM) 120 MG tablet Take 2 tablets in the morning and 1 in the evening  90 tablet  3  . Fluticasone Furoate-Vilanterol (BREO ELLIPTA) 100-25 MCG/INH AEPB Inhale 1 puff into the lungs daily.  30 each  2  . Fluticasone Furoate-Vilanterol (BREO ELLIPTA) 100-25 MCG/INH AEPB Inhale 1 puff into the lungs daily.  2 each  0  . [DISCONTINUED] predniSONE (DELTASONE) 50 MG tablet Take 1 tablet (50 mg total) by mouth daily.  5 tablet  0

## 2012-10-18 ENCOUNTER — Other Ambulatory Visit: Payer: Self-pay | Admitting: *Deleted

## 2012-10-20 ENCOUNTER — Telehealth: Payer: Self-pay | Admitting: *Deleted

## 2012-10-20 NOTE — Telephone Encounter (Signed)
Noted incoming fax from [facility] to request clarification on prescription sent for cardizem 120mg   Noted phone notation after last OV as follows:  Cathren Harsh, RN 06/28/2012 12:08 PM Signed  Last OV was noted, however I still wanted to clarify, dose. Will fill as directed. Nona Dell, MD 06/26/2012 7:06 AM Signed  Per last note he is on Cardizem CD 240 mg in AM (two 120 mg pills) and 120 mg in PM. Cathren Harsh, RN 06/25/2012 2:40 PM Signed  I received a refill request for this gentleman's Diltiazem ER 120 mg (2 caps in the am and 1 in the pm) I just wanted to clarify that this is the way you want him to take it, prior to my filling it,  Called pharmacy and gave verbal order from directions noted above by MD SM to pharmacy rep Jeanice Lim, she advised the tablets were sent in not the capsules, she advised they will fill the correct form for the pt based on verbal given as indicated from MD SM  Cardizem CD 240 mg in AM (two 120 mg pills) and 120 mg in PM.

## 2012-11-22 ENCOUNTER — Telehealth: Payer: Self-pay | Admitting: Critical Care Medicine

## 2012-11-22 NOTE — Telephone Encounter (Signed)
I spoke with pt and he stated the breo is doing well. He has not had to use his rescue inhaler any since using the breo. He has had mild symptoms of not being able to urinate but not sure if it is coming from the breo. Pt states he has RX on hand. Please advise Dr. Delford Field thanks

## 2012-11-22 NOTE — Telephone Encounter (Signed)
I spoke with pt and is aware of PW recs. He voiced his understanding and needed nothing further 

## 2012-11-22 NOTE — Telephone Encounter (Signed)
The urination issue is not from the breo He should fill Rx for [facility]

## 2012-12-13 ENCOUNTER — Other Ambulatory Visit: Payer: Self-pay | Admitting: Cardiology

## 2012-12-13 MED ORDER — DILTIAZEM HCL 120 MG PO TABS: ORAL_TABLET | ORAL | Status: DC

## 2012-12-14 ENCOUNTER — Telehealth: Payer: Self-pay

## 2012-12-14 MED ORDER — HYDROCODONE-ACETAMINOPHEN 5-325 MG PO TABS: 1.0000 | ORAL_TABLET | Freq: Three times a day (TID) | ORAL | Status: DC | PRN

## 2012-12-14 NOTE — Telephone Encounter (Signed)
Pharmacy calling because Hydrocodone is no longer avail in 5/500mg .  They would like a new rx for 5/325mg  please.  Thank you.

## 2012-12-28 ENCOUNTER — Other Ambulatory Visit: Payer: Self-pay | Admitting: Critical Care Medicine

## 2013-01-12 ENCOUNTER — Ambulatory Visit (INDEPENDENT_AMBULATORY_CARE_PROVIDER_SITE_OTHER): Payer: Medicare Other | Admitting: Critical Care Medicine

## 2013-01-12 ENCOUNTER — Encounter: Payer: Self-pay | Admitting: Critical Care Medicine

## 2013-01-12 VITALS — BP 134/80 | HR 69 | Temp 97.9°F | Ht 70.0 in | Wt 178.4 lb

## 2013-01-12 DIAGNOSIS — J449 Chronic obstructive pulmonary disease, unspecified: Secondary | ICD-10-CM

## 2013-01-12 MED ORDER — TIOTROPIUM BROMIDE MONOHYDRATE 18 MCG IN CAPS: ORAL_CAPSULE | RESPIRATORY_TRACT | Status: DC

## 2013-01-12 MED ORDER — BUDESONIDE-FORMOTEROL FUMARATE 160-4.5 MCG/ACT IN AERO: INHALATION_SPRAY | RESPIRATORY_TRACT | Status: DC

## 2013-01-12 MED ORDER — UMECLIDINIUM-VILANTEROL 62.5-25 MCG/INH IN AEPB: 1.0000 | INHALATION_SPRAY | Freq: Every day | RESPIRATORY_TRACT | Status: DC

## 2013-01-12 NOTE — Assessment & Plan Note (Signed)
Gold C copd, poor breo response Plan Trial Anoro  (vilanterol/umeclodiuum  Laba/lama) one puff daily D/c breo

## 2013-01-12 NOTE — Progress Notes (Signed)
Subjective:    Patient ID: [name] [name], male    DOB: 1941-04-02, 72 y.o.   MRN: [id number]  HPI  This is a  72 y.o.   WM with COPD Golds Stage C    01/12/2013 At last ov we rec: Change symbicort to breo>>>pt did NOT notice ANY difference in effect.  Pt did start back on symbicort.  Past Medical History  Diagnosis Date  . Prostate cancer     Status post radium seed implant  . COPD (chronic obstructive pulmonary disease)   . Mixed hyperlipidemia   . Atrial fibrillation   . Palpitations   . DJD (degenerative joint disease), lumbar   . Neuropathy     feet and legs     Family History  Problem Relation Age of Onset  . COPD Mother   . Heart disease Brother      History   Social History  . Marital Status: Married    Spouse Name: N/A    Number of Children: N/A  . Years of Education: N/A   Occupational History  . Not on file.   Social History Main Topics  . Smoking status: Former Smoker -- 2.00 packs/day for 40 years    Types: Cigarettes    Quit date: 09/15/1998  . Smokeless tobacco: Never Used  . Alcohol Use: No  . Drug Use: No  . Sexually Active: Not on file   Other Topics Concern  . Not on file   Social History Narrative  . No narrative on file     No Known Allergies   Outpatient Prescriptions Prior to Visit  Medication Sig Dispense Refill  . albuterol (PROAIR HFA) 108 (90 BASE) MCG/ACT inhaler Inhale 2 puffs into the lungs every 6 (six) hours as needed. For shortness of breath      . Ascorbic Acid (VITAMIN C) 500 MG tablet Take 500 mg by mouth daily.       Marland Kitchen aspirin 81 MG tablet Take 81 mg by mouth as directed. Twice weekly      . diltiazem (CARDIZEM) 120 MG tablet Take 2 tablets in the morning and 1 in the evening  90 tablet  2  . fish oil-omega-3 fatty acids 1000 MG capsule Take 1 g by mouth daily.       Marland Kitchen HYDROcodone-acetaminophen (NORCO/VICODIN) 5-325 MG per tablet Take 1 tablet by mouth every 8 (eight) hours as needed for pain.  90 tablet  0  .  Multiple Vitamin (MULTIVITAMIN) capsule Take 1 capsule by mouth daily.        . pregabalin (LYRICA) 150 MG capsule Take 150 mg by mouth 3 (three) times daily.       . traMADol (ULTRAM) 50 MG tablet Take 50 mg by mouth every 12 (twelve) hours.       Marland Kitchen SPIRIVA HANDIHALER 18 MCG inhalation capsule inhale the contents of one capsule in the handihaler once daily  30 capsule  6  . Fluticasone Furoate-Vilanterol (BREO ELLIPTA) 100-25 MCG/INH AEPB Inhale 1 puff into the lungs daily.  30 each  2  . Fluticasone Furoate-Vilanterol (BREO ELLIPTA) 100-25 MCG/INH AEPB Inhale 1 puff into the lungs daily.  2 each  0   No facility-administered medications prior to visit.      Review of Systems  Constitutional:   No  weight loss, night sweats,  Fevers, chills, fatigue, lassitude. HEENT:   No headaches,  Difficulty swallowing,  Tooth/dental problems,  Sore throat,  No sneezing, itching, ear ache, nasal congestion, post nasal drip,   CV:  No chest pain,  Orthopnea, PND, swelling in lower extremities, anasarca, dizziness, palpitations  GI  No heartburn, indigestion, abdominal pain, nausea, vomiting, diarrhea, change in bowel habits, loss of appetite  Resp: Notes  shortness of breath with exertion not  at rest.  No excess mucus, no productive cough,  Notes  non-productive cough,  No coughing up of blood.  No change in color of mucus.  No wheezing.  No chest wall deformity  Skin: no rash or lesions.  GU: no dysuria, change in color of urine, no urgency or frequency.  No flank pain.  MS:  No joint pain or swelling.  No decreased range of motion.  No back pain.  Psych:  No change in mood or affect. No depression or anxiety.  No memory loss.     Objective:   Physical Exam  Filed Vitals:   01/12/13 1049  BP: 134/80  Pulse: 69  Temp: 97.9 F (36.6 C)  TempSrc: Oral  Height: 5\' 10"  (1.778 m)  Weight: 178 lb 6.4 oz (80.922 kg)  SpO2: 95%    Gen: Pleasant, well-nourished, in no  distress,  normal affect  ENT: No lesions,  mouth clear,  oropharynx clear, no postnasal drip  Neck: No JVD, no TMG, no carotid bruits  Lungs: No use of accessory muscles, no dullness to percussion,distant BS  Cardiovascular: RRR, heart sounds normal, no murmur or gallops, no peripheral edema  Abdomen: soft and NT, no HSM,  BS normal  Musculoskeletal: No deformities, no cyanosis or clubbing  Neuro: alert, non focal  Skin: Warm, no lesions or rashes      Cleda Daub 02/10/2012   FeV1 39% FeV1/FVC 67%  Fef 25 75 15%.     Assessment & Plan:   COPD Gold C copd, poor breo response Plan Trial Anoro  (vilanterol/umeclodiuum  Laba/lama) one puff daily D/c breo     Updated Medication List Outpatient Encounter Prescriptions as of 01/12/2013  Medication Sig Dispense Refill  . albuterol (PROAIR HFA) 108 (90 BASE) MCG/ACT inhaler Inhale 2 puffs into the lungs every 6 (six) hours as needed. For shortness of breath      . Ascorbic Acid (VITAMIN C) 500 MG tablet Take 500 mg by mouth daily.       Marland Kitchen aspirin 81 MG tablet Take 81 mg by mouth as directed. Twice weekly      . budesonide-formoterol (SYMBICORT) 160-4.5 MCG/ACT inhaler HOLD WHILE ON ANORO TRIAL  1 Inhaler    . diltiazem (CARDIZEM) 120 MG tablet Take 2 tablets in the morning and 1 in the evening  90 tablet  2  . fish oil-omega-3 fatty acids 1000 MG capsule Take 1 g by mouth daily.       Marland Kitchen HYDROcodone-acetaminophen (NORCO/VICODIN) 5-325 MG per tablet Take 1 tablet by mouth every 8 (eight) hours as needed for pain.  90 tablet  0  . Multiple Vitamin (MULTIVITAMIN) capsule Take 1 capsule by mouth daily.        . pregabalin (LYRICA) 150 MG capsule Take 150 mg by mouth 3 (three) times daily.       Marland Kitchen tiotropium (SPIRIVA HANDIHALER) 18 MCG inhalation capsule inhale the contents of one capsule in the handihaler once daily HOLD WHILE ON ANORO TRIAL  30 capsule  6  . traMADol (ULTRAM) 50 MG tablet Take 50 mg by mouth every 12 (twelve) hours.        . [DISCONTINUED]  SPIRIVA HANDIHALER 18 MCG inhalation capsule inhale the contents of one capsule in the handihaler once daily  30 capsule  6  . [DISCONTINUED] SYMBICORT 160-4.5 MCG/ACT inhaler Inhale 1 puff into the lungs 2 (two) times daily.      Marland Kitchen Umeclidinium-Vilanterol (ANORO ELLIPTA) 62.5-25 MCG/INH AEPB Inhale 1 puff into the lungs daily.  30 each  0  . [DISCONTINUED] Fluticasone Furoate-Vilanterol (BREO ELLIPTA) 100-25 MCG/INH AEPB Inhale 1 puff into the lungs daily.  30 each  2  . [DISCONTINUED] Fluticasone Furoate-Vilanterol (BREO ELLIPTA) 100-25 MCG/INH AEPB Inhale 1 puff into the lungs daily.  2 each  0   No facility-administered encounter medications on file as of 01/12/2013.

## 2013-01-12 NOTE — Patient Instructions (Addendum)
Stop Spiriva, Breo and Symbicort Trial of ANORO one puff daily, call with report on symptoms in two weeks, use samples and one free refill Return 4 months

## 2013-01-17 ENCOUNTER — Encounter: Payer: Self-pay | Admitting: Neurology

## 2013-01-17 ENCOUNTER — Ambulatory Visit (INDEPENDENT_AMBULATORY_CARE_PROVIDER_SITE_OTHER): Payer: Medicare Other | Admitting: Neurology

## 2013-01-17 VITALS — BP 137/80 | HR 84 | Ht 70.0 in | Wt 177.0 lb

## 2013-01-17 DIAGNOSIS — C61 Malignant neoplasm of prostate: Secondary | ICD-10-CM

## 2013-01-17 DIAGNOSIS — E782 Mixed hyperlipidemia: Secondary | ICD-10-CM | POA: Insufficient documentation

## 2013-01-17 DIAGNOSIS — M5137 Other intervertebral disc degeneration, lumbosacral region: Secondary | ICD-10-CM

## 2013-01-17 DIAGNOSIS — G629 Polyneuropathy, unspecified: Secondary | ICD-10-CM | POA: Insufficient documentation

## 2013-01-17 DIAGNOSIS — Z8546 Personal history of malignant neoplasm of prostate: Secondary | ICD-10-CM | POA: Insufficient documentation

## 2013-01-17 DIAGNOSIS — G589 Mononeuropathy, unspecified: Secondary | ICD-10-CM

## 2013-01-17 DIAGNOSIS — M47816 Spondylosis without myelopathy or radiculopathy, lumbar region: Secondary | ICD-10-CM | POA: Insufficient documentation

## 2013-01-17 DIAGNOSIS — J449 Chronic obstructive pulmonary disease, unspecified: Secondary | ICD-10-CM

## 2013-01-17 DIAGNOSIS — M549 Dorsalgia, unspecified: Secondary | ICD-10-CM | POA: Insufficient documentation

## 2013-01-17 DIAGNOSIS — B029 Zoster without complications: Secondary | ICD-10-CM

## 2013-01-17 MED ORDER — OXCARBAZEPINE 150 MG PO TABS: 150.0000 mg | ORAL_TABLET | Freq: Two times a day (BID) | ORAL | Status: DC

## 2013-01-17 MED ORDER — HYDROCODONE-ACETAMINOPHEN 5-325 MG PO TABS: 1.0000 | ORAL_TABLET | Freq: Three times a day (TID) | ORAL | Status: DC

## 2013-01-17 NOTE — Progress Notes (Addendum)
HPI: Patient is a 72 yo right-handed Caucasian male,   for evaluation and continued management of his low back pain, bilateral lower extremity paresthesia.  In 2007, he has developed shingle involving his left lower thoracic to lower lumbar region, he experienced constant low back pain since then, at the beginning he also had shooting pain to his left lower extremity, 6 months later he began to experience similar hypersensitivity, burning tingling pain at his right lower extremity, over the years, he was evaluated and treated by pain management Dr. Ethelene Hal, initially improved by local injection, he also underwent physical therapy, without persistent help,   MRI of lumbar only reported mild degenerative disc disease, I do not have the report or film to review.  He was previously under the care of Dr. Ninetta Lights,  who has left the area. He is taking hydrocodone/Tylenol 5/500 t.i.d. tramadol 50 mg b.i.d., Lyrica 150 mg b.i.d. he wants to have this medicine filled  He describes constant low back achy pain,he has bilateral feet and below knee area numbness tingling burning pain, improved by rubbing or walking, his low back pain also improved by moving around, he denies gait difficulty, incontinence, bilateral upper extremity involvement.  Repeat EMG/[place] was mildly abnormal. 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 There is evidence of moderate bilateral carpal tunnel syndrome.    He also has problems with the right knee and he has a brace on. Because of knee pain he claims he does not exercise much. Numbness has gotten worse on feet. He had scar tissue removed from bladder neck since last seen.He continues to take hydrocodone, tramadol and Lyrica for his pain.    UPDATE May 5th 2014: He continues to have bilateral lower extremity and low back pain, bilateral feet paresthesia, numbness, stinging burning pain in bilateral  feet, He has no weakness. He is on polypharmacy treatment, including tramadol, Lyrica, hydrocodone. He wants to try lower dose of medications.    Review of Systems  Out of a complete 14 system review, the patient complains of only the following symptoms, and all other reviewed systems are negative.   Constitutional:   N/A Cardiovascular:  N/A Ear/Nose/Throat:  Ringing in ear Skin: N/A Eyes: N/A Respiratory: N/A Gastroitestinal: N/A    Hematology/Lymphatic:  Easy bruising Endocrine:  N/A Musculoskeletal:N/A Allergy/Immunology: N/A Neurological: numbness Psychiatric:    N/A    Physical Exam  Neck: supple no carotid bruits Respiratory: clear to auscultation bilaterally Cardiovascular: regular rate rhythm  Neurologic Exam  Mental Status: pleasant, awake, alert, cooperative to history, talking, and casual conversation. Cranial Nerves: CN II-XII pupils were equal round reactive to light.  Fundi were sharp bilaterally.  Extraocular movements were full.  Visual fields were full on confrontational test.  Facial sensation and strength were normal.  Hearing was intact to finger rubbing bilaterally.  Uvula tongue were midline.  Head turning and shoulder shrugging were normal and symmetric.  Tongue protrusion into the cheeks strength were normal.  Motor: Normal tone, bulk, and strength. Sensory: length dependent decreased light touch, pinprick to midcalf level, normal proprioception, and  decreased vibratory sensation at toes. Coordination: Normal finger-to-nose, heel-to-shin.  There was no dysmetria noticed. Gait and Station: Narrow based and steady, was able to perform tiptoe, heel, and tandem walking without difficulty.  Romberg sign: Negative Reflexes: Deep tendon reflexes: Biceps: 2/2, Brachioradialis: 2/2, Triceps: 2/2, Pateller: 2/2, Achilles:absent.  Plantar responses are flexor.   Assessment an Plan: 72  yo right-handed Caucasian male, with past medical history of shingles developed  chronic low back pain, and bilateral lower extremity paresthesia which has worsened slightly. EMG/[place] show peripheral neuropathy, no evidence of sacral radiculopathy. Bil CTS.   PLAN:  1. Refill Lyrica to 150mg  TID   2. Refills  on hydrocodone/apap 5/ 500 t.i.d., 3. Add on trileptal 150mg  bid. 4. RTC in 4 month with Eber Jones

## 2013-01-27 ENCOUNTER — Other Ambulatory Visit: Payer: Self-pay

## 2013-01-27 MED ORDER — PREGABALIN 150 MG PO CAPS: 150.0000 mg | ORAL_CAPSULE | Freq: Three times a day (TID) | ORAL | Status: DC

## 2013-03-11 ENCOUNTER — Other Ambulatory Visit: Payer: Self-pay | Admitting: Critical Care Medicine

## 2013-03-15 ENCOUNTER — Telehealth: Payer: Self-pay | Admitting: Critical Care Medicine

## 2013-03-15 NOTE — Telephone Encounter (Signed)
Per last OV with PW on 01/12/13:  Patient Instructions    Stop Spiriva, Breo and Symbicort  Trial of ANORO one puff daily, call with report on symptoms in two weeks, use samples and one free refill  Return 4 months   -----  lmomtcb for pt with his wife.  According to last OV, he was to stop symbicort.  Did he mean to request this rx?

## 2013-03-15 NOTE — Telephone Encounter (Signed)
Called patient and left message that no record of where anyone here tried to call him; if he had a name and possibly the nature of the call to please call us back. In the meantime, I am sending to Crystal to see if she may have called him for any reason; but would not get a response today as she is not in the office at this time. Will call patient back tomorrow.   Crystal, did you attempt to call this patient? Thanks.

## 2013-03-16 MED ORDER — TIOTROPIUM BROMIDE MONOHYDRATE 18 MCG IN CAPS: ORAL_CAPSULE | RESPIRATORY_TRACT | Status: DC

## 2013-03-16 NOTE — Telephone Encounter (Signed)
Per refill request from 6/27/1 for symbicort 160:  Call Documentation    [name] [name] Jyl Heinz, RN at 03/15/2013 9:02 AM    Status: Signed             Per last OV with PW on 01/12/13:     Patient Instructions      Stop Spiriva, Breo and Symbicort  Trial of ANORO one puff daily, call with report on symptoms in two weeks, use samples and one free refill  Return 4 months      -----  lmomtcb for pt with his wife. According to last OV, he was to stop symbicort. Did he mean to request this rx?    -------  Called, spoke with pt.  Pt states the anoro didn't seem to work.  At the end of the day he felt the need to have to use the rescue inhaler.  Because of this, he stopped the anoro and went back to taking the spiriva and symbicort 160 2 puffs bid.  Reports breathing is now back to baseline on spiriva and symbicort.  Dr. Delford Field, are you ok with pt restarting these two meds and stopping anoro?  He will need rx for the symbicort.  Pls advise.  Thank you.  [facility] [place]

## 2013-03-16 NOTE — Telephone Encounter (Signed)
Yes ,  Proceed to return to spiriva/symbicort

## 2013-03-16 NOTE — Telephone Encounter (Signed)
lmtcb x2 

## 2013-03-16 NOTE — Telephone Encounter (Signed)
Called, spoke with pt. He is aware PW ok with him to resume the symbicort and spiriva in place of anoro. I have sent rxs for both symbicort and spiriva to [facility]. Pt aware and voiced no further questions or concerns at this time.  ** Med list updated

## 2013-03-16 NOTE — Telephone Encounter (Signed)
Called, spoke with pt.  Pls see phone msg from 03/15/13 for additional information.

## 2013-03-16 NOTE — Telephone Encounter (Signed)
Pt returned call. He says he did not know why he was called and "no need to call back unless crystal needed to tell him something". Hazel Sams

## 2013-03-23 ENCOUNTER — Other Ambulatory Visit: Payer: Self-pay | Admitting: *Deleted

## 2013-03-23 MED ORDER — DILTIAZEM HCL 120 MG PO TABS: ORAL_TABLET | ORAL | Status: DC

## 2013-03-25 ENCOUNTER — Telehealth: Payer: Self-pay | Admitting: *Deleted

## 2013-03-25 NOTE — Telephone Encounter (Signed)
Pharmacy rep called to clarify medicine. Made the rep aware of the right med per note on 10/20/12. Will fill script for pt for cardizem.

## 2013-05-13 ENCOUNTER — Ambulatory Visit: Payer: Medicare Other | Admitting: Cardiology

## 2013-05-18 ENCOUNTER — Ambulatory Visit (INDEPENDENT_AMBULATORY_CARE_PROVIDER_SITE_OTHER): Payer: Medicare Other | Admitting: Critical Care Medicine

## 2013-05-18 ENCOUNTER — Encounter: Payer: Self-pay | Admitting: Critical Care Medicine

## 2013-05-18 VITALS — BP 130/80 | HR 65 | Temp 97.7°F | Ht 70.0 in | Wt 178.0 lb

## 2013-05-18 DIAGNOSIS — J449 Chronic obstructive pulmonary disease, unspecified: Secondary | ICD-10-CM

## 2013-05-18 DIAGNOSIS — Z23 Encounter for immunization: Secondary | ICD-10-CM

## 2013-05-18 MED ORDER — TIOTROPIUM BROMIDE MONOHYDRATE 18 MCG IN CAPS: ORAL_CAPSULE | RESPIRATORY_TRACT | Status: DC

## 2013-05-18 MED ORDER — ALBUTEROL SULFATE HFA 108 (90 BASE) MCG/ACT IN AERS: 2.0000 | INHALATION_SPRAY | Freq: Four times a day (QID) | RESPIRATORY_TRACT | Status: DC | PRN

## 2013-05-18 MED ORDER — BUDESONIDE-FORMOTEROL FUMARATE 160-4.5 MCG/ACT IN AERO: 1.0000 | INHALATION_SPRAY | Freq: Two times a day (BID) | RESPIRATORY_TRACT | Status: DC

## 2013-05-18 NOTE — Progress Notes (Signed)
Subjective:    Patient ID: [name] [name], male    DOB: [birth year], 72 y.o.   MRN: [id number]  HPI  This is a  72 y.o.   WM with COPD Golds Stage C    05/18/2013 Chief Complaint  Patient presents with  . 4 month follow up    Overall breathing is unchanged.  Does notice a difference with the humidity.  Does have some wheezing this morning.  No chest tightness, chest pain, or cough at this time.  Difficult time with heat and humidity.  No real cough or wheeze except this am only.  Dyspnea really unchanged.  No bronchitis flares since 12/2012  Pt denies any significant sore throat, nasal congestion or excess secretions, fever, chills, sweats, unintended weight loss, pleurtic or exertional chest pain, orthopnea PND, or leg swelling Pt denies any increase in rescue therapy over baseline, denies waking up needing it or having any early am or nocturnal exacerbations of coughing/wheezing/or dyspnea. Pt also denies any obvious fluctuation in symptoms with  weather or environmental change or other alleviating or aggravating factors    Past Medical History  Diagnosis Date  . Prostate cancer     Status post radium seed implant  . COPD (chronic obstructive pulmonary disease)   . Mixed hyperlipidemia   . Atrial fibrillation   . Palpitations   . DJD (degenerative joint disease), lumbar   . Neuropathy     feet and legs  . Shingles   . Back pain      Family History  Problem Relation Age of Onset  . COPD Mother   . Heart disease Brother      History   Social History  . Marital Status: Married    Spouse Name: N/A    Number of Children: N/A  . Years of Education: N/A   Occupational History  . Not on file.   Social History Main Topics  . Smoking status: Former Smoker -- 2.00 packs/day for 40 years    Types: Cigarettes    Quit date: 09/15/1998  . Smokeless tobacco: Never Used  . Alcohol Use: No  . Drug Use: No  . Sexual Activity: Not on file   Other Topics Concern  . Not on file    Social History Narrative   Patient lives at home with Roderic Scarce). Patient is retired and has three daughters and one step daughter. Patient has a 9th grade education.     No Known Allergies   Outpatient Prescriptions Prior to Visit  Medication Sig Dispense Refill  . Ascorbic Acid (VITAMIN C) 500 MG tablet Take 500 mg by mouth daily.       Marland Kitchen aspirin 81 MG tablet Take 81 mg by mouth as directed. Twice weekly      . diltiazem (CARDIZEM) 120 MG tablet Take 2 tablets in the morning and 1 in the evening  90 tablet  2  . fish oil-omega-3 fatty acids 1000 MG capsule Take 1 g by mouth daily.       Marland Kitchen HYDROcodone-acetaminophen (NORCO/VICODIN) 5-325 MG per tablet Take 1 tablet by mouth 3 (three) times daily.  90 tablet  5  . Multiple Vitamin (MULTIVITAMIN) capsule Take 1 capsule by mouth daily.        . OXcarbazepine (TRILEPTAL) 150 MG tablet Take 1 tablet (150 mg total) by mouth 2 (two) times daily.  60 tablet  12  . pregabalin (LYRICA) 150 MG capsule Take 1 capsule (150 mg total) by mouth 3 (three) times daily.  90 capsule  5  . albuterol (PROAIR HFA) 108 (90 BASE) MCG/ACT inhaler Inhale 2 puffs into the lungs every 6 (six) hours as needed. For shortness of breath      . budesonide-formoterol (SYMBICORT) 160-4.5 MCG/ACT inhaler HOLD WHILE ON ANORO TRIAL  1 Inhaler    . SYMBICORT 160-4.5 MCG/ACT inhaler inhale 2 puffs by mouth twice a day  10.2 g  3  . tiotropium (SPIRIVA HANDIHALER) 18 MCG inhalation capsule inhale the contents of one capsule in the handihaler once daily  30 capsule  3   No facility-administered medications prior to visit.      Review of Systems  Constitutional:   No  weight loss, night sweats,  Fevers, chills, fatigue, lassitude. HEENT:   No headaches,  Difficulty swallowing,  Tooth/dental problems,  Sore throat,                No sneezing, itching, ear ache, nasal congestion, post nasal drip,   CV:  No chest pain,  Orthopnea, PND, swelling in lower extremities, anasarca,  dizziness, palpitations  GI  No heartburn, indigestion, abdominal pain, nausea, vomiting, diarrhea, change in bowel habits, loss of appetite  Resp: Notes  shortness of breath with exertion not  at rest.  No excess mucus, no productive cough,  Notes  non-productive cough,  No coughing up of blood.  No change in color of mucus.  No wheezing.  No chest wall deformity  Skin: no rash or lesions.  GU: no dysuria, change in color of urine, no urgency or frequency.  No flank pain.  MS:  No joint pain or swelling.  No decreased range of motion.  No back pain.  Psych:  No change in mood or affect. No depression or anxiety.  No memory loss.     Objective:   Physical Exam  Filed Vitals:   05/18/13 0926  BP: 130/80  Pulse: 65  Temp: 97.7 F (36.5 C)  TempSrc: Oral  Height: 5\' 10"  (1.778 m)  Weight: 178 lb (80.74 kg)  SpO2: 95%    Gen: Pleasant, well-nourished, in no distress,  normal affect  ENT: No lesions,  mouth clear,  oropharynx clear, no postnasal drip  Neck: No JVD, no TMG, no carotid bruits  Lungs: No use of accessory muscles, no dullness to percussion,distant BS  Cardiovascular: RRR, heart sounds normal, no murmur or gallops, no peripheral edema  Abdomen: soft and NT, no HSM,  BS normal  Musculoskeletal: No deformities, no cyanosis or clubbing  Neuro: alert, non focal  Skin: Warm, no lesions or rashes      Assessment & Plan:   COPD Gold C  Gold stage C. COPD stable at this time Plan Resume Symbicort one puff twice a day Continue Spiriva daily Discontinued ANORO Flu and pneumovax given    Updated Medication List Outpatient Encounter Prescriptions as of 05/18/2013  Medication Sig Dispense Refill  . albuterol (PROAIR HFA) 108 (90 BASE) MCG/ACT inhaler Inhale 2 puffs into the lungs every 6 (six) hours as needed for shortness of breath. For shortness of breath  1 Inhaler  4  . Ascorbic Acid (VITAMIN C) 500 MG tablet Take 500 mg by mouth daily.       Marland Kitchen aspirin  81 MG tablet Take 81 mg by mouth as directed. Twice weekly      . budesonide-formoterol (SYMBICORT) 160-4.5 MCG/ACT inhaler Inhale 1 puff into the lungs 2 (two) times daily.  1 Inhaler  11  . diltiazem (CARDIZEM) 120 MG tablet  Take 2 tablets in the morning and 1 in the evening  90 tablet  2  . fish oil-omega-3 fatty acids 1000 MG capsule Take 1 g by mouth daily.       Marland Kitchen HYDROcodone-acetaminophen (NORCO/VICODIN) 5-325 MG per tablet Take 1 tablet by mouth 3 (three) times daily.  90 tablet  5  . Multiple Vitamin (MULTIVITAMIN) capsule Take 1 capsule by mouth daily.        . OXcarbazepine (TRILEPTAL) 150 MG tablet Take 1 tablet (150 mg total) by mouth 2 (two) times daily.  60 tablet  12  . pregabalin (LYRICA) 150 MG capsule Take 1 capsule (150 mg total) by mouth 3 (three) times daily.  90 capsule  5  . tiotropium (SPIRIVA HANDIHALER) 18 MCG inhalation capsule inhale the contents of one capsule in the handihaler once daily  30 capsule  11  . [DISCONTINUED] albuterol (PROAIR HFA) 108 (90 BASE) MCG/ACT inhaler Inhale 2 puffs into the lungs every 6 (six) hours as needed. For shortness of breath      . [DISCONTINUED] budesonide-formoterol (SYMBICORT) 160-4.5 MCG/ACT inhaler HOLD WHILE ON ANORO TRIAL  1 Inhaler    . [DISCONTINUED] budesonide-formoterol (SYMBICORT) 160-4.5 MCG/ACT inhaler Inhale 1 puff into the lungs 2 (two) times daily.       . [DISCONTINUED] SYMBICORT 160-4.5 MCG/ACT inhaler inhale 2 puffs by mouth twice a day  10.2 g  3  . [DISCONTINUED] tiotropium (SPIRIVA HANDIHALER) 18 MCG inhalation capsule inhale the contents of one capsule in the handihaler once daily  30 capsule  3   No facility-administered encounter medications on file as of 05/18/2013.

## 2013-05-18 NOTE — Patient Instructions (Addendum)
Flu vaccine and pneumovax will be given No change in medications Return 4 months

## 2013-05-18 NOTE — Assessment & Plan Note (Signed)
Gold stage C. COPD stable at this time Plan Resume Symbicort one puff twice a day Continue Spiriva daily Discontinued ANORO Flu and pneumovax given

## 2013-05-20 ENCOUNTER — Ambulatory Visit (INDEPENDENT_AMBULATORY_CARE_PROVIDER_SITE_OTHER): Payer: Medicare Other | Admitting: Neurology

## 2013-05-20 ENCOUNTER — Encounter: Payer: Self-pay | Admitting: Neurology

## 2013-05-20 VITALS — BP 128/75 | HR 72 | Ht 70.0 in | Wt 176.0 lb

## 2013-05-20 DIAGNOSIS — I4891 Unspecified atrial fibrillation: Secondary | ICD-10-CM

## 2013-05-20 DIAGNOSIS — B029 Zoster without complications: Secondary | ICD-10-CM

## 2013-05-20 DIAGNOSIS — M47816 Spondylosis without myelopathy or radiculopathy, lumbar region: Secondary | ICD-10-CM

## 2013-05-20 DIAGNOSIS — C61 Malignant neoplasm of prostate: Secondary | ICD-10-CM

## 2013-05-20 DIAGNOSIS — M5137 Other intervertebral disc degeneration, lumbosacral region: Secondary | ICD-10-CM

## 2013-05-20 MED ORDER — HYDROCODONE-ACETAMINOPHEN 5-325 MG PO TABS: 1.0000 | ORAL_TABLET | Freq: Three times a day (TID) | ORAL | Status: DC

## 2013-05-20 MED ORDER — PREGABALIN 150 MG PO CAPS: 150.0000 mg | ORAL_CAPSULE | Freq: Three times a day (TID) | ORAL | Status: DC

## 2013-05-20 MED ORDER — OXCARBAZEPINE 150 MG PO TABS: 150.0000 mg | ORAL_TABLET | Freq: Two times a day (BID) | ORAL | Status: DC

## 2013-05-20 NOTE — Progress Notes (Signed)
HPI: Patient is a 72 yo right-handed Caucasian male,  for evaluation and continued management of his low back pain, bilateral lower extremity paresthesia.  In 2007, he has developed shingle involving his left lower thoracic to lower lumbar region, he experienced constant low back pain since then, at the beginning he also had shooting pain to his left lower extremity, 6 months later he began to experience similar hypersensitivity, burning tingling pain at his right lower extremity, over the years, he was evaluated and treated by pain management Dr. Ethelene Hal, initially improved by local injection, he also underwent physical therapy, without persistent help,   MRI of lumbar only reported mild degenerative disc disease, I do not have the report or film to review.  He was previously under the care of Dr. Ninetta Lights,  who has left the area. He is taking hydrocodone/Tylenol 5/500 t.i.d. tramadol 50 mg b.i.d., Lyrica 150 mg b.i.d. he wants to have this medicine filled. Extensive lab evaluation, 9 vials were normal.   He describes constant low back achy pain,he has bilateral feet and below knee area numbness tingling burning pain, improved by rubbing or walking, his low back pain also improved by moving around, he denies gait difficulty, incontinence, bilateral upper extremity involvement.  Repeat EMG/[place] was mildly abnormal. 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 There is evidence of moderate bilateral carpal tunnel syndrome.    He also has problems with the right knee and he has a brace on. Because of knee pain he claims he does not exercise much. Numbness has gotten worse on feet. He had scar tissue removed from bladder neck since last seen.He continues to take hydrocodone, tramadol and Lyrica for his pain.    UPDATE May 5th 2014: He continues to have bilateral lower extremity and low back pain, bilateral feet paresthesia,  numbness, stinging burning pain in bilateral feet, He has no weakness. He is on polypharmacy treatment, including tramadol, Lyrica, hydrocodone. He wants to try lower dose of medications.   UPDATE Sept 5th 2014: He still complains of bilateral feet and from knee down, numbness, tingling burning, he does not have significant lower extremity weakness, still has low back pain.  He does not have bilateral upper extremity symptoms, no incontinence.  Trileptal 150mg  bid did not make much difference.  Review of Systems  Out of a complete 14 system review, the patient complains of only the following symptoms, and all other reviewed systems are negative.   Constitutional:   N/A Cardiovascular:  N/A Ear/Nose/Throat:  Ringing in ear Skin: N/A Eyes: N/A Respiratory: N/A Gastroitestinal: N/A    Hematology/Lymphatic:  Easy bruising Endocrine:  N/A Musculoskeletal:N/A Allergy/Immunology: N/A Neurological: numbness Psychiatric:    N/A    Physical Exam  Neck: supple no carotid bruits Respiratory: clear to auscultation bilaterally Cardiovascular: regular rate rhythm  Neurologic Exam  Mental Status: pleasant, awake, alert, cooperative to history, talking, and casual conversation. Cranial Nerves: CN II-XII pupils were equal round reactive to light.  Fundi were sharp bilaterally.  Extraocular movements were full.  Visual fields were full on confrontational test.  Facial sensation and strength were normal.  Hearing was intact to finger rubbing bilaterally.  Uvula tongue were midline.  Head turning and shoulder shrugging were normal and symmetric.  Tongue protrusion into the cheeks strength were normal.  Motor: Normal tone, bulk, and strength. Sensory: length dependent decreased light touch, pinprick to midcalf level, normal proprioception, and  decreased vibratory sensation at  toes. Coordination: Normal finger-to-nose, heel-to-shin.  There was no dysmetria noticed. Gait and Station: Narrow based and  steady, was able to perform tiptoe, heel, and tandem walking without difficulty.  Romberg sign: Negative Reflexes: Deep tendon reflexes: Biceps: 2/2, Brachioradialis: 2/2, Triceps: 2/2, Pateller: 2/2, Achilles:absent.  Plantar responses are flexor.   Assessment an Plan: 72 yo right-handed Caucasian male, with past medical history of shingles developed chronic low back pain, and bilateral lower extremity paresthesia which has worsened slightly. EMG/[place] show peripheral neuropathy, no evidence of sacral radiculopathy. Bilateral carpal tunnel syndrome.   PLAN:  1. Refill Lyrica to 150mg  TID   2. Refills  on hydrocodone/apap 5/ 500 t.i.d., 3. Add on trileptal 150mg  bid. 4. He continues to complains of constant pain, wants more hydrocodone, I will refer him to pain management Dr. Thyra Breed.

## 2013-05-24 ENCOUNTER — Ambulatory Visit (INDEPENDENT_AMBULATORY_CARE_PROVIDER_SITE_OTHER): Payer: Medicare Other | Admitting: Cardiology

## 2013-05-24 ENCOUNTER — Encounter: Payer: Self-pay | Admitting: Cardiology

## 2013-05-24 VITALS — BP 144/77 | HR 82 | Ht 70.0 in | Wt 178.0 lb

## 2013-05-24 DIAGNOSIS — J449 Chronic obstructive pulmonary disease, unspecified: Secondary | ICD-10-CM

## 2013-05-24 DIAGNOSIS — R011 Cardiac murmur, unspecified: Secondary | ICD-10-CM

## 2013-05-24 DIAGNOSIS — I4891 Unspecified atrial fibrillation: Secondary | ICD-10-CM

## 2013-05-24 NOTE — Assessment & Plan Note (Signed)
Keep followup with Dr. Wright. 

## 2013-05-24 NOTE — Patient Instructions (Addendum)
Your physician recommends that you schedule a follow-up appointment in: ONE YEAR  WE HAVE CALLED YOUR PHARMACY AND GIVEN VERBAL INSTRUCTIONS FOR YOU TO HAVE DILTIAZEM 24HR ER 120MG  #90 WITH 11 REFILLS TO BE TAKEN TWO TABLETS EVERY MORNING AND ONE TABLET EVERY EVENING, WE SPOKE TO [facility] REP [place]

## 2013-05-24 NOTE — Progress Notes (Signed)
Clinical Summary [name] [name] is a 72 y.o.male last seen in August 2013. He reports occasional palpitations, no prolonged episodes, no cardiac hospitalizations. We reviewed his medications including Cardizem ER 120 mg, 2 tablets in the morning and one in the evening. He continues on aspirin. He still has a relatively low thromboembolic risk score.  ECG today shows sinus rhythm.   No Known Allergies  Current Outpatient Prescriptions  Medication Sig Dispense Refill  . Ascorbic Acid (VITAMIN C) 500 MG tablet Take 500 mg by mouth daily.       Marland Kitchen aspirin 81 MG tablet Take 81 mg by mouth as directed. Twice weekly      . budesonide-formoterol (SYMBICORT) 160-4.5 MCG/ACT inhaler Inhale 1 puff into the lungs 2 (two) times daily.  1 Inhaler  11  . diltiazem (CARDIZEM) 120 MG tablet Take 2 tablets in the morning and 1 in the evening  90 tablet  2  . fish oil-omega-3 fatty acids 1000 MG capsule Take 1 g by mouth daily.       Marland Kitchen HYDROcodone-acetaminophen (NORCO/VICODIN) 5-325 MG per tablet Take 1 tablet by mouth 3 (three) times daily.  90 tablet  5  . Multiple Vitamin (MULTIVITAMIN) capsule Take 1 capsule by mouth daily.        . OXcarbazepine (TRILEPTAL) 150 MG tablet Take 1 tablet (150 mg total) by mouth 2 (two) times daily.  60 tablet  12  . pregabalin (LYRICA) 150 MG capsule Take 1 capsule (150 mg total) by mouth 3 (three) times daily.  90 capsule  12  . PROAIR HFA 108 (90 BASE) MCG/ACT inhaler       . tiotropium (SPIRIVA HANDIHALER) 18 MCG inhalation capsule inhale the contents of one capsule in the handihaler once daily  30 capsule  11   No current facility-administered medications for this visit.    Past Medical History  Diagnosis Date  . Prostate cancer     Status post radium seed implant  . COPD (chronic obstructive pulmonary disease)   . Mixed hyperlipidemia   . Atrial fibrillation   . Palpitations   . DJD (degenerative joint disease), lumbar   . Neuropathy     feet and legs  .  Shingles   . Back pain     Social History Mr. Husak reports that he quit smoking about 14 years ago. His smoking use included Cigarettes. He has a 80 pack-year smoking history. He has never used smokeless tobacco. Mr. Escamilla reports that he does not drink alcohol.  Review of Systems Chronic shortness of breath with COPD. Tired some days. No exertional chest pain. Otherwise negative.  Physical Examination Filed Vitals:   05/24/13 1502  BP: 144/77  Pulse: 82   Filed Weights   05/24/13 1502  Weight: 178 lb (80.74 kg)    Comfortable in no acute distress.  HEENT: Conjuctivae and lids normal, oropharynx clear with moist mucosa.  Neck: Supple, no elevated JVP, no loud carotid bruits, no thyromegaly or tenderness.  Lungs: Nonlabored breathing at rest. Diminished course breath sounds but no wheezes.  Cor: PMI nondisplaced. RRR. No pathologic systolic murmurs. No S3 or rub.  Abd: Soft, NTND. Normoactive bowel sounds.  Ext: No edema. Distal pulses 1+.    Problem List and Plan   ATRIAL FIBRILLATION, PAROXYSMAL No prolonged episodes, maintaining sinus rhythm by ECG today. Continue current regimen including aspirin and long-acting Cardizem. Still has a relatively low thromboembolic risk score. Followup arranged.  COPD Gold C  Keep follow up with  Dr. Delford Field.    Jonelle Sidle, M.D., F.A.C.C.

## 2013-05-24 NOTE — Assessment & Plan Note (Signed)
No prolonged episodes, maintaining sinus rhythm by ECG today. Continue current regimen including aspirin and long-acting Cardizem. Still has a relatively low thromboembolic risk score. Followup arranged.

## 2013-07-18 ENCOUNTER — Other Ambulatory Visit: Payer: Self-pay

## 2013-07-18 MED ORDER — PREGABALIN 150 MG PO CAPS: 150.0000 mg | ORAL_CAPSULE | Freq: Three times a day (TID) | ORAL | Status: DC

## 2013-07-18 NOTE — Telephone Encounter (Signed)
Rx faxed to [phone]. Patient notified.

## 2013-08-30 ENCOUNTER — Other Ambulatory Visit (HOSPITAL_COMMUNITY): Payer: Self-pay | Admitting: Physical Medicine and Rehabilitation

## 2013-08-30 DIAGNOSIS — IMO0002 Reserved for concepts with insufficient information to code with codable children: Secondary | ICD-10-CM

## 2013-09-02 ENCOUNTER — Ambulatory Visit (HOSPITAL_COMMUNITY)
Admission: RE | Admit: 2013-09-02 | Discharge: 2013-09-02 | Disposition: A | Discharge status: Home / Self Care | Payer: Medicare Other | Source: Ambulatory Visit | Attending: Physical Medicine and Rehabilitation | Admitting: Physical Medicine and Rehabilitation

## 2013-09-02 DIAGNOSIS — M545 Low back pain, unspecified: Secondary | ICD-10-CM | POA: Insufficient documentation

## 2013-09-02 DIAGNOSIS — M79609 Pain in unspecified limb: Secondary | ICD-10-CM | POA: Insufficient documentation

## 2013-09-02 DIAGNOSIS — M51379 Other intervertebral disc degeneration, lumbosacral region without mention of lumbar back pain or lower extremity pain: Secondary | ICD-10-CM | POA: Insufficient documentation

## 2013-09-02 DIAGNOSIS — M5137 Other intervertebral disc degeneration, lumbosacral region: Secondary | ICD-10-CM | POA: Insufficient documentation

## 2013-09-02 DIAGNOSIS — IMO0002 Reserved for concepts with insufficient information to code with codable children: Secondary | ICD-10-CM

## 2013-10-10 ENCOUNTER — Encounter: Payer: Self-pay | Admitting: Critical Care Medicine

## 2013-10-10 ENCOUNTER — Ambulatory Visit (INDEPENDENT_AMBULATORY_CARE_PROVIDER_SITE_OTHER): Payer: Medicare Other | Admitting: Critical Care Medicine

## 2013-10-10 VITALS — BP 128/76 | HR 79 | Temp 97.8°F | Ht 70.0 in | Wt 180.0 lb

## 2013-10-10 DIAGNOSIS — J449 Chronic obstructive pulmonary disease, unspecified: Secondary | ICD-10-CM

## 2013-10-10 NOTE — Assessment & Plan Note (Signed)
Gold stage C. COPD with under dosing of Symbicort at this time Plan Increase Symbicort 2 puff twice daily Maintain Spiriva Note this patient will need Prevnar 13 vaccine in August 2015

## 2013-10-10 NOTE — Progress Notes (Signed)
Subjective:    Patient ID: [name] [name], male    DOB: 1940-11-15, 73 y.o.   MRN: [id number]  HPI  This is a  73 y.o.   WM with COPD Golds Stage C    10/10/2013 Chief Complaint  Patient presents with  . 4 month follow up    Breathing is unchanged and comes and goes depending on the weather.  Has DOE, wheezing at times, and dry cough at times.   Notes more congestion over 4 months, comes and goes.  Notes some wheezing.  Dry cough only. No chest pain. Notes mild indigestion  CAT Score 10/10/2013 01/12/2013 10/12/2012  Total CAT Score 20 11 15        Past Medical History  Diagnosis Date  . Prostate cancer     Status post radium seed implant  . COPD (chronic obstructive pulmonary disease)   . Mixed hyperlipidemia   . Atrial fibrillation   . Palpitations   . DJD (degenerative joint disease), lumbar   . Neuropathy     feet and legs  . Shingles   . Back pain      Family History  Problem Relation Age of Onset  . COPD Mother   . Heart disease Brother      History   Social History  . Marital Status: Married    Spouse Name: [name]    Number of Children: 3  . Years of Education: 9 th   Occupational History  .      Retired   Social History Main Topics  . Smoking status: Former Smoker -- 2.00 packs/day for 40 years    Types: Cigarettes    Quit date: 09/15/1998  . Smokeless tobacco: Never Used  . Alcohol Use: No  . Drug Use: No  . Sexual Activity: Not on file   Other Topics Concern  . Not on file   Social History Narrative   Patient lives at home with [name]). Patient is retired and has three daughters and one step daughter. Patient has a 9th grade education.   Right handed.   Caffeine- three  cups daily.     No Known Allergies   Outpatient Prescriptions Prior to Visit  Medication Sig Dispense Refill  . Ascorbic Acid (VITAMIN C) 500 MG tablet Take 500 mg by mouth daily.       Marland Kitchen aspirin 81 MG tablet Take 81 mg by mouth as directed. Twice weekly      .  budesonide-formoterol (SYMBICORT) 160-4.5 MCG/ACT inhaler Inhale 1 puff into the lungs 2 (two) times daily.  1 Inhaler  11  . diltiazem (CARDIZEM) 120 MG tablet Take 2 tablets in the morning and 1 in the evening  90 tablet  2  . fish oil-omega-3 fatty acids 1000 MG capsule Take 1 g by mouth daily.       . Multiple Vitamin (MULTIVITAMIN) capsule Take 1 capsule by mouth daily.        . pregabalin (LYRICA) 150 MG capsule Take 1 capsule (150 mg total) by mouth 3 (three) times daily.  90 capsule  12  . PROAIR HFA 108 (90 BASE) MCG/ACT inhaler Inhale 2 puffs into the lungs every 6 (six) hours as needed.       . tiotropium (SPIRIVA HANDIHALER) 18 MCG inhalation capsule inhale the contents of one capsule in the handihaler once daily  30 capsule  11  . HYDROcodone-acetaminophen (NORCO/VICODIN) 5-325 MG per tablet Take 1 tablet by mouth 3 (three) times daily.  90 tablet  5  . OXcarbazepine (TRILEPTAL) 150 MG tablet Take 1 tablet (150 mg total) by mouth 2 (two) times daily.  60 tablet  12   No facility-administered medications prior to visit.      Review of Systems  Constitutional:   No  weight loss, night sweats,  Fevers, chills, fatigue, lassitude. HEENT:   No headaches,  Difficulty swallowing,  Tooth/dental problems,  Sore throat,                No sneezing, itching, ear ache,+ nasal congestion, +post nasal drip,   CV:  No chest pain,  Orthopnea, PND, swelling in lower extremities, anasarca, dizziness, palpitations  GI  No heartburn, ++indigestion, abdominal pain, nausea, vomiting, diarrhea, change in bowel habits, loss of appetite  Resp: Notes  shortness of breath with exertion not  at rest.  No excess mucus, no productive cough,  Notes  non-productive cough,  No coughing up of blood.  No change in color of mucus.  No wheezing.  No chest wall deformity  Skin: no rash or lesions.  GU: no dysuria, change in color of urine, no urgency or frequency.  No flank pain.  MS:  No joint pain or  swelling.  No decreased range of motion.  No back pain.  Psych:  No change in mood or affect. No depression or anxiety.  No memory loss.     Objective:   Physical Exam  Filed Vitals:   10/10/13 1025  BP: 128/76  Pulse: 79  Temp: 97.8 F (36.6 C)  TempSrc: Oral  Height: 5\' 10"  (1.778 m)  Weight: 180 lb (81.647 kg)  SpO2: 95%    Gen: Pleasant, well-nourished, in no distress,  normal affect  ENT: No lesions,  mouth clear,  oropharynx clear, no postnasal drip  Neck: No JVD, no TMG, no carotid bruits  Lungs: No use of accessory muscles, no dullness to percussion,distant BS  Cardiovascular: RRR, heart sounds normal, no murmur or gallops, no peripheral edema  Abdomen: soft and NT, no HSM,  BS normal  Musculoskeletal: No deformities, no cyanosis or clubbing  Neuro: alert, non focal  Skin: Warm, no lesions or rashes      Assessment & Plan:   COPD Gold C  Gold stage C. COPD with under dosing of Symbicort at this time Plan Increase Symbicort 2 puff twice daily Maintain Spiriva Note this patient will need Prevnar 13 vaccine in August 2015    Updated Medication List Outpatient Encounter Prescriptions as of 10/10/2013  Medication Sig  . Ascorbic Acid (VITAMIN C) 500 MG tablet Take 500 mg by mouth daily.   Marland Kitchen aspirin 81 MG tablet Take 81 mg by mouth as directed. Twice weekly  . budesonide-formoterol (SYMBICORT) 160-4.5 MCG/ACT inhaler Inhale 1 puff into the lungs 2 (two) times daily.  Marland Kitchen diltiazem (CARDIZEM) 120 MG tablet Take 2 tablets in the morning and 1 in the evening  . fish oil-omega-3 fatty acids 1000 MG capsule Take 1 g by mouth daily.   Marland Kitchen lidocaine (LIDODERM) 5 % Place 1-3 patches onto the skin daily.  . Multiple Vitamin (MULTIVITAMIN) capsule Take 1 capsule by mouth daily.    Marland Kitchen oxyCODONE (OXY IR/ROXICODONE) 5 MG immediate release tablet Take 1-1.5 tablets by mouth 3 (three) times daily as needed.  . pregabalin (LYRICA) 150 MG capsule Take 1 capsule (150 mg total)  by mouth 3 (three) times daily.  Marland Kitchen PROAIR HFA 108 (90 BASE) MCG/ACT inhaler Inhale 2 puffs into the lungs every 6 (six) hours  as needed.   . tiotropium (SPIRIVA HANDIHALER) 18 MCG inhalation capsule inhale the contents of one capsule in the handihaler once daily  . [DISCONTINUED] HYDROcodone-acetaminophen (NORCO/VICODIN) 5-325 MG per tablet Take 1 tablet by mouth 3 (three) times daily.  . [DISCONTINUED] OXcarbazepine (TRILEPTAL) 150 MG tablet Take 1 tablet (150 mg total) by mouth 2 (two) times daily.

## 2013-10-10 NOTE — Patient Instructions (Signed)
Increase symbicort two puff twice daily No other changes We will give Prevnar 13 vaccine 04/2014 Return 4 months

## 2013-11-19 ENCOUNTER — Other Ambulatory Visit: Payer: Self-pay | Admitting: Neurology

## 2013-11-21 NOTE — Telephone Encounter (Signed)
Rx signed and faxed.

## 2013-12-28 ENCOUNTER — Telehealth: Payer: Self-pay | Admitting: Critical Care Medicine

## 2013-12-28 MED ORDER — BUDESONIDE-FORMOTEROL FUMARATE 160-4.5 MCG/ACT IN AERO
2.0000 | INHALATION_SPRAY | Freq: Two times a day (BID) | RESPIRATORY_TRACT | Status: DC
Start: 2013-12-28 — End: 2014-02-01

## 2013-12-28 MED ORDER — ALBUTEROL SULFATE HFA 108 (90 BASE) MCG/ACT IN AERS: 2.0000 | INHALATION_SPRAY | Freq: Four times a day (QID) | RESPIRATORY_TRACT | Status: DC | PRN

## 2013-12-28 NOTE — Telephone Encounter (Signed)
Pt returned call.  Spoke with patient and informed him that his Symbicort directions were changed and refills sent on that medication and Proair.  Pt denied any questions/concerns.  Advised pt to please call should he need anything further.  Will sign off.

## 2013-12-28 NOTE — Telephone Encounter (Signed)
[place] x 1 - Let pt know that rx for Symbicort was changed to 2 puffs bid in our system and sent to pharmacy along with Proair refill

## 2014-02-01 ENCOUNTER — Ambulatory Visit (INDEPENDENT_AMBULATORY_CARE_PROVIDER_SITE_OTHER): Payer: Medicare Other | Admitting: Critical Care Medicine

## 2014-02-01 ENCOUNTER — Encounter: Payer: Self-pay | Admitting: Critical Care Medicine

## 2014-02-01 VITALS — BP 112/60 | HR 72 | Temp 97.8°F | Ht 70.0 in | Wt 181.0 lb

## 2014-02-01 DIAGNOSIS — J439 Emphysema, unspecified: Secondary | ICD-10-CM

## 2014-02-01 DIAGNOSIS — J438 Other emphysema: Secondary | ICD-10-CM

## 2014-02-01 DIAGNOSIS — J449 Chronic obstructive pulmonary disease, unspecified: Secondary | ICD-10-CM

## 2014-02-01 MED ORDER — MOMETASONE FURO-FORMOTEROL FUM 200-5 MCG/ACT IN AERO: 2.0000 | INHALATION_SPRAY | Freq: Two times a day (BID) | RESPIRATORY_TRACT | Status: DC

## 2014-02-01 MED ORDER — TIOTROPIUM BROMIDE MONOHYDRATE 18 MCG IN CAPS: ORAL_CAPSULE | RESPIRATORY_TRACT | Status: DC

## 2014-02-01 NOTE — Patient Instructions (Addendum)
Stop Symbicort Start Dulera two puff twice daily Stay on Spiriva daily No other changes Breathing test will be obtained Return 3 months

## 2014-02-01 NOTE — Progress Notes (Signed)
Subjective:    Patient ID: [name] [name], male    DOB: 15-May-1941, 73 y.o.   MRN: [id number]  HPI  This is a  73 y.o.   WM with COPD Golds Stage C    02/01/2014 Chief Complaint  Patient presents with  . 4 month follow up    Has increased chest tightness, DOE, and hoarseness over the past few months.  No wheezing or cough.  Pt notes more congested and chest tightness for 2 months. No post nasal drip.  No f/c/s.  Cough is dry.  Mild indigestion, not severe. Notes some wheezing . No Qhs dyspnea   CAT Score 02/01/2014 10/10/2013 01/12/2013 10/12/2012  Total CAT Score 13 20 11 15    Review of Systems  Constitutional:   No  weight loss, night sweats,  Fevers, chills, fatigue, lassitude. HEENT:   No headaches,  Difficulty swallowing,  Tooth/dental problems,  Sore throat,                No sneezing, itching, ear ache,+ nasal congestion, +post nasal drip,   CV:  No chest pain,  Orthopnea, PND, swelling in lower extremities, anasarca, dizziness, palpitations  GI  No heartburn, ++indigestion, abdominal pain, nausea, vomiting, diarrhea, change in bowel habits, loss of appetite  Resp: Notes  shortness of breath with exertion not  at rest.  No excess mucus, no productive cough,  Notes  non-productive cough,  No coughing up of blood.  No change in color of mucus.  No wheezing.  No chest wall deformity  Skin: no rash or lesions.  GU: no dysuria, change in color of urine, no urgency or frequency.  No flank pain.  MS:  No joint pain or swelling.  No decreased range of motion.  No back pain.  Psych:  No change in mood or affect. No depression or anxiety.  No memory loss.     Objective:   Physical Exam  Filed Vitals:   02/01/14 1042  BP: 112/60  Pulse: 72  Temp: 97.8 F (36.6 C)  TempSrc: Oral  Height: 5\' 10"  (1.778 m)  Weight: 181 lb (82.101 kg)  SpO2: 94%    Gen: Pleasant, well-nourished, in no distress,  normal affect  ENT: No lesions,  mouth clear,  oropharynx clear, no postnasal  drip  Neck: No JVD, no TMG, no carotid bruits  Lungs: No use of accessory muscles, no dullness to percussion,distant BS  Cardiovascular: RRR, heart sounds normal, no murmur or gallops, no peripheral edema  Abdomen: soft and NT, no HSM,  BS normal  Musculoskeletal: No deformities, no cyanosis or clubbing  Neuro: alert, non focal  Skin: Warm, no lesions or rashes      Assessment & Plan:   COPD Gold C  Gold stage C. COPD with ongoing airway obstruction Plan Stop Symbicort Start Dulera two puff twice daily 200 strength Stay on Spiriva daily No other changes Repeat pulmonary function studies  Return 3 months     Updated Medication List Outpatient Encounter Prescriptions as of 02/01/2014  Medication Sig  . albuterol (PROAIR HFA) 108 (90 BASE) MCG/ACT inhaler Inhale 2 puffs into the lungs every 6 (six) hours as needed.  . Ascorbic Acid (VITAMIN C) 500 MG tablet Take 500 mg by mouth daily.   . cyclobenzaprine (FLEXERIL) 5 MG tablet as needed.  . diltiazem (CARDIZEM) 120 MG tablet Take 2 tablets in the morning and 1 in the evening  . fish oil-omega-3 fatty acids 1000 MG capsule Take 1 g by  mouth daily.   Marland Kitchen LYRICA 150 MG capsule TAKE ONE CAPSULE BY MOUTH THREE TIMES DAILY  . Multiple Vitamin (MULTIVITAMIN) capsule Take 1 capsule by mouth daily.    . Oxycodone HCl 10 MG TABS Take 1 mg by mouth every 6 (six) hours as needed.  . tiotropium (SPIRIVA HANDIHALER) 18 MCG inhalation capsule inhale the contents of one capsule in the handihaler once daily  . [DISCONTINUED] budesonide-formoterol (SYMBICORT) 160-4.5 MCG/ACT inhaler Inhale 2 puffs into the lungs 2 (two) times daily.  . [DISCONTINUED] tiotropium (SPIRIVA HANDIHALER) 18 MCG inhalation capsule inhale the contents of one capsule in the handihaler once daily  . aspirin 81 MG tablet On hold for dental procedure  . mometasone-formoterol (DULERA) 200-5 MCG/ACT AERO Inhale 2 puffs into the lungs 2 (two) times daily.  . [DISCONTINUED]  lidocaine (LIDODERM) 5 % Place 1-3 patches onto the skin daily.  . [DISCONTINUED] oxyCODONE (OXY IR/ROXICODONE) 5 MG immediate release tablet Take 1-1.5 tablets by mouth 3 (three) times daily as needed.

## 2014-02-02 NOTE — Assessment & Plan Note (Signed)
Gold stage C. COPD with ongoing airway obstruction Plan Stop Symbicort Start Dulera two puff twice daily 200 strength Stay on Spiriva daily No other changes Repeat pulmonary function studies  Return 3 months

## 2014-02-03 ENCOUNTER — Telehealth: Payer: Self-pay | Admitting: *Deleted

## 2014-02-03 NOTE — Telephone Encounter (Signed)
Fax received, filled out and faxed back to [phone] ID# [id number] Phone # [phone] Will forward to Crystal to follow up on Please advise thanks

## 2014-02-08 MED ORDER — FLUTICASONE FUROATE-VILANTEROL 100-25 MCG/INH IN AEPB: INHALATION_SPRAY | RESPIRATORY_TRACT | Status: DC

## 2014-02-08 NOTE — Telephone Encounter (Signed)
Called the pharmacy and they did run the breo through but this will cost  the pt 124.62.  The pharmacy will keep this medication on hold until the pt calls to have it filled.  i called and spoke with pt and he is aware of the cost of the medication and he will call and get the pharmacy to fill the breo and try it to see how it works for him.  Med list has been update.  Will forward this back to PW to make him aware that the breo has been sent in for the pt.

## 2014-02-08 NOTE — Telephone Encounter (Signed)
See if they will cover breo one puff daily

## 2014-02-08 NOTE — Telephone Encounter (Signed)
We received a DENIAL for dulera on this pt.  Per fax, "Ruthe Mannan was denied bc the use is not supported by the FDA or by one of the Medicare approved references for treating your medical condition: COPD." Dr. Joya Gaskins, pls advise if [facility] can be changed or if you would like to proceed with Appeal.  Thank you.

## 2014-02-09 MED ORDER — FLUTICASONE FUROATE-VILANTEROL 100-25 MCG/INH IN AEPB: INHALATION_SPRAY | RESPIRATORY_TRACT | Status: DC

## 2014-02-09 NOTE — Telephone Encounter (Signed)
Ok, another option before he buys med is to give pt samples to try first of the breo

## 2014-02-09 NOTE — Telephone Encounter (Signed)
[facility] for pt.  Need to offer samples of Breo if he has not already gotten rx from pharmacy.

## 2014-02-09 NOTE — Addendum Note (Signed)
Addended by: Doroteo Glassman D on: 02/09/2014 11:15 AM   Modules accepted: Orders

## 2014-02-09 NOTE — Telephone Encounter (Signed)
Spoke with pt.  Advised that samples of Breo were left at front desk for pick up.

## 2014-03-03 ENCOUNTER — Ambulatory Visit (INDEPENDENT_AMBULATORY_CARE_PROVIDER_SITE_OTHER): Payer: Medicare Other | Admitting: Critical Care Medicine

## 2014-03-03 ENCOUNTER — Telehealth: Payer: Self-pay | Admitting: Critical Care Medicine

## 2014-03-03 DIAGNOSIS — J439 Emphysema, unspecified: Secondary | ICD-10-CM

## 2014-03-03 DIAGNOSIS — J438 Other emphysema: Secondary | ICD-10-CM

## 2014-03-03 NOTE — Progress Notes (Signed)
PFT done. Jennifer Castillo, CMA  

## 2014-03-03 NOTE — Progress Notes (Signed)
Quick Note:  Pt aware per 6.19.15 phone note. ______

## 2014-03-03 NOTE — Progress Notes (Signed)
Quick Note:  lmomtcb for pt ______ 

## 2014-03-03 NOTE — Telephone Encounter (Signed)
Result Notes    Notes Recorded by Jonelle Sports, RN on 03/03/2014 at 3:03 PM lmomtcb for pt ------  Notes Recorded by Elsie Stain, MD on 03/03/2014 at 1:30 PM Let pt know his PFTs are stable, do show chronic lung disease due to copd No med changes   Called spoke with patient and discussed his PFT results as stated by PW above.  Pt verbalized his understanding and denied any questions at this time.  Pt did mention that he has been on the Breo x14days now (see 5/22 phone note) and does not feel that this is controlling his symptoms as well as the Symbicort.  However, pt did also stated that he has 1 more sample and would like to try this as well and will call the office before completion with an update.  Nothing further needed at this time; will sign off.

## 2014-03-27 ENCOUNTER — Other Ambulatory Visit: Payer: Self-pay | Admitting: Urology

## 2014-03-27 ENCOUNTER — Encounter (HOSPITAL_BASED_OUTPATIENT_CLINIC_OR_DEPARTMENT_OTHER): Payer: Self-pay | Admitting: *Deleted

## 2014-03-28 ENCOUNTER — Encounter (HOSPITAL_BASED_OUTPATIENT_CLINIC_OR_DEPARTMENT_OTHER): Payer: Self-pay | Admitting: *Deleted

## 2014-03-30 ENCOUNTER — Encounter (HOSPITAL_BASED_OUTPATIENT_CLINIC_OR_DEPARTMENT_OTHER): Payer: Self-pay | Admitting: *Deleted

## 2014-03-30 NOTE — Progress Notes (Signed)
NPO AFTER MN. ARRIVE AT 0700. NEEDS ISTAT. CURRENT EKG IN CHART AND EPIC. WILL TAKE AM MEDS AND DO INHALER W/ SIPS OF WATER.

## 2014-03-31 NOTE — H&P (Signed)
Reason For Visit          Mr. Cunliffe returns for routine annual followup. He is currently 73 years of age. His primary issue was prostate cancer and he is now over 7 years out from seed implantation. 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 His last dilation procedure occurred in December 2012.    PSA data has been extremely encouraging. His most recent PSA was checked in June 2015 and essentially was completely undetectable. He has had continued slowing of his urinary stream. More hesitancy and weaker stream. PVR today is 66ml. Urine clear.   History of Present Illness       Past Gu Hx:      The patient's main issue is a history of adenocarcinoma of the prostate. 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 The patient initial underwent active surveilance, but his PSA increased and he elected definitive management. The patient underwent seed implantation in March 2008. D90 was around 101%. At the time of his seed implantation the patient was noted to have a substantial bladder neck contracture. He had previously had laser surgery of his prostate. The patient ended up having a dilation, but then developed substantial voiding symptoms and required a more formal dilation and Holmium laser incision of his bladder neck. The patient's stream has slowed down, but for the most part he has been satisfied with voiding with minimal nocturia, and he really is without substantial complaints. The patient's PSA has shown a fair amount of lability. The patient's PSA went up as high as 2.98 in November 2009. His prior Nadir level of PSA was 0.9 in March 2009. The PSA was subsequently rechecked in early February 2010 and was back down to 0.9 which was encouraging.         PSA in August in 2011 was down to 0.09, which is extremely encouraging. Penile injections continue to work well for him. PSA in  08/2010 was up slightly to 0.23.    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   Past Medical History Problems  1. History of Back Pain 2. Personal history of prostate cancer (V10.46)  Surgical History Problems  1. History of Cystoscopy For Urethral Stricture 2. History of Prostate Surgery 3. History of Transurethral Incision Of Prostate 4. History of Transurethral Resection Of Bladder Neck  Current Meds 1. Aspirin 81 MG Oral Tablet; 1 per day;  Therapy: (Recorded:12Jun2012) to Recorded 2. Diltiazem HCl ER Coated Beads 120 MG Oral Capsule Extended Release 24 Hour;  120mg ; 2 capsules in the morning; 120mg  capsule in the evening;  Therapy: [id number] to Recorded 3. Lyrica 50 MG Oral Capsule; 1 capsule twice daily;  Therapy: (Recorded:12Jun2012) to Recorded 4. Multi-Vitamin TABS; 1 per day;  Therapy: (Recorded:12Jun2012) to Recorded 5. OXcarbazepine 150 MG Oral Tablet;  Therapy: [id number] to Recorded  Allergies Medication  1. No Known Drug Allergies  Family History Problems  1. Family history of Family Health Status Number Of Children   3 sons3 daughters  Social History Problems  1. Denied: History of Alcohol Use 2. Caffeine Use   4 per day 3. Family history of Death In The Family Father   age 51-pneumonia 36. Family history of Death In The Family Mother   age 59 5. Marital History - Currently Married 6. History of Tobacco Use (V15.82)   smoked 2 packs per day for  74 yearsquit for 8 years  Review of Systems Genitourinary, constitutional, skin, eye, otolaryngeal, hematologic/lymphatic, cardiovascular, pulmonary, endocrine, musculoskeletal, gastrointestinal, neurological and psychiatric system(s) were reviewed and pertinent findings if present are noted.  Genitourinary: difficulty starting the urinary stream, weak urinary stream, urinary stream starts and stops, incomplete emptying of bladder, erectile dysfunction  and initiating urination requires straining, but no urinary frequency, no feelings of urinary urgency, no dysuria, no nocturia and no hematuria.  Gastrointestinal: no nausea, no vomiting, no flank pain, no abdominal pain and no constipation.  Constitutional: no fever and not feeling tired (fatigue).  Hematologic/Lymphatic: a tendency to easily bruise.  Respiratory: shortness of breath and shortness of breath during exertion, but no cough.  Musculoskeletal: back pain, joint pain and limb pain, but no bone pain.    Vitals Vital Signs [Data Includes: Last 1 Day]  Recorded: 25Jun2015 12:14PM  Height: 5 ft 9 in Weight: 180 lb  BMI Calculated: 26.58 BSA Calculated: 1.98 Blood Pressure: 119 / 64 Temperature: 97.5 F Heart Rate: 82  Well-developed well-nourished male in no acute distress Respiratory: Normal effort Cardiac: Regular rate and rhythm Abdomen: Soft nontender no palpable masses Genitourinary: Normal external genitalia Extremities: No tenderness edema Neurologic: Nonfocal  Results/Data  PVR: Ultrasound PVR 91 ml.    Assessment Assessed  1. Prostate cancer (185) 2. 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 PSA; Status:Hold For - Specimen/Data Collection,Appointment; Requested  BWI:[id number];  2. Follow-up Year x 1 Office  Follow-up  Status: Hold For - Appointment  Requested for:  [phone]  Discussion/Summary   Mr Litsey situation is extremely encouraging from a cancer standpoint and his PSA has gone all the way to completely undetectable levels. The biggest issue is some progressive worsening of his overall voiding. He does have known significant bladder neck contracture. He is having increased hesitancy with a weaker stream and more difficulty emptying completely. Postvoid residual is elevated but certainly not at a dangerous level and his urine is clear today. At this point, it is really Mr Reiber' decision as to whether he wants to undergo another  balloon dilation of his bladder neck contracture. He understands that it is likely that it will help his urination but also very likely that it will not be permanent. He has had some progression in his COPD and may require supplemental oxygen in the future. This procedure could be done under IV sedation or even a spinal. He will let me know if he wants to proceed with a repeat dilation. Otherwise, given how well he is doing from a cancer standpoint, we can put him down for once a year followup but again sooner if the voiding worsens.

## 2014-04-03 ENCOUNTER — Encounter (HOSPITAL_BASED_OUTPATIENT_CLINIC_OR_DEPARTMENT_OTHER): Payer: Self-pay | Admitting: *Deleted

## 2014-04-03 ENCOUNTER — Encounter (HOSPITAL_BASED_OUTPATIENT_CLINIC_OR_DEPARTMENT_OTHER): Payer: Medicare Other | Admitting: Anesthesiology

## 2014-04-03 ENCOUNTER — Ambulatory Visit (HOSPITAL_BASED_OUTPATIENT_CLINIC_OR_DEPARTMENT_OTHER): Payer: Medicare Other | Admitting: Anesthesiology

## 2014-04-03 ENCOUNTER — Ambulatory Visit (HOSPITAL_BASED_OUTPATIENT_CLINIC_OR_DEPARTMENT_OTHER)
Admission: RE | Admit: 2014-04-03 | Discharge: 2014-04-03 | Disposition: A | Discharge status: Home / Self Care | Payer: Medicare Other | Source: Ambulatory Visit | Attending: Urology | Admitting: Urology

## 2014-04-03 ENCOUNTER — Encounter (HOSPITAL_BASED_OUTPATIENT_CLINIC_OR_DEPARTMENT_OTHER)
Admission: RE | Disposition: A | Discharge status: Home / Self Care | Payer: Self-pay | Source: Ambulatory Visit | Attending: Urology

## 2014-04-03 DIAGNOSIS — Z79899 Other long term (current) drug therapy: Secondary | ICD-10-CM | POA: Insufficient documentation

## 2014-04-03 DIAGNOSIS — Z7982 Long term (current) use of aspirin: Secondary | ICD-10-CM | POA: Insufficient documentation

## 2014-04-03 DIAGNOSIS — Z87891 Personal history of nicotine dependence: Secondary | ICD-10-CM | POA: Insufficient documentation

## 2014-04-03 DIAGNOSIS — J4489 Other specified chronic obstructive pulmonary disease: Secondary | ICD-10-CM | POA: Insufficient documentation

## 2014-04-03 DIAGNOSIS — R39198 Other difficulties with micturition: Secondary | ICD-10-CM | POA: Diagnosis not present

## 2014-04-03 DIAGNOSIS — R3911 Hesitancy of micturition: Secondary | ICD-10-CM | POA: Diagnosis not present

## 2014-04-03 DIAGNOSIS — J449 Chronic obstructive pulmonary disease, unspecified: Secondary | ICD-10-CM | POA: Insufficient documentation

## 2014-04-03 DIAGNOSIS — I4891 Unspecified atrial fibrillation: Secondary | ICD-10-CM | POA: Diagnosis not present

## 2014-04-03 DIAGNOSIS — N320 Bladder-neck obstruction: Secondary | ICD-10-CM | POA: Diagnosis present

## 2014-04-03 DIAGNOSIS — C61 Malignant neoplasm of prostate: Secondary | ICD-10-CM | POA: Insufficient documentation

## 2014-04-03 HISTORY — DX: Presence of dental prosthetic device (complete) (partial): Z97.2

## 2014-04-03 HISTORY — DX: Paroxysmal atrial fibrillation: I48.0

## 2014-04-03 HISTORY — DX: Polyneuropathy, unspecified: G62.9

## 2014-04-03 HISTORY — PX: CYSTOSCOPY WITH URETHRAL DILATATION: SHX5125

## 2014-04-03 HISTORY — DX: Emphysema, unspecified: J43.9

## 2014-04-03 HISTORY — DX: Personal history of other infectious and parasitic diseases: Z86.19

## 2014-04-03 HISTORY — DX: Unspecified urethral stricture, male, unspecified site: N35.919

## 2014-04-03 HISTORY — DX: Retention of urine, unspecified: R33.9

## 2014-04-03 HISTORY — DX: Personal history of malignant neoplasm of prostate: Z85.46

## 2014-04-03 HISTORY — DX: Sciatica, unspecified side: M54.30

## 2014-04-03 LAB — POCT I-STAT 4, (NA,K, GLUC, HGB,HCT)
GLUCOSE: 92 mg/dL (ref 70–99)
HEMATOCRIT: 44 % (ref 39.0–52.0)
Hemoglobin: 15 g/dL (ref 13.0–17.0)
Potassium: 4.3 mEq/L (ref 3.7–5.3)
SODIUM: 141 meq/L (ref 137–147)

## 2014-04-03 SURGERY — CYSTOSCOPY, WITH URETHRAL DILATION
Anesthesia: General | Site: Bladder

## 2014-04-03 MED ORDER — LACTATED RINGERS IV SOLN
INTRAVENOUS | Status: DC
  Administered 2014-04-03 (×2): via INTRAVENOUS
  Filled 2014-04-03: qty 1000

## 2014-04-03 MED ORDER — ONDANSETRON HCL 4 MG/2ML IJ SOLN
INTRAMUSCULAR | Status: DC | PRN
  Administered 2014-04-03: 4 mg via INTRAVENOUS

## 2014-04-03 MED ORDER — LIDOCAINE HCL 2 % EX GEL
CUTANEOUS | Status: DC | PRN
  Administered 2014-04-03: 1 via URETHRAL

## 2014-04-03 MED ORDER — MIDAZOLAM HCL 2 MG/2ML IJ SOLN
INTRAMUSCULAR | Status: AC
  Filled 2014-04-03: qty 2

## 2014-04-03 MED ORDER — CEFAZOLIN SODIUM-DEXTROSE 2-3 GM-% IV SOLR
INTRAVENOUS | Status: AC
  Filled 2014-04-03: qty 50

## 2014-04-03 MED ORDER — EPHEDRINE SULFATE 50 MG/ML IJ SOLN
INTRAMUSCULAR | Status: DC | PRN
  Administered 2014-04-03: 10 mg via INTRAVENOUS

## 2014-04-03 MED ORDER — LIDOCAINE HCL (CARDIAC) 20 MG/ML IV SOLN
INTRAVENOUS | Status: DC | PRN
  Administered 2014-04-03: 60 mg via INTRAVENOUS

## 2014-04-03 MED ORDER — PROPOFOL 10 MG/ML IV BOLUS
INTRAVENOUS | Status: DC | PRN
  Administered 2014-04-03: 160 mg via INTRAVENOUS

## 2014-04-03 MED ORDER — STERILE WATER FOR IRRIGATION IR SOLN
Status: DC | PRN
  Administered 2014-04-03: 3000 mL

## 2014-04-03 MED ORDER — BELLADONNA ALKALOIDS-OPIUM 16.2-60 MG RE SUPP
RECTAL | Status: AC
  Filled 2014-04-03: qty 1

## 2014-04-03 MED ORDER — TAMSULOSIN HCL 0.4 MG PO CAPS: 0.4000 mg | ORAL_CAPSULE | Freq: Every day | ORAL | Status: DC

## 2014-04-03 MED ORDER — CEFAZOLIN SODIUM-DEXTROSE 2-3 GM-% IV SOLR
2.0000 g | INTRAVENOUS | Status: AC
  Administered 2014-04-03: 2 g via INTRAVENOUS
  Filled 2014-04-03: qty 50

## 2014-04-03 MED ORDER — PROMETHAZINE HCL 25 MG/ML IJ SOLN
6.2500 mg | INTRAMUSCULAR | Status: DC | PRN
  Filled 2014-04-03: qty 1

## 2014-04-03 MED ORDER — FENTANYL CITRATE 0.05 MG/ML IJ SOLN
INTRAMUSCULAR | Status: AC
  Filled 2014-04-03: qty 6

## 2014-04-03 MED ORDER — FENTANYL CITRATE 0.05 MG/ML IJ SOLN
INTRAMUSCULAR | Status: DC | PRN
  Administered 2014-04-03: 25 ug via INTRAVENOUS
  Administered 2014-04-03: 75 ug via INTRAVENOUS

## 2014-04-03 MED ORDER — DEXAMETHASONE SODIUM PHOSPHATE 4 MG/ML IJ SOLN
INTRAMUSCULAR | Status: DC | PRN
  Administered 2014-04-03: 8 mg via INTRAVENOUS

## 2014-04-03 MED ORDER — FENTANYL CITRATE 0.05 MG/ML IJ SOLN
25.0000 ug | INTRAMUSCULAR | Status: DC | PRN
  Filled 2014-04-03: qty 1

## 2014-04-03 MED ORDER — KETOROLAC TROMETHAMINE 30 MG/ML IJ SOLN
15.0000 mg | Freq: Once | INTRAMUSCULAR | Status: DC | PRN
  Filled 2014-04-03: qty 1

## 2014-04-03 SURGICAL SUPPLY — 12 items
BAG DRAIN URO-CYSTO SKYTR STRL (DRAIN) ×3 IMPLANT
BAG DRN UROCATH (DRAIN) ×1
CANISTER SUCT LVC 12 LTR MEDI- (MISCELLANEOUS) ×2 IMPLANT
CLOTH BEACON ORANGE TIMEOUT ST (SAFETY) ×3 IMPLANT
DRAPE CAMERA CLOSED 9X96 (DRAPES) ×3 IMPLANT
GLOVE BIO SURGEON STRL SZ7.5 (GLOVE) ×3 IMPLANT
GLOVE BIOGEL PI IND STRL 7.5 (GLOVE) IMPLANT
GLOVE BIOGEL PI INDICATOR 7.5 (GLOVE) ×4
GOWN STRL REUS W/ TWL XL LVL3 (GOWN DISPOSABLE) IMPLANT
GOWN STRL REUS W/TWL XL LVL3 (GOWN DISPOSABLE) ×5 IMPLANT
PACK CYSTOSCOPY (CUSTOM PROCEDURE TRAY) ×3 IMPLANT
WATER STERILE IRR 3000ML UROMA (IV SOLUTION) ×3 IMPLANT

## 2014-04-03 NOTE — Transfer of Care (Signed)
Immediate Anesthesia Transfer of Care Note  Patient: [name] [name]  Procedure(s) Performed: Procedure(s) (LRB): CYSTOSCOPY WITH dilatation (N/A)  Patient Location: PACU  Anesthesia Type: General  Level of Consciousness: awake, oriented, sedated and patient cooperative  Airway & Oxygen Therapy: Patient Spontanous Breathing and Patient connected to face mask oxygen  Post-op Assessment: Report given to PACU RN and Post -op Vital signs reviewed and stable  Post vital signs: Reviewed and stable  Complications: No apparent anesthesia complications

## 2014-04-03 NOTE — Op Note (Signed)
Preoperative diagnosis: Recurrent bladder contracture, history of prostate cancer status post seed implantation Postoperative diagnosis: Same  Procedure: Cystoscopy with urethral dilation/calibration  Surgeon: Bernestine Amass M.D.  Anesthesia: Gen.  Indications: [name] [name] is currently 73 years of age. In the distant past he had undergone a "laser prostatectomy" at outside facility. He was ultimately diagnosed with prostate cancer and underwent seed implantation in 2008. At the time of seed implantation he was noted to have a substantial bladder neck contracture. The patient has previously had a formal bladder neck dilation and laser incision of his bladder neck. His most recent procedure was performed in December 2012. He recently has complained of marked worsening of his urinary stream with increased hesitancy. Postvoid residual recently was 90 mL with clear urine. He presents now for endoscopic reassessment and repeat dilation of his bladder contracture.     Technique and findings: Patient was brought to the operating room where he successful induction of general anesthesia. He was placed in lithotomy position and prepped and draped in usual manner. He received perioperative antibiotics. Appropriate surgical timeout was performed. Cystoscopy revealed an unremarkable anterior urethra. Prostatic urethra measured approximately 3 cm and there was no visual obstruction. I was surprised is seen at the bladder neck was actually open and easily accepted the rigid cystoscope. There was no evidence of significant anatomic recurrent bladder neck contracture. The bladder was carefully panendoscoped and no abnormalities were appreciated. I removed the cystoscope and did some calibration. I was able to get a 30 [employer] sound into that area without significant tightness. I did not feel that a more formal incision of the bladder not was necessary. I suspect his increased urinary hesitancy and weak stream may very  secondary to bladder dysfunction and hypocontractility. Patient's bladder was emptied and he was brought to recovery room in stable condition having had no obvious complications or problems.

## 2014-04-03 NOTE — Anesthesia Procedure Notes (Signed)
Procedure Name: LMA Insertion Date/Time: 04/03/2014 9:31 AM Performed by: Denna Haggard D Pre-anesthesia Checklist: Patient identified, Emergency Drugs available, Suction available and Patient being monitored Patient Re-evaluated:Patient Re-evaluated prior to inductionOxygen Delivery Method: Circle System Utilized Preoxygenation: Pre-oxygenation with 100% oxygen Intubation Type: IV induction Ventilation: Mask ventilation without difficulty LMA: LMA inserted LMA Size: 5.0 Number of attempts: 1 Airway Equipment and Method: bite block Placement Confirmation: positive ETCO2 Tube secured with: Tape Dental Injury: Teeth and Oropharynx as per pre-operative assessment

## 2014-04-03 NOTE — Interval H&P Note (Signed)
History and Physical Interval Note:  04/03/2014 8:38 AM  [name] [name]  has presented today for surgery, with the diagnosis of URETHRAL STRICTURE  The various methods of treatment have been discussed with the patient and family. After consideration of risks, benefits and other options for treatment, the patient has consented to  Procedure(s): CYSTOSCOPY WITH BALLOON  DILATATION OF URETHRAL STRICTURE (N/A) as a surgical intervention .  The patient's history has been reviewed, patient examined, no change in status, stable for surgery.  I have reviewed the patient's chart and labs.  Questions were answered to the patient's satisfaction.     Lawson Isabell S

## 2014-04-03 NOTE — Anesthesia Preprocedure Evaluation (Signed)
Anesthesia Evaluation  Patient identified by MRN, date of birth, ID band Patient awake    Reviewed: Allergy & Precautions, H&P , NPO status , Patient's Chart, lab work & pertinent test results  Airway Mallampati: II TM Distance: >3 FB Neck ROM: Full    Dental no notable dental hx.    Pulmonary COPD COPD inhaler, former smoker,  breath sounds clear to auscultation  Pulmonary exam normal       Cardiovascular hypertension, + dysrhythmias Atrial Fibrillation Rhythm:Regular Rate:Normal     Neuro/Psych negative neurological ROS  negative psych ROS   GI/Hepatic negative GI ROS, Neg liver ROS,   Endo/Other  negative endocrine ROS  Renal/GU negative Renal ROS  negative genitourinary   Musculoskeletal negative musculoskeletal ROS (+)   Abdominal   Peds negative pediatric ROS (+)  Hematology negative hematology ROS (+)   Anesthesia Other Findings   Reproductive/Obstetrics negative OB ROS                           Anesthesia Physical Anesthesia Plan  ASA: III  Anesthesia Plan: General   Post-op Pain Management:    Induction: Intravenous  Airway Management Planned: LMA  Additional Equipment:   Intra-op Plan:   Post-operative Plan: Extubation in OR  Informed Consent: I have reviewed the patients History and Physical, chart, labs and discussed the procedure including the risks, benefits and alternatives for the proposed anesthesia with the patient or authorized representative who has indicated his/her understanding and acceptance.   Dental advisory given  Plan Discussed with: CRNA and Surgeon  Anesthesia Plan Comments:         Anesthesia Quick Evaluation

## 2014-04-03 NOTE — Anesthesia Postprocedure Evaluation (Signed)
  Anesthesia Post-op Note  Patient: [name] [name]  Procedure(s) Performed: Procedure(s) (LRB): CYSTOSCOPY WITH dilatation (N/A)  Patient Location: PACU  Anesthesia Type: General  Level of Consciousness: awake and alert   Airway and Oxygen Therapy: Patient Spontanous Breathing  Post-op Pain: mild  Post-op Assessment: Post-op Vital signs reviewed, Patient's Cardiovascular Status Stable, Respiratory Function Stable, Patent Airway and No signs of Nausea or vomiting  Last Vitals:  Filed Vitals:   04/03/14 1000  BP: 99/53  Pulse: 71  Temp:   Resp: 22    Post-op Vital Signs: stable   Complications: No apparent anesthesia complications

## 2014-04-03 NOTE — Discharge Instructions (Addendum)
Cystoscopy patient instructions  Following a cystoscopy, a catheter (a flexible rubber tube) is sometimes left in place to empty the bladder. This may cause some discomfort or a feeling that you need to urinate. Your doctor determines the period of time that the catheter will be left in place. You may have bloody urine for two to three days (Call your doctor if the amount of bleeding increases or does not subside).  You may pass blood clots in your urine, especially if you had a biopsy. It is not unusual to pass small blood clots and have some bloody urine a couple of weeks after your cystoscopy. Again, call your doctor if the bleeding does not subside. You may have: Dysuria (painful urination) Frequency (urinating often) Urgency (strong desire to urinate)  These symptoms are common especially if medicine is instilled into the bladder or a ureteral stent is placed. Avoiding alcohol and caffeine, such as coffee, tea, and chocolate, may help relieve these symptoms. Drink plenty of water, unless otherwise instructed. Your doctor may also prescribe an antibiotic or other medicine to reduce these symptoms.  Cystoscopy results are available soon after the procedure; biopsy results usually take two to four days. Your doctor will discuss the results of your exam with you. Before you go home, you will be given specific instructions for follow-up care. Special Instructions:  1 If you are going home with a catheter in place do not take a tub bath until removed by your doctor.  2 You may resume your normal activities.  3 Do not drive or operate machinery if you are taking narcotic pain medicine.  4 Be sure to keep all follow-up appointments with your doctor.   5 Call Your Doctor If: The catheter is not draining  You have severe pain  You are unable to urinate  You have a fever over 101  You have severe bleeding           I am going to start you on tamsulosin to see if that helps your  voiding Post Anesthesia Home Care Instructions  Activity: Get plenty of rest for the remainder of the day. A responsible adult should stay with you for 24 hours following the procedure.  For the next 24 hours, DO NOT: -Drive a car -Paediatric nurse -Drink alcoholic beverages -Take any medication unless instructed by your physician -Make any legal decisions or sign important papers.  Meals: Start with liquid foods such as gelatin or soup. Progress to regular foods as tolerated. Avoid greasy, spicy, heavy foods. If nausea and/or vomiting occur, drink only clear liquids until the nausea and/or vomiting subsides. Call your physician if vomiting continues.  Special Instructions/Symptoms: Your throat may feel dry or sore from the anesthesia or the breathing tube placed in your throat during surgery. If this causes discomfort, gargle with warm salt water. The discomfort should disappear within 24 hours.

## 2014-04-04 ENCOUNTER — Encounter (HOSPITAL_BASED_OUTPATIENT_CLINIC_OR_DEPARTMENT_OTHER): Payer: Self-pay | Admitting: Urology

## 2014-04-05 LAB — PULMONARY FUNCTION TEST
DL/VA % pred: 67 %
DL/VA: 3.03 ml/min/mmHg/L
DLCO UNC % PRED: 59 %
DLCO UNC: 17.56 ml/min/mmHg
FEF 25-75 PRE: 0.44 L/s
FEF 25-75 Post: 0.48 L/sec
FEF2575-%CHANGE-POST: 10 %
FEF2575-%Pred-Post: 22 %
FEF2575-%Pred-Pre: 20 %
FEV1-%CHANGE-POST: 8 %
FEV1-%Pred-Post: 46 %
FEV1-%Pred-Pre: 42 %
FEV1-Post: 1.35 L
FEV1-Pre: 1.25 L
FEV1FVC-%Change-Post: 5 %
FEV1FVC-%Pred-Pre: 69 %
FEV6-%Change-Post: 1 %
FEV6-%PRED-PRE: 61 %
FEV6-%Pred-Post: 62 %
FEV6-POST: 2.36 L
FEV6-Pre: 2.32 L
FEV6FVC-%CHANGE-POST: 0 %
FEV6FVC-%Pred-Post: 100 %
FEV6FVC-%Pred-Pre: 99 %
FVC-%Change-Post: 2 %
FVC-%PRED-POST: 63 %
FVC-%PRED-PRE: 61 %
FVC-Post: 2.54 L
FVC-Pre: 2.47 L
POST FEV6/FVC RATIO: 95 %
Post FEV1/FVC ratio: 53 %
Pre FEV1/FVC ratio: 50 %
Pre FEV6/FVC Ratio: 94 %
RV % pred: 167 %
RV: 4.03 L
TLC % PRED: 108 %
TLC: 7.22 L

## 2014-04-25 ENCOUNTER — Ambulatory Visit (HOSPITAL_COMMUNITY)
Admission: RE | Admit: 2014-04-25 | Discharge: 2014-04-25 | Disposition: A | Discharge status: Home / Self Care | Payer: Medicare Other | Source: Ambulatory Visit | Attending: Family Medicine | Admitting: Family Medicine

## 2014-04-25 DIAGNOSIS — Z0389 Encounter for observation for other suspected diseases and conditions ruled out: Secondary | ICD-10-CM | POA: Diagnosis present

## 2014-04-25 LAB — BLOOD GAS, ARTERIAL
Acid-base deficit: 0.9 mmol/L (ref 0.0–2.0)
Bicarbonate: 23 mEq/L (ref 20.0–24.0)
DRAWN BY: 23534
FIO2: 0.21 %
O2 Content: 21 L/min
O2 Saturation: 94.1 %
PCO2 ART: 36 mmHg (ref 35.0–45.0)
PO2 ART: 68.7 mmHg — AB (ref 80.0–100.0)
Patient temperature: 37
TCO2: 19.7 mmol/L (ref 0–100)
pH, Arterial: 7.42 (ref 7.350–7.450)

## 2014-05-15 ENCOUNTER — Ambulatory Visit (INDEPENDENT_AMBULATORY_CARE_PROVIDER_SITE_OTHER): Payer: Medicare Other | Admitting: Critical Care Medicine

## 2014-05-15 ENCOUNTER — Encounter: Payer: Self-pay | Admitting: Critical Care Medicine

## 2014-05-15 VITALS — BP 120/68 | HR 89 | Temp 96.8°F | Ht 70.0 in | Wt 172.4 lb

## 2014-05-15 DIAGNOSIS — Z23 Encounter for immunization: Secondary | ICD-10-CM

## 2014-05-15 DIAGNOSIS — J438 Other emphysema: Secondary | ICD-10-CM

## 2014-05-15 DIAGNOSIS — J4489 Other specified chronic obstructive pulmonary disease: Secondary | ICD-10-CM

## 2014-05-15 DIAGNOSIS — J432 Centrilobular emphysema: Secondary | ICD-10-CM

## 2014-05-15 DIAGNOSIS — J449 Chronic obstructive pulmonary disease, unspecified: Secondary | ICD-10-CM

## 2014-05-15 MED ORDER — TIOTROPIUM BROMIDE MONOHYDRATE 2.5 MCG/ACT IN AERS: 2.0000 | INHALATION_SPRAY | Freq: Every day | RESPIRATORY_TRACT | Status: DC

## 2014-05-15 MED ORDER — FLUTICASONE FUROATE-VILANTEROL 100-25 MCG/INH IN AEPB: 1.0000 | INHALATION_SPRAY | Freq: Every morning | RESPIRATORY_TRACT | Status: DC

## 2014-05-15 NOTE — Progress Notes (Signed)
Subjective:    Patient ID: [name] [name], male    DOB: [birth year], 73 y.o.   MRN: [id number]  HPI  This is a  73 y.o.   WM with COPD Golds Stage C    05/15/2014 Chief Complaint  Patient presents with  . 3 month follow up    Breathing has worsened over the past few days - increased chest tightness and congestion, nonprod cough, and increased SOB.   Pt feels is worse with dyspnea.  Notes more chest tightness, not able to get up much mucus. No real mucus. No fever/chills.  Notes DOE mainly, with min activity.  Notes some ankle edema Pt was on symbicort and spiriva but does not have as good a response on breo  Recent Abg: pH 7.42 pco2 36 pO2 68   CAT Score 05/15/2014 02/01/2014 10/10/2013 01/12/2013  Total CAT Score 27 13 20 11    Review of Systems  Constitutional:   No  weight loss, night sweats,  Fevers, chills, fatigue, lassitude. HEENT:   No headaches,  Difficulty swallowing,  Tooth/dental problems,  Sore throat,                No sneezing, itching, ear ache,+ nasal congestion, +post nasal drip,   CV:  No chest pain,  Orthopnea, PND, swelling in lower extremities, anasarca, dizziness, palpitations  GI  No heartburn, ++indigestion, abdominal pain, nausea, vomiting, diarrhea, change in bowel habits, loss of appetite  Resp: Notes  shortness of breath with exertion not  at rest.  No excess mucus, no productive cough,  Notes  non-productive cough,  No coughing up of blood.  No change in color of mucus.  No wheezing.  No chest wall deformity  Skin: no rash or lesions.  GU: no dysuria, change in color of urine, no urgency or frequency.  No flank pain.  MS:  No joint pain or swelling.  No decreased range of motion.  No back pain.  Psych:  No change in mood or affect. No depression or anxiety.  No memory loss.     Objective:   Physical Exam  Filed Vitals:   05/15/14 1029  BP: 120/68  Pulse: 89  Temp: 96.8 F (36 C)  TempSrc: Oral  Height: 5\' 10"  (1.778 m)  Weight: 172 lb 6.4  oz (78.2 kg)  SpO2: 95%    Gen: Pleasant, well-nourished, in no distress,  normal affect  ENT: No lesions,  mouth clear,  oropharynx clear, no postnasal drip  Neck: No JVD, no TMG, no carotid bruits  Lungs: No use of accessory muscles, no dullness to percussion,distant BS  Cardiovascular: RRR, heart sounds normal, no murmur or gallops, no peripheral edema  Abdomen: soft and NT, no HSM,  BS normal  Musculoskeletal: No deformities, no cyanosis or clubbing  Neuro: alert, non focal  Skin: Warm, no lesions or rashes      Assessment & Plan:   COPD Gold C  Gold C Copd with probable progression.  Inadequate response to Breo alone Plan Stay on Breo daily Resume Spiriva two puff daily (respimat format) Breathing test ordered Flu vaccine and prevnar 13 was given Return 2 months      Updated Medication List Outpatient Encounter Prescriptions as of 05/15/2014  Medication Sig  . albuterol (PROAIR HFA) 108 (90 BASE) MCG/ACT inhaler Inhale 2 puffs into the lungs every 6 (six) hours as needed.  . Ascorbic Acid (VITAMIN C) 500 MG tablet Take 500 mg by mouth daily.   Marland Kitchen aspirin 81  MG tablet Take 81 mg by mouth. Twice weekly  . diltiazem (CARDIZEM) 120 MG tablet Take 2 tablets in the morning and 1 in the evening  . fish oil-omega-3 fatty acids 1000 MG capsule Take 1 g by mouth daily.   . Fluticasone Furoate-Vilanterol 100-25 MCG/INH AEPB Take 1 puff by mouth every morning.  Marland Kitchen LYRICA 150 MG capsule TAKE ONE CAPSULE BY MOUTH THREE TIMES DAILY  . Multiple Vitamin (MULTIVITAMIN) capsule Take 1 capsule by mouth daily.    . Oxycodone HCl 10 MG TABS Take 1 mg by mouth every 6 (six) hours as needed.  . tamsulosin (FLOMAX) 0.4 MG CAPS capsule Take 1 capsule (0.4 mg total) by mouth daily.  . [DISCONTINUED] Fluticasone Furoate-Vilanterol 100-25 MCG/INH AEPB 1 puff every morning.  . Tiotropium Bromide Monohydrate (SPIRIVA RESPIMAT) 2.5 MCG/ACT AERS Inhale 2 puffs into the lungs daily.  .  [DISCONTINUED] cyclobenzaprine (FLEXERIL) 5 MG tablet Take 5 mg by mouth 3 (three) times daily as needed.

## 2014-05-15 NOTE — Patient Instructions (Signed)
Stay on Breo daily Resume Spiriva two puff daily (respimat format) Breathing test ordered Flu vaccine and prevnar 13 was given Return 2 months

## 2014-05-15 NOTE — Assessment & Plan Note (Signed)
Gold C Copd with probable progression.  Inadequate response to Breo alone Plan Stay on Breo daily Resume Spiriva two puff daily (respimat format) Breathing test ordered Flu vaccine and prevnar 13 was given Return 2 months

## 2014-05-20 ENCOUNTER — Other Ambulatory Visit: Payer: Self-pay | Admitting: Neurology

## 2014-05-23 ENCOUNTER — Other Ambulatory Visit: Payer: Self-pay | Admitting: Neurology

## 2014-06-17 ENCOUNTER — Other Ambulatory Visit: Payer: Self-pay | Admitting: Cardiology

## 2014-06-17 ENCOUNTER — Other Ambulatory Visit: Payer: Self-pay | Admitting: Critical Care Medicine

## 2014-07-14 ENCOUNTER — Other Ambulatory Visit: Payer: Self-pay | Admitting: Critical Care Medicine

## 2014-07-14 DIAGNOSIS — R0600 Dyspnea, unspecified: Secondary | ICD-10-CM

## 2014-07-17 ENCOUNTER — Ambulatory Visit (INDEPENDENT_AMBULATORY_CARE_PROVIDER_SITE_OTHER): Payer: Medicare Other | Admitting: Critical Care Medicine

## 2014-07-17 ENCOUNTER — Encounter: Payer: Self-pay | Admitting: Critical Care Medicine

## 2014-07-17 VITALS — BP 124/62 | HR 75 | Temp 98.2°F | Ht 68.0 in | Wt 169.5 lb

## 2014-07-17 DIAGNOSIS — J441 Chronic obstructive pulmonary disease with (acute) exacerbation: Secondary | ICD-10-CM

## 2014-07-17 DIAGNOSIS — R0600 Dyspnea, unspecified: Secondary | ICD-10-CM

## 2014-07-17 LAB — PULMONARY FUNCTION TEST
FEF 25-75 Post: 0.63 L/sec
FEF 25-75 Pre: 0.52 L/sec
FEF2575-%Change-Post: 20 %
FEF2575-%Pred-Post: 29 %
FEF2575-%Pred-Pre: 24 %
FEV1-%CHANGE-POST: 9 %
FEV1-%PRED-POST: 53 %
FEV1-%Pred-Pre: 48 %
FEV1-Post: 1.55 L
FEV1-Pre: 1.41 L
FEV1FVC-%Change-Post: 0 %
FEV1FVC-%Pred-Pre: 70 %
FEV6-%Change-Post: 5 %
FEV6-%PRED-PRE: 71 %
FEV6-%Pred-Post: 75 %
FEV6-PRE: 2.67 L
FEV6-Post: 2.82 L
FEV6FVC-%Change-Post: -3 %
FEV6FVC-%PRED-PRE: 102 %
FEV6FVC-%Pred-Post: 98 %
FVC-%Change-Post: 9 %
FVC-%PRED-PRE: 69 %
FVC-%Pred-Post: 76 %
FVC-POST: 3.03 L
FVC-PRE: 2.76 L
PRE FEV6/FVC RATIO: 97 %
Post FEV1/FVC ratio: 51 %
Post FEV6/FVC ratio: 93 %
Pre FEV1/FVC ratio: 51 %

## 2014-07-17 MED ORDER — PREDNISONE 10 MG PO TABS: ORAL_TABLET | ORAL | Status: DC

## 2014-07-17 NOTE — Progress Notes (Signed)
Spirometry pre and post done today. 

## 2014-07-17 NOTE — Progress Notes (Signed)
Subjective:    Patient ID: [name] [name], male    DOB: 1940-12-07, 73 y.o.   MRN: [id number]  HPI  This is a  73 y.o.   WM with COPD Golds Stage C    07/17/2014 Chief Complaint  Patient presents with  . 2 month follow up    with PFTs.  Increased chest tightness, congestion, and DOE over the past few months.  Cough is nonprod.  Notes a decline over the past several months. Tries to clear throat.  No real mucus.    Review of Systems  Constitutional:   No  weight loss, night sweats,  Fevers, chills, fatigue, lassitude. HEENT:   No headaches,  Difficulty swallowing,  Tooth/dental problems,  Sore throat,                No sneezing, itching, ear ache,+ nasal congestion, +post nasal drip,   CV:  No chest pain,  Orthopnea, PND, swelling in lower extremities, anasarca, dizziness, palpitations  GI  No heartburn, ++indigestion, abdominal pain, nausea, vomiting, diarrhea, change in bowel habits, loss of appetite  Resp: Notes  shortness of breath with exertion not  at rest.  No excess mucus, no productive cough,  Notes  non-productive cough,  No coughing up of blood.  No change in color of mucus.  No wheezing.  No chest wall deformity  Skin: no rash or lesions.  GU: no dysuria, change in color of urine, no urgency or frequency.  No flank pain.  MS:  No joint pain or swelling.  No decreased range of motion.  No back pain.  Psych:  No change in mood or affect. No depression or anxiety.  No memory loss.     Objective:   Physical Exam  Filed Vitals:   07/17/14 1120  BP: 124/62  Pulse: 75  Temp: 98.2 F (36.8 C)  TempSrc: Oral  Height: 5\' 8"  (1.727 m)  Weight: 169 lb 8 oz (76.885 kg)  SpO2: 97%    Gen: Pleasant, well-nourished, in no distress,  normal affect  ENT: No lesions,  mouth clear,  oropharynx clear, no postnasal drip  Neck: No JVD, no TMG, no carotid bruits  Lungs: No use of accessory muscles, no dullness to percussion,distant BS  Cardiovascular: RRR, heart sounds  normal, no murmur or gallops, no peripheral edema  Abdomen: soft and NT, no HSM,  BS normal  Musculoskeletal: No deformities, no cyanosis or clubbing  Neuro: alert, non focal  Skin: Warm, no lesions or rashes      Assessment & Plan:   COPD with exacerbation Copd Gold B with acute exacerbation  Plan Stop fish oil Prednisone 10mg  Take 4 for two days three for two days two for two days one for two days No change in inhaled medications Return 4 months     Updated Medication List Outpatient Encounter Prescriptions as of 07/17/2014  Medication Sig  . Ascorbic Acid (VITAMIN C) 500 MG tablet Take 500 mg by mouth daily.   Marland Kitchen aspirin 81 MG tablet Take 81 mg by mouth. Twice weekly  . diltiazem (DILACOR XR) 120 MG 24 hr capsule TAKE TWO CAPSULES BY MOUTH IN THE MORNING AND ONE IN THE EVENING  . Fluticasone Furoate-Vilanterol 100-25 MCG/INH AEPB Take 1 puff by mouth every morning.  Marland Kitchen LYRICA 150 MG capsule TAKE ONE CAPSULE BY MOUTH THREE TIMES DAILY  . Multiple Vitamin (MULTIVITAMIN) capsule Take 1 capsule by mouth daily.    . Oxycodone HCl 10 MG TABS Take 1 mg  by mouth every 6 (six) hours as needed.  Marland Kitchen PROAIR HFA 108 (90 BASE) MCG/ACT inhaler INHALE TWO PUFFS INTO LUNGS EVERY 6 HOURS AS NEEDED  . tamsulosin (FLOMAX) 0.4 MG CAPS capsule Take 1 capsule (0.4 mg total) by mouth daily.  . Tiotropium Bromide Monohydrate (SPIRIVA RESPIMAT) 2.5 MCG/ACT AERS Inhale 2 puffs into the lungs daily.  . [DISCONTINUED] fish oil-omega-3 fatty acids 1000 MG capsule Take 1 g by mouth daily.   . predniSONE (DELTASONE) 10 MG tablet Take 4 for two days three for two days two for two days one for two days  . [DISCONTINUED] diltiazem (CARDIZEM) 120 MG tablet Take 2 tablets in the morning and 1 in the evening

## 2014-07-17 NOTE — Assessment & Plan Note (Signed)
Copd Gold B with acute exacerbation  Plan Stop fish oil Prednisone 10mg  Take 4 for two days three for two days two for two days one for two days No change in inhaled medications Return 4 months

## 2014-07-17 NOTE — Patient Instructions (Signed)
Stop fish oil Prednisone 10mg  Take 4 for two days three for two days two for two days one for two days No change in inhaled medications Return 4 months

## 2014-07-22 ENCOUNTER — Other Ambulatory Visit: Payer: Self-pay | Admitting: Cardiology

## 2014-07-31 ENCOUNTER — Encounter: Payer: Self-pay | Admitting: Cardiology

## 2014-07-31 ENCOUNTER — Ambulatory Visit (INDEPENDENT_AMBULATORY_CARE_PROVIDER_SITE_OTHER): Payer: Medicare Other | Admitting: Cardiology

## 2014-07-31 VITALS — BP 148/78 | HR 80 | Ht 70.0 in | Wt 170.0 lb

## 2014-07-31 DIAGNOSIS — I482 Chronic atrial fibrillation, unspecified: Secondary | ICD-10-CM

## 2014-07-31 DIAGNOSIS — J449 Chronic obstructive pulmonary disease, unspecified: Secondary | ICD-10-CM | POA: Insufficient documentation

## 2014-07-31 MED ORDER — DILTIAZEM HCL ER 120 MG PO CP24: 120.0000 mg | ORAL_CAPSULE | Freq: Every day | ORAL | Status: DC

## 2014-07-31 MED ORDER — DILTIAZEM HCL ER 120 MG PO CP24: ORAL_CAPSULE | ORAL | Status: DC

## 2014-07-31 NOTE — Patient Instructions (Signed)
Your physician wants you to follow-up in: 1 year You will receive a reminder letter in the mail two months in advance. If you don't receive a letter, please call our office to schedule the follow-up appointment.    Your physician recommends that you continue on your current medications as directed. Please refer to the Current Medication list given to you today.     Thank you for choosing [facility] !  

## 2014-07-31 NOTE — Assessment & Plan Note (Signed)
Maintaining sinus rhythm. Continue Cardizem CD at current dose (prescriptions refilled and corrected), also aspirin for now with relatively low thromboembolic risk score.

## 2014-07-31 NOTE — Addendum Note (Signed)
Addended by: Barbarann Ehlers A on: 07/31/2014 09:16 AM   Modules accepted: Orders, Medications

## 2014-07-31 NOTE — Progress Notes (Signed)
Reason for visit: Atrial fibrillation  Clinical Summary [name] [name] is a 73 y.o.male last seen in September 2014. He reports no new cardiac symptoms, specifically no angina, and no major sense of breakthrough palpitations. He continues on high-dose diltiazem and aspirin. Main complaints are related to COPD with chronic shortness of breath, intermittent cough and other flareups.  CHADSVASC score is 1 (age). He remains in sinus rhythm today by ECG.  No Known Allergies  Current Outpatient Prescriptions  Medication Sig Dispense Refill  . Ascorbic Acid (VITAMIN C) 500 MG tablet Take 500 mg by mouth daily.     Marland Kitchen aspirin 81 MG tablet Take 81 mg by mouth. Twice weekly    . Fluticasone Furoate-Vilanterol 100-25 MCG/INH AEPB Take 1 puff by mouth every morning.    Marland Kitchen LYRICA 150 MG capsule TAKE ONE CAPSULE BY MOUTH THREE TIMES DAILY 90 capsule 0  . Multiple Vitamin (MULTIVITAMIN) capsule Take 1 capsule by mouth daily.      . Oxycodone HCl 10 MG TABS Take 1 mg by mouth every 6 (six) hours as needed.    Marland Kitchen PROAIR HFA 108 (90 BASE) MCG/ACT inhaler INHALE TWO PUFFS INTO LUNGS EVERY 6 HOURS AS NEEDED 9 each 1  . Tiotropium Bromide Monohydrate (SPIRIVA RESPIMAT) 2.5 MCG/ACT AERS Inhale 2 puffs into the lungs daily. 4 g 11  . diltiazem (DILACOR XR) 120 MG 24 hr capsule Take 1 capsule (120 mg total) by mouth daily. 90 capsule 11   No current facility-administered medications for this visit.    Past Medical History  Diagnosis Date  . Mixed hyperlipidemia   . Palpitations   . DJD (degenerative joint disease), lumbar   . Peripheral neuropathy   . History of prostate cancer     Status post radioactive seed implants  . PAF (paroxysmal atrial fibrillation)   . Urethral stricture   . COPD with emphysema     Dr. Joya Gaskins, Gold stage C  . Incomplete emptying of bladder   . Wears partial dentures     Upper and lower  . Sciatic nerve pain     Secondary to shingles 2011  . History of shingles     Social  History Mr. Smalls reports that he quit smoking about 15 years ago. His smoking use included Cigarettes. He has a 80 pack-year smoking history. He has never used smokeless tobacco. Mr. Chrystal reports that he does not drink alcohol.  Review of Systems Complete review of systems negative except as otherwise outlined in the clinical summary and also the following. No recent fevers or chills. No leg edema, no orthopnea or PND.  Physical Examination Filed Vitals:   07/31/14 0840  BP: 148/78  Pulse: 80   Filed Weights   07/31/14 0840  Weight: 170 lb (77.111 kg)    Appears comfortable in no acute distress.  HEENT: Conjuctivae and lids normal, oropharynx clear with moist mucosa.  Neck: Supple, no elevated JVP, no loud carotid bruits, no thyromegaly or tenderness.  Lungs: Nonlabored breathing at rest. Diminished course breath sounds but no wheezes.  Cor: PMI nondisplaced. RRR. No pathologic systolic murmurs. No S3 or rub.  Abd: Soft, NTND. Normoactive bowel sounds.  Ext: No edema. Distal pulses 1+.    Problem List and Plan   ATRIAL FIBRILLATION, PAROXYSMAL Maintaining sinus rhythm. Continue Cardizem CD at current dose (prescriptions refilled and corrected), also aspirin for now with relatively low thromboembolic risk score.  COPD (chronic obstructive pulmonary disease) Keep follow-up with Dr. Joya Gaskins.  Satira Sark, M.D., F.A.C.C.

## 2014-07-31 NOTE — Assessment & Plan Note (Signed)
Keep followup with Dr. Wright. 

## 2014-08-19 ENCOUNTER — Other Ambulatory Visit: Payer: Self-pay | Admitting: Critical Care Medicine

## 2014-09-22 ENCOUNTER — Other Ambulatory Visit: Payer: Self-pay | Admitting: Critical Care Medicine

## 2014-10-22 ENCOUNTER — Other Ambulatory Visit: Payer: Self-pay | Admitting: Critical Care Medicine

## 2014-12-22 ENCOUNTER — Other Ambulatory Visit: Payer: Self-pay | Admitting: Critical Care Medicine

## 2015-02-20 ENCOUNTER — Other Ambulatory Visit: Payer: Self-pay | Admitting: Critical Care Medicine

## 2015-03-22 ENCOUNTER — Other Ambulatory Visit: Payer: Self-pay | Admitting: Critical Care Medicine

## 2015-03-27 ENCOUNTER — Other Ambulatory Visit: Payer: Self-pay | Admitting: Critical Care Medicine

## 2015-03-27 ENCOUNTER — Telehealth: Payer: Self-pay | Admitting: Critical Care Medicine

## 2015-03-27 NOTE — Telephone Encounter (Signed)
Pt is aware. He reports he does not need RX sent in as has several months supply left.

## 2015-03-27 NOTE — Telephone Encounter (Signed)
Spoke with pt. He is wanting to go back to the spiriva HH. He reports the respimat and HH works the same for him. He has [facility] on hand. Please advise Dr. Joya Gaskins thanks

## 2015-03-27 NOTE — Telephone Encounter (Signed)
i am ok with change back to [facility]

## 2015-04-21 ENCOUNTER — Other Ambulatory Visit: Payer: Self-pay | Admitting: Critical Care Medicine

## 2015-05-07 ENCOUNTER — Ambulatory Visit (INDEPENDENT_AMBULATORY_CARE_PROVIDER_SITE_OTHER): Payer: Medicare Other | Admitting: Critical Care Medicine

## 2015-05-07 ENCOUNTER — Encounter: Payer: Self-pay | Admitting: Critical Care Medicine

## 2015-05-07 VITALS — BP 124/68 | HR 72 | Temp 98.0°F | Ht 70.0 in | Wt 169.8 lb

## 2015-05-07 DIAGNOSIS — G629 Polyneuropathy, unspecified: Secondary | ICD-10-CM | POA: Diagnosis not present

## 2015-05-07 DIAGNOSIS — I482 Chronic atrial fibrillation, unspecified: Secondary | ICD-10-CM

## 2015-05-07 DIAGNOSIS — J449 Chronic obstructive pulmonary disease, unspecified: Secondary | ICD-10-CM | POA: Diagnosis not present

## 2015-05-07 DIAGNOSIS — C61 Malignant neoplasm of prostate: Secondary | ICD-10-CM

## 2015-05-07 NOTE — Assessment & Plan Note (Signed)
Stable Gold B Copd on breo/spiriva Plan  Cont spiriva and Breo Return 6 months

## 2015-05-07 NOTE — Assessment & Plan Note (Signed)
Hx of prostate cancer S/p xrt seed implants

## 2015-05-07 NOTE — Patient Instructions (Signed)
No new medication changes Get a Flu vaccine this fall Return 6 months

## 2015-05-07 NOTE — Progress Notes (Signed)
   Subjective:    Patient ID: [name] [name], male    DOB: 1941-06-14, 74 y.o.   MRN: [id number]  HPI 05/08/2015 Chief Complaint  Patient presents with  . Follow-up    Increased DOE  and congestion x 6 months.  Chest tightness and nonprod.  No wheezing or f/c/s.   Pt notes more dyspnea and congetstion for 6 months.  Worse in the heat.  No wheezing.  Notes cough dry  Pt denies any significant sore throat, nasal congestion or excess secretions, fever, chills, sweats, unintended weight loss, pleurtic or exertional chest pain, orthopnea PND, or leg swelling Pt denies any increase in rescue therapy over baseline, denies waking up needing it or having any early am or nocturnal exacerbations of coughing/wheezing/or dyspnea. Ptnotes  fluctuation in symptoms with  weather  Depression screen [facility] 2/9 05/07/2015  Decreased Interest 0  Down, Depressed, Hopeless 0  PHQ - 2 Score 0    Fall Risk  05/07/2015  Falls in the past year? No    Current Medications, Allergies, Complete Past Medical History, Past Surgical History, Family History, and Social History were reviewed in [place] record per todays encounter:  05/08/2015  Review of Systems  Constitutional: Negative.   HENT: Negative.  Negative for ear pain, postnasal drip, rhinorrhea, sinus pressure, sore throat, trouble swallowing and voice change.   Eyes: Negative.   Respiratory: Positive for cough, shortness of breath and wheezing. Negative for apnea, choking, chest tightness and stridor.   Cardiovascular: Negative.  Negative for chest pain, palpitations and leg swelling.  Gastrointestinal: Negative.  Negative for nausea, vomiting, abdominal pain and abdominal distention.  Genitourinary: Negative.   Musculoskeletal: Negative.  Negative for myalgias and arthralgias.  Skin: Negative.  Negative for rash.  Allergic/Immunologic: Negative.  Negative for environmental allergies and food allergies.  Neurological: Negative.   Negative for dizziness, syncope, weakness and headaches.  Hematological: Negative.  Negative for adenopathy. Does not bruise/bleed easily.  Psychiatric/Behavioral: Negative.  Negative for sleep disturbance and agitation. The patient is not nervous/anxious.        Objective:   Physical Exam Filed Vitals:   05/07/15 1131  BP: 124/68  Pulse: 72  Temp: 98 F (36.7 C)  TempSrc: Oral  Height: 5\' 10"  (1.778 m)  Weight: 169 lb 12.8 oz (77.021 kg)  SpO2: 96%    Gen: Pleasant, well-nourished, in no distress,  normal affect  ENT: No lesions,  mouth clear,  oropharynx clear, no postnasal drip  Neck: No JVD, no TMG, no carotid bruits  Lungs: No use of accessory muscles, no dullness to percussion, distant bS  Cardiovascular: RRR, heart sounds normal, no murmur or gallops, no peripheral edema  Abdomen: soft and NT, no HSM,  BS normal  Musculoskeletal: No deformities, no cyanosis or clubbing  Neuro: alert, non focal  Skin: Warm, no lesions or rashes  No results found.        Assessment & Plan:  I personally reviewed all images and lab data in the [facility] system as well as any outside material available during this office visit and agree with the  radiology impressions.   Neuropathy Chronic neuropathy. Sees a pain specialist   Prostate cancer Hx of prostate cancer S/p xrt seed implants   ATRIAL FIBRILLATION, PAROXYSMAL Stable atrial fib Not on anticoagulation , is on aspirin  COPD with chronic bronchitis Gold B  Stable Gold B Copd on breo/spiriva Plan  Cont spiriva and Breo Return 6 months

## 2015-05-07 NOTE — Assessment & Plan Note (Signed)
Chronic neuropathy. Sees a pain specialist

## 2015-05-07 NOTE — Assessment & Plan Note (Signed)
Stable atrial fib Not on anticoagulation , is on aspirin

## 2015-05-24 ENCOUNTER — Other Ambulatory Visit: Payer: Self-pay | Admitting: Critical Care Medicine

## 2015-06-24 ENCOUNTER — Other Ambulatory Visit: Payer: Self-pay | Admitting: Critical Care Medicine

## 2015-07-10 ENCOUNTER — Other Ambulatory Visit: Payer: Self-pay | Admitting: *Deleted

## 2015-07-24 ENCOUNTER — Other Ambulatory Visit: Payer: Self-pay | Admitting: Critical Care Medicine

## 2015-08-06 ENCOUNTER — Telehealth: Payer: Self-pay | Admitting: Cardiology

## 2015-08-06 MED ORDER — DILTIAZEM HCL ER 120 MG PO CP24: ORAL_CAPSULE | ORAL | Status: DC

## 2015-08-06 NOTE — Telephone Encounter (Signed)
Pt's Rx was sent to pt's pharmacy, as requested. Confirmation received.

## 2015-08-06 NOTE — Telephone Encounter (Signed)
°*  STAT* If patient is at the pharmacy, call can be transferred to refill team.   1. Which medications need to be refilled? (please list name of each medication and dose if known) Diltiazem  2. Which pharmacy/location (including street and city if local pharmacy) is medication to be sent to? [facility] 3. Do they need a 30 day or 90 day supply? 30 day supply

## 2015-08-20 ENCOUNTER — Ambulatory Visit (INDEPENDENT_AMBULATORY_CARE_PROVIDER_SITE_OTHER): Payer: Medicare Other | Admitting: Cardiology

## 2015-08-20 ENCOUNTER — Encounter: Payer: Self-pay | Admitting: Cardiology

## 2015-08-20 VITALS — BP 122/72 | HR 88 | Ht 70.0 in | Wt 170.2 lb

## 2015-08-20 DIAGNOSIS — I4891 Unspecified atrial fibrillation: Secondary | ICD-10-CM | POA: Diagnosis not present

## 2015-08-20 DIAGNOSIS — J449 Chronic obstructive pulmonary disease, unspecified: Secondary | ICD-10-CM

## 2015-08-20 DIAGNOSIS — F17201 Nicotine dependence, unspecified, in remission: Secondary | ICD-10-CM

## 2015-08-20 DIAGNOSIS — I480 Paroxysmal atrial fibrillation: Secondary | ICD-10-CM

## 2015-08-20 MED ORDER — DILTIAZEM HCL ER 120 MG PO CP24: ORAL_CAPSULE | ORAL | Status: DC

## 2015-08-20 NOTE — Progress Notes (Signed)
Cardiology Office Note  Date: 08/20/2015   ID: [name] [name], DOB 1941/02/12, MRN [id number]  PCP: Lanette Hampshire, MD  Primary Cardiologist: Rozann Lesches, MD   Chief Complaint  Patient presents with  . Atrial Fibrillation    History of Present Illness: [name] [name] is a 74 y.o. male last seen in November 2015. He presents for a routine follow-up visit. States that he has only occasional brief palpitations, nothing that has been progressive or prolonged over the last year. Today he is in normal sinus rhythm, confirmed by ECG.  CHADSVASC score is 1. Annual risk of stroke on aspirin is 1.2%. We have discussed this and plan to continue aspirin at this point. He also has had no intolerances with Cardizem CD 120 mg daily.  He continues to follow in the Pulmonary Division for COPD.   Past Medical History  Diagnosis Date  . Mixed hyperlipidemia   . Palpitations   . DJD (degenerative joint disease), lumbar   . Peripheral neuropathy ([place])   . History of prostate cancer     Status post radioactive seed implants  . PAF (paroxysmal atrial fibrillation) ([place])   . Urethral stricture   . COPD with emphysema ([place])     Dr. Joya Gaskins, Gold stage C  . Incomplete emptying of bladder   . Wears partial dentures     Upper and lower  . Sciatic nerve pain     Secondary to shingles 2011  . History of shingles     Medications: ProAir HFA 108 g 2 puffs every 6 hours Aspirin 81 mg by mouth daily Breo Ellipta 100/25 g 1 inhalation daily Dilacor XR 120 mg by mouth daily Lyrica 100 mg by mouth 3 times a day Multivitamin 1 by mouth daily Oxycodone 10 mg by mouth every 6 hours when necessary Spiriva 2.5 g 2 inhalations daily  Allergies:  Review of patient's allergies indicates no known allergies.   Social History: The patient  reports that he quit smoking about 16 years ago. His smoking use included Cigarettes. He has a 80 pack-year smoking history. He has never used smokeless tobacco. He  reports that he does not drink alcohol or use illicit drugs.   ROS:  Please see the history of present illness. Otherwise, complete review of systems is positive for chronic dyspnea on exertion.  All other systems are reviewed and negative.   Physical Exam: VS:  BP 122/72 mmHg  Pulse 88  Ht 5\' 10"  (1.778 m)  Wt 170 lb 3.2 oz (77.202 kg)  BMI 24.42 kg/m2  SpO2 95%, BMI Body mass index is 24.42 kg/(m^2).  Wt Readings from Last 3 Encounters:  08/20/15 170 lb 3.2 oz (77.202 kg)  05/07/15 169 lb 12.8 oz (77.021 kg)  07/31/14 170 lb (77.111 kg)    Appears comfortable in no acute distress.  HEENT: Conjuctivae and lids normal, oropharynx clear.  Neck: Supple, no elevated JVP, no loud carotid bruits, no thyromegaly or tenderness.  Lungs: Nonlabored breathing at rest. Diminished course breath sounds but no wheezes.  Cor: PMI nondisplaced. RRR. No pathologic systolic murmurs. No S3 or rub.  Abd: Soft, NTND. Normoactive bowel sounds.  Ext: No edema. Distal pulses 1+.   ECG: ECG is ordered today.  ASSESSMENT AND PLAN:  1. Paroxysmal atrial fibrillation, noted to be in sinus rhythm today. He has only occasional, brief palpitations. With low thromboembolic risk score, we will continue aspirin and Cardizem CD 120 mg daily.  2. COPD, keep follow-up with Pulmonary.  3. Tobacco abuse in remission.  Current medicines were reviewed at length with the patient today.   Orders Placed This Encounter  Procedures  . EKG 12-Lead    Disposition: FU with me in 1 year.   Signed, Satira Sark, MD, [facility] 08/20/2015 12:00 PM    [facility] [place]. [address] [place], [place] [address] Phone: [phone]; Fax: [phone]

## 2015-08-20 NOTE — Patient Instructions (Signed)
Your physician wants you to follow-up in: 1 year with Dr McDowell You will receive a reminder letter in the mail two months in advance. If you don't receive a letter, please call our office to schedule the follow-up appointment.    Your physician recommends that you continue on your current medications as directed. Please refer to the Current Medication list given to you today.     If you need a refill on your cardiac medications before your next appointment, please call your pharmacy.     Thank you for choosing [facility] !        

## 2015-08-22 ENCOUNTER — Other Ambulatory Visit: Payer: Self-pay | Admitting: Adult Health

## 2015-08-22 ENCOUNTER — Telehealth: Payer: Self-pay | Admitting: Adult Health

## 2015-08-22 NOTE — Telephone Encounter (Signed)
Refill was already sent in earlier. Called spoke with spouse and made aware. Nothing further needed

## 2015-09-21 ENCOUNTER — Other Ambulatory Visit: Payer: Self-pay | Admitting: Critical Care Medicine

## 2015-09-26 ENCOUNTER — Other Ambulatory Visit: Payer: Self-pay

## 2015-09-26 MED ORDER — ALBUTEROL SULFATE HFA 108 (90 BASE) MCG/ACT IN AERS: 2.0000 | INHALATION_SPRAY | Freq: Four times a day (QID) | RESPIRATORY_TRACT | Status: DC | PRN

## 2015-10-29 ENCOUNTER — Other Ambulatory Visit: Payer: Medicare Other

## 2015-10-29 ENCOUNTER — Ambulatory Visit (INDEPENDENT_AMBULATORY_CARE_PROVIDER_SITE_OTHER): Payer: Medicare Other | Admitting: Pulmonary Disease

## 2015-10-29 ENCOUNTER — Encounter: Payer: Self-pay | Admitting: Pulmonary Disease

## 2015-10-29 VITALS — BP 124/56 | HR 80 | Ht 70.0 in | Wt 168.8 lb

## 2015-10-29 DIAGNOSIS — J449 Chronic obstructive pulmonary disease, unspecified: Secondary | ICD-10-CM

## 2015-10-29 DIAGNOSIS — Z23 Encounter for immunization: Secondary | ICD-10-CM

## 2015-10-29 NOTE — Patient Instructions (Signed)
1. Continue taking Spiriva as prescribed. 2. You can stop using Breo. 3. Please call me if you have any new breathing problems before your next appointment. 4. I will see you back in 3 months or sooner if needed.  TESTS ORDERED: 1. Alpha-1 antitrypsin phenotype today 2. Full pulmonary function testing at next appointment 3. 6 minute walk test at next appointment

## 2015-10-29 NOTE — Progress Notes (Signed)
Subjective:    Patient ID: [name] [name], male    DOB: 11-08-1940, 75 y.o.   MRN: [id number]  C.C.:  Follow-up for Severe COPD.  HPI Severe COPD:  He reports he has no wheezing and only rare coughing. He reports changes in weather seem to cause chest tightness & dyspnea. He denies any recurrent bronchitis. He reports he hasn't had an exacerbation for some time. He uses his rescue inhaler mostly in the morning before he gets up and at night before bed more out of habit.   Review of Systems No fever, chills, or sweats. No nausea, emesis or diarrhea.   No Known Allergies  Current Outpatient Prescriptions on File Prior to Visit  Medication Sig Dispense Refill  . albuterol (PROAIR HFA) 108 (90 Base) MCG/ACT inhaler Inhale 2 puffs into the lungs every 6 (six) hours as needed for wheezing or shortness of breath. 18 g 3  . Ascorbic Acid (VITAMIN C) 500 MG tablet Take 500 mg by mouth. Couple times weekly    . aspirin 81 MG tablet Take 81 mg by mouth. Twice weekly    . BREO ELLIPTA 100-25 MCG/INH AEPB INHALE ONE PUFF BY MOUTH ONCE DAILY **PLEASE  SCHEDULE  APPOINTMENT  FOR  FURTHER  REFILLS** 60 each 2  . diltiazem (DILACOR XR) 120 MG 24 hr capsule Take 2 tabs in the am, and 1 tab in the pm 180 capsule 3  . LYRICA 150 MG capsule TAKE ONE CAPSULE BY MOUTH THREE TIMES DAILY 90 capsule 0  . Multiple Vitamin (MULTIVITAMIN) capsule Take 1 capsule by mouth. Couple times weekly    . Oxycodone HCl 10 MG TABS Take 1 mg by mouth every 6 (six) hours as needed.    Marland Kitchen SPIRIVA RESPIMAT 2.5 MCG/ACT AERS INHALE TWO SPRAY(S) BY MOUTH ONCE DAILY 4 g 10   No current facility-administered medications on file prior to visit.    Past Medical History  Diagnosis Date  . Mixed hyperlipidemia   . Palpitations   . DJD (degenerative joint disease), lumbar   . Peripheral neuropathy ([place])   . History of prostate cancer     Status post radioactive seed implants  . PAF (paroxysmal atrial fibrillation) ([place])   . Urethral  stricture   . COPD with emphysema ([place])     Dr. Joya Gaskins, Gold stage C  . Incomplete emptying of bladder   . Wears partial dentures     Upper and lower  . Sciatic nerve pain     Secondary to shingles 2011  . History of shingles     Past Surgical History  Procedure Laterality Date  . Cystoscopy with urethral dilatation  09/15/2011    Procedure: CYSTOSCOPY WITH URETHRAL DILATATION;  Surgeon: Bernestine Amass, MD;  [facility]: [facility];  Service: Urology;  Laterality: N/A;  CYSTOSCOPY, BALLOON DILATION   . Cataract extraction w/ intraocular lens  implant, bilateral  2012  . Radioactive prostate seed implants/  cysto with balloon dilation bladder neck contracture  11-26-2006  . Cysto/  balloon dilation and incision bladder neck contracture  X5  LAST ONE 01-24-2009  . Colonoscopy  02-14-2003  . Cystoscopy with urethral dilatation N/A 04/03/2014    Procedure: CYSTOSCOPY WITH dilatation;  Surgeon: Bernestine Amass, MD;  Location[facility];  Service: Urology;  Laterality: N/A;    Family History  Problem Relation Age of Onset  . COPD Mother   . Heart disease Brother     Social History  Social History  . Marital Status: Married    Spouse Name: [name]  . Number of Children: 3  . Years of Education: 9 th   Occupational History  .      Retired   Social History Main Topics  . Smoking status: Former Smoker -- 2.00 packs/day for 40 years    Types: Cigarettes    Start date: 06/19/1954    Quit date: 09/15/1998  . Smokeless tobacco: Never Used  . Alcohol Use: No  . Drug Use: No  . Sexual Activity: Not Asked   Other Topics Concern  . None   Social History Narrative   Patient lives at home with [name]). Patient is retired and has three daughters and one step daughter. Patient has a 9th grade education.   Right handed.   Caffeine- three  cups daily.      [facility]:   Originally from [place], [name]. Always lived in [name]. 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 Has a dog currently. No bird, mold, or asbestos exposure. 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       Objective:   Physical Exam BP 124/56 mmHg  Pulse 80  Ht 5\' 10"  (1.778 m)  Wt 168 lb 12.8 oz (76.567 kg)  BMI 24.22 kg/m2  SpO2 96% General:  Awake. Alert. No acute distress.  Integument:  Warm & dry. No rash on exposed skin.  HEENT:  Moist mucus membranes. No oral ulcers. No scleral injection or icterus.  Cardiovascular:  Regular rate. No edema. No appreciable JVD.  Pulmonary:  Good aeration & clear to auscultation bilaterally. Symmetric chest wall expansion. No accessory muscle use. Abdomen: Soft. Normal bowel sounds. Nondistended. Grossly nontender. Musculoskeletal:  Normal bulk and tone. No joint deformity or effusion appreciated.  PFT 03/03/14: FVC 2.47 L (61%) FEV1 1.25 L (42%) FEV1/FVC 0.50 FEF 25-75 0.44 L (20%) no bronchodilator response 07/17/14: FVC 2.76 L (69%) FEV1 1.41 L (48%) FEV1/FVC 0.51 FEF 25-75 0.52 L (24%) no bronchodilator response  LABS 04/25/14 ABG on RA:  7.42 / 36 / 68.7     Assessment & Plan:  75 year old male with severe COPD. Patient has had no symptomatic benefit with the addition of Breo to his regimen. Patient does not seem to have a chronic bronchitis type picture. I believe Spiriva may be sufficient to control his underlying symptoms. We did spend a significant amount time today discussing his prior tobacco use and increased risk for lung cancer. I feel it's reasonable to consider low-dose chest CT imaging for lung cancer screening. We also discussed stem cell transplant therapy for his underlying COPD. I informed him I would look into the specific centers and question to research it further for him. I instructed the patient contact my office if he had any new breathing problems before his next appointment.  1. Severe COPD: Screening for alpha-1 antitrypsin  deficiency. Continuing patient on Spiriva. Discontinuing Breo. Checking full pulmonary function testing and 6 but walk test on room air at next appointment. 2. Prior tobacco use: Referring to our lung cancer screening coordinator for evaluation for possible low dose chest CT imaging. 3. Health maintenance: Administering influenza vaccine today. Received Prevnar vaccine 05/15/14 & Pneumovax 05/18/13. 4. Follow-up: Patient to return to clinic in 3 months or sooner if needed.

## 2015-11-03 LAB — ALPHA-1 ANTITRYPSIN PHENOTYPE: A-1 Antitrypsin: 143 mg/dL (ref 83–199)

## 2015-11-06 ENCOUNTER — Telehealth: Payer: Self-pay | Admitting: Pulmonary Disease

## 2015-11-06 NOTE — Telephone Encounter (Signed)
It sounds as if he is starting to feel better  Would give it a few more days if not working call back or worse call sooner. Please contact office for sooner follow up if symptoms do not improve or worsen or seek emergency care

## 2015-11-06 NOTE — Telephone Encounter (Signed)
Called spoke with pt. He reports he will give it a few more days and will call if not better by friday

## 2015-11-06 NOTE — Telephone Encounter (Signed)
Per 10/29/15 OV; Patient Instructions       1. Continue taking Spiriva as prescribed. 2. You can stop using Breo. 3. Please call me if you have any new breathing problems before your next appointment. 4. I will see you back in 3 months or sooner if needed.  TESTS ORDERED: 1. Alpha-1 antitrypsin phenotype today 2. Full pulmonary function testing at next appointment 3. 6 minute walk test at next appointment  --- Called spoke with pt. He reports he stopped taking his breo. He reports about 2-3 days afterwards, he began to feel the spiriva was holding him over. He restarted the breo 2 days ago but is still not back to his self yet. He reports he is having problem moving around without feeling SOB. He wants to know if he can take something else to get him back to his regular or continue to wait for the breo to take effect? JN is working nights. Please advise TP thanks

## 2015-11-09 ENCOUNTER — Encounter (HOSPITAL_COMMUNITY): Payer: Self-pay

## 2015-11-09 ENCOUNTER — Emergency Department (HOSPITAL_COMMUNITY)
Admission: EM | Admit: 2015-11-09 | Discharge: 2015-11-09 | Disposition: A | Discharge status: Home / Self Care | Payer: Medicare Other | Attending: Emergency Medicine | Admitting: Emergency Medicine

## 2015-11-09 ENCOUNTER — Emergency Department (HOSPITAL_COMMUNITY): Payer: Medicare Other

## 2015-11-09 DIAGNOSIS — Z79899 Other long term (current) drug therapy: Secondary | ICD-10-CM | POA: Diagnosis not present

## 2015-11-09 DIAGNOSIS — J209 Acute bronchitis, unspecified: Secondary | ICD-10-CM | POA: Insufficient documentation

## 2015-11-09 DIAGNOSIS — R0602 Shortness of breath: Secondary | ICD-10-CM | POA: Diagnosis present

## 2015-11-09 DIAGNOSIS — Z8546 Personal history of malignant neoplasm of prostate: Secondary | ICD-10-CM | POA: Diagnosis not present

## 2015-11-09 DIAGNOSIS — Z8739 Personal history of other diseases of the musculoskeletal system and connective tissue: Secondary | ICD-10-CM | POA: Diagnosis not present

## 2015-11-09 DIAGNOSIS — Z8619 Personal history of other infectious and parasitic diseases: Secondary | ICD-10-CM | POA: Diagnosis not present

## 2015-11-09 DIAGNOSIS — J40 Bronchitis, not specified as acute or chronic: Secondary | ICD-10-CM

## 2015-11-09 DIAGNOSIS — J440 Chronic obstructive pulmonary disease with acute lower respiratory infection: Secondary | ICD-10-CM | POA: Diagnosis not present

## 2015-11-09 DIAGNOSIS — I480 Paroxysmal atrial fibrillation: Secondary | ICD-10-CM | POA: Insufficient documentation

## 2015-11-09 DIAGNOSIS — F17210 Nicotine dependence, cigarettes, uncomplicated: Secondary | ICD-10-CM | POA: Diagnosis not present

## 2015-11-09 DIAGNOSIS — E782 Mixed hyperlipidemia: Secondary | ICD-10-CM | POA: Diagnosis not present

## 2015-11-09 DIAGNOSIS — J441 Chronic obstructive pulmonary disease with (acute) exacerbation: Secondary | ICD-10-CM

## 2015-11-09 LAB — CBC WITH DIFFERENTIAL/PLATELET
Basophils Absolute: 0 10*3/uL (ref 0.0–0.1)
Basophils Relative: 0 %
EOS PCT: 5 %
Eosinophils Absolute: 0.4 10*3/uL (ref 0.0–0.7)
HCT: 41.1 % (ref 39.0–52.0)
Hemoglobin: 14.9 g/dL (ref 13.0–17.0)
LYMPHS ABS: 1.9 10*3/uL (ref 0.7–4.0)
LYMPHS PCT: 24 %
MCH: 31.2 pg (ref 26.0–34.0)
MCHC: 36.3 g/dL — ABNORMAL HIGH (ref 30.0–36.0)
MCV: 86.2 fL (ref 78.0–100.0)
MONO ABS: 1.1 10*3/uL — AB (ref 0.1–1.0)
Monocytes Relative: 14 %
NEUTROS ABS: 4.5 10*3/uL (ref 1.7–7.7)
NEUTROS PCT: 57 %
PLATELETS: 228 10*3/uL (ref 150–400)
RBC: 4.77 MIL/uL (ref 4.22–5.81)
RDW: 13.9 % (ref 11.5–15.5)
WBC: 7.9 10*3/uL (ref 4.0–10.5)

## 2015-11-09 LAB — BASIC METABOLIC PANEL
Anion gap: 8 (ref 5–15)
BUN: 9 mg/dL (ref 6–20)
CHLORIDE: 106 mmol/L (ref 101–111)
CO2: 24 mmol/L (ref 22–32)
CREATININE: 1.29 mg/dL — AB (ref 0.61–1.24)
Calcium: 8.9 mg/dL (ref 8.9–10.3)
GFR calc Af Amer: >60 mL/min (ref >60)
GFR calc non Af Amer: 53 mL/min — ABNORMAL LOW (ref >60)
GLUCOSE: 124 mg/dL — AB (ref 65–99)
Potassium: 3.5 mmol/L (ref 3.5–5.1)
SODIUM: 138 mmol/L (ref 135–145)

## 2015-11-09 LAB — TROPONIN I: Troponin I: <0.03 ng/mL (ref <0.031)

## 2015-11-09 MED ORDER — ALBUTEROL SULFATE (2.5 MG/3ML) 0.083% IN NEBU
2.5000 mg | INHALATION_SOLUTION | Freq: Once | RESPIRATORY_TRACT | Status: AC
  Administered 2015-11-09: 2.5 mg via RESPIRATORY_TRACT
  Filled 2015-11-09: qty 3

## 2015-11-09 MED ORDER — METHYLPREDNISOLONE SODIUM SUCC 125 MG IJ SOLR
125.0000 mg | Freq: Once | INTRAMUSCULAR | Status: AC
  Administered 2015-11-09: 125 mg via INTRAVENOUS
  Filled 2015-11-09: qty 2

## 2015-11-09 MED ORDER — PREDNISONE 10 MG PO TABS: 20.0000 mg | ORAL_TABLET | Freq: Every day | ORAL | Status: DC

## 2015-11-09 MED ORDER — AZITHROMYCIN 250 MG PO TABS: ORAL_TABLET | ORAL | Status: DC

## 2015-11-09 MED ORDER — IPRATROPIUM-ALBUTEROL 0.5-2.5 (3) MG/3ML IN SOLN
3.0000 mL | Freq: Once | RESPIRATORY_TRACT | Status: AC
  Administered 2015-11-09: 3 mL via RESPIRATORY_TRACT
  Filled 2015-11-09: qty 3

## 2015-11-09 NOTE — ED Provider Notes (Signed)
CSN: [id number]     Arrival date & time 11/09/15  [id number] History  By signing my name below, I, Terressa Koyanagi, attest that this documentation has been prepared under the direction and in the presence of Milton Ferguson, MD. Electronically Signed: Terressa Koyanagi, ED Scribe. 11/09/2015. 10:32 AM.   Chief Complaint  Patient presents with  . Shortness of Breath   Patient is a 75 y.o. male presenting with shortness of breath. The history is provided by the patient. No language interpreter was used.  Shortness of Breath Severity:  Moderate Onset quality:  Sudden Duration:  1 week Timing:  Intermittent Chronicity:  Recurrent Relieved by:  Nothing Worsened by:  Nothing tried Ineffective treatments:  None tried Associated symptoms: no abdominal pain, no chest pain, no cough, no fever, no headaches and no rash    PCP: Dr. Mal Amabile  HPI Comments: JATHAN KOTARSKI is a 75 y.o. male, with PMHx noted below including COPD with emphysema, who presents to the Emergency Department complaining of recurrent intermittent SOB onset one week ago. Pt denies any recent cold like Sx including cough or fever. Pt further denies chest pain. Pt confirms getting the flu vaccine this year.  Past Medical History  Diagnosis Date  . Mixed hyperlipidemia   . Palpitations   . DJD (degenerative joint disease), lumbar   . Peripheral neuropathy ([place])   . History of prostate cancer     Status post radioactive seed implants  . PAF (paroxysmal atrial fibrillation) ([place])   . Urethral stricture   . COPD with emphysema ([place])     Dr. Joya Gaskins, Gold stage C  . Incomplete emptying of bladder   . Wears partial dentures     Upper and lower  . Sciatic nerve pain     Secondary to shingles 2011  . History of shingles    Past Surgical History  Procedure Laterality Date  . Cystoscopy with urethral dilatation  09/15/2011    Procedure: CYSTOSCOPY WITH URETHRAL DILATATION;  Surgeon: Bernestine Amass, MD;  [facility];   Service: Urology;  Laterality: N/A;  CYSTOSCOPY, BALLOON DILATION   . Cataract extraction w/ intraocular lens  implant, bilateral  2012  . Radioactive prostate seed implants/  cysto with balloon dilation bladder neck contracture  11-26-2006  . Cysto/  balloon dilation and incision bladder neck contracture  X5  LAST ONE 01-24-2009  . Colonoscopy  02-14-2003  . Cystoscopy with urethral dilatation N/A 04/03/2014    Procedure: CYSTOSCOPY WITH dilatation;  Surgeon: Bernestine Amass, MD;  Location: [facility];  Service: Urology;  Laterality: N/A;   Family History  Problem Relation Age of Onset  . COPD Mother   . Heart disease Brother    Social History  Substance Use Topics  . Smoking status: Former Smoker -- 2.00 packs/day for 40 years    Types: Cigarettes    Start date: 06/19/1954    Quit date: 09/15/1998  . Smokeless tobacco: Never Used  . Alcohol Use: No    Review of Systems  Constitutional: Negative for fever, appetite change and fatigue.  HENT: Negative for congestion, ear discharge and sinus pressure.   Eyes: Negative for discharge.  Respiratory: Positive for shortness of breath. Negative for cough.   Cardiovascular: Negative for chest pain.  Gastrointestinal: Negative for abdominal pain and diarrhea.  Genitourinary: Negative for frequency and hematuria.  Musculoskeletal: Negative for back pain.  Skin: Negative for rash.  Neurological: Negative for seizures and headaches.  Psychiatric/Behavioral: Negative  for hallucinations.   Allergies  Review of patient's allergies indicates no known allergies.  Home Medications   Prior to Admission medications   Medication Sig Start Date End Date Taking? Authorizing Provider  albuterol (PROAIR HFA) 108 (90 Base) MCG/ACT inhaler Inhale 2 puffs into the lungs every 6 (six) hours as needed for wheezing or shortness of breath. 09/26/15   Elsie Stain, MD  Ascorbic Acid (VITAMIN C) 500 MG tablet Take 500 mg by mouth. Couple  times weekly    Historical Provider, MD  aspirin 81 MG tablet Take 81 mg by mouth. Twice weekly    Historical Provider, MD  BREO ELLIPTA 100-25 MCG/INH AEPB INHALE ONE PUFF BY MOUTH ONCE DAILY **PLEASE  SCHEDULE  APPOINTMENT  FOR  FURTHER  REFILLS** 08/22/15   Javier Glazier, MD  diltiazem (DILACOR XR) 120 MG 24 hr capsule Take 2 tabs in the am, and 1 tab in the pm 08/20/15   Satira Sark, MD  LYRICA 150 MG capsule TAKE ONE CAPSULE BY MOUTH THREE TIMES DAILY 05/23/14   Marcial Pacas, MD  Multiple Vitamin (MULTIVITAMIN) capsule Take 1 capsule by mouth. Couple times weekly    Historical Provider, MD  Oxycodone HCl 10 MG TABS Take 1 mg by mouth every 6 (six) hours as needed.    Historical Provider, MD  SPIRIVA RESPIMAT 2.5 MCG/ACT AERS INHALE TWO SPRAY(S) BY MOUTH ONCE DAILY 05/25/15   Elsie Stain, MD   Triage Vitals: BP 142/76 mmHg  Pulse 105  Temp(Src) 99.2 F (37.3 C) (Oral)  Resp 18  Ht 5\' 9"  (1.753 m)  Wt 165 lb (74.844 kg)  BMI 24.36 kg/m2  SpO2 96% Physical Exam  Constitutional: He is oriented to person, place, and time. He appears well-developed.  HENT:  Head: Normocephalic.  Eyes: Conjunctivae and EOM are normal. No scleral icterus.  Neck: Neck supple. No thyromegaly present.  Cardiovascular: Normal rate and regular rhythm.  Exam reveals no gallop and no friction rub.   No murmur heard. Pulmonary/Chest: No stridor. He has wheezes (moderate wheezing throughout ). He has no rales. He exhibits no tenderness.  Abdominal: He exhibits no distension. There is no tenderness. There is no rebound.  Musculoskeletal: Normal range of motion. He exhibits no edema.  Lymphadenopathy:    He has no cervical adenopathy.  Neurological: He is oriented to person, place, and time. He exhibits normal muscle tone. Coordination normal.  Skin: No rash noted. No erythema.  Psychiatric: He has a normal mood and affect. His behavior is normal.    ED Course  Procedures (including critical care  time) DIAGNOSTIC STUDIES: Oxygen Saturation is 96% on ra, nl by my interpretation.    COORDINATION OF CARE: 10:32 AM: Discussed treatment plan which includes chest xray, labs and EKG with pt at bedside; patient verbalizes understanding and agrees with treatment plan.  Labs Review Labs Reviewed  CBC WITH DIFFERENTIAL/PLATELET - Abnormal; Notable for the following:    MCHC 36.3 (*)    Monocytes Absolute 1.1 (*)    All other components within normal limits  BASIC METABOLIC PANEL  TROPONIN I    Imaging Review Dg Chest Portable 1 View  11/09/2015  CLINICAL DATA:  Shortness of breath, worsening over the last week. Smoking history. EXAM: PORTABLE CHEST 1 VIEW COMPARISON:  06/25/2012 FINDINGS: Artifact overlies the chest. Heart size is normal. No evidence of heart failure or effusion. Question infiltrate at the right base medially. IMPRESSION: Question right lower lobe pneumonia. Two-view chest radiography recommended  when able. Electronically Signed   By: Nelson Chimes M.D.   On: 11/09/2015 10:26   I have personally reviewed and evaluated these images and lab results as part of my medical decision-making.   EKG Interpretation None      MDM   Final diagnoses:  None   Patient with COPD and bronchitis. Patient improved with neb treatments. He'll be sent home with prednisone and Z-Pak  The chart was scribed for me under my direct supervision.  I personally performed the history, physical, and medical decision making and all procedures in the evaluation of this patient.Milton Ferguson, MD 11/09/15 [phone]

## 2015-11-09 NOTE — Discharge Instructions (Signed)
Follow up next week for recheck °

## 2015-11-09 NOTE — ED Notes (Signed)
Pt c/o sob x 1 week.  Denies cough, fever, or chest pain.

## 2015-11-14 ENCOUNTER — Telehealth: Payer: Self-pay | Admitting: *Deleted

## 2015-11-14 NOTE — Telephone Encounter (Addendum)
-----   Message from Javier Glazier, MD sent at 11/13/2015  6:40 PM EST ----- [name] [name], Please call [name] [name] and let him know I researched the center in [place] doing stem cell transplant for lung disease. As I suspected, this does not appear to be university affiliated nor do I suspect it will provide him with any relief from his lung disease. Thanks. ----  Called spoke with pt and made aware of above. He verbalized understanding and had no questions.

## 2015-11-23 ENCOUNTER — Other Ambulatory Visit: Payer: Self-pay | Admitting: Acute Care

## 2015-11-23 DIAGNOSIS — Z87891 Personal history of nicotine dependence: Secondary | ICD-10-CM

## 2015-11-29 ENCOUNTER — Other Ambulatory Visit: Payer: Self-pay | Admitting: Pulmonary Disease

## 2015-11-30 ENCOUNTER — Encounter: Payer: Self-pay | Admitting: Acute Care

## 2015-11-30 ENCOUNTER — Ambulatory Visit (INDEPENDENT_AMBULATORY_CARE_PROVIDER_SITE_OTHER): Payer: Medicare Other | Admitting: Acute Care

## 2015-11-30 ENCOUNTER — Ambulatory Visit: Admission: RE | Admit: 2015-11-30 | Payer: Medicare Other | Source: Ambulatory Visit

## 2015-11-30 DIAGNOSIS — Z87891 Personal history of nicotine dependence: Secondary | ICD-10-CM

## 2015-11-30 NOTE — Progress Notes (Signed)
Shared Decision Making Visit Lung Cancer Screening Program [phone])   Eligibility:  Age 75 y.o.  Pack Years Smoking History Calculation 90 pack year (# packs/per year x # years smoked)  Recent History of coughing up blood  no  Unexplained weight loss? no ( >Than 15 pounds within the last 6 months )  Prior History Lung / other cancer no (Diagnosis within the last 5 years already requiring surveillance chest CT Scans).  Smoking Status Former Smoker  Former Smokers: Years since quit:17  Quit Date: 2000  Visit Components:  Discussion included one or more decision making aids. NA: Patient does not qualify for program, quit 17 years ago.  Discussion included risk/benefits of screening. NA  Discussion included potential follow up diagnostic testing for abnormal scans. NA  Discussion included meaning and risk of over diagnosis. NA  Discussion included meaning and risk of False Positives.NA  Discussion included meaning of total radiation exposure. NA  Counseling Included:  Importance of adherence to annual lung cancer LDCT screening. NA, does not qualify  Impact of comorbidities on ability to participate in the program. NA  Ability and willingness to under diagnostic treatment. NA  Smoking Cessation Counseling:  Current Smokers:   Discussed importance of smoking cessation.NA  Information about tobacco cessation classes and interventions provided to patient. NA  Patient provided with "ticket" for LDCT Scan NA  Symptomatic Patient. NA  Counseling  Diagnosis Code: Tobacco Use Z72.0  Asymptomatic Patient NA  Counseling NA  Former Smokers:   Discussed the importance of maintaining cigarette abstinence.NA  Diagnosis Code: Personal History of Nicotine Dependence. [id number]  Information about tobacco cessation classes and interventions provided to patient. NA  Patient provided with "ticket" for LDCT Scan.NA  Written Order for Lung Cancer Screening with LDCT  placed in Epic. NA (CT Chest Lung Cancer Screening Low Dose W/O CM) [id number] Z12.2-Screening of respiratory organs Z87.891-Personal history of nicotine dependence  Upon initiating the Shared decision making visit it was determined that [name] [name] did not qualify for the lung cancer screening program due to the fact that he has been a former smoker for greater than 15 years. I explained this to him, we canceled the CT scan was scheduled for today, and I will let Dr. Ashok Cordia who referred the patient know that he does not qualify for the program.  Magdalen Spatz, NP  11/30/15

## 2016-02-19 ENCOUNTER — Ambulatory Visit (INDEPENDENT_AMBULATORY_CARE_PROVIDER_SITE_OTHER): Payer: Medicare Other | Admitting: Pulmonary Disease

## 2016-02-19 ENCOUNTER — Encounter: Payer: Self-pay | Admitting: Pulmonary Disease

## 2016-02-19 VITALS — BP 132/62 | HR 88 | Ht 70.0 in | Wt 167.0 lb

## 2016-02-19 DIAGNOSIS — J449 Chronic obstructive pulmonary disease, unspecified: Secondary | ICD-10-CM | POA: Diagnosis not present

## 2016-02-19 DIAGNOSIS — J439 Emphysema, unspecified: Secondary | ICD-10-CM | POA: Insufficient documentation

## 2016-02-19 DIAGNOSIS — R0600 Dyspnea, unspecified: Secondary | ICD-10-CM

## 2016-02-19 LAB — PULMONARY FUNCTION TEST
DL/VA % pred: 70 %
DL/VA: 3.13 ml/min/mmHg/L
DLCO COR: 17.98 ml/min/mmHg
DLCO UNC % PRED: 60 %
DLCO cor % pred: 60 %
DLCO unc: 17.88 ml/min/mmHg
FEF 25-75 PRE: 0.48 L/s
FEF 25-75 Post: 0.49 L/sec
FEF2575-%CHANGE-POST: 2 %
FEF2575-%Pred-Post: 23 %
FEF2575-%Pred-Pre: 23 %
FEV1-%Change-Post: 1 %
FEV1-%PRED-PRE: 46 %
FEV1-%Pred-Post: 47 %
FEV1-POST: 1.33 L
FEV1-Pre: 1.32 L
FEV1FVC-%CHANGE-POST: 0 %
FEV1FVC-%PRED-PRE: 68 %
FEV6-%CHANGE-POST: -1 %
FEV6-%PRED-PRE: 69 %
FEV6-%Pred-Post: 68 %
FEV6-Post: 2.52 L
FEV6-Pre: 2.55 L
FEV6FVC-%Change-Post: -1 %
FEV6FVC-%PRED-POST: 101 %
FEV6FVC-%Pred-Pre: 103 %
FVC-%Change-Post: 0 %
FVC-%Pred-Post: 67 %
FVC-%Pred-Pre: 67 %
FVC-Post: 2.66 L
FVC-Pre: 2.65 L
POST FEV1/FVC RATIO: 50 %
POST FEV6/FVC RATIO: 95 %
PRE FEV1/FVC RATIO: 50 %
Pre FEV6/FVC Ratio: 96 %

## 2016-02-19 NOTE — Progress Notes (Signed)
PFT done today. 

## 2016-02-19 NOTE — Patient Instructions (Signed)
   Continue using your Spiriva as prescribed.  Try stopping your Breo. If you feel like your breathing is worse or you develop any new cough or wheezing restart the [facility] & call my office to let us know.  I will see you back in 3 months with breathing tests or sooner if needed.  TESTS ORDERED: 1. CT chest without contrast 2. Spirometry with bronchodilator challenge at next appointment

## 2016-02-19 NOTE — Progress Notes (Signed)
Subjective:    Patient ID: [name] [name], male    DOB: 06-20-1941, 75 y.o.   MRN: [id number]  C.C.:  Follow-up for Severe COPD.  HPI Severe COPD:  Breo discontinued at last appointment & patient continued on Spiriva. However, patient reports he has continued to take Spiriva but is also taking Breo as well. Reports he did have an exacerbation that triggered an E.D. Visit in February. Denies any change in his baseline dyspnea on exertion. Denies any coughing or wheezing.  Review of Systems Denies any chest pain or tightness. No fever, chills, or sweats.   No Known Allergies  Current Outpatient Prescriptions on File Prior to Visit  Medication Sig Dispense Refill  . albuterol (PROAIR HFA) 108 (90 Base) MCG/ACT inhaler Inhale 2 puffs into the lungs every 6 (six) hours as needed for wheezing or shortness of breath. 18 g 3  . Ascorbic Acid (VITAMIN C) 500 MG tablet Take 500 mg by mouth 2 (two) times a week. Couple times weekly    . aspirin 81 MG tablet Take 81 mg by mouth 2 (two) times a week. Twice weekly    . diltiazem (DILACOR XR) 120 MG 24 hr capsule Take 2 tabs in the am, and 1 tab in the pm (Patient taking differently: Take 120-240 mg by mouth 2 (two) times daily. Take 2 tabs in the am, and 1 tab in the pm) 180 capsule 3  . LYRICA 150 MG capsule TAKE ONE CAPSULE BY MOUTH THREE TIMES DAILY 90 capsule 0  . Multiple Vitamin (MULTIVITAMIN) capsule Take 1 capsule by mouth 2 (two) times a week. Reported on 02/19/2016    . Oxycodone HCl 10 MG TABS Take 1 mg by mouth every 6 (six) hours as needed (pain).     . SPIRIVA RESPIMAT 2.5 MCG/ACT AERS INHALE TWO SPRAY(S) BY MOUTH ONCE DAILY 4 g 10   No current facility-administered medications on file prior to visit.    Past Medical History  Diagnosis Date  . Mixed hyperlipidemia   . Palpitations   . DJD (degenerative joint disease), lumbar   . Peripheral neuropathy ([place])   . History of prostate cancer     Status post radioactive seed implants  .  PAF (paroxysmal atrial fibrillation) ([place])   . Urethral stricture   . COPD with emphysema ([place])     Dr. Joya Gaskins, Gold stage C  . Incomplete emptying of bladder   . Wears partial dentures     Upper and lower  . Sciatic nerve pain     Secondary to shingles 2011  . History of shingles     Past Surgical History  Procedure Laterality Date  . Cystoscopy with urethral dilatation  09/15/2011    Procedure: CYSTOSCOPY WITH URETHRAL DILATATION;  Surgeon: Bernestine Amass, MD;  Location: [facility];  Service: Urology;  Laterality: N/A;  CYSTOSCOPY, BALLOON DILATION   . Cataract extraction w/ intraocular lens  implant, bilateral  2012  . Radioactive prostate seed implants/  cysto with balloon dilation bladder neck contracture  11-26-2006  . Cysto/  balloon dilation and incision bladder neck contracture  X5  LAST ONE 01-24-2009  . Colonoscopy  02-14-2003  . Cystoscopy with urethral dilatation N/A 04/03/2014    Procedure: CYSTOSCOPY WITH dilatation;  Surgeon: Bernestine Amass, MD;  Location: [facility];  Service: Urology;  Laterality: N/A;    Family History  Problem Relation Age of Onset  . COPD Mother   . Heart disease Brother  Social History   Social History  . Marital Status: Married    Spouse Name: [name]  . Number of Children: 3  . Years of Education: 9 th   Occupational History  .      Retired   Social History Main Topics  . Smoking status: Former Smoker -- 2.00 packs/day for 40 years    Types: Cigarettes    Start date: 06/19/1954    Quit date: 09/15/1998  . Smokeless tobacco: Never Used     Comment: Counseled to remain smoke free  . Alcohol Use: No  . Drug Use: No  . Sexual Activity: Not Asked   Other Topics Concern  . None   Social History Narrative   Patient lives at home with [name]). Patient is retired and has three daughters and one step daughter. Patient has a 9th grade education.   Right handed.   Caffeine- three  cups daily.       [place] [facility]:   Originally from [place], [name]. Always lived in [name]. 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 No bird, mold, or asbestos exposure. 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       Objective:   Physical Exam BP 132/62 mmHg  Pulse 88  Ht 5\' 10"  (1.778 m)  Wt 167 lb (75.751 kg)  BMI 23.96 kg/m2  SpO2 93% General:  Awake. Alert. No distress.  Integument:  Warm & dry. No rash on exposed skin.  HEENT:  Moist mucus membranes. No oral ulcers. No scleral injection.  Cardiovascular:  Regular rate. No edema. No appreciable JVD.  Pulmonary:  Clear bilaterally to auscultation. Symmetrically decreased breath sounds. Normal work of breathing on room air. Abdomen: Soft. Normal bowel sounds. Nontender. Musculoskeletal:  Normal bulk and tone. No joint deformity or effusion appreciated.  PFT 02/19/16: FVC 2.65 L (67%) FEV1 1.32 L (46%) FEV1/FVC 0.50 FEF 25-75 0.48 L (23%) no bronchodilator response TLC 5.72 L (86%) RV 103% ERV 75% DLCO corrected 60% (hemoglobin 14.4) 03/03/14: FVC 2.47 L (61%) FEV1 1.25 L (42%) FEV1/FVC 0.50 FEF 25-75 0.44 L (20%) no bronchodilator response 07/17/14: FVC 2.76 L (69%) FEV1 1.41 L (48%) FEV1/FVC 0.51 FEF 25-75 0.52 L (24%) no bronchodilator response  6MWT 02/18/16: Walked 347 meters / Baseline Sat 94% on RA / Nadir Sat 93% on RA  IMAGING CXR PA/LAT 11/09/15 (personally reviewed by me): No focal opacity or pleural effusion appreciated. Linear scarring noted. Heart normal in size & mediastinum normal in contour.  LABS 10/29/15 Alpha-1 antitrypsin: MM (143)  04/25/14 ABG on RA:  7.42 / 36 / 68.7    Assessment & Plan:  75 year old male with severe COPD. Spirometry remains stable. Patient does have a mild reduction in his carbon monoxide diffusion capacity consistent with his underlying emphysema. Despite this the patient has no evidence of significant desaturation  or oxygen requirement on his 6 minute walk test today. I personally reviewed his prior CT imaging which did show apical predominant emphysema. We also had a lengthy discussion regarding potential additional treatment options for the patient's underlying emphysema which would include lung volume reduction surgery. However, the patient had no evidence of bullous changes on previous imaging. We again discussed stem cell treatment but I again cautioned the patient that there is limited data available on this for COPD.  1. Severe COPD: Continuing Spiriva. Patient to try discontinuing Breo but will resume for any new symptoms.  Repeat spirometry with bronchodilator challenge at next appointment to ensure maximal bronchodilatation. 2. Pulmonary emphysema: Checking CT chest without contrast to determine whether or not patient may be a good candidate for lung volume reduction surgery. 3. Health maintenance: Received Prevnar vaccine 05/15/14, Influenza Vaccine 10/29/15, & Pneumovax 05/18/13. 4. Follow-up: Patient to return to clinic in 3 months or sooner if needed.  Sonia Baller Ashok Cordia, M.D. [facility] Pulmonary & Critical Care Pager:  [phone] After 3pm or if no response, call [phone] 1:36 PM 02/19/2016

## 2016-02-19 NOTE — Progress Notes (Signed)
6MWT 02/18/16:  Walked 347 meters / Baseline Sat 94% on RA / Nadir Sat 93% on RA  LABS 10/29/15 Alpha-1 antitrypsin: MM (143)

## 2016-03-20 ENCOUNTER — Other Ambulatory Visit: Payer: Self-pay | Admitting: Cardiology

## 2016-04-19 ENCOUNTER — Other Ambulatory Visit: Payer: Self-pay | Admitting: Critical Care Medicine

## 2016-04-21 ENCOUNTER — Other Ambulatory Visit: Payer: Self-pay | Admitting: *Deleted

## 2016-04-21 MED ORDER — TIOTROPIUM BROMIDE MONOHYDRATE 2.5 MCG/ACT IN AERS: INHALATION_SPRAY | RESPIRATORY_TRACT | 3 refills | Status: DC

## 2016-05-14 ENCOUNTER — Ambulatory Visit (HOSPITAL_COMMUNITY): Payer: Medicare Other

## 2016-05-14 ENCOUNTER — Ambulatory Visit (HOSPITAL_COMMUNITY)
Admission: RE | Admit: 2016-05-14 | Discharge: 2016-05-14 | Disposition: A | Discharge status: Home / Self Care | Payer: Medicare Other | Source: Ambulatory Visit | Attending: Pulmonary Disease | Admitting: Pulmonary Disease

## 2016-05-14 DIAGNOSIS — J432 Centrilobular emphysema: Secondary | ICD-10-CM | POA: Insufficient documentation

## 2016-05-14 DIAGNOSIS — R918 Other nonspecific abnormal finding of lung field: Secondary | ICD-10-CM | POA: Insufficient documentation

## 2016-05-14 DIAGNOSIS — J449 Chronic obstructive pulmonary disease, unspecified: Secondary | ICD-10-CM

## 2016-05-14 DIAGNOSIS — J439 Emphysema, unspecified: Secondary | ICD-10-CM | POA: Diagnosis present

## 2016-05-21 ENCOUNTER — Ambulatory Visit (INDEPENDENT_AMBULATORY_CARE_PROVIDER_SITE_OTHER): Payer: Medicare Other | Admitting: Pulmonary Disease

## 2016-05-21 ENCOUNTER — Encounter: Payer: Self-pay | Admitting: Pulmonary Disease

## 2016-05-21 VITALS — BP 124/66 | HR 80 | Ht 68.0 in | Wt 167.0 lb

## 2016-05-21 DIAGNOSIS — J439 Emphysema, unspecified: Secondary | ICD-10-CM

## 2016-05-21 DIAGNOSIS — R918 Other nonspecific abnormal finding of lung field: Secondary | ICD-10-CM

## 2016-05-21 DIAGNOSIS — Z23 Encounter for immunization: Secondary | ICD-10-CM

## 2016-05-21 DIAGNOSIS — J449 Chronic obstructive pulmonary disease, unspecified: Secondary | ICD-10-CM | POA: Diagnosis not present

## 2016-05-21 LAB — PULMONARY FUNCTION TEST
FEF 25-75 POST: 0.59 L/s
FEF 25-75 Pre: 0.47 L/sec
FEF2575-%Change-Post: 25 %
FEF2575-%PRED-POST: 29 %
FEF2575-%Pred-Pre: 23 %
FEV1-%CHANGE-POST: 8 %
FEV1-%Pred-Post: 50 %
FEV1-%Pred-Pre: 46 %
FEV1-Post: 1.41 L
FEV1-Pre: 1.3 L
FEV1FVC-%Change-Post: 1 %
FEV1FVC-%PRED-PRE: 67 %
FEV6-%Change-Post: 6 %
FEV6-%Pred-Post: 73 %
FEV6-%Pred-Pre: 69 %
FEV6-POST: 2.69 L
FEV6-PRE: 2.53 L
FEV6FVC-%Change-Post: 0 %
FEV6FVC-%PRED-POST: 102 %
FEV6FVC-%PRED-PRE: 103 %
FVC-%CHANGE-POST: 7 %
FVC-%PRED-POST: 72 %
FVC-%PRED-PRE: 67 %
FVC-POST: 2.83 L
FVC-PRE: 2.64 L
PRE FEV6/FVC RATIO: 96 %
Post FEV1/FVC ratio: 50 %
Post FEV6/FVC ratio: 95 %
Pre FEV1/FVC ratio: 49 %

## 2016-05-21 MED ORDER — UMECLIDINIUM-VILANTEROL 62.5-25 MCG/INH IN AEPB: 1.0000 | INHALATION_SPRAY | Freq: Every day | RESPIRATORY_TRACT | 0 refills | Status: DC

## 2016-05-21 NOTE — Progress Notes (Signed)
Subjective:    Patient ID: [name] [name], male    DOB: 1941-02-07, 75 y.o.   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  HPI Severe COPD w/ Emphysema:  On Spiriva. He reports he previously was using his rescue inhaler more frequently on Spiriva alone. As such he went back on Breo & feels he is using his rescue inhaler less frequently. Denies any coughing or wheezing. Denies any nocturnal awakenings with any breathing problems. No exacerbations since last appointment. He reports his baseline dyspnea is unchanged. He is able to do his household chores. He does report some fluctuation in his breathing depending on the weather & humidity.   Bilateral Pulmonary Nodules:  Has a h/o nodules going back to 2006. Has a new RUL nodule on 05/14/16 CT scan.   Review of Systems No subjective fever, chills, or sweats. No chest pain or pressure. No nausea or vomiting.  No Known Allergies  Current Outpatient Prescriptions on File Prior to Visit  Medication Sig Dispense Refill  . albuterol (PROAIR HFA) 108 (90 Base) MCG/ACT inhaler Inhale 2 puffs into the lungs every 6 (six) hours as needed for wheezing or shortness of breath. 18 g 3  . Ascorbic Acid (VITAMIN C) 500 MG tablet Take 500 mg by mouth 2 (two) times a week. Couple times weekly    . aspirin 81 MG tablet Take 81 mg by mouth 2 (two) times a week. Twice weekly    . DILT-XR 120 MG 24 hr capsule TAKE TWO CAPSULES BY MOUTH IN THE MORNING AND ONE CAPSULE IN THE EVENING -- **PLEASE KEEP UPCOMING APPOINTMENT FOR FUTURE REFILLS** 90 capsule 6  . diltiazem (DILACOR XR) 120 MG 24 hr capsule Take 2 tabs in the am, and 1 tab in the pm (Patient taking differently: Take 120-240 mg by mouth 2 (two) times daily. Take 2 tabs in the am, and 1 tab in the pm) 180 capsule 3  . LYRICA 150 MG capsule TAKE ONE CAPSULE BY MOUTH THREE TIMES DAILY 90 capsule 0  . Multiple Vitamin (MULTIVITAMIN) capsule Take 1 capsule by mouth 2  (two) times a week. Reported on 02/19/2016    . Oxycodone HCl 10 MG TABS Take 1 mg by mouth every 6 (six) hours as needed (pain).     . Tiotropium Bromide Monohydrate (SPIRIVA RESPIMAT) 2.5 MCG/ACT AERS 2 puffs once daily 4 g 3   No current facility-administered medications on file prior to visit.     Past Medical History:  Diagnosis Date  . COPD with emphysema ([place])    Dr. Joya Gaskins, Gold stage C  . DJD (degenerative joint disease), lumbar   . History of prostate cancer    Status post radioactive seed implants  . History of shingles   . Incomplete emptying of bladder   . Mixed hyperlipidemia   . PAF (paroxysmal atrial fibrillation) ([place])   . Palpitations   . Peripheral neuropathy ([place])   . Sciatic nerve pain    Secondary to shingles 2011  . Urethral stricture   . Wears partial dentures    Upper and lower    Past Surgical History:  Procedure Laterality Date  . CATARACT EXTRACTION W/ INTRAOCULAR LENS  IMPLANT, BILATERAL  2012  . COLONOSCOPY  02-14-2003  . CYSTO/  BALLOON DILATION AND INCISION BLADDER NECK CONTRACTURE  X5  LAST ONE 01-24-2009  . CYSTOSCOPY WITH URETHRAL DILATATION  09/15/2011   Procedure: CYSTOSCOPY WITH URETHRAL DILATATION;  Surgeon:  Bernestine Amass, MD;  Location: [facility];  Service: Urology;  Laterality: N/A;  CYSTOSCOPY, BALLOON DILATION   . CYSTOSCOPY WITH URETHRAL DILATATION N/A 04/03/2014   Procedure: CYSTOSCOPY WITH dilatation;  Surgeon: Bernestine Amass, MD;  Location: [facility];  Service: Urology;  Laterality: N/A;  . RADIOACTIVE PROSTATE SEED IMPLANTS/  CYSTO WITH BALLOON DILATION BLADDER NECK CONTRACTURE  11-26-2006    Family History  Problem Relation Age of Onset  . COPD Mother   . Heart disease Brother     Social History   Social History  . Marital status: Married    Spouse name: [name]  . Number of children: 3  . Years of education: 9 th   Occupational History  .  Retired    Retired   Social History Main  Topics  . Smoking status: Former Smoker    Packs/day: 2.00    Years: 40.00    Types: Cigarettes    Start date: 06/19/1954    Quit date: 09/15/1998  . Smokeless tobacco: Never Used     Comment: Counseled to remain smoke free  . Alcohol use No  . Drug use: No  . Sexual activity: Not Asked   Other Topics Concern  . None   Social History Narrative   Patient lives at home with [name]). Patient is retired and has three daughters and one step daughter. Patient has a 9th grade education.   Right handed.   Caffeine- three  cups daily.      [place] [facility]:   Originally from [place], [name]. Always lived in [name]. 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 No bird, mold, or asbestos exposure. 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       Objective:   Physical Exam BP 124/66 (BP Location: Right Arm, Patient Position: Sitting, Cuff Size: Normal)   Pulse 80   Ht 5\' 8"  (1.727 m)   Wt 167 lb (75.8 kg)   SpO2 97%   BMI 25.39 kg/m  General:  Awake. Alert. Comfortable. Integument:  Warm & dry. No rash on exposed skin.  HEENT:  Moist mucus membranes. No oral ulcers. No scleral icterus.  Cardiovascular:  Regular rate & rhythm. No edema.  Pulmonary:  Prolonged exhalation phase. Clear bilaterally to auscultation. Normal work of breathing. Abdomen: Soft. Normal bowel sounds. Nontender. Musculoskeletal:  Normal bulk and tone. No joint deformity or effusion appreciated.  PFT 05/21/16: FVC 2.64 L (67%) FEV1 1.30 L (46%) FEV1/FVC 0.49 FEF 25-75 0.47 L (23%) negative bronchodilator response 02/19/16: FVC 2.65 L (67%) FEV1 1.32 L (46%) FEV1/FVC 0.50 FEF 25-75 0.48 L (23%) negative bronchodilator response TLC 5.72 L (86%) RV 103% ERV 75% DLCO corrected 60% (hemoglobin 14.4) 03/03/14: FVC 2.47 L (61%) FEV1 1.25 L (42%) FEV1/FVC 0.50 FEF 25-75 0.44 L (20%) negative bronchodilator response 07/17/14: FVC 2.76 L (69%) FEV1  1.41 L (48%) FEV1/FVC 0.51 FEF 25-75 0.52 L (24%) negative bronchodilator response  6MWT 02/18/16: Walked 347 meters / Baseline Sat 94% on RA / Nadir Sat 93% on RA  IMAGING CT CHEST W/O 05/14/16 (personally reviewed by me): Rounded bilateral pulmonary nodules measuring up to 6 mm. RUL nodule measuring 62mm is new when compared with 2006 CT scan. No obvious new or developing nodule appreciated. No parenchymal mass or opacity appreciated. Upper lobe predominant centrilobular emphysema. No pathologic mediastinal adenopathy. No pleural effusion or thickening. No pericardial effusion.  CXR PA/LAT 11/09/15 (previously reviewed by me): No focal opacity or pleural effusion appreciated. Linear scarring noted. Heart normal in size & mediastinum normal in contour.  LABS 10/29/15 Alpha-1 antitrypsin: MM (143)  04/25/14 ABG on RA:  7.42 / 36 / 68.7    Assessment & Plan:  75 y.o. male with severe COPD w/ Emphysema. Reviewed patient's CT scan which does show lung nodules going back to 2006 on imaging. However, his 6 mm right upper lobe nodule appears new and is concerning for a possible early malignancy. Given his significant prior history of tobacco use I feel that repeat CT imaging in 3 months is reasonable according to guidelines. I instructed the patient contact my office if he had any new breathing problems or questions as he attempts to transition from his current inhaler regimen to Anoro. Hopefully this will be able to spare the patient excess inhaled corticosteroid therapy.  1. Severe COPD w/ Emphysema:  Giving Patient samples of Anoro to try in place of Breo and Spiriva. He will contact our office for prescription if this seems to be sufficient to control his underlying COPD. 2. Bilateral Lung Nodules:  Repeat CT chest w/o November 2017. 3. Health Maintenance: Received Prevnar vaccine 05/15/14, Influenza Vaccine 10/29/15, & Pneumovax 05/18/13. Administering High Dose Flu shot today. 4. Follow-up: Patient to  return to clinic in 3 months or sooner if needed.   Sonia Baller Ashok Cordia, M.D. [facility] Pulmonary & Critical Care Pager:  [phone] After 3pm or if no response, call [phone] 2:28 PM 05/21/16

## 2016-05-21 NOTE — Addendum Note (Signed)
Addended by: Inge Rise on: 05/21/2016 02:32 PM   Modules accepted: Orders

## 2016-05-21 NOTE — Patient Instructions (Addendum)
   Try using the Anoro inhaler in place of your Breo & Spiriva inhalers. Inhale once daily. If this seems to work and you aren't having to use your albuterol/rescue inhaler any more frequently after a week call my office and we'll get you a prescription.  We will repeat your chest CT scan in November.  I will see you back in November after your CT scan. Call me if you need to be seen sooner.  TESTS ORDERED: 1. CT Chest w/o November 2017

## 2016-05-21 NOTE — Progress Notes (Signed)
PFT 05/21/16: FVC 2.64 L (67%) FEV1 1.30 L (46%) FEV1/FVC 0.49 FEF 25-75 0.47 L (23%) negative bronchodilator response  IMAGING CT CHEST W/O 05/14/16 (personally reviewed by me): Rounded bilateral pulmonary nodules measuring up to 6 mm. No obvious new or developing nodule appreciated. No parenchymal mass or opacity appreciated. Upper lobe predominant centrilobular emphysema. No pathologic mediastinal adenopathy. No pleural effusion or thickening. No pericardial effusion.

## 2016-05-21 NOTE — Addendum Note (Signed)
Addended by: Inge Rise on: 05/21/2016 02:50 PM   Modules accepted: Orders

## 2016-05-22 ENCOUNTER — Other Ambulatory Visit: Payer: Self-pay | Admitting: Pulmonary Disease

## 2016-06-06 ENCOUNTER — Other Ambulatory Visit: Payer: Self-pay | Admitting: Pulmonary Disease

## 2016-06-10 ENCOUNTER — Telehealth: Payer: Self-pay | Admitting: Pulmonary Disease

## 2016-06-10 MED ORDER — FLUTICASONE FUROATE-VILANTEROL 100-25 MCG/INH IN AEPB: INHALATION_SPRAY | RESPIRATORY_TRACT | 5 refills | Status: DC

## 2016-06-10 NOTE — Telephone Encounter (Signed)
Patient states that the Anoro is not working at all.  He says that he had some Breo left over and he started back on the [place].  He says he has a week worth of the Breo left and would like a refill sent to his pharmacy.    Dr. Ashok Cordia, please advise if okay for patient to switch back to [facility] and ok for refill of Breo.

## 2016-06-10 NOTE — Telephone Encounter (Signed)
Spoke with pt and is aware of JN recs. Rx sent in. Nothing further needed

## 2016-06-10 NOTE — Telephone Encounter (Signed)
That's fine. Go ahead and send him in a Rx for the [employer] with 6 refills. Thanks.

## 2016-06-18 ENCOUNTER — Other Ambulatory Visit: Payer: Self-pay | Admitting: Critical Care Medicine

## 2016-07-24 ENCOUNTER — Ambulatory Visit (HOSPITAL_COMMUNITY)
Admission: RE | Admit: 2016-07-24 | Discharge: 2016-07-24 | Disposition: A | Discharge status: Home / Self Care | Payer: Medicare Other | Source: Ambulatory Visit | Attending: Pulmonary Disease | Admitting: Pulmonary Disease

## 2016-07-24 DIAGNOSIS — R918 Other nonspecific abnormal finding of lung field: Secondary | ICD-10-CM

## 2016-07-24 DIAGNOSIS — K8020 Calculus of gallbladder without cholecystitis without obstruction: Secondary | ICD-10-CM | POA: Insufficient documentation

## 2016-07-24 DIAGNOSIS — J439 Emphysema, unspecified: Secondary | ICD-10-CM | POA: Insufficient documentation

## 2016-07-24 DIAGNOSIS — J984 Other disorders of lung: Secondary | ICD-10-CM | POA: Insufficient documentation

## 2016-08-19 ENCOUNTER — Other Ambulatory Visit: Payer: Self-pay | Admitting: Pulmonary Disease

## 2016-08-25 ENCOUNTER — Encounter: Payer: Self-pay | Admitting: Pulmonary Disease

## 2016-08-25 ENCOUNTER — Ambulatory Visit (INDEPENDENT_AMBULATORY_CARE_PROVIDER_SITE_OTHER): Payer: Medicare Other | Admitting: Pulmonary Disease

## 2016-08-25 VITALS — BP 118/62 | HR 68 | Ht 68.0 in | Wt 167.0 lb

## 2016-08-25 DIAGNOSIS — J449 Chronic obstructive pulmonary disease, unspecified: Secondary | ICD-10-CM | POA: Diagnosis not present

## 2016-08-25 DIAGNOSIS — J439 Emphysema, unspecified: Secondary | ICD-10-CM

## 2016-08-25 DIAGNOSIS — R918 Other nonspecific abnormal finding of lung field: Secondary | ICD-10-CM | POA: Diagnosis not present

## 2016-08-25 MED ORDER — FLUTICASONE-UMECLIDIN-VILANT 100-62.5-25 MCG/INH IN AEPB: 1.0000 | INHALATION_SPRAY | Freq: Every day | RESPIRATORY_TRACT | 3 refills | Status: DC

## 2016-08-25 MED ORDER — FLUTICASONE-UMECLIDIN-VILANT 100-62.5-25 MCG/INH IN AEPB: 1.0000 | INHALATION_SPRAY | Freq: Every day | RESPIRATORY_TRACT | 0 refills | Status: DC

## 2016-08-25 NOTE — Patient Instructions (Signed)
   We are going to try you on the new Trelegy inhaler that will replace your Spiriva and Breo. Check on the cost of it before you try to fill it. Make sure you rinse, gargle and spit afterward because it does have a steroid in it and can cause thrush.  Call me if you have any questions or problems with your new inhaler.  I will see you back in 3 months after your CT scan or sooner if needed.  TESTS ORDERED: 1. CT Chest w/o February 2018

## 2016-08-25 NOTE — Progress Notes (Signed)
Subjective:    Patient ID: [name] [name], male    DOB: 1941-08-20, 75 y.o.   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  HPI Severe COPD w/ Emphysema:  Previously on only Spiriva. Patient given a sample of Anoro to try at last appointment in place of [place]. He reports he was having to use his rescue inhaler more frequently on Anoro. He reports he went back onto his [place] and hasn't needed his rescue inhaler as frequently. He denies any exacerbations since last appointment. He reports chronic, intermittent, nonproductive cough that is unchanged. No wheezing.   Bilateral Pulmonary Nodules:  Has a h/o nodules going back to 2006. Has a new RUL nodule on 05/14/16 CT scan. Repeat CT imaging in November shows no significant change.  Review of Systems No fever or chills. No sweats. No chest pain or pressure. No nausea, emesis, or diarrhea. He has had continued weight loss.   No Known Allergies  Current Outpatient Prescriptions on File Prior to Visit  Medication Sig Dispense Refill  . Ascorbic Acid (VITAMIN C) 500 MG tablet Take 500 mg by mouth 2 (two) times a week. Couple times weekly    . aspirin 81 MG tablet Take 81 mg by mouth 2 (two) times a week. Twice weekly    . DILT-XR 120 MG 24 hr capsule TAKE TWO CAPSULES BY MOUTH IN THE MORNING AND ONE CAPSULE IN THE EVENING -- **PLEASE KEEP UPCOMING APPOINTMENT FOR FUTURE REFILLS** 90 capsule 6  . diltiazem (DILACOR XR) 120 MG 24 hr capsule Take 2 tabs in the am, and 1 tab in the pm (Patient taking differently: Take 120-240 mg by mouth 2 (two) times daily. Take 2 tabs in the am, and 1 tab in the pm) 180 capsule 3  . fluticasone furoate-vilanterol (BREO ELLIPTA) 100-25 MCG/INH AEPB INHALE ONE PUFF BY MOUTH ONCE DAILY 60 each 5  . LYRICA 150 MG capsule TAKE ONE CAPSULE BY MOUTH THREE TIMES DAILY 90 capsule 0  . Multiple Vitamin (MULTIVITAMIN) capsule Take 1 capsule by mouth 2 (two) times a  week. Reported on 02/19/2016    . Oxycodone HCl 10 MG TABS Take 1 mg by mouth every 6 (six) hours as needed (pain).     Marland Kitchen PROAIR HFA 108 (90 Base) MCG/ACT inhaler INHALE TWO PUFFS BY MOUTH EVERY 6 HOURS AS NEEDED FOR WHEEZING OR SHORTNESS OF BREATH 9 each 7  . SPIRIVA RESPIMAT 2.5 MCG/ACT AERS INHALE TWO PUFFS BY MOUTH ONCE DAILY 1 Inhaler 3   No current facility-administered medications on file prior to visit.     Past Medical History:  Diagnosis Date  . COPD with emphysema ([place])    Dr. Joya Gaskins, Gold stage C  . DJD (degenerative joint disease), lumbar   . History of prostate cancer    Status post radioactive seed implants  . History of shingles   . Incomplete emptying of bladder   . Mixed hyperlipidemia   . PAF (paroxysmal atrial fibrillation) ([place])   . Palpitations   . Peripheral neuropathy ([place])   . Sciatic nerve pain    Secondary to shingles 2011  . Urethral stricture   . Wears partial dentures    Upper and lower    Past Surgical History:  Procedure Laterality Date  . CATARACT EXTRACTION W/ INTRAOCULAR LENS  IMPLANT, BILATERAL  2012  . COLONOSCOPY  02-14-2003  . CYSTO/  BALLOON DILATION AND INCISION BLADDER NECK CONTRACTURE  X5  LAST ONE 01-24-2009  . CYSTOSCOPY WITH URETHRAL DILATATION  09/15/2011   Procedure: CYSTOSCOPY WITH URETHRAL DILATATION;  Surgeon: Bernestine Amass, MD;  Location[facility];  Service: Urology;  Laterality: N/A;  CYSTOSCOPY, BALLOON DILATION   . CYSTOSCOPY WITH URETHRAL DILATATION N/A 04/03/2014   Procedure: CYSTOSCOPY WITH dilatation;  Surgeon: Bernestine Amass, MD;  Location: [facility];  Service: Urology;  Laterality: N/A;  . RADIOACTIVE PROSTATE SEED IMPLANTS/  CYSTO WITH BALLOON DILATION BLADDER NECK CONTRACTURE  11-26-2006    Family History  Problem Relation Age of Onset  . COPD Mother   . Heart disease Brother     Social History   Social History  . Marital status: Married    Spouse name: [name]  . Number  of children: 3  . Years of education: 9 th   Occupational History  .  Retired    Retired   Social History Main Topics  . Smoking status: Former Smoker    Packs/day: 2.00    Years: 40.00    Types: Cigarettes    Start date: 06/19/1954    Quit date: 09/15/1998  . Smokeless tobacco: Never Used     Comment: Counseled to remain smoke free  . Alcohol use No  . Drug use: No  . Sexual activity: Not Asked   Other Topics Concern  . None   Social History Narrative   Patient lives at home with [name]). Patient is retired and has three daughters and one step daughter. Patient has a 9th grade education.   Right handed.   Caffeine- three  cups daily.      [place] [facility]:   Originally from [place], [name]. Always lived in [name]. 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 No bird, mold, or asbestos exposure. 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       Objective:   Physical Exam BP 118/62 (BP Location: Left Arm, Cuff Size: Normal)   Pulse 68   Ht 5\' 8"  (1.727 m)   Wt 167 lb (75.8 kg)   SpO2 96%   BMI 25.39 kg/m  General:  Awake. No distress. Comfortable. Integument:  Warm & dry. No rash on exposed skin.  HEENT:  Moist mucus membranes. No oral ulcers. No scleral injection.  Cardiovascular:  Regular rate & rhythm. No edema. Normal S1 & S2. Pulmonary:  Symmetrically decreased breath sounds with prolonged exhalation phase. Speaking in complete sentences. Abdomen: Soft. Normal bowel sounds. Nondistended. Musculoskeletal:  Normal bulk and tone. No joint deformity or effusion appreciated.  PFT 05/21/16: FVC 2.64 L (67%) FEV1 1.30 L (46%) FEV1/FVC 0.49 FEF 25-75 0.47 L (23%) negative bronchodilator response 02/19/16: FVC 2.65 L (67%) FEV1 1.32 L (46%) FEV1/FVC 0.50 FEF 25-75 0.48 L (23%) negative bronchodilator response TLC 5.72 L (86%) RV 103% ERV 75% DLCO corrected 60% (hemoglobin 14.4) 03/03/14: FVC 2.47  L (61%) FEV1 1.25 L (42%) FEV1/FVC 0.50 FEF 25-75 0.44 L (20%) negative bronchodilator response 07/17/14: FVC 2.76 L (69%) FEV1 1.41 L (48%) FEV1/FVC 0.51 FEF 25-75 0.52 L (24%) negative bronchodilator response  6MWT 02/18/16: Walked 347 meters / Baseline Sat 94% on RA / Nadir Sat 93% on RA  IMAGING CT CHEST W/O 07/24/16 (personally reviewed by me):Multiple bilateral pulmonary nodules unchanged since 2007. Right upper lobe nodule/scarring unchanged from prior CT scan and measures 74mm still. Upper lobe predominant emphysematous changes noted. No pathologic  mediastinal adenopathy. No pleural effusion or thickening. No pericardial effusion. 11 calcifications noted consistent with prior granulomatous disease.  CT CHEST W/O 05/14/16 (previously reviewed by me): Rounded bilateral pulmonary nodules measuring up to 6 mm. RUL nodule measuring 78mm is new when compared with 2006 CT scan. No obvious new or developing nodule appreciated. No parenchymal mass or opacity appreciated. Upper lobe predominant centrilobular emphysema. No pathologic mediastinal adenopathy. No pleural effusion or thickening. No pericardial effusion.  CXR PA/LAT 11/09/15 (previously reviewed by me): No focal opacity or pleural effusion appreciated. Linear scarring noted. Heart normal in size & mediastinum normal in contour.  LABS 10/29/15 Alpha-1 antitrypsin: MM (143)  04/25/14 ABG on RA:  7.42 / 36 / 68.7    Assessment & Plan:  75 y.o. male with severe COPD w/ Emphysema and Bilateral Lung Nodules. Patient is still having used inhaled steroid therapy and seems to have significant worsening in his symptoms when attempting to remove steroid from his inhaler regimen. He may benefit from consolidating to a single inhaler to improve ease of use and compliance. We reviewed his chest CT scan which shows consistent continued stability in the linear opacity within his right upper lobe which does have a nodular component and could represent an  early malignancy given his history of tobacco use. All the other nodules seem to have remained stable and with his stomach calcifications likely represent prior granulomatous disease. I instructed the patient contact my office if he had any new breathing problems or questions before his next appointment.  1. Severe COPD w/ Emphysema:  Trying patient on Trelegy inhaler to see if it helps improve his symptom control. Providing patient a prescription to check on the cost to him. 2. Bilateral Lung Nodules:  Repeat CT chest w/o March 2018. 3. Health Maintenance: Received Prevnar vaccine August 2015, Influenza Vaccine September 2017, & Pneumovax September 2014.  4. Follow-up: Patient to return to clinic in 3 months or sooner if needed.  Sonia Baller Ashok Cordia, M.D. [facility] Pager:  [phone] After 3pm or if no response, call [phone] 9:18 AM 08/25/16

## 2016-08-26 NOTE — Progress Notes (Signed)
Cardiology Office Note  Date: 08/27/2016   ID: [name], [name] [name], MRN [id number]  PCP: Carlynn Herald, MD  Primary Cardiologist: Rozann Lesches, MD   Chief Complaint  Patient presents with  . Atrial Fibrillation    History of Present Illness: [name] [name] is a 75 y.o. male last seen in December 2016. He presents for a routine follow-up visit. Reports no major change since I saw him, no hospitalizations related to his lung disease. He remains chronically short of breath, not on oxygen supplementation however. He has NYHA class III symptoms.   Reports occasional sense of palpitations, nothing prolonged. No exertional chest pain. He continues on diltiazem XR.  CHADSVASC score is 45 (age, coronary calcifications by CT) at this point. Annual risk of stroke is approximately 3.4% on aspirin versus 1.1% on an agent such as Eliquis. We discussed this in detail today. He is concerned about bleeding risk on an anticoagulant and prefers to stay on aspirin.  Past Medical History:  Diagnosis Date  . COPD with emphysema ([place])    Dr. Joya Gaskins, Gold stage C  . DJD (degenerative joint disease), lumbar   . History of prostate cancer    Status post radioactive seed implants  . History of shingles   . Incomplete emptying of bladder   . Mixed hyperlipidemia   . PAF (paroxysmal atrial fibrillation) ([place])   . Palpitations   . Peripheral neuropathy ([place])   . Sciatic nerve pain    Secondary to shingles 2011  . Urethral stricture   . Wears partial dentures    Upper and lower    Current Outpatient Prescriptions  Medication Sig Dispense Refill  . aspirin 81 MG tablet Take 81 mg by mouth 2 (two) times a week. Twice weekly    . diltiazem (DILT-XR) 120 MG 24 hr capsule TAKE TWO CAPSULES BY MOUTH IN THE MORNING AND ONE CAPSULE IN THE EVENING -- **PLEASE KEEP UPCOMING APPOINTMENT FOR FUTURE REFILLS** 100 capsule 11  . fluticasone furoate-vilanterol (BREO ELLIPTA) 100-25 MCG/INH AEPB  INHALE ONE PUFF BY MOUTH ONCE DAILY 60 each 5  . Fluticasone-Umeclidin-Vilant (TRELEGY ELLIPTA) 100-62.5-25 MCG/INH AEPB Inhale 1 puff into the lungs daily. 60 each 3  . Fluticasone-Umeclidin-Vilant (TRELEGY ELLIPTA) 100-62.5-25 MCG/INH AEPB Inhale 1 puff into the lungs daily. 1 each 0  . LYRICA 150 MG capsule TAKE ONE CAPSULE BY MOUTH THREE TIMES DAILY 90 capsule 0  . Multiple Vitamin (MULTIVITAMIN) capsule Take 1 capsule by mouth 2 (two) times a week. Reported on 02/19/2016    . Oxycodone HCl 10 MG TABS Take 1 mg by mouth every 6 (six) hours as needed (pain).     Marland Kitchen PROAIR HFA 108 (90 Base) MCG/ACT inhaler INHALE TWO PUFFS BY MOUTH EVERY 6 HOURS AS NEEDED FOR WHEEZING OR SHORTNESS OF BREATH 9 each 7  . SPIRIVA RESPIMAT 2.5 MCG/ACT AERS INHALE TWO PUFFS BY MOUTH ONCE DAILY 1 Inhaler 3   No current facility-administered medications for this visit.    Allergies:  Patient has no known allergies.   Social History: The patient  reports that he quit smoking about 17 years ago. His smoking use included Cigarettes. He started smoking about 62 years ago. He has a 80.00 pack-year smoking history. He has never used smokeless tobacco. He reports that he does not drink alcohol or use drugs.   ROS:  Please see the history of present illness. Otherwise, complete review of systems is positive for fatigue.  All other systems are reviewed  and negative.   Physical Exam: VS:  BP 134/68   Pulse 88   Ht 5\' 10"  (1.778 m)   Wt 168 lb (76.2 kg)   SpO2 95%   BMI 24.11 kg/m , BMI Body mass index is 24.11 kg/m.  Wt Readings from Last 3 Encounters:  08/27/16 168 lb (76.2 kg)  08/25/16 167 lb (75.8 kg)  05/21/16 167 lb (75.8 kg)    Appears comfortable in no acute distress.  HEENT: Conjuctivae and lids normal, oropharynx clear.  Neck: Supple, no elevated JVP, no loud carotid bruits, no thyromegaly or tenderness.  Lungs: Nonlabored breathing at rest. Diminished course breath sounds but no wheezes.  Cor: PMI  nondisplaced. RRR. No pathologic systolic murmurs. No S3 or rub.  Abd: Soft, NTND. Normoactive bowel sounds.  Ext: No edema. Distal pulses 1+.  Skin: Warm and dry. Musculoskeletal: No kyphosis. Neuropsychiatric: Alert and oriented 3, affect appropriate.  ECG: I personally reviewed the tracing from 11/12/2015 which showed sinus tachycardia with R' in lead V1 and V2, nonspecific T-wave abnormalities.  Recent Labwork: 11/09/2015: BUN 9; Creatinine, Ser 1.29; Hemoglobin 14.9; Platelets 228; Potassium 3.5; Sodium 138   Other Studies Reviewed Today:  Chest CT 07/24/2016: IMPRESSION: 1. Numerous bilateral pulmonary nodules, unchanged since 2007 and considered benign. 2. Stable right upper lobe scarring changes and vague nodularity since the prior CT scan from 2017. 3. Advanced emphysematous changes. 4. No mediastinal or hilar mass or adenopathy. 5. Cholelithiasis.  Assessment and Plan:  1. Paroxysmal atrial fibrillation. CHADSVASC score is 3 at this point. We discussed stroke risk today and he would prefer to stay on aspirin at this time. Plan to continue diltiazem XR at our current dose.  2. COPD, continues to follow in the Pulmonary division. Remains on inhalers as outlined above. Denies any interval hospitalizations.  Current medicines were reviewed with the patient today.  Disposition: Follow-up in one year.  Signed, Satira Sark, MD, [facility] 08/27/2016 9:26 AM    [facility]. [address] [place], [place] [address] Phone: [phone]; Fax: [phone]

## 2016-08-27 ENCOUNTER — Ambulatory Visit (INDEPENDENT_AMBULATORY_CARE_PROVIDER_SITE_OTHER): Payer: Medicare Other | Admitting: Cardiology

## 2016-08-27 ENCOUNTER — Encounter: Payer: Self-pay | Admitting: Cardiology

## 2016-08-27 VITALS — BP 134/68 | HR 88 | Ht 70.0 in | Wt 168.0 lb

## 2016-08-27 DIAGNOSIS — I480 Paroxysmal atrial fibrillation: Secondary | ICD-10-CM

## 2016-08-27 DIAGNOSIS — J449 Chronic obstructive pulmonary disease, unspecified: Secondary | ICD-10-CM | POA: Diagnosis not present

## 2016-08-27 MED ORDER — DILTIAZEM HCL ER 120 MG PO CP24: ORAL_CAPSULE | ORAL | 11 refills | Status: DC

## 2016-08-27 NOTE — Patient Instructions (Signed)

## 2016-09-18 ENCOUNTER — Emergency Department (HOSPITAL_COMMUNITY)
Admission: EM | Admit: 2016-09-18 | Discharge: 2016-09-18 | Disposition: A | Discharge status: Home / Self Care | Payer: Medicare Other | Attending: Emergency Medicine | Admitting: Emergency Medicine

## 2016-09-18 ENCOUNTER — Emergency Department (HOSPITAL_COMMUNITY): Payer: Medicare Other

## 2016-09-18 ENCOUNTER — Encounter (HOSPITAL_COMMUNITY): Payer: Self-pay | Admitting: Emergency Medicine

## 2016-09-18 DIAGNOSIS — Z79899 Other long term (current) drug therapy: Secondary | ICD-10-CM | POA: Diagnosis not present

## 2016-09-18 DIAGNOSIS — J441 Chronic obstructive pulmonary disease with (acute) exacerbation: Secondary | ICD-10-CM | POA: Insufficient documentation

## 2016-09-18 DIAGNOSIS — Z7982 Long term (current) use of aspirin: Secondary | ICD-10-CM | POA: Diagnosis not present

## 2016-09-18 DIAGNOSIS — Z87891 Personal history of nicotine dependence: Secondary | ICD-10-CM | POA: Insufficient documentation

## 2016-09-18 DIAGNOSIS — R0602 Shortness of breath: Secondary | ICD-10-CM | POA: Diagnosis present

## 2016-09-18 LAB — CBC WITH DIFFERENTIAL/PLATELET
BASOS ABS: 0 10*3/uL (ref 0.0–0.1)
Basophils Relative: 0 %
EOS PCT: 3 %
Eosinophils Absolute: 0.2 10*3/uL (ref 0.0–0.7)
HEMATOCRIT: 42.5 % (ref 39.0–52.0)
Hemoglobin: 14.9 g/dL (ref 13.0–17.0)
LYMPHS PCT: 16 %
Lymphs Abs: 1.3 10*3/uL (ref 0.7–4.0)
MCH: 30.3 pg (ref 26.0–34.0)
MCHC: 35.1 g/dL (ref 30.0–36.0)
MCV: 86.6 fL (ref 78.0–100.0)
Monocytes Absolute: 0.9 10*3/uL (ref 0.1–1.0)
Monocytes Relative: 11 %
NEUTROS PCT: 70 %
Neutro Abs: 5.6 10*3/uL (ref 1.7–7.7)
Platelets: 285 10*3/uL (ref 150–400)
RBC: 4.91 MIL/uL (ref 4.22–5.81)
RDW: 13.6 % (ref 11.5–15.5)
WBC: 8.1 10*3/uL (ref 4.0–10.5)

## 2016-09-18 LAB — COMPREHENSIVE METABOLIC PANEL
ALT: 13 U/L — ABNORMAL LOW (ref 17–63)
ANION GAP: 10 (ref 5–15)
AST: 19 U/L (ref 15–41)
Albumin: 3.6 g/dL (ref 3.5–5.0)
Alkaline Phosphatase: 67 U/L (ref 38–126)
BILIRUBIN TOTAL: 1.5 mg/dL — AB (ref 0.3–1.2)
BUN: 11 mg/dL (ref 6–20)
CHLORIDE: 104 mmol/L (ref 101–111)
CO2: 23 mmol/L (ref 22–32)
Calcium: 9.2 mg/dL (ref 8.9–10.3)
Creatinine, Ser: 1.16 mg/dL (ref 0.61–1.24)
GFR, EST NON AFRICAN AMERICAN: 60 mL/min — AB (ref >60)
Glucose, Bld: 118 mg/dL — ABNORMAL HIGH (ref 65–99)
POTASSIUM: 3.1 mmol/L — AB (ref 3.5–5.1)
Sodium: 137 mmol/L (ref 135–145)
TOTAL PROTEIN: 7.6 g/dL (ref 6.5–8.1)

## 2016-09-18 LAB — BRAIN NATRIURETIC PEPTIDE: B NATRIURETIC PEPTIDE 5: 41 pg/mL (ref 0.0–100.0)

## 2016-09-18 LAB — TROPONIN I: Troponin I: <0.03 ng/mL (ref <0.03)

## 2016-09-18 MED ORDER — SODIUM CHLORIDE 0.9 % IV BOLUS (SEPSIS)
1000.0000 mL | Freq: Once | INTRAVENOUS | Status: AC
  Administered 2016-09-18: 1000 mL via INTRAVENOUS

## 2016-09-18 MED ORDER — SODIUM CHLORIDE 0.9 % IV BOLUS (SEPSIS)
1000.0000 mL | Freq: Once | INTRAVENOUS | Status: AC
Start: 2016-09-18 — End: 2016-09-18
  Administered 2016-09-18: 1000 mL via INTRAVENOUS

## 2016-09-18 MED ORDER — ALBUTEROL (5 MG/ML) CONTINUOUS INHALATION SOLN
10.0000 mg/h | INHALATION_SOLUTION | RESPIRATORY_TRACT | Status: AC
  Administered 2016-09-18: 10 mg/h via RESPIRATORY_TRACT
  Filled 2016-09-18: qty 20

## 2016-09-18 MED ORDER — METOPROLOL TARTRATE 5 MG/5ML IV SOLN: 5.0000 mg | Freq: Once | INTRAVENOUS | Status: DC

## 2016-09-18 MED ORDER — METHYLPREDNISOLONE SODIUM SUCC 125 MG IJ SOLR
125.0000 mg | Freq: Once | INTRAMUSCULAR | Status: AC
  Administered 2016-09-18: 125 mg via INTRAVENOUS
  Filled 2016-09-18: qty 2

## 2016-09-18 MED ORDER — KETOROLAC TROMETHAMINE 30 MG/ML IJ SOLN
30.0000 mg | Freq: Once | INTRAMUSCULAR | Status: AC
  Administered 2016-09-18: 30 mg via INTRAVENOUS
  Filled 2016-09-18: qty 1

## 2016-09-18 MED ORDER — LORAZEPAM 2 MG/ML IJ SOLN
0.5000 mg | Freq: Once | INTRAMUSCULAR | Status: AC
  Administered 2016-09-18: 0.5 mg via INTRAVENOUS
  Filled 2016-09-18: qty 1

## 2016-09-18 MED ORDER — IOPAMIDOL (ISOVUE-370) INJECTION 76%
100.0000 mL | Freq: Once | INTRAVENOUS | Status: AC | PRN
  Administered 2016-09-18: 100 mL via INTRAVENOUS

## 2016-09-18 MED ORDER — PREDNISONE 10 MG (21) PO TBPK: ORAL_TABLET | ORAL | 0 refills | Status: DC

## 2016-09-18 NOTE — ED Provider Notes (Signed)
Pt signed out from Dr. Sabra Heck.  Pt received an albuterol neb which caused his HR to elevate.  He received IVFs and hr was still high.  Due to the sob and the tachycardia, pt had a ct chest which showed no PE.  He is finally feeling better and feels well enough to go home.  He is instructed to f/u with pcp and to avoid albuterol.     Isla Pence, MD 09/18/16 1300

## 2016-09-18 NOTE — ED Notes (Signed)
Pt states his heart is racing too fast from breathing tx to walk at this time.  Dr. Gilford Raid notified and pt will inform nurse when ready to walk.

## 2016-09-18 NOTE — ED Provider Notes (Signed)
[facility] DEPT Provider Note   CSN: [id number] Arrival date & time: 09/18/16  0615     History   Chief Complaint Chief Complaint  Patient presents with  . Shortness of Breath    HPI [name] [name] is a 76 y.o. male.  HPI  The patient is a 76 year old male, he has a known history of COPD and has been diagnosed with emphysema since approximately year 2000. He also has a history of paroxysmal atrial fibrillation. He takes 2 different medications for COPD including Breo and Spiriva  but does not take albuterol as he states he feels like it makes his heart race and he does not like how it makes him feel.   he reports that over the last 10 days he has had a persistent shortness of breath which has been gradually worsening and he now feels like he is dyspneic nearly all the time. It gets worse with ambulation, it does not get worse with being in a supine position. He denies any swelling and has had no fevers. He has been coughing which has been a dry cough, there is no production, no runny nose or sore throat. The symptoms are gradually worsening. He recently took some prednisone that he had left over.  Past Medical History:  Diagnosis Date  . COPD with emphysema ([place])    Dr. Joya Gaskins, Gold stage C  . DJD (degenerative joint disease), lumbar   . History of prostate cancer    Status post radioactive seed implants  . History of shingles   . Incomplete emptying of bladder   . Mixed hyperlipidemia   . PAF (paroxysmal atrial fibrillation) ([place])   . Palpitations   . Peripheral neuropathy ([place])   . Sciatic nerve pain    Secondary to shingles 2011  . Urethral stricture   . Wears partial dentures    Upper and lower    Patient Active Problem List   Diagnosis Date Noted  . Multiple lung nodules on CT 05/14/2016  . COPD, severe ([place]) 02/19/2016  . Emphysema of lung ([place]) 02/19/2016  . Prostate cancer ([place])   . Mixed hyperlipidemia   . DJD (degenerative joint disease), lumbar   .  Neuropathy ([place])   . Back pain   . 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 PALPITATIONS 02/19/2009  . HYPERLIPIDEMIA 08/31/2007  . ATRIAL FIBRILLATION, PAROXYSMAL 08/31/2007  . COPD with chronic bronchitis Gold B  08/31/2007    Past Surgical History:  Procedure Laterality Date  . CATARACT EXTRACTION W/ INTRAOCULAR LENS  IMPLANT, BILATERAL  2012  . COLONOSCOPY  02-14-2003  . CYSTO/  BALLOON DILATION AND INCISION BLADDER NECK CONTRACTURE  X5  LAST ONE 01-24-2009  . CYSTOSCOPY WITH URETHRAL DILATATION  09/15/2011   Procedure: CYSTOSCOPY WITH URETHRAL DILATATION;  Surgeon: Bernestine Amass, MD;  [facility];  Service: Urology;  Laterality: N/A;  CYSTOSCOPY, BALLOON DILATION   . CYSTOSCOPY WITH URETHRAL DILATATION N/A 04/03/2014   Procedure: CYSTOSCOPY WITH dilatation;  Surgeon: Bernestine Amass, MD;  Location: [facility];  Service: Urology;  Laterality: N/A;  . RADIOACTIVE PROSTATE SEED IMPLANTS/  CYSTO WITH BALLOON DILATION BLADDER NECK CONTRACTURE  11-26-2006       Home Medications    Prior to Admission medications   Medication Sig Start Date End Date Taking? Authorizing Provider  aspirin 81 MG tablet Take 81 mg by mouth 2 (two) times a week. Twice weekly    Historical Provider, MD  diltiazem (DILT-XR) 120 MG 24  hr capsule TAKE TWO CAPSULES BY MOUTH IN THE MORNING AND ONE CAPSULE IN THE EVENING -- **PLEASE KEEP UPCOMING APPOINTMENT FOR FUTURE REFILLS** 08/27/16   Satira Sark, MD  fluticasone furoate-vilanterol (BREO ELLIPTA) 100-25 MCG/INH AEPB INHALE ONE PUFF BY MOUTH ONCE DAILY 06/10/16   Javier Glazier, MD  Fluticasone-Umeclidin-Vilant (TRELEGY ELLIPTA) 100-62.5-25 MCG/INH AEPB Inhale 1 puff into the lungs daily. 08/25/16   Javier Glazier, MD  Fluticasone-Umeclidin-Vilant (TRELEGY ELLIPTA) 100-62.5-25 MCG/INH AEPB Inhale 1 puff into the lungs daily. 08/25/16   Javier Glazier, MD  LYRICA 150 MG capsule TAKE ONE CAPSULE BY MOUTH THREE  TIMES DAILY 05/23/14   Marcial Pacas, MD  Multiple Vitamin (MULTIVITAMIN) capsule Take 1 capsule by mouth 2 (two) times a week. Reported on 02/19/2016    Historical Provider, MD  Oxycodone HCl 10 MG TABS Take 1 mg by mouth every 6 (six) hours as needed (pain).     Historical Provider, MD  PROAIR HFA 108 [phone] Base) MCG/ACT inhaler INHALE TWO PUFFS BY MOUTH EVERY 6 HOURS AS NEEDED FOR WHEEZING OR SHORTNESS OF BREATH 06/20/16   Javier Glazier, MD  SPIRIVA RESPIMAT 2.5 MCG/ACT AERS INHALE TWO PUFFS BY MOUTH ONCE DAILY 08/19/16   Javier Glazier, MD    Family History Family History  Problem Relation Age of Onset  . COPD Mother   . Heart disease Brother     Social History Social History  Substance Use Topics  . Smoking status: Former Smoker    Packs/day: 2.00    Years: 40.00    Types: Cigarettes    Start date: 06/19/1954    Quit date: 09/15/1998  . Smokeless tobacco: Never Used     Comment: Counseled to remain smoke free  . Alcohol use No     Allergies   Patient has no known allergies.   Review of Systems Review of Systems  All other systems reviewed and are negative.    Physical Exam Updated Vital Signs BP 135/74   Pulse 99   Temp 98.1 F (36.7 C)   Resp (!) 27   Ht 5\' 10"  (1.778 m)   Wt 175 lb (79.4 kg)   SpO2 96%   BMI 25.11 kg/m   Physical Exam  Constitutional: He appears well-developed and well-nourished. No distress.  HENT:  Head: Normocephalic and atraumatic.  Mouth/Throat: Oropharynx is clear and moist. No oropharyngeal exudate.  Eyes: Conjunctivae and EOM are normal. Pupils are equal, round, and reactive to light. Right eye exhibits no discharge. Left eye exhibits no discharge. No scleral icterus.  Neck: Normal range of motion. Neck supple. No JVD present. No thyromegaly present.  Cardiovascular: Normal rate, regular rhythm, normal heart sounds and intact distal pulses.  Exam reveals no gallop and no friction rub.   No murmur heard. Pulmonary/Chest: Effort  normal. No respiratory distress. He has no wheezes. He has no rales.  The patient has a normal work of breathing, he has decreased lung sounds bilaterally but no wheezing rales or accessory muscle use. He is able to speak in full sentences  Abdominal: Soft. Bowel sounds are normal. He exhibits no distension and no mass. There is no tenderness.  Musculoskeletal: Normal range of motion. He exhibits no edema or tenderness.  Lymphadenopathy:    He has no cervical adenopathy.  Neurological: He is alert. Coordination normal.  Skin: Skin is warm and dry. No rash noted. No erythema.  Psychiatric: He has a normal mood and affect. His behavior is normal.  Nursing  note and vitals reviewed.    ED Treatments / Results  Labs (all labs ordered are listed, but only abnormal results are displayed) Labs Reviewed  CULTURE, BLOOD (ROUTINE X 2)  CULTURE, BLOOD (ROUTINE X 2)  URINE CULTURE  COMPREHENSIVE METABOLIC PANEL  CBC WITH DIFFERENTIAL/PLATELET  URINALYSIS, ROUTINE W REFLEX MICROSCOPIC  TROPONIN I  BRAIN NATRIURETIC PEPTIDE  I-STAT CG4 LACTIC ACID, ED    EKG  EKG Interpretation  Date/Time:  Thursday September 18 2016 06:24:54 EST Ventricular Rate:  97 PR Interval:    QRS Duration: 80 QT Interval:  359 QTC Calculation: 456 R Axis:   69 Text Interpretation:  Sinus rhythm RSR' in V1 or V2, probably normal variant since last tracing no significant change Confirmed by Ryatt Corsino  MD, Veleria Barnhardt ([id number]) on 09/18/2016 7:01:03 AM       Radiology No results found.  Procedures Procedures (including critical care time)  Medications Ordered in ED Medications - No data to display   Initial Impression / Assessment and Plan / ED Course  I have reviewed the triage vital signs and the nursing notes.  Pertinent labs & imaging results that were available during my care of the patient were reviewed by me and considered in my medical decision making (see chart for details).  Clinical Course    I suspect  the patient's symptoms are more related to COPD than they are toother causes of shortness of breath as his EKG is unremarkable, his vital signs are also unremarkable and he is not tachycardic. His oxygen is approximately 94-96% on room air but due to his decreased lung sounds I suspect is severe COPD has crept up and caused his dyspnea. Chest x-ray and albuterol treatments pending, Solumedrol  Labs  Change of shift - care signed out to Dr. Gilford Raid  Final Clinical Impressions(s) / ED Diagnoses   Final diagnoses:  None    New Prescriptions New Prescriptions   No medications on file     Noemi Chapel, MD 09/18/16 [id number]

## 2016-09-18 NOTE — ED Triage Notes (Signed)
Pt c/o increased sob over the last couple of days.

## 2016-09-18 NOTE — ED Notes (Signed)
Ambulated Pt Pt spo2 was 97 and pulse was 78

## 2016-09-18 NOTE — ED Notes (Signed)
Pt taken to Ct by maryann.

## 2016-09-18 NOTE — ED Notes (Signed)
Notified respiratory of breathing tx due.

## 2016-09-29 ENCOUNTER — Telehealth: Payer: Self-pay | Admitting: Pulmonary Disease

## 2016-09-29 MED ORDER — PREDNISONE 10 MG PO TABS: ORAL_TABLET | ORAL | 0 refills | Status: DC

## 2016-09-29 NOTE — Telephone Encounter (Signed)
Offer prednisone 10 mg, # 20, 4 X 2 DAYS, 3 X 2 DAYS, 2 X 2 DAYS, 1 X 2 DAYS              Next available with Dr Ashok Cordia

## 2016-09-29 NOTE — Telephone Encounter (Signed)
CY  Please Advise- Sick Message   This is a JN pt. Pt. Last saw JN and was placed on Trelegy to replace his Spiriva and Breo but the pt. Felt it did not work for him and he started back on [place]. He is c/o increase sob, lots of congestion unable to get anything up when he coughs, chest tightness,wheezing, Denies fever. He says he is having to use his rescue inhaler more. He wants to know what should he do.

## 2016-09-29 NOTE — Telephone Encounter (Signed)
Called and spoke with pt and he is aware of CY recs and appt scheduled with JN on 2/6.  Pt is aware to seek emergency care if needed or if he needs to call back for sooner appt he may do that as well, but he is aware that it will not be with JN.

## 2016-10-21 ENCOUNTER — Ambulatory Visit (INDEPENDENT_AMBULATORY_CARE_PROVIDER_SITE_OTHER): Payer: Medicare Other | Admitting: Pulmonary Disease

## 2016-10-21 ENCOUNTER — Encounter: Payer: Self-pay | Admitting: Pulmonary Disease

## 2016-10-21 VITALS — BP 118/54 | HR 83 | Ht 70.0 in | Wt 159.6 lb

## 2016-10-21 DIAGNOSIS — J449 Chronic obstructive pulmonary disease, unspecified: Secondary | ICD-10-CM

## 2016-10-21 DIAGNOSIS — R918 Other nonspecific abnormal finding of lung field: Secondary | ICD-10-CM

## 2016-10-21 DIAGNOSIS — J9601 Acute respiratory failure with hypoxia: Secondary | ICD-10-CM | POA: Diagnosis not present

## 2016-10-21 DIAGNOSIS — J439 Emphysema, unspecified: Secondary | ICD-10-CM | POA: Diagnosis not present

## 2016-10-21 MED ORDER — ACAPELLA MISC: 0 refills | Status: DC

## 2016-10-21 MED ORDER — AEROCHAMBER MV MISC: 0 refills | Status: DC

## 2016-10-21 NOTE — Progress Notes (Signed)
Subjective:    Patient ID: [name] [name], male    DOB: [birth year], 76 y.o.   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  HPI Severe COPD w/ Emphysema:  Previously on Spiriva monotherapy and also was previously on Symbicort. Also previously trialed on Anoro & Breo. Given sample of Trelegy to try at last appointment. Patient reports he did have a mild flare of his COPD in early January. He felt his breathing was worse on the Trelegy. He feels like he is "congested" and cannot cough effectively. He reports only occasional wheezing. He reports intermittent coughing but reports only minimal mucus production. Patient reports he is having dyspnea with simply dressing himself and showering.   Bilateral Pulmonary Nodules:  Known history of pulmonary nodules going back to 2006. Right upper lobe nodule on CT scan in August 2017 appeared new and stable on repeat CT imaging in November 2017. Planned for repeat CT chest imaging in March 2018.  Review of Systems No fever, chills, or sweats. No chest pain or pressure. No abdominal pain or nausea.   No Known Allergies  Current Outpatient Prescriptions on File Prior to Visit  Medication Sig Dispense Refill  . aspirin 81 MG tablet Take 81 mg by mouth 2 (two) times a week. Twice weekly    . diltiazem (DILT-XR) 120 MG 24 hr capsule TAKE TWO CAPSULES BY MOUTH IN THE MORNING AND ONE CAPSULE IN THE EVENING -- **PLEASE KEEP UPCOMING APPOINTMENT FOR FUTURE REFILLS** 100 capsule 11  . fluticasone furoate-vilanterol (BREO ELLIPTA) 100-25 MCG/INH AEPB INHALE ONE PUFF BY MOUTH ONCE DAILY 60 each 5  . LYRICA 150 MG capsule TAKE ONE CAPSULE BY MOUTH THREE TIMES DAILY 90 capsule 0  . Oxycodone HCl 10 MG TABS Take 1 mg by mouth every 6 (six) hours as needed (pain).     Marland Kitchen PROAIR HFA 108 (90 Base) MCG/ACT inhaler INHALE TWO PUFFS BY MOUTH EVERY 6 HOURS AS NEEDED FOR WHEEZING OR SHORTNESS OF BREATH 9 each 7  . SPIRIVA RESPIMAT  2.5 MCG/ACT AERS INHALE TWO PUFFS BY MOUTH ONCE DAILY 1 Inhaler 3   No current facility-administered medications on file prior to visit.     Past Medical History:  Diagnosis Date  . COPD with emphysema ([place])    Dr. Joya Gaskins, Gold stage C  . DJD (degenerative joint disease), lumbar   . History of prostate cancer    Status post radioactive seed implants  . History of shingles   . Incomplete emptying of bladder   . Mixed hyperlipidemia   . PAF (paroxysmal atrial fibrillation) ([place])   . Palpitations   . Peripheral neuropathy ([place])   . Sciatic nerve pain    Secondary to shingles 2011  . Urethral stricture   . Wears partial dentures    Upper and lower    Past Surgical History:  Procedure Laterality Date  . CATARACT EXTRACTION W/ INTRAOCULAR LENS  IMPLANT, BILATERAL  2012  . COLONOSCOPY  02-14-2003  . CYSTO/  BALLOON DILATION AND INCISION BLADDER NECK CONTRACTURE  X5  LAST ONE 01-24-2009  . CYSTOSCOPY WITH URETHRAL DILATATION  09/15/2011   Procedure: CYSTOSCOPY WITH URETHRAL DILATATION;  Surgeon: Bernestine Amass, MD;  Location: [facility];  Service: Urology;  Laterality: N/A;  CYSTOSCOPY, BALLOON DILATION   . CYSTOSCOPY WITH URETHRAL DILATATION N/A 04/03/2014   Procedure: CYSTOSCOPY WITH dilatation;  Surgeon: Bernestine Amass, MD;  Location: [facility];  Service:  Urology;  Laterality: N/A;  . RADIOACTIVE PROSTATE SEED IMPLANTS/  CYSTO WITH BALLOON DILATION BLADDER NECK CONTRACTURE  11-26-2006    Family History  Problem Relation Age of Onset  . COPD Mother   . Heart disease Brother     Social History   Social History  . Marital status: Married    Spouse name: [name]  . Number of children: 3  . Years of education: 9 th   Occupational History  .  Retired    Retired   Social History Main Topics  . Smoking status: Former Smoker    Packs/day: 2.00    Years: 40.00    Types: Cigarettes    Start date: 06/19/1954    Quit date: 09/15/1998  .  Smokeless tobacco: Never Used     Comment: Counseled to remain smoke free  . Alcohol use No  . Drug use: No  . Sexual activity: Not Asked   Other Topics Concern  . None   Social History Narrative   Patient lives at home with [name]). Patient is retired and has three daughters and one step daughter. Patient has a 9th grade education.   Right handed.   Caffeine- three  cups daily.      [facility]:   Originally from [place], [name]. Always lived in [name]. 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 No bird, mold, or asbestos exposure. 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       Objective:   Physical Exam BP (!) 118/54 (BP Location: Left Arm, Cuff Size: Normal)   Pulse 83   Ht 5\' 10"  (1.778 m)   Wt 159 lb 9.6 oz (72.4 kg)   SpO2 92%   BMI 22.90 kg/m   General:  Awake. No distress. Thin, Caucasian male. Integument:  Warm & dry. No rash on exposed skin. Tattoo is noted. Extremities:  No cyanosis or clubbing.  HEENT:  Moist mucus membranes. No oral ulcers. No scleral injection or icterus. Minimal nasal turbinate swelling.  Cardiovascular:  Regular rate and rhythm. No edema. No appreciable JVD.  Pulmonary:  Symmetrically decreased breath sounds. No accessory muscle use on room air. Speaking in complete sentences. Abdomen: Soft. Normal bowel sounds. Nondistended.  Musculoskeletal:  Normal bulk and tone. No joint deformity or effusion appreciated.  PFT 05/21/16: FVC 2.64 L (67%) FEV1 1.30 L (46%) FEV1/FVC 0.49 FEF 25-75 0.47 L (23%) negative bronchodilator response 02/19/16: FVC 2.65 L (67%) FEV1 1.32 L (46%) FEV1/FVC 0.50 FEF 25-75 0.48 L (23%) negative bronchodilator response TLC 5.72 L (86%) RV 103% ERV 75% DLCO corrected 60% (hemoglobin 14.4) 03/03/14: FVC 2.47 L (61%) FEV1 1.25 L (42%) FEV1/FVC 0.50 FEF 25-75 0.44 L (20%) negative bronchodilator response 07/17/14: FVC 2.76 L (69%) FEV1 1.41 L  (48%) FEV1/FVC 0.51 FEF 25-75 0.52 L (24%) negative bronchodilator response  6MWT 10/21/16:  Walked 2 laps / Baseline Sat 92% on RA / Nadir Sat 87% (required 2 L/m to maintain saturation with walking) 02/18/16: Walked 347 meters / Baseline Sat 94% on RA / Nadir Sat 93% on RA  IMAGING CTA CHEST 09/18/16 (personally reviewed by me): No pulmonary emboli. No pathologic mediastinal adenopathy. No pleural effusion or thickening. No pericardial effusion. Apical predominant emphysematous changes again noted. Bilateral pulmonary nodules again noted without apparent significant enlargement.  CT CHEST W/O 07/24/16 (previously reviewed by me):Multiple bilateral pulmonary nodules unchanged since 2007. Right upper lobe nodule/scarring unchanged from  prior CT scan and measures 56mm still. Upper lobe predominant emphysematous changes noted. No pathologic mediastinal adenopathy. No pleural effusion or thickening. No pericardial effusion. 11 calcifications noted consistent with prior granulomatous disease.  CT CHEST W/O 05/14/16 (previously reviewed by me): Rounded bilateral pulmonary nodules measuring up to 6 mm. RUL nodule measuring 34mm is new when compared with 2006 CT scan. No obvious new or developing nodule appreciated. No parenchymal mass or opacity appreciated. Upper lobe predominant centrilobular emphysema. No pathologic mediastinal adenopathy. No pleural effusion or thickening. No pericardial effusion.  CXR PA/LAT 11/09/15 (previously reviewed by me): No focal opacity or pleural effusion appreciated. Linear scarring noted. Heart normal in size & mediastinum normal in contour.  LABS 10/29/15 Alpha-1 antitrypsin: MM (143)  04/25/14 ABG on RA:  7.42 / 36 / 68.7    Assessment & Plan:  76 y.o. male with severe COPD w/ Emphysema and Bilateral Lung Nodules. Symptomatically the patient is continuing to worsen with significant dyspnea with activities of daily living. I reviewed the patient's previous CT scan from  January which shows no parenchymal abnormality that would indicate a cause for worsening in his lung function other than some full normal physiologic decline with severe underlying disease. Patient today does demonstrate an oxygen requirement with ambulation and I feel this is secondary to simple worsening of his underlying COPD and emphysema but we will need to assess whether or not he has any anemia that could be contributing. I did caution the patient that his underlying disease process we'll continue to worsen as he ages. I instructed the patient to contact my office if he had any new breathing problems or questions before his next appointment.  1. Severe COPD w/ Emphysema:  Trying Symbicort with spacer in place of Breo. Continuing Spiriva. Referring patient to pulmonary rehabilitation at [facility].  2. Acute Hypoxic Respiratory Failure:  Prescribing the patient oxygen at 2 L/m with a portable concentrator. Repeating 6 minute walk test with oxygen titration at next appointment. Checking CBC today. 3. Bilateral Lung Nodules:  Continue current plan to repeat CT imaging in March of this year. 4. Health Maintenance:  S/P Prevnar vaccine August 2015, Influenza Vaccine September 2017, & Pneumovax September 2014.  5. Follow-up: Patient to return to clinic in 8 weeks or sooner if needed.   Sonia Baller Ashok Cordia, M.D. [facility] Pager:  [phone] After 3pm or if no response, call [phone] 11:29 AM 10/21/16

## 2016-10-21 NOTE — Progress Notes (Signed)
Patient seen in the office today and instructed on use of symbicort 160 and aerochamber.  Patient expressed understanding and demonstrated technique.  Jasmine,cma

## 2016-10-21 NOTE — Addendum Note (Signed)
Addended by: Benson Setting L on: 10/21/2016 11:56 AM   Modules accepted: Orders

## 2016-10-21 NOTE — Patient Instructions (Addendum)
   Continue using your Spiriva inhaler.  Try using the Symbicort inhaler - 2 puffs twice daily with your spacer - in place of your Breo inhaler. Call me if this seems to help with your shortness of breath and we will send in a prescription.  The Acapella/Flutter Valve we are giving you today should help you clear mucus from your lungs. Use it by blowing into it 3 times in a row 2-3 times daily.  I'm referring you to the Pulmonary Rehabilitation program at [facility].  I want you to use oxygen at 2 L/minute with exertion & walking.  I will see you back in 8 weeks with a walking test at that time to see how you are doing.  TESTS ORDERED: 1. 6MWT with oxygen titration at next appointment 2. CBC today

## 2016-10-21 NOTE — Addendum Note (Signed)
Addended by: Benson Setting L on: 10/21/2016 12:19 PM   Modules accepted: Orders

## 2016-10-29 ENCOUNTER — Telehealth: Payer: Self-pay | Admitting: Pulmonary Disease

## 2016-10-29 MED ORDER — BUDESONIDE-FORMOTEROL FUMARATE 160-4.5 MCG/ACT IN AERO: 2.0000 | INHALATION_SPRAY | Freq: Two times a day (BID) | RESPIRATORY_TRACT | 5 refills | Status: DC

## 2016-10-29 NOTE — Telephone Encounter (Signed)
Spoke with pt. And verified with me the dosage of his Symbicort and a rx was sent in to his pharmacy of choice. Nothing further is needed at this time.   2. Javier Glazier, MD (Physician) at 10/21/2016 11:39 AM - Signed     Continue using your Spiriva inhaler.  Try using the Symbicort inhaler - 2 puffs twice daily with your spacer - in place of your Breo inhaler. Call me if this seems to help with your shortness of breath and we will send in a prescription.  The Acapella/Flutter Valve we are giving you today should help you clear mucus from your lungs. Use it by blowing into it 3 times in a row 2-3 times daily.  I'm referring you to the Pulmonary Rehabilitation program at [facility].  I want you to use oxygen at 2 L/minute with exertion & walking.  I will see you back in 8 weeks with a walking test at that time to see how you are doing.  TESTS ORDERED: 1. 6MWT with oxygen titration at next appointment 2. CBC today

## 2016-11-03 ENCOUNTER — Other Ambulatory Visit: Payer: Self-pay | Admitting: Pulmonary Disease

## 2016-11-03 DIAGNOSIS — R918 Other nonspecific abnormal finding of lung field: Secondary | ICD-10-CM

## 2016-11-25 ENCOUNTER — Ambulatory Visit: Payer: Medicare Other | Admitting: Pulmonary Disease

## 2016-12-05 ENCOUNTER — Telehealth: Payer: Self-pay | Admitting: Pulmonary Disease

## 2016-12-05 NOTE — Telephone Encounter (Signed)
Spoke with Rodena Piety she states she just got the fax today. Left a message to Laquita to inform her of this information and that Dr. Ashok Cordia will not be in the office again until Monday.

## 2016-12-08 ENCOUNTER — Ambulatory Visit (HOSPITAL_COMMUNITY)
Admission: RE | Admit: 2016-12-08 | Discharge: 2016-12-08 | Disposition: A | Discharge status: Home / Self Care | Payer: Medicare Other | Source: Ambulatory Visit | Attending: Pulmonary Disease | Admitting: Pulmonary Disease

## 2016-12-08 DIAGNOSIS — J439 Emphysema, unspecified: Secondary | ICD-10-CM | POA: Insufficient documentation

## 2016-12-08 DIAGNOSIS — R918 Other nonspecific abnormal finding of lung field: Secondary | ICD-10-CM | POA: Diagnosis present

## 2016-12-08 DIAGNOSIS — I700 Atherosclerosis of aorta: Secondary | ICD-10-CM | POA: Insufficient documentation

## 2016-12-08 DIAGNOSIS — I2510 Atherosclerotic heart disease of native coronary artery without angina pectoris: Secondary | ICD-10-CM | POA: Insufficient documentation

## 2016-12-08 NOTE — Telephone Encounter (Signed)
Rodena Piety please advise once this form is completed by JN.  thanks

## 2016-12-09 NOTE — Telephone Encounter (Signed)
This form was signed by Dr. Ashok Cordia 12/08/2016 and was faxed to [facility] this morning 12/09/2016

## 2016-12-18 ENCOUNTER — Encounter (HOSPITAL_COMMUNITY): Payer: Medicare Other

## 2016-12-19 ENCOUNTER — Other Ambulatory Visit: Payer: Self-pay | Admitting: Pulmonary Disease

## 2016-12-22 ENCOUNTER — Ambulatory Visit: Payer: Medicare Other

## 2016-12-22 ENCOUNTER — Ambulatory Visit: Payer: Medicare Other | Admitting: Pulmonary Disease

## 2016-12-24 ENCOUNTER — Encounter: Payer: Self-pay | Admitting: Pulmonary Disease

## 2016-12-24 ENCOUNTER — Ambulatory Visit (INDEPENDENT_AMBULATORY_CARE_PROVIDER_SITE_OTHER): Payer: Medicare Other | Admitting: Pulmonary Disease

## 2016-12-24 VITALS — BP 90/58 | HR 68 | Ht 70.0 in | Wt 161.0 lb

## 2016-12-24 DIAGNOSIS — R918 Other nonspecific abnormal finding of lung field: Secondary | ICD-10-CM | POA: Diagnosis not present

## 2016-12-24 DIAGNOSIS — J449 Chronic obstructive pulmonary disease, unspecified: Secondary | ICD-10-CM | POA: Diagnosis not present

## 2016-12-24 NOTE — Patient Instructions (Signed)
   We are going to keep you on your Spiriva Respimat and Symbicort.  Try using your Symbicort inhaler with your partials removed. Remember to rinse, gargle, & spit afterward. This should help your hoarse voice but if it doesn't then let me know.  I will see you back in 6 months after your CT scan or sooner if needed.   TESTS ORDERED: 1. CT CHEST W/O AUGUST 2018

## 2016-12-24 NOTE — Progress Notes (Signed)
Test reviewed.  

## 2016-12-24 NOTE — Progress Notes (Signed)
Subjective:    Patient ID: [name] [name], male    DOB: [birth year], 76 y.o.   MRN: [id number]  C.C.:  Follow-up for Severe COPD w/ Emphysema, Acute Hypoxic Respiratory Failure, & Bilateral Pulmonary Nodules.  HPI Severe COPD with emphysema: Patient referred to pulmonary rehabilitation at last appointment. Previously has tried Anoro, [employer], [employer]. Given sample of Symbicort to try at last appointment. Continued on Spiriva at last appointment. He does feel his breathing has improved on Symbicort. Reports an intermittent cough that is mostly nonproductive. Minimal wheezing. He reports he hasn't started with Pulmonary Rehab due to some feel problems. No exacerbations since last appointment. Uses his rescue medication maybe once daily.   Acute hypoxic respiratory failure:  Patient was noted to have an oxygen requirement of 2 L/m with exertion at last appointment. Prescribed oxygen with a portable concentrator at that time. Walk test today shows no evidence of oxygen requirement with exertion.  Bilateral pulmonary nodules: History of nodules going back to 2006. New nodule noted in right upper lobe on CT imaging in August 2017. CT imaging performed in March shows continued stability of lung nodules.  Review of Systems He has noticed a hoarseness to his voice. He does have some dry mouth that is chronic. No fever or chills. He denies any chest pain or pressure but does have tightness.   No Known Allergies  Current Outpatient Prescriptions on File Prior to Visit  Medication Sig Dispense Refill  . aspirin 81 MG tablet Take 81 mg by mouth 2 (two) times a week. Twice weekly    . budesonide-formoterol (SYMBICORT) 160-4.5 MCG/ACT inhaler Inhale 2 puffs into the lungs 2 (two) times daily. 1 Inhaler 5  . diltiazem (DILT-XR) 120 MG 24 hr capsule TAKE TWO CAPSULES BY MOUTH IN THE MORNING AND ONE CAPSULE IN THE EVENING -- **PLEASE KEEP UPCOMING APPOINTMENT FOR FUTURE REFILLS** 100 capsule 11  . LYRICA 150 MG  capsule TAKE ONE CAPSULE BY MOUTH THREE TIMES DAILY 90 capsule 0  . Misc. Devices (ACAPELLA) MISC Use as directed 1 each 0  . Oxycodone HCl 10 MG TABS Take 1 mg by mouth every 6 (six) hours as needed (pain).     Marland Kitchen PROAIR HFA 108 (90 Base) MCG/ACT inhaler INHALE TWO PUFFS BY MOUTH EVERY 6 HOURS AS NEEDED FOR WHEEZING OR SHORTNESS OF BREATH 9 each 7  . Spacer/Aero-Holding Chambers (AEROCHAMBER MV) inhaler Use as instructed 1 each 0  . SPIRIVA RESPIMAT 2.5 MCG/ACT AERS INHALE TWO PUFFS BY MOUTH ONCE DAILY 3 Inhaler 3  . fluticasone furoate-vilanterol (BREO ELLIPTA) 100-25 MCG/INH AEPB INHALE ONE PUFF BY MOUTH ONCE DAILY (Patient not taking: Reported on 12/24/2016) 60 each 5   No current facility-administered medications on file prior to visit.     Past Medical History:  Diagnosis Date  . COPD with emphysema ([place])    Dr. Joya Gaskins, Gold stage C  . DJD (degenerative joint disease), lumbar   . History of prostate cancer    Status post radioactive seed implants  . History of shingles   . Incomplete emptying of bladder   . Mixed hyperlipidemia   . PAF (paroxysmal atrial fibrillation) ([place])   . Palpitations   . Peripheral neuropathy ([place])   . Sciatic nerve pain    Secondary to shingles 2011  . Urethral stricture   . Wears partial dentures    Upper and lower    Past Surgical History:  Procedure Laterality Date  . CATARACT EXTRACTION W/ INTRAOCULAR LENS  IMPLANT, BILATERAL  2012  . COLONOSCOPY  02-14-2003  . CYSTO/  BALLOON DILATION AND INCISION BLADDER NECK CONTRACTURE  X5  LAST ONE 01-24-2009  . CYSTOSCOPY WITH URETHRAL DILATATION  09/15/2011   Procedure: CYSTOSCOPY WITH URETHRAL DILATATION;  Surgeon: Bernestine Amass, MD;  Location: [facility];  Service: Urology;  Laterality: N/A;  CYSTOSCOPY, BALLOON DILATION   . CYSTOSCOPY WITH URETHRAL DILATATION N/A 04/03/2014   Procedure: CYSTOSCOPY WITH dilatation;  Surgeon: Bernestine Amass, MD;  Location: [facility];   Service: Urology;  Laterality: N/A;  . RADIOACTIVE PROSTATE SEED IMPLANTS/  CYSTO WITH BALLOON DILATION BLADDER NECK CONTRACTURE  11-26-2006    Family History  Problem Relation Age of Onset  . COPD Mother   . Heart disease Brother     Social History   Social History  . Marital status: Married    Spouse name: [name]  . Number of children: 3  . Years of education: 9 th   Occupational History  .  Retired    Retired   Social History Main Topics  . Smoking status: Former Smoker    Packs/day: 2.00    Years: 40.00    Types: Cigarettes    Start date: 06/19/1954    Quit date: 09/15/1998  . Smokeless tobacco: Never Used     Comment: Counseled to remain smoke free  . Alcohol use No  . Drug use: No  . Sexual activity: Not Asked   Other Topics Concern  . None   Social History Narrative   Patient lives at home with [name]). Patient is retired and has three daughters and one step daughter. Patient has a 9th grade education.   Right handed.   Caffeine- three  cups daily.      [place]:   Originally from [place], [name]. Always lived in [name]. 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 No bird, mold, or asbestos exposure. 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       Objective:   Physical Exam BP (!) 90/58 (BP Location: Right Arm, Patient Position: Sitting, Cuff Size: Normal)   Pulse 68   Ht 5\' 10"  (1.778 m)   Wt 161 lb (73 kg)   SpO2 97%   BMI 23.10 kg/m   Gen.: Thin, Caucasian male. No acute distress. Awake. Integument: No rash or bruising on exposed skin. Warm and dry. HEENT: Minimal nasal turbinate swelling. No scleral icterus. No oral ulcers. Moist mucous membranes. Pulmonary: Symmetrically decreased breath sounds. Otherwise clear to auscultation. No accessory muscle use on room air. Abdomen: Soft. Nondistended. Normal bowel sounds. Cardiovascular: Regular rate. Regular rhythm. No  edema or JVD appreciated.  PFT 05/21/16: FVC 2.64 L (67%) FEV1 1.30 L (46%) FEV1/FVC 0.49 FEF 25-75 0.47 L (23%) negative bronchodilator response 02/19/16: FVC 2.65 L (67%) FEV1 1.32 L (46%) FEV1/FVC 0.50 FEF 25-75 0.48 L (23%) negative bronchodilator response TLC 5.72 L (86%) RV 103% ERV 75% DLCO corrected 60% (hemoglobin 14.4) 03/03/14: FVC 2.47 L (61%) FEV1 1.25 L (42%) FEV1/FVC 0.50 FEF 25-75 0.44 L (20%) negative bronchodilator response 07/17/14: FVC 2.76 L (69%) FEV1 1.41 L (48%) FEV1/FVC 0.51 FEF 25-75 0.52 L (24%) negative bronchodilator response  6MWT 12/24/16:  Walked 384 meters / Baseline Sat 100% on RA / nadir Sat 96% on RA 10/21/16:  Walked 2 laps / Baseline Sat 92% on RA / Nadir Sat 87% (required 2 L/m  to maintain saturation with walking) 02/18/16: Walked 347 meters / Baseline Sat 94% on RA / Nadir Sat 93% on RA  IMAGING CT CHEST W/O 12/08/16 (personally reviewed by me): Bilateral pulmonary nodules. 6 mm nodule within right upper lobe & 7 mm nodule within the left lower lobe unchanged compared with previous. No new nodule appreciated. Apical predominant emphysema noted. No pleural effusion or thickening. No pericardial effusion. No pathologic mediastinal adenopathy.  CTA CHEST 09/18/16 (previously reviewed by me): No pulmonary emboli. No pathologic mediastinal adenopathy. No pleural effusion or thickening. No pericardial effusion. Apical predominant emphysematous changes again noted. Bilateral pulmonary nodules again noted without apparent significant enlargement.  CT CHEST W/O 07/24/16 (previously reviewed by me):Multiple bilateral pulmonary nodules unchanged since 2007. Right upper lobe nodule/scarring unchanged from prior CT scan and measures 63mm still. Upper lobe predominant emphysematous changes noted. No pathologic mediastinal adenopathy. No pleural effusion or thickening. No pericardial effusion. 11 calcifications noted consistent with prior granulomatous disease.  CT CHEST W/O  05/14/16 (previously reviewed by me): Rounded bilateral pulmonary nodules measuring up to 6 mm. RUL nodule measuring 3mm is new when compared with 2006 CT scan. No obvious new or developing nodule appreciated. No parenchymal mass or opacity appreciated. Upper lobe predominant centrilobular emphysema. No pathologic mediastinal adenopathy. No pleural effusion or thickening. No pericardial effusion.  CXR PA/LAT 11/09/15 (previously reviewed by me): No focal opacity or pleural effusion appreciated. Linear scarring noted. Heart normal in size & mediastinum normal in contour.  LABS 10/29/15 Alpha-1 antitrypsin: MM (143)  04/25/14 ABG on RA:  7.42 / 36 / 68.7    Assessment & Plan:  76 y.o. male with underlying severe COPD with emphysema, acute hypoxic respiratory failure, & bilateral pulmonary nodules. Patient is nodules have remained stable since August comparatively. Additionally, reviewing his 6 minute walk test today shows no evidence of an oxygen requirement. Patient's CT imaging shows continued stability in his bilateral lung nodules. Given his risk factors we will continue to monitor these lung nodules. With the improvement in his breathing both symptomatically and with his oxygen requirement I do feel continuing his current inhaler regimen is imperative. I instructed the patient to contact my office if he had any new breathing problems or questions before next appointment.  1. Severe COPD with emphysema: Continuing Symbicort & Spiriva Respimat. No changes. Patient given prescription drug assistance paperwork for his review. Patient desires to hold off on pulmonary rehabilitation but will reassess in the next month or so. 2. Bilateral pulmonary nodules: Repeat CT scan without contrast of the chest in August 2018. 3. Health maintenance: Status post Prevnar vaccine August 2015, Influenza Vaccine September 2017, & Pneumovax September 2014.  4. Follow-up: Return to clinic in 6 months or sooner if  needed.  Sonia Baller Ashok Cordia, M.D. [facility] Pulmonary [facility] Critical Care Pager:  [phone] After 3pm or if no response, call [phone] 3:18 PM 12/24/16

## 2017-04-19 ENCOUNTER — Other Ambulatory Visit: Payer: Self-pay | Admitting: Pulmonary Disease

## 2017-04-22 ENCOUNTER — Ambulatory Visit (HOSPITAL_COMMUNITY)
Admission: RE | Admit: 2017-04-22 | Discharge: 2017-04-22 | Disposition: A | Discharge status: Home / Self Care | Payer: Medicare Other | Source: Ambulatory Visit | Attending: Pulmonary Disease | Admitting: Pulmonary Disease

## 2017-04-22 DIAGNOSIS — I700 Atherosclerosis of aorta: Secondary | ICD-10-CM | POA: Insufficient documentation

## 2017-04-22 DIAGNOSIS — R918 Other nonspecific abnormal finding of lung field: Secondary | ICD-10-CM | POA: Insufficient documentation

## 2017-04-22 DIAGNOSIS — I2510 Atherosclerotic heart disease of native coronary artery without angina pectoris: Secondary | ICD-10-CM | POA: Diagnosis not present

## 2017-04-22 DIAGNOSIS — J439 Emphysema, unspecified: Secondary | ICD-10-CM | POA: Diagnosis not present

## 2017-06-23 ENCOUNTER — Ambulatory Visit (INDEPENDENT_AMBULATORY_CARE_PROVIDER_SITE_OTHER): Payer: Medicare Other | Admitting: Pulmonary Disease

## 2017-06-23 ENCOUNTER — Encounter: Payer: Self-pay | Admitting: Pulmonary Disease

## 2017-06-23 VITALS — BP 140/80 | HR 74 | Ht 70.0 in | Wt 163.0 lb

## 2017-06-23 DIAGNOSIS — J449 Chronic obstructive pulmonary disease, unspecified: Secondary | ICD-10-CM

## 2017-06-23 DIAGNOSIS — Z23 Encounter for immunization: Secondary | ICD-10-CM | POA: Diagnosis not present

## 2017-06-23 DIAGNOSIS — R918 Other nonspecific abnormal finding of lung field: Secondary | ICD-10-CM

## 2017-06-23 NOTE — Progress Notes (Signed)
Subjective:    Patient ID: [name] [name], male    DOB: 12/30/1940, 76 y.o.   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  HPI Severe COPD with emphysema: Continued on Symbicort and Spiriva at last appointment. Patient continued to defer pulmonary rehabilitation at last appointment. Previously has tried Anoro, [employer], [employer]. He feels like his breathing is "getting worse". He notices increased dyspnea on exertion. He denies any significant coughing or wheezing. No exacerbations since last appointment. He is adherent to his Symbicort & Spiriva. Uses his rescue inhaler 2-3 times a day with activity.   Bilateral pulmonary nodules: History of nodules going back to 2006. New nodule noted in right upper lobe on August 2017 CT imaging. Nodules all continue to measure less than 1 cm in size and have not changed significantly as a repeat CT imaging in August.  Review of Systems He reports he is still using oxygen at home and uses it at night before he goes to bed. He denies any chest pain or pressure. No fever or chills. No abdominal pain or nausea.   No Known Allergies  Current Outpatient Prescriptions on File Prior to Visit  Medication Sig Dispense Refill  . aspirin 81 MG tablet Take 81 mg by mouth 2 (two) times a week. Twice weekly    . diltiazem (DILT-XR) 120 MG 24 hr capsule TAKE TWO CAPSULES BY MOUTH IN THE MORNING AND ONE CAPSULE IN THE EVENING -- **PLEASE KEEP UPCOMING APPOINTMENT FOR FUTURE REFILLS** 100 capsule 11  . fluticasone furoate-vilanterol (BREO ELLIPTA) 100-25 MCG/INH AEPB INHALE ONE PUFF BY MOUTH ONCE DAILY 60 each 5  . LYRICA 150 MG capsule TAKE ONE CAPSULE BY MOUTH THREE TIMES DAILY 90 capsule 0  . Misc. Devices (ACAPELLA) MISC Use as directed 1 each 0  . Oxycodone HCl 10 MG TABS Take 1 mg by mouth every 6 (six) hours as needed (pain).     Marland Kitchen PROAIR HFA 108 (90 Base) MCG/ACT inhaler INHALE TWO PUFFS BY MOUTH EVERY 6 HOURS AS  NEEDED FOR WHEEZING OR SHORTNESS OF BREATH 9 each 7  . Spacer/Aero-Holding Chambers (AEROCHAMBER MV) inhaler Use as instructed 1 each 0  . SPIRIVA RESPIMAT 2.5 MCG/ACT AERS INHALE TWO PUFFS BY MOUTH ONCE DAILY 3 Inhaler 3  . SYMBICORT 160-4.5 MCG/ACT inhaler INHALE 2 PUFFS BY MOUTH TWICE DAILY 1 Inhaler 5   No current facility-administered medications on file prior to visit.     Past Medical History:  Diagnosis Date  . COPD with emphysema ([place])    Dr. Joya Gaskins, Gold stage C  . DJD (degenerative joint disease), lumbar   . History of prostate cancer    Status post radioactive seed implants  . History of shingles   . Incomplete emptying of bladder   . Mixed hyperlipidemia   . PAF (paroxysmal atrial fibrillation) ([place])   . Palpitations   . Peripheral neuropathy   . Sciatic nerve pain    Secondary to shingles 2011  . Urethral stricture   . Wears partial dentures    Upper and lower    Past Surgical History:  Procedure Laterality Date  . CATARACT EXTRACTION W/ INTRAOCULAR LENS  IMPLANT, BILATERAL  2012  . COLONOSCOPY  02-14-2003  . CYSTO/  BALLOON DILATION AND INCISION BLADDER NECK CONTRACTURE  X5  LAST ONE 01-24-2009  . CYSTOSCOPY WITH URETHRAL DILATATION  09/15/2011   Procedure: CYSTOSCOPY WITH URETHRAL DILATATION;  Surgeon: Bernestine Amass, MD;  Location:  [facility];  Service: Urology;  Laterality: N/A;  CYSTOSCOPY, BALLOON DILATION   . CYSTOSCOPY WITH URETHRAL DILATATION N/A 04/03/2014   Procedure: CYSTOSCOPY WITH dilatation;  Surgeon: Bernestine Amass, MD;  Location: [facility];  Service: Urology;  Laterality: N/A;  . RADIOACTIVE PROSTATE SEED IMPLANTS/  CYSTO WITH BALLOON DILATION BLADDER NECK CONTRACTURE  11-26-2006    Family History  Problem Relation Age of Onset  . COPD Mother   . Heart disease Brother     Social History   Social History  . Marital status: Married    Spouse name: [name] [name]  . Number of children: 3  . Years of education: 9  th   Occupational History  .  Retired    Retired   Social History Main Topics  . Smoking status: Former Smoker    Packs/day: 2.00    Years: 40.00    Types: Cigarettes    Start date: 06/19/1954    Quit date: 09/15/1998  . Smokeless tobacco: Never Used     Comment: Counseled to remain smoke free  . Alcohol use No  . Drug use: No  . Sexual activity: Not Asked   Other Topics Concern  . None   Social History Narrative   Patient lives at home with [name] [name]). Patient is retired and has three daughters and one step daughter. Patient has a 9th grade education.   Right handed.   Caffeine- three  cups daily.      [facility]:   Originally from [place], [name]. Always lived in [name]. 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 No bird, mold, or asbestos exposure. 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       Objective:   Physical Exam BP 140/80 (BP Location: Left Arm, Cuff Size: Normal)   Pulse 74   Ht 5\' 10"  (1.778 m)   Wt 163 lb (73.9 kg)   SpO2 95%   BMI 23.39 kg/m   General:  Awake. No distress. Comfortable.  Integument:  Warm. Dry. No rash on exposed skin.Marland Kitchen Extremities:  No cyanosis.  HEENT:  Moist mucous membranes. No scleral icterus. No scleral injection. Cardiovascular:  Regular rate. No edema. Unable to appreciate JVD.  Pulmonary:  Symmetrically decreased breath sounds. Prolonged exhalation phase. Normal work of breathing on room air. Abdomen: Soft. Normal bowel sounds. Nondistended.  Musculoskeletal:  Normal bulk and tone. No joint effusion appreciated. Neurological:  Cranial nerves 2-12 grossly in tact. No meningismus. Moving all 4 extremities equally.   PFT 05/21/16: FVC 2.64 L (67%) FEV1 1.30 L (46%) FEV1/FVC 0.49 FEF 25-75 0.47 L (23%) negative bronchodilator response 02/19/16: FVC 2.65 L (67%) FEV1 1.32 L (46%) FEV1/FVC 0.50 FEF 25-75 0.48 L (23%) negative bronchodilator response  TLC 5.72 L (86%) RV 103% ERV 75% DLCO corrected 60% (hemoglobin 14.4) 03/03/14: FVC 2.47 L (61%) FEV1 1.25 L (42%) FEV1/FVC 0.50 FEF 25-75 0.44 L (20%) negative bronchodilator response 07/17/14: FVC 2.76 L (69%) FEV1 1.41 L (48%) FEV1/FVC 0.51 FEF 25-75 0.52 L (24%) negative bronchodilator response  6MWT 06/23/17:  Walked 3 laps / Baseline Sat 100% on RA / Nadir Sat 94% on RA 12/24/16:  Walked 384 meters / Baseline Sat 100% on RA / nadir Sat 96% on RA 10/21/16:  Walked 2 laps / Baseline Sat 92% on RA / Nadir Sat 87% (required 2 L/m to maintain saturation with walking) 02/18/16: Walked 347 meters /  Baseline Sat 94% on RA / Nadir Sat 93% on RA  IMAGING CT CHEST W/O 04/22/17 (personally reviewed by me):  Bilateral lung nodules without significant change in size and all measuring less than 1 cm in maximal dimension. No pathologic mediastinal adenopathy. No pleural effusion or thickening. No pericardial effusion. Centrilobular and paraseptal emphysema again noted.  CT CHEST W/O 12/08/16 (previously reviewed by me): Bilateral pulmonary nodules. 6 mm nodule within right upper lobe & 7 mm nodule within the left lower lobe unchanged compared with previous. No new nodule appreciated. Apical predominant emphysema noted. No pleural effusion or thickening. No pericardial effusion. No pathologic mediastinal adenopathy.  CTA CHEST 09/18/16 (previously reviewed by me): No pulmonary emboli. No pathologic mediastinal adenopathy. No pleural effusion or thickening. No pericardial effusion. Apical predominant emphysematous changes again noted. Bilateral pulmonary nodules again noted without apparent significant enlargement.  CT CHEST W/O 07/24/16 (previously reviewed by me):Multiple bilateral pulmonary nodules unchanged since 2007. Right upper lobe nodule/scarring unchanged from prior CT scan and measures 28mm still. Upper lobe predominant emphysematous changes noted. No pathologic mediastinal adenopathy. No pleural effusion  or thickening. No pericardial effusion. 11 calcifications noted consistent with prior granulomatous disease.  CT CHEST W/O 05/14/16 (previously reviewed by me): Rounded bilateral pulmonary nodules measuring up to 6 mm. RUL nodule measuring 50mm is new when compared with 2006 CT scan. No obvious new or developing nodule appreciated. No parenchymal mass or opacity appreciated. Upper lobe predominant centrilobular emphysema. No pathologic mediastinal adenopathy. No pleural effusion or thickening. No pericardial effusion.  CXR PA/LAT 11/09/15 (previously reviewed by me): No focal opacity or pleural effusion appreciated. Linear scarring noted. Heart normal in size & mediastinum normal in contour.  LABS 10/29/15 Alpha-1 antitrypsin: MM (143)  04/25/14 ABG on RA:  7.42 / 36 / 68.7    Assessment & Plan:  76 y.o. male with underlying severe COPD with emphysema. Patient's hypoxia could not be elicited during brief walk test today. He does still have oxygen at home through a DME company and inquired regarding a portable oxygen concentrator. I attempted to reassure the patient that oxygen therapy may be unnecessary. We also discussed transitioning to a nebulizer regimen that may be more effective moving forward depending upon his degree of symptom control. We also discussed referring to pulmonary rehabilitation but the patient is concerned regarding the severity of his lung disease and does not wish to take time away from his wife and family that could be spent with them. The patient is significantly concerned about worsening lung function and end-of-life. I did give him a packet of information regarding living wills as well as medical power of attorney. I instructed the patient to contact my office if he had questions or concerns before his next appointment. We have also placed an order for his DME company to evaluate him for a portable oxygen concentrator.  1. Severe COPD with emphysema:  Continuing patient on  Symbicort and Spiriva for now. Consider transitioning to nebulizer regimen at next appointment. Checking full on a function testing and 6 minute walk test on room air on or before next appointment. 2. Bilateral pulmonary nodules:  Plan for repeat CT imaging August 2019. 3. Advanced care planning:  Discussed patient's end-of-life wishes today. He does not have a MPOA or living will. We discussed the risk of endotracheal intubation with respiratory illness and I spent 4 minutes of his visit today addressing this. Patient was given a packet to give him information on a living will and establishing  and MPOA. 4. Health maintenance: Status post Prevnar Vaccine August 2015 & Pneumovax September 2014. Administering high-dose influenza vaccine today. 5. Follow-up: Return to clinic in 3 months or sooner if needed.  Sonia Baller Ashok Cordia, M.D. [facility] Pager:  [phone] After 3pm or if no response, call [phone] 11:39 AM 06/23/17

## 2017-06-23 NOTE — Patient Instructions (Addendum)
   Continue using your inhalers as prescribed.  Call our office if you have any new breathing problems or questions before your next appointment.  We will plan on repeating your chest CT scan next August or sooner if needed.  TESTS ORDERED: 1. Full PFTs at follow-up 2. 6MWT on room air on or before next appointment 3. CT Chest w/o August 2019

## 2017-07-23 ENCOUNTER — Encounter (HOSPITAL_COMMUNITY): Payer: Self-pay

## 2017-07-23 ENCOUNTER — Ambulatory Visit (HOSPITAL_COMMUNITY): Payer: Medicare Other | Attending: Physical Medicine and Rehabilitation

## 2017-07-23 DIAGNOSIS — M25511 Pain in right shoulder: Secondary | ICD-10-CM | POA: Insufficient documentation

## 2017-07-23 DIAGNOSIS — R29898 Other symptoms and signs involving the musculoskeletal system: Secondary | ICD-10-CM

## 2017-07-23 DIAGNOSIS — G8929 Other chronic pain: Secondary | ICD-10-CM | POA: Insufficient documentation

## 2017-07-23 DIAGNOSIS — M25611 Stiffness of right shoulder, not elsewhere classified: Secondary | ICD-10-CM | POA: Insufficient documentation

## 2017-07-23 NOTE — Therapy (Signed)
[facility] [place], [name], [address] Phone: [phone]   Fax:  [phone]  Occupational Therapy Evaluation  Patient Details  Name: KEANAN MELANDER MRN: [id number] Date of Birth: 12-23-1940 Referring Provider: Dr. Corinna Capra   Encounter Date: 07/23/2017  OT End of Session - 07/23/17 1106    Visit Number  1    Number of Visits  8    Date for OT Re-Evaluation  08/22/17    Authorization Type  1) Medicare 2) [place] Time Period  before 10th visit    Authorization - Visit Number  1    Authorization - Number of Visits  10    OT Start Time  0945    OT Stop Time  1038    OT Time Calculation (min)  53 min    Activity Tolerance  Patient tolerated treatment well    Behavior During Therapy  [facility] for tasks assessed/performed       Past Medical History:  Diagnosis Date  . COPD with emphysema ([place])    Dr. Joya Gaskins, Gold stage C  . DJD (degenerative joint disease), lumbar   . History of prostate cancer    Status post radioactive seed implants  . History of shingles   . Incomplete emptying of bladder   . Mixed hyperlipidemia   . PAF (paroxysmal atrial fibrillation) ([place])   . Palpitations   . Peripheral neuropathy   . Sciatic nerve pain    Secondary to shingles 2011  . Urethral stricture   . Wears partial dentures    Upper and lower    Past Surgical History:  Procedure Laterality Date  . CATARACT EXTRACTION W/ INTRAOCULAR LENS  IMPLANT, BILATERAL  2012  . COLONOSCOPY  02-14-2003  . CYSTO/  BALLOON DILATION AND INCISION BLADDER NECK CONTRACTURE  X5  LAST ONE 01-24-2009  . RADIOACTIVE PROSTATE SEED IMPLANTS/  CYSTO WITH BALLOON DILATION BLADDER NECK CONTRACTURE  11-26-2006    There were no vitals filed for this visit.  Subjective Assessment - 07/23/17 0954    Subjective   S: I don't have the muscles to begin with because of the medication I'm taking.    Pertinent History  Patient is 76 y/o male  S/P right shoulder bursitis which occured this past Spring after he dog jerked the leash while he was walking. Dr. Greta Doom has referred patient to occupational therapy for evaluatin and treatment.     Special Tests  FOTO score: 54/100     Patient Stated Goals  To be able to decrease pain and use right arm as normally as possible.    Currently in Pain?  Yes    Pain Score  3     Pain Location  Shoulder    Pain Orientation  Right    Pain Descriptors / Indicators  Other (Comment) catching sensation    Pain Type  Chronic pain    Pain Radiating Towards  N/A    Pain Onset  More than a month ago    Pain Frequency  Constant    Aggravating Factors   Yanked away, wrong movement    Pain Relieving Factors  rest, heating pad, pain relief cream, pain medication    Effect of Pain on Daily Activities  Severe effect when pain is increased    Multiple Pain Sites  No        [facility] OT Assessment - 07/23/17 0946      Assessment   Diagnosis  right shoulder bursitis    Referring Provider  Dr. Corinna Capra    Onset Date  -- Spring 2018    Assessment  -- once a month visits    Prior Therapy  None      Precautions   Precautions  None      Restrictions   Weight Bearing Restrictions  No      Balance Screen   Has the patient fallen in the past 6 months  No      Home  Environment   Family/patient expects to be discharged to:  Private residence    Lives With  Spouse      Prior Function   Level of [place]  Retired      ADL   ADL comments  Difficulty trimming hedges, reaching and completing activities up overhead, lifting heavy items, getting shirt.      Mobility   Mobility Status  Independent      Written Expression   Dominant Hand  Right      Vision - History   Baseline Vision  No visual deficits      Cognition   Overall Cognitive Status  Within Functional Limits for tasks assessed      Observation/Other Assessments   Observations  Empty Can Test: positive        ROM / Strength   AROM / PROM / Strength  AROM;PROM;Strength      Palpation   Palpation comment  Min fascial restrictions in right upper arm, trapezius, and scapularis region.      AROM   Overall AROM Comments  Assessed seated. IR/er abducted    AROM Assessment Site  Shoulder    Right/Left Shoulder  Right    Right Shoulder Flexion  101 Degrees    Right Shoulder ABduction  140 Degrees    Right Shoulder Internal Rotation  55 Degrees    Right Shoulder External Rotation  90 Degrees      PROM   Overall PROM Comments  Assessed supine. IR/er abducted    PROM Assessment Site  Shoulder    Right/Left Shoulder  Right    Right Shoulder Flexion  145 Degrees    Right Shoulder ABduction  128 Degrees    Right Shoulder Internal Rotation  50 Degrees    Right Shoulder External Rotation  60 Degrees      Strength   Overall Strength Comments  Assessed seated. IR/ir adducted    Strength Assessment Site  Shoulder    Right/Left Shoulder  Right    Right Shoulder Flexion  4/5    Right Shoulder ABduction  4/5    Right Shoulder Internal Rotation  4+/5    Right Shoulder External Rotation  4+/5                      OT Education - 07/23/17 1103    Education provided  Yes    Education Details  Discussed plan of care and diagnosis of bursitis. AA/ROM shoulder exercises (supine)    Person(s) Educated  Patient    Methods  Explanation;Demonstration;Verbal cues    Comprehension  Returned demonstration;Verbalized understanding;Need further instruction       OT Short Term Goals - 07/23/17 1146      OT SHORT TERM GOAL #1   Title  Patient will be educated and independent with HEP to increase functional use of RUE during daily tasks.     Time  4  Period  Weeks    Status  New    Target Date  08/22/17      OT SHORT TERM GOAL #2   Title  Patient will increase A/ROM of RUE to [facility] to increase ability to return to completing overhead activities with less difficulty.    Time  4    Period   Weeks    Status  New      OT SHORT TERM GOAL #3   Title  Patient will decrease pain level to 2/10 when using RUE for daily tasks.     Time  4    Period  Weeks    Status  New      OT SHORT TERM GOAL #4   Title  Patient will decrease fascial restrictions to min amount or less in RUE to increase functional reaching ability.    Time  4    Period  Weeks    Status  New               Plan - 07/23/17 1107    Clinical Impression Statement  A: Patient is a 76 y/o male S/P right shoulder bursitis which is causing increased pain, fascial restrictions, and decreased ROM resulting in difficulty completing daily tasks with right UE as dominant.     Occupational Profile and client history currently impacting functional performance  Motivated to return prior level of function.    Occupational performance deficits (Please refer to evaluation for details):  ADL's    Rehab Potential  Good    Current Impairments/barriers affecting progress:  Chronic back pain, COPD, peripheral neuropathy    OT Frequency  2x / week    OT Duration  4 weeks    OT Treatment/Interventions  Self-care/ADL training;Ultrasound;Cryotherapy;Electrical Stimulation;Moist Heat;Therapeutic activities;Therapeutic exercises;Manual Therapy;Passive range of motion;Patient/family education;DME and/or AE instruction    Plan  P: Patient will benefit from skilled OT services to increase functional performance during daily tasks using RUE. Treatment Plan: Focus on myofascial release, manual stretching, AA/ROM, and A/ROM. Pt's strength is at baseline although we could do some gentle scapular strengthening.     Clinical Decision Making  Limited treatment options, no task modification necessary    OT Home Exercise Plan  11/8: AA/ROM shoulder exercises supine.    Consulted and Agree with Plan of Care  Patient       Patient will benefit from skilled therapeutic intervention in order to improve the following deficits and impairments:   Impaired UE functional use, Increased fascial restricitons, Decreased range of motion, Pain, Decreased strength  Visit Diagnosis: Other symptoms and signs involving the musculoskeletal system - Plan: Ot plan of care cert/re-cert  Stiffness of right shoulder, not elsewhere classified - Plan: Ot plan of care cert/re-cert  Chronic right shoulder pain - Plan: Ot plan of care cert/re-cert  G-Codes - 99/35/70 1243    Functional Assessment Tool Used (Outpatient only)  FOTO score: 54/100 (46% impaired)    Functional Limitation  Carrying, moving and handling objects    Carrying, Moving and Handling Objects Current Status (V7793)  At least 40 percent but less than 60 percent impaired, limited or restricted    Carrying, Moving and Handling Objects Goal Status (J0300)  At least 20 percent but less than 40 percent impaired, limited or restricted       Problem List Patient Active Problem List   Diagnosis Date Noted  . Multiple lung nodules on CT 05/14/2016  . COPD, severe ([place]) 02/19/2016  . Emphysema of lung (  [place]) 02/19/2016  . Prostate cancer ([place])   . Mixed hyperlipidemia   . DJD (degenerative joint disease), lumbar   . Neuropathy   . Back pain   . 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 PALPITATIONS 02/19/2009  . HYPERLIPIDEMIA 08/31/2007  . ATRIAL FIBRILLATION, PAROXYSMAL 08/31/2007  . COPD with chronic bronchitis Gold B  08/31/2007   Ailene Ravel, OTR/L,CBIS  [phone]  07/23/2017, 12:46 PM  [facility] [address] [place], [name], [address] Phone: [phone]   Fax:  [phone]  Name: BURDETTE FOREHAND MRN: [id number] Date of Birth: 08-09-1941

## 2017-07-23 NOTE — Patient Instructions (Signed)

## 2017-07-27 ENCOUNTER — Ambulatory Visit (HOSPITAL_COMMUNITY): Payer: Medicare Other | Admitting: Specialist

## 2017-07-27 ENCOUNTER — Encounter (HOSPITAL_COMMUNITY): Payer: Self-pay | Admitting: Specialist

## 2017-07-27 DIAGNOSIS — R29898 Other symptoms and signs involving the musculoskeletal system: Secondary | ICD-10-CM | POA: Diagnosis not present

## 2017-07-27 DIAGNOSIS — M25511 Pain in right shoulder: Secondary | ICD-10-CM

## 2017-07-27 DIAGNOSIS — M25611 Stiffness of right shoulder, not elsewhere classified: Secondary | ICD-10-CM

## 2017-07-27 DIAGNOSIS — G8929 Other chronic pain: Secondary | ICD-10-CM

## 2017-07-27 NOTE — Therapy (Signed)
[facility] [place], [name], [address] Phone: [phone]   Fax:  [phone]  Occupational Therapy Treatment  Patient Details  Name: [name] [name] MRN: [id number] Date of Birth: Dec 07, 1940 Referring Provider: Dr. Corinna Capra   Encounter Date: 07/27/2017  OT End of Session - 07/27/17 1500    Visit Number  2    Number of Visits  8    Date for OT Re-Evaluation  08/22/17    Authorization Type  1) Medicare 2) [place] Time Period  before 10th visit    Authorization - Visit Number  2    Authorization - Number of Visits  10    OT Start Time  1350    OT Stop Time  1430    OT Time Calculation (min)  40 min       Past Medical History:  Diagnosis Date  . COPD with emphysema ([place])    Dr. Joya Gaskins, Gold stage C  . DJD (degenerative joint disease), lumbar   . History of prostate cancer    Status post radioactive seed implants  . History of shingles   . Incomplete emptying of bladder   . Mixed hyperlipidemia   . PAF (paroxysmal atrial fibrillation) ([place])   . Palpitations   . Peripheral neuropathy   . Sciatic nerve pain    Secondary to shingles 2011  . Urethral stricture   . Wears partial dentures    Upper and lower    Past Surgical History:  Procedure Laterality Date  . CATARACT EXTRACTION W/ INTRAOCULAR LENS  IMPLANT, BILATERAL  2012  . COLONOSCOPY  02-14-2003  . CYSTO/  BALLOON DILATION AND INCISION BLADDER NECK CONTRACTURE  X5  LAST ONE 01-24-2009  . RADIOACTIVE PROSTATE SEED IMPLANTS/  CYSTO WITH BALLOON DILATION BLADDER NECK CONTRACTURE  11-26-2006    There were no vitals filed for this visit.  Subjective Assessment - 07/27/17 1459    Subjective   S:  I have been doing the exercises at home.     Currently in Pain?  Yes    Pain Score  2     Pain Location  Shoulder    Pain Orientation  Right    Pain Descriptors / Indicators  Aching    Pain Type  Chronic pain    Pain Onset  More than  a month ago    Pain Frequency  Intermittent         [facility] OT Assessment - 07/27/17 0001      Assessment   Diagnosis  right shoulder bursitis               OT Treatments/Exercises (OP) - 07/27/17 0001      Exercises   Exercises  Shoulder      Shoulder Exercises: Supine   Protraction  PROM;AAROM;10 reps    Horizontal ABduction  PROM;AAROM;10 reps    External Rotation  PROM;AAROM;10 reps    Internal Rotation  PROM;AAROM;10 reps    Flexion  PROM;AAROM;10 reps    ABduction  PROM;AAROM;10 reps      Shoulder Exercises: Seated   Elevation  AROM;10 reps    Extension  AROM;10 reps    Row  AROM;10 reps    Protraction  AAROM;10 reps    Horizontal ABduction  AAROM;10 reps    External Rotation  AAROM;10 reps    Internal Rotation  AAROM;10 reps    Flexion  AAROM;10 reps    Abduction  AAROM;10  reps      Shoulder Exercises: Pulleys   Flexion  1 minute    ABduction  1 minute      Manual Therapy   Manual Therapy  Myofascial release    Manual therapy comments  manual therapy completed seperately from all other interventions this date    Myofascial Release  myofascial release and manual stretching to right upper arm, scapular, and shoulder region to decrease pain and retrictions and improve pain free mobility in his right shoulder region.               OT Education - 07/27/17 1500    Education provided  Yes    Education Details  reviewed plan of care and issued a copy to patient    Person(s) Educated  Patient    Methods  Explanation;Handout    Comprehension  Verbalized understanding       OT Short Term Goals - 07/27/17 1502      OT SHORT TERM GOAL #1   Title  Patient will be educated and independent with HEP to increase functional use of RUE during daily tasks.     Time  4    Period  Weeks    Status  On-going      OT SHORT TERM GOAL #2   Title  Patient will increase A/ROM of RUE to [facility] to increase ability to return to completing overhead activities with less  difficulty.    Time  4    Period  Weeks    Status  On-going      OT SHORT TERM GOAL #3   Title  Patient will decrease pain level to 2/10 when using RUE for daily tasks.     Time  4    Period  Weeks    Status  On-going      OT SHORT TERM GOAL #4   Title  Patient will decrease fascial restrictions to min amount or less in RUE to increase functional reaching ability.    Time  4    Period  Weeks    Status  On-going               Plan - 07/27/17 1500    Clinical Impression Statement  A:  Patient able to complete AA/ROM in supine and seated.  Reports less pain and difficulty in seating vs supine.  Pain experienced in shoulder joint anteriorly with end range vs mid range occurence.     Plan  P:  Continue skilled OT treatment with focus on proximal shoulder stability for improved end range mobiilty.  Add wall wash and proximal shoulder strengthening in supine.        Patient will benefit from skilled therapeutic intervention in order to improve the following deficits and impairments:  Impaired UE functional use, Increased fascial restricitons, Decreased range of motion, Pain, Decreased strength  Visit Diagnosis: Other symptoms and signs involving the musculoskeletal system  Stiffness of right shoulder, not elsewhere classified  Chronic right shoulder pain    Problem List Patient Active Problem List   Diagnosis Date Noted  . Multiple lung nodules on CT 05/14/2016  . COPD, severe ([place]) 02/19/2016  . Emphysema of lung ([place]) 02/19/2016  . Prostate cancer ([place])   . Mixed hyperlipidemia   . DJD (degenerative joint disease), lumbar   . Neuropathy   . Back pain   . 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 PALPITATIONS 02/19/2009  . HYPERLIPIDEMIA 08/31/2007  . ATRIAL FIBRILLATION, PAROXYSMAL 08/31/2007  .  COPD with chronic bronchitis Clayborn Heron  08/31/2007    Vangie Bicker, [place], OTR/L [phone]  07/27/2017, 3:06 PM  [facility] [address] [place], [name], [address] Phone: [phone]   Fax:  [phone]  Name: [name] [name] MRN: [id number] Date of Birth: 23-May-1941

## 2017-07-31 ENCOUNTER — Ambulatory Visit (HOSPITAL_COMMUNITY): Payer: Medicare Other | Admitting: Occupational Therapy

## 2017-07-31 ENCOUNTER — Other Ambulatory Visit: Payer: Self-pay

## 2017-07-31 ENCOUNTER — Encounter (HOSPITAL_COMMUNITY): Payer: Self-pay | Admitting: Occupational Therapy

## 2017-07-31 DIAGNOSIS — R29898 Other symptoms and signs involving the musculoskeletal system: Secondary | ICD-10-CM | POA: Diagnosis not present

## 2017-07-31 DIAGNOSIS — M25611 Stiffness of right shoulder, not elsewhere classified: Secondary | ICD-10-CM

## 2017-07-31 DIAGNOSIS — G8929 Other chronic pain: Secondary | ICD-10-CM

## 2017-07-31 DIAGNOSIS — M25511 Pain in right shoulder: Secondary | ICD-10-CM

## 2017-07-31 NOTE — Therapy (Signed)
[facility] [place], [name], [address] Phone: [phone]   Fax:  [phone]  Occupational Therapy Treatment  Patient Details  Name: [name] [name] MRN: [id number] Date of Birth: 18-Jan-1941 Referring Provider: Dr. Corinna Capra   Encounter Date: 07/31/2017  OT End of Session - 07/31/17 1203    Visit Number  3    Number of Visits  8    Date for OT Re-Evaluation  08/22/17    Authorization Type  1) Medicare 2) [place] Time Period  before 10th visit    Authorization - Visit Number  3    Authorization - Number of Visits  10    OT Start Time  1115    OT Stop Time  1201    OT Time Calculation (min)  46 min       Past Medical History:  Diagnosis Date  . COPD with emphysema ([place])    Dr. Joya Gaskins, Gold stage C  . DJD (degenerative joint disease), lumbar   . History of prostate cancer    Status post radioactive seed implants  . History of shingles   . Incomplete emptying of bladder   . Mixed hyperlipidemia   . PAF (paroxysmal atrial fibrillation) ([place])   . Palpitations   . Peripheral neuropathy   . Sciatic nerve pain    Secondary to shingles 2011  . Urethral stricture   . Wears partial dentures    Upper and lower    Past Surgical History:  Procedure Laterality Date  . CATARACT EXTRACTION W/ INTRAOCULAR LENS  IMPLANT, BILATERAL  2012  . COLONOSCOPY  02-14-2003  . CYSTO/  BALLOON DILATION AND INCISION BLADDER NECK CONTRACTURE  X5  LAST ONE 01-24-2009  . CYSTOSCOPY WITH dilatation N/A 04/03/2014   Performed by Bernestine Amass, MD at [facility]  . CYSTOSCOPY WITH URETHRAL DILATATION N/A 09/15/2011   Performed by Bernestine Amass, [facility] at [facility]  . RADIOACTIVE PROSTATE SEED IMPLANTS/  CYSTO WITH BALLOON DILATION BLADDER NECK CONTRACTURE  11-26-2006    There were no vitals filed for this visit.  Subjective Assessment - 07/31/17 1113    Subjective   S: It's only  hurting if I try to use it out of the ordinary.     Currently in Pain?  Yes    Pain Score  8     Pain Location  Shoulder    Pain Orientation  Right    Pain Descriptors / Indicators  Aching;Stabbing    Pain Type  Chronic pain    Pain Radiating Towards  n/a    Pain Onset  More than a month ago    Pain Frequency  Intermittent    Aggravating Factors   yanked away, wrong movement    Pain Relieving Factors  rest, heating pad, pain relief cream, pain medication    Effect of Pain on Daily Activities  severe effect when pain is increased.     Multiple Pain Sites  No         [facility] OT Assessment - 07/31/17 1112      Assessment   Diagnosis  right shoulder bursitis      Precautions   Precautions  None               OT Treatments/Exercises (OP) - 07/31/17 1113      Exercises   Exercises  Shoulder      Shoulder Exercises: Supine  Protraction  PROM;5 reps;AAROM;10 reps    Horizontal ABduction  PROM;5 reps;AAROM;10 reps    External Rotation  PROM;5 reps;AAROM;10 reps    Internal Rotation  PROM;5 reps;AAROM;10 reps    Flexion  PROM;5 reps;AAROM;10 reps    ABduction  PROM;5 reps;AAROM;10 reps      Shoulder Exercises: Seated   Protraction  AAROM;10 reps    Horizontal ABduction  AAROM;10 reps    External Rotation  AAROM;10 reps    Internal Rotation  AAROM;10 reps    Flexion  AAROM;10 reps    Abduction  AAROM;10 reps      Shoulder Exercises: ROM/Strengthening   Wall Wash  1'    Proximal Shoulder Strengthening, Supine  10X each no rest breaks      Manual Therapy   Manual Therapy  Myofascial release    Manual therapy comments  manual therapy completed seperately from all other interventions this date    Myofascial Release  myofascial release and manual stretching to right upper arm, scapular, and shoulder region to decrease pain and retrictions and improve pain free mobility in his right shoulder region.                 OT Short Term Goals - 07/27/17 1502       OT SHORT TERM GOAL #1   Title  Patient will be educated and independent with HEP to increase functional use of RUE during daily tasks.     Time  4    Period  Weeks    Status  On-going      OT SHORT TERM GOAL #2   Title  Patient will increase A/ROM of RUE to [facility] to increase ability to return to completing overhead activities with less difficulty.    Time  4    Period  Weeks    Status  On-going      OT SHORT TERM GOAL #3   Title  Patient will decrease pain level to 2/10 when using RUE for daily tasks.     Time  4    Period  Weeks    Status  On-going      OT SHORT TERM GOAL #4   Title  Patient will decrease fascial restrictions to min amount or less in RUE to increase functional reaching ability.    Time  4    Period  Weeks    Status  On-going               Plan - 07/31/17 1139    Clinical Impression Statement  A: Pt reporting continued pain since last session, has been using heating pad however did not use yesterday, completes HEP occasionally. Continued with AA/ROM, added proximal shoulder strengthening in supine for stability. Added wall wash. Verbal cuing for form and technique.     Plan  P: Add proximal shoulder strengthening in sitting, add supine serratus anterior punches       Patient will benefit from skilled therapeutic intervention in order to improve the following deficits and impairments:  Impaired UE functional use, Increased fascial restricitons, Decreased range of motion, Pain, Decreased strength  Visit Diagnosis: Other symptoms and signs involving the musculoskeletal system  Stiffness of right shoulder, not elsewhere classified  Chronic right shoulder pain    Problem List Patient Active Problem List   Diagnosis Date Noted  . Multiple lung nodules on CT 05/14/2016  . COPD, severe ([place]) 02/19/2016  . Emphysema of lung ([place]) 02/19/2016  . Prostate cancer ([place])   .  Mixed hyperlipidemia   . DJD (degenerative joint disease), lumbar   . Neuropathy    . Back pain   . 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 PALPITATIONS 02/19/2009  . HYPERLIPIDEMIA 08/31/2007  . ATRIAL FIBRILLATION, PAROXYSMAL 08/31/2007  . COPD with chronic bronchitis Gold B  08/31/2007    [place] Sabin, OTR/L  [phone] 07/31/2017, 12:03 PM  [facility] [address] [place], [name], [address] Phone: [phone]   Fax:  [phone]  Name: ILIYA SPIVACK MRN: [id number] Date of Birth: 1941-02-11

## 2017-08-03 ENCOUNTER — Encounter (HOSPITAL_COMMUNITY): Payer: Self-pay | Admitting: Specialist

## 2017-08-03 ENCOUNTER — Ambulatory Visit (HOSPITAL_COMMUNITY): Payer: Medicare Other | Admitting: Specialist

## 2017-08-03 DIAGNOSIS — M25611 Stiffness of right shoulder, not elsewhere classified: Secondary | ICD-10-CM

## 2017-08-03 DIAGNOSIS — R29898 Other symptoms and signs involving the musculoskeletal system: Secondary | ICD-10-CM | POA: Diagnosis not present

## 2017-08-03 DIAGNOSIS — M25511 Pain in right shoulder: Secondary | ICD-10-CM

## 2017-08-03 DIAGNOSIS — G8929 Other chronic pain: Secondary | ICD-10-CM

## 2017-08-03 NOTE — Therapy (Signed)
[facility] [place], [name], [address] Phone: [phone]   Fax:  [phone]  Occupational Therapy Treatment  Patient Details  Name: [name] [name] MRN: [id number] Date of Birth: 24-Oct-1940 Referring Provider: Dr. Corinna Capra   Encounter Date: 08/03/2017  OT End of Session - 08/03/17 1359    Visit Number  4    Number of Visits  8    Date for OT Re-Evaluation  08/22/17    Authorization Type  1) Medicare 2) [place] Time Period  before 10th visit    Authorization - Visit Number  4    Authorization - Number of Visits  10    OT Start Time  1304    OT Stop Time  1343    OT Time Calculation (min)  39 min    Activity Tolerance  Patient tolerated treatment well    Behavior During Therapy  [facility] for tasks assessed/performed       Past Medical History:  Diagnosis Date  . COPD with emphysema ([place])    Dr. Joya Gaskins, Gold stage C  . DJD (degenerative joint disease), lumbar   . History of prostate cancer    Status post radioactive seed implants  . History of shingles   . Incomplete emptying of bladder   . Mixed hyperlipidemia   . PAF (paroxysmal atrial fibrillation) ([place])   . Palpitations   . Peripheral neuropathy   . Sciatic nerve pain    Secondary to shingles 2011  . Urethral stricture   . Wears partial dentures    Upper and lower    Past Surgical History:  Procedure Laterality Date  . CATARACT EXTRACTION W/ INTRAOCULAR LENS  IMPLANT, BILATERAL  2012  . COLONOSCOPY  02-14-2003  . CYSTO/  BALLOON DILATION AND INCISION BLADDER NECK CONTRACTURE  X5  LAST ONE 01-24-2009  . CYSTOSCOPY WITH dilatation N/A 04/03/2014   Performed by Bernestine Amass, MD at [facility]  . CYSTOSCOPY WITH URETHRAL DILATATION N/A 09/15/2011   Performed by Bernestine Amass, MD at [facility]  . RADIOACTIVE PROSTATE SEED IMPLANTS/  CYSTO WITH BALLOON DILATION BLADDER NECK CONTRACTURE  11-26-2006     There were no vitals filed for this visit.  Subjective Assessment - 08/03/17 1358    Subjective   S:  I think therapy is helping and I dont think its ever going to be all the way ok.      Currently in Pain?  Yes    Pain Score  2     Pain Location  Shoulder    Pain Orientation  Right    Pain Descriptors / Indicators  Aching         [facility] OT Assessment - 08/03/17 0001      Assessment   Diagnosis  right shoulder bursitis      Precautions   Precautions  None               OT Treatments/Exercises (OP) - 08/03/17 0001      Exercises   Exercises  Shoulder      Shoulder Exercises: Supine   Protraction  PROM;5 reps;AAROM;15 reps    Horizontal ABduction  PROM;5 reps;AAROM;15 reps    External Rotation  PROM;5 reps;AAROM;15 reps    Internal Rotation  PROM;5 reps;AAROM;15 reps    Flexion  PROM;5 reps;AAROM;15 reps    ABduction  PROM;5 reps;AAROM;15 reps      Shoulder Exercises: Standing  Protraction  AAROM;10 reps    Horizontal ABduction  AAROM;10 reps    External Rotation  AAROM;10 reps    Internal Rotation  AAROM;10 reps    Flexion  AAROM;10 reps    ABduction  AAROM;10 reps    Extension  Theraband;10 reps    Theraband Level (Shoulder Extension)  Level 2 (Red)    Row  Theraband;10 reps    Theraband Level (Shoulder Row)  Level 2 (Red)    Retraction  Theraband;10 reps    Theraband Level (Shoulder Retraction)  Level 2 (Red)      Shoulder Exercises: ROM/Strengthening   UBE (Upper Arm Bike)  1.0 3 minutes in reverse    Wall Wash  1'    Proximal Shoulder Strengthening, Supine  10X each no rest breaks    Proximal Shoulder Strengthening, Seated  10 X each no rest breaks      Manual Therapy   Manual Therapy  Myofascial release    Manual therapy comments  manual therapy completed seperately from all other interventions this date    Myofascial Release  myofascial release and manual stretching to right upper arm, scapular, and shoulder region to decrease pain and  retrictions and improve pain free mobility in his right shoulder region.                 OT Short Term Goals - 07/27/17 1502      OT SHORT TERM GOAL #1   Title  Patient will be educated and independent with HEP to increase functional use of RUE during daily tasks.     Time  4    Period  Weeks    Status  On-going      OT SHORT TERM GOAL #2   Title  Patient will increase A/ROM of RUE to [facility] to increase ability to return to completing overhead activities with less difficulty.    Time  4    Period  Weeks    Status  On-going      OT SHORT TERM GOAL #3   Title  Patient will decrease pain level to 2/10 when using RUE for daily tasks.     Time  4    Period  Weeks    Status  On-going      OT SHORT TERM GOAL #4   Title  Patient will decrease fascial restrictions to min amount or less in RUE to increase functional reaching ability.    Time  4    Period  Weeks    Status  On-going               Plan - 08/03/17 1359    Clinical Impression Statement  A:  Patient completing exercises with good form, except for retraction with theraband, which requires max verbal guidance and minimal tactile cues.     OT Treatment/Interventions  Self-care/ADL training;Ultrasound;Cryotherapy;Electrical Stimulation;Moist Heat;Therapeutic activities;Therapeutic exercises;Manual Therapy;Passive range of motion;Patient/family education;DME and/or AE instruction    Plan  P:  Attempt A/ROM in standing.  Add ball on wall for improved scapular stability.       Patient will benefit from skilled therapeutic intervention in order to improve the following deficits and impairments:  Impaired UE functional use, Increased fascial restricitons, Decreased range of motion, Pain, Decreased strength  Visit Diagnosis: Other symptoms and signs involving the musculoskeletal system  Stiffness of right shoulder, not elsewhere classified  Chronic right shoulder pain    Problem List Patient Active Problem List    Diagnosis Date Noted  .  Multiple lung nodules on CT 05/14/2016  . COPD, severe ([place]) 02/19/2016  . Emphysema of lung ([place]) 02/19/2016  . Prostate cancer ([place])   . Mixed hyperlipidemia   . DJD (degenerative joint disease), lumbar   . Neuropathy   . Back pain   . 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 PALPITATIONS 02/19/2009  . HYPERLIPIDEMIA 08/31/2007  . ATRIAL FIBRILLATION, PAROXYSMAL 08/31/2007  . COPD with chronic bronchitis Clayborn Heron  08/31/2007    Vangie Bicker, [place], OTR/L [phone]  08/03/2017, 2:03 PM  [facility] [address] [place], [name], [address] Phone: [phone]   Fax:  [phone]  Name: [name] [name] MRN: [id number] Date of Birth: 02/21/1941

## 2017-08-05 ENCOUNTER — Ambulatory Visit (HOSPITAL_COMMUNITY): Payer: Medicare Other

## 2017-08-05 ENCOUNTER — Encounter (HOSPITAL_COMMUNITY): Payer: Self-pay

## 2017-08-05 DIAGNOSIS — R29898 Other symptoms and signs involving the musculoskeletal system: Secondary | ICD-10-CM

## 2017-08-05 DIAGNOSIS — M25611 Stiffness of right shoulder, not elsewhere classified: Secondary | ICD-10-CM

## 2017-08-05 DIAGNOSIS — M25511 Pain in right shoulder: Secondary | ICD-10-CM

## 2017-08-05 DIAGNOSIS — G8929 Other chronic pain: Secondary | ICD-10-CM

## 2017-08-05 NOTE — Therapy (Addendum)
[name], [name], [address] Phone: [phone]   Fax:  [phone]  Occupational Therapy Treatment  Patient Details  Name: [name] [name] MRN: [id number] Date of Birth: 1941-04-10 Referring Provider: Dr. Corinna Capra   Encounter Date: 08/05/2017  OT End of Session - 08/05/17 1503    Visit Number  5    Number of Visits  8    Date for OT Re-Evaluation  08/22/17    Authorization Type  1) Medicare 2) [place] Time Period  before 10th visit    Authorization - Visit Number  5    Authorization - Number of Visits  10    OT Start Time  9509    OT Stop Time  1430    OT Time Calculation (min)  38 min    Activity Tolerance  Patient tolerated treatment well    Behavior During Therapy  Flat affect       Past Medical History:  Diagnosis Date  . COPD with emphysema ([place])    Dr. Joya Gaskins, Gold stage C  . DJD (degenerative joint disease), lumbar   . History of prostate cancer    Status post radioactive seed implants  . History of shingles   . Incomplete emptying of bladder   . Mixed hyperlipidemia   . PAF (paroxysmal atrial fibrillation) ([place])   . Palpitations   . Peripheral neuropathy   . Sciatic nerve pain    Secondary to shingles 2011  . Urethral stricture   . Wears partial dentures    Upper and lower    Past Surgical History:  Procedure Laterality Date  . CATARACT EXTRACTION W/ INTRAOCULAR LENS  IMPLANT, BILATERAL  2012  . COLONOSCOPY  02-14-2003  . CYSTO/  BALLOON DILATION AND INCISION BLADDER NECK CONTRACTURE  X5  LAST ONE 01-24-2009  . CYSTOSCOPY WITH URETHRAL DILATATION  09/15/2011   Procedure: CYSTOSCOPY WITH URETHRAL DILATATION;  Surgeon: Bernestine Amass, MD;  Location: [facility];  Service: Urology;  Laterality: N/A;  CYSTOSCOPY, BALLOON DILATION   . CYSTOSCOPY WITH URETHRAL DILATATION N/A 04/03/2014   Procedure: CYSTOSCOPY WITH dilatation;  Surgeon: Bernestine Amass, MD;  Location: [facility];  Service: Urology;  Laterality: N/A;  . RADIOACTIVE PROSTATE SEED IMPLANTS/  CYSTO WITH BALLOON DILATION BLADDER NECK CONTRACTURE  11-26-2006    There were no vitals filed for this visit.  Subjective Assessment - 08/05/17 1419    Currently in Pain?  Yes    Pain Score  3     Pain Location  Shoulder    Pain Orientation  Right    Pain Descriptors / Indicators  Aching    Pain Type  Chronic pain           08/05/17 0001  Exercises  Exercises Shoulder  Shoulder Exercises: Supine  Protraction PROM;5 reps;AROM;10 reps  Horizontal ABduction PROM;5 reps;AROM;10 reps  External Rotation PROM;5 reps  Internal Rotation PROM;5 reps  Flexion PROM;5 reps  ABduction PROM;5 reps  Shoulder Exercises: Seated  Protraction AROM;10 reps  Horizontal ABduction AROM;10 reps  External Rotation AROM;10 reps  Internal Rotation AROM;10 reps  Flexion AROM;10 reps  Abduction AROM;10 reps  Shoulder Exercises: ROM/Strengthening  UBE (Upper Arm Bike) 1.0 3 minutes in reverse  Proximal Shoulder Strengthening, Seated 10 X each no rest breaks  Ball on Wall 1' flexion 1' abduction green ball  Manual Therapy  Manual Therapy Myofascial release  Manual therapy comments  manual therapy completed seperately from all other interventions this date  Myofascial Release myofascial release and manual stretching to right upper arm, scapular, and shoulder region to decrease pain and retrictions and improve pain free mobility in his right shoulder region.                 OT Short Term Goals - 07/27/17 1502      OT SHORT TERM GOAL #1   Title  Patient will be educated and independent with HEP to increase functional use of RUE during daily tasks.     Time  4    Period  Weeks    Status  On-going      OT SHORT TERM GOAL #2   Title  Patient will increase A/ROM of RUE to [facility] to increase ability to return to completing overhead activities with less difficulty.     Time  4    Period  Weeks    Status  On-going      OT SHORT TERM GOAL #3   Title  Patient will decrease pain level to 2/10 when using RUE for daily tasks.     Time  4    Period  Weeks    Status  On-going      OT SHORT TERM GOAL #4   Title  Patient will decrease fascial restrictions to min amount or less in RUE to increase functional reaching ability.    Time  4    Period  Weeks    Status  On-going               Plan - 08/05/17 1504    Clinical Impression Statement  A: Pt provided negative comments with every piece of education that was provided during session. No carry over noted. Added ball on the wall and complete all A/ROM standing this session. VC for initial form and technique.     Plan  P: Continue to focus on decreasing pain management and scapular stability. Patient is not concerned with ROM so I would D/C passive stretching.        Patient will benefit from skilled therapeutic intervention in order to improve the following deficits and impairments:  Impaired UE functional use, Increased fascial restricitons, Decreased range of motion, Pain, Decreased strength  Visit Diagnosis: Other symptoms and signs involving the musculoskeletal system  Stiffness of right shoulder, not elsewhere classified  Chronic right shoulder pain    Problem List Patient Active Problem List   Diagnosis Date Noted  . Multiple lung nodules on CT 05/14/2016  . COPD, severe ([place]) 02/19/2016  . Emphysema of lung ([place]) 02/19/2016  . Prostate cancer ([place])   . Mixed hyperlipidemia   . DJD (degenerative joint disease), lumbar   . Neuropathy   . Back pain   . 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 PALPITATIONS 02/19/2009  . HYPERLIPIDEMIA 08/31/2007  . ATRIAL FIBRILLATION, PAROXYSMAL 08/31/2007  . COPD with chronic bronchitis Gold B  08/31/2007   Ailene Ravel, OTR/L,CBIS  [phone]  08/05/2017, 3:11 PM  [place], [name], [address] Phone: [phone]   Fax:  [phone]  Name: [name] [name] MRN: [id number] Date of Birth: 07-05-1941

## 2017-08-12 ENCOUNTER — Emergency Department (HOSPITAL_COMMUNITY)
Admission: EM | Admit: 2017-08-12 | Discharge: 2017-08-12 | Disposition: A | Discharge status: Home / Self Care | Payer: Medicare Other | Attending: Emergency Medicine | Admitting: Emergency Medicine

## 2017-08-12 ENCOUNTER — Telehealth (HOSPITAL_COMMUNITY): Payer: Self-pay | Admitting: Internal Medicine

## 2017-08-12 ENCOUNTER — Other Ambulatory Visit: Payer: Self-pay

## 2017-08-12 ENCOUNTER — Emergency Department (HOSPITAL_COMMUNITY): Payer: Medicare Other

## 2017-08-12 ENCOUNTER — Encounter (HOSPITAL_COMMUNITY): Payer: Self-pay | Admitting: *Deleted

## 2017-08-12 ENCOUNTER — Ambulatory Visit (HOSPITAL_COMMUNITY): Payer: Medicare Other | Admitting: Occupational Therapy

## 2017-08-12 DIAGNOSIS — R0602 Shortness of breath: Secondary | ICD-10-CM | POA: Diagnosis present

## 2017-08-12 DIAGNOSIS — R0989 Other specified symptoms and signs involving the circulatory and respiratory systems: Secondary | ICD-10-CM | POA: Insufficient documentation

## 2017-08-12 DIAGNOSIS — Z79899 Other long term (current) drug therapy: Secondary | ICD-10-CM | POA: Diagnosis not present

## 2017-08-12 DIAGNOSIS — J441 Chronic obstructive pulmonary disease with (acute) exacerbation: Secondary | ICD-10-CM | POA: Diagnosis not present

## 2017-08-12 DIAGNOSIS — Z8546 Personal history of malignant neoplasm of prostate: Secondary | ICD-10-CM | POA: Diagnosis not present

## 2017-08-12 DIAGNOSIS — Z7982 Long term (current) use of aspirin: Secondary | ICD-10-CM | POA: Diagnosis not present

## 2017-08-12 DIAGNOSIS — Z87891 Personal history of nicotine dependence: Secondary | ICD-10-CM | POA: Insufficient documentation

## 2017-08-12 LAB — CBC
HCT: 45.9 % (ref 39.0–52.0)
Hemoglobin: 15.4 g/dL (ref 13.0–17.0)
MCH: 30.3 pg (ref 26.0–34.0)
MCHC: 33.6 g/dL (ref 30.0–36.0)
MCV: 90.4 fL (ref 78.0–100.0)
Platelets: 207 10*3/uL (ref 150–400)
RBC: 5.08 MIL/uL (ref 4.22–5.81)
RDW: 13.8 % (ref 11.5–15.5)
WBC: 10.1 10*3/uL (ref 4.0–10.5)

## 2017-08-12 LAB — HEPATIC FUNCTION PANEL
ALK PHOS: 62 U/L (ref 38–126)
ALT: 19 U/L (ref 17–63)
AST: 18 U/L (ref 15–41)
Albumin: 3.8 g/dL (ref 3.5–5.0)
BILIRUBIN DIRECT: 0.2 mg/dL (ref 0.1–0.5)
BILIRUBIN INDIRECT: 0.9 mg/dL (ref 0.3–0.9)
BILIRUBIN TOTAL: 1.1 mg/dL (ref 0.3–1.2)
TOTAL PROTEIN: 6.9 g/dL (ref 6.5–8.1)

## 2017-08-12 LAB — TROPONIN I

## 2017-08-12 LAB — BRAIN NATRIURETIC PEPTIDE: B Natriuretic Peptide: 29 pg/mL (ref 0.0–100.0)

## 2017-08-12 MED ORDER — ACETAMINOPHEN 500 MG PO TABS
1000.0000 mg | ORAL_TABLET | Freq: Once | ORAL | Status: AC
  Administered 2017-08-12: 1000 mg via ORAL

## 2017-08-12 MED ORDER — ACETAMINOPHEN 500 MG PO TABS
ORAL_TABLET | ORAL | Status: AC
  Administered 2017-08-12: 1000 mg via ORAL
  Filled 2017-08-12: qty 2

## 2017-08-12 MED ORDER — LEVALBUTEROL HCL 1.25 MG/0.5ML IN NEBU
1.2500 mg | INHALATION_SOLUTION | Freq: Once | RESPIRATORY_TRACT | Status: AC
  Administered 2017-08-12: 1.25 mg via RESPIRATORY_TRACT
  Filled 2017-08-12: qty 0.5

## 2017-08-12 MED ORDER — METHYLPREDNISOLONE SODIUM SUCC 125 MG IJ SOLR
125.0000 mg | Freq: Once | INTRAMUSCULAR | Status: AC
  Administered 2017-08-12: 125 mg via INTRAVENOUS
  Filled 2017-08-12: qty 2

## 2017-08-12 MED ORDER — PREDNISONE 20 MG PO TABS: ORAL_TABLET | ORAL | 0 refills | Status: DC

## 2017-08-12 NOTE — Telephone Encounter (Signed)
11/28/1wife called to cx no reason was [place]

## 2017-08-12 NOTE — ED Provider Notes (Signed)
[facility] EMERGENCY DEPARTMENT Provider Note   CSN: [id number] Arrival date & time: 08/12/17  1253     History   Chief Complaint Chief Complaint  Patient presents with  . Shortness of Breath    HPI JETT FUKUDA is a 76 y.o. male.  HPI Patient with history of COPD presents with progressive shortness of breath worsening over the last 4 days.  Of breath worsened with exertion.  States he has chest congestion but minimal cough.  No fever or chills.  Denies any chest pain.  No new lower extremity swelling or pain. Past Medical History:  Diagnosis Date  . COPD with emphysema ([place])    Dr. Joya Gaskins, Gold stage C  . DJD (degenerative joint disease), lumbar   . History of prostate cancer    Status post radioactive seed implants  . History of shingles   . Incomplete emptying of bladder   . Mixed hyperlipidemia   . PAF (paroxysmal atrial fibrillation) ([place])   . Palpitations   . Peripheral neuropathy   . Sciatic nerve pain    Secondary to shingles 2011  . Urethral stricture   . Wears partial dentures    Upper and lower    Patient Active Problem List   Diagnosis Date Noted  . Multiple lung nodules on CT 05/14/2016  . COPD, severe ([place]) 02/19/2016  . Emphysema of lung ([place]) 02/19/2016  . Prostate cancer ([place])   . Mixed hyperlipidemia   . DJD (degenerative joint disease), lumbar   . Neuropathy   . Back pain   . 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 PALPITATIONS 02/19/2009  . HYPERLIPIDEMIA 08/31/2007  . ATRIAL FIBRILLATION, PAROXYSMAL 08/31/2007  . COPD with chronic bronchitis Gold B  08/31/2007    Past Surgical History:  Procedure Laterality Date  . CATARACT EXTRACTION W/ INTRAOCULAR LENS  IMPLANT, BILATERAL  2012  . COLONOSCOPY  02-14-2003  . CYSTO/  BALLOON DILATION AND INCISION BLADDER NECK CONTRACTURE  X5  LAST ONE 01-24-2009  . CYSTOSCOPY WITH URETHRAL DILATATION  09/15/2011   Procedure: CYSTOSCOPY WITH URETHRAL DILATATION;  Surgeon: Bernestine Amass, MD;   Location: [facility];  Service: Urology;  Laterality: N/A;  CYSTOSCOPY, BALLOON DILATION   . CYSTOSCOPY WITH URETHRAL DILATATION N/A 04/03/2014   Procedure: CYSTOSCOPY WITH dilatation;  Surgeon: Bernestine Amass, MD;  Location: [facility];  Service: Urology;  Laterality: N/A;  . RADIOACTIVE PROSTATE SEED IMPLANTS/  CYSTO WITH BALLOON DILATION BLADDER NECK CONTRACTURE  11-26-2006       Home Medications    Prior to Admission medications   Medication Sig Start Date End Date Taking? Authorizing Provider  aspirin 81 MG tablet Take 81 mg by mouth 2 (two) times a week. Twice weekly   Yes [provider]  diltiazem (DILT-XR) 120 MG 24 hr capsule TAKE TWO CAPSULES BY MOUTH IN THE MORNING AND ONE CAPSULE IN THE EVENING -- **PLEASE KEEP UPCOMING APPOINTMENT FOR FUTURE REFILLS** 08/27/16  Yes Satira Sark, MD  LYRICA 150 MG capsule TAKE ONE CAPSULE BY MOUTH THREE TIMES DAILY 05/23/14  Yes Marcial Pacas, MD  Misc. Devices (ACAPELLA) MISC Use as directed 10/21/16  Yes Javier Glazier, MD  Oxycodone HCl 10 MG TABS Take 1 mg by mouth every 6 (six) hours as needed (pain).    Yes [provider]  PROAIR HFA 108 (90 Base) MCG/ACT inhaler INHALE TWO PUFFS BY MOUTH EVERY 6 HOURS AS NEEDED FOR WHEEZING OR SHORTNESS OF BREATH 06/20/16  Yes  Javier Glazier, MD  Spacer/Aero-Holding Chambers (AEROCHAMBER MV) inhaler Use as instructed 10/21/16  Yes Javier Glazier, MD  SPIRIVA RESPIMAT 2.5 MCG/ACT AERS INHALE TWO PUFFS BY MOUTH ONCE DAILY 12/19/16  Yes Javier Glazier, MD  SYMBICORT 160-4.5 MCG/ACT inhaler INHALE 2 PUFFS BY MOUTH TWICE DAILY 04/19/17  Yes Javier Glazier, MD  tamsulosin (FLOMAX) 0.4 MG CAPS capsule Take 1 capsule by mouth daily. 07/21/17  Yes [provider]  predniSONE (DELTASONE) 20 MG tablet 3 tabs po day one, then 2 po daily x 4 days 08/13/17   Julianne Rice, MD    Family History Family History  Problem Relation Age of Onset  .  COPD Mother   . Heart disease Brother     Social History Social History   Tobacco Use  . Smoking status: Former Smoker    Packs/day: 2.00    Years: 40.00    Pack years: 80.00    Types: Cigarettes    Start date: 06/19/1954    Last attempt to quit: 09/15/1998    Years since quitting: 18.9  . Smokeless tobacco: Never Used  . Tobacco comment: Counseled to remain smoke free  Substance Use Topics  . Alcohol use: No    Alcohol/week: 0.0 oz  . Drug use: No     Allergies   Albuterol   Review of Systems Review of Systems  Constitutional: Negative for chills and fever.  Respiratory: Positive for shortness of breath. Negative for cough and wheezing.   Cardiovascular: Negative for chest pain, palpitations and leg swelling.  Gastrointestinal: Negative for abdominal pain, diarrhea, nausea and vomiting.  Genitourinary: Negative for dysuria, flank pain, frequency and hematuria.  Musculoskeletal: Negative for myalgias and neck pain.  Skin: Negative for rash and wound.  Neurological: Negative for dizziness, weakness, light-headedness, numbness and headaches.  All other systems reviewed and are negative.    Physical Exam Updated Vital Signs BP 132/69   Pulse 72   Temp 98 F (36.7 C) (Oral)   Resp 17   Ht 5\' 10"  (1.778 m)   Wt 76.2 kg (168 lb)   SpO2 95%   BMI 24.11 kg/m   Physical Exam  Constitutional: He is oriented to person, place, and time. He appears well-developed and well-nourished.  Non-toxic appearance. He does not appear ill.  HENT:  Head: Normocephalic and atraumatic.  Mouth/Throat: Oropharynx is clear and moist.  Eyes: EOM are normal. Pupils are equal, round, and reactive to light.  Neck: Normal range of motion. Neck supple. No JVD present.  Cardiovascular: Normal rate and regular rhythm.  Pulmonary/Chest: Effort normal. He has decreased breath sounds. He has rales.  Diminished air movement throughout.  Crackles in bilateral bases.  Abdominal: Soft. Bowel sounds  are normal. There is no tenderness. There is no rebound and no guarding.  Musculoskeletal: Normal range of motion. He exhibits no edema or tenderness.       Right lower leg: He exhibits no tenderness and no edema.       Left lower leg: He exhibits no tenderness and no edema.  Neurological: He is alert and oriented to person, place, and time.  Skin: Skin is warm and dry. Capillary refill takes less than 2 seconds. No rash noted. No erythema.  Psychiatric: He has a normal mood and affect. His behavior is normal.  Nursing note and vitals reviewed.    ED Treatments / Results  Labs (all labs ordered are listed, but only abnormal results are displayed) Labs Reviewed  CBC  HEPATIC FUNCTION PANEL  TROPONIN I  BRAIN NATRIURETIC PEPTIDE    EKG  EKG Interpretation  Date/Time:  Wednesday August 12 2017 13:26:51 EST Ventricular Rate:  77 PR Interval:  146 QRS Duration: 90 QT Interval:  370 QTC Calculation: 418 R Axis:   58 Text Interpretation:  Normal sinus rhythm Normal ECG Confirmed by Julianne Rice [phone]) on 08/12/2017 2:11:52 PM       Radiology Dg Chest 2 View  Result Date: 08/12/2017 CLINICAL DATA:  SOB AND CHEST CONGESTION X 5 DAYS, COPD, FORMER SMOKER SINCE 2000 EXAM: CHEST  2 VIEW COMPARISON:  09/18/2016 FINDINGS: Lungs are hyperinflated. There is perihilar peribronchial thickening. No focal consolidations or pleural effusions. No pulmonary edema. IMPRESSION: Hyperinflation and bronchitic changes. No focal acute pulmonary abnormality. Electronically Signed   By: Nolon Nations M.D.   On: 08/12/2017 15:13    Procedures Procedures (including critical care time)  Medications Ordered in ED Medications  methylPREDNISolone sodium succinate (SOLU-MEDROL) 125 mg/2 mL injection 125 mg (125 mg Intravenous Given 08/12/17 1625)  levalbuterol (XOPENEX) nebulizer solution 1.25 mg (1.25 mg Nebulization Given 08/12/17 1550)     Initial Impression / Assessment and Plan / ED  Course  I have reviewed the triage vital signs and the nursing notes.  Pertinent labs & imaging results that were available during my care of the patient were reviewed by me and considered in my medical decision making (see chart for details).     Patient states he is feeling much better after nebulized treatment.  Will discharge with short course of prednisone.  Return precautions given.  Final Clinical Impressions(s) / ED Diagnoses   Final diagnoses:  COPD exacerbation [facility])    ED Discharge Orders        Ordered    predniSONE (DELTASONE) 20 MG tablet     08/12/17 1725       Julianne Rice, MD 08/12/17 1728

## 2017-08-12 NOTE — ED Triage Notes (Signed)
Pt c/o SOB and chest congestion x 5 days. Denies cough and fever. Hx of COPD.

## 2017-08-14 ENCOUNTER — Other Ambulatory Visit: Payer: Self-pay

## 2017-08-14 ENCOUNTER — Ambulatory Visit (HOSPITAL_COMMUNITY): Payer: Medicare Other | Admitting: Occupational Therapy

## 2017-08-14 ENCOUNTER — Encounter (HOSPITAL_COMMUNITY): Payer: Self-pay | Admitting: Occupational Therapy

## 2017-08-14 DIAGNOSIS — R29898 Other symptoms and signs involving the musculoskeletal system: Secondary | ICD-10-CM | POA: Diagnosis not present

## 2017-08-14 DIAGNOSIS — G8929 Other chronic pain: Secondary | ICD-10-CM

## 2017-08-14 DIAGNOSIS — M25511 Pain in right shoulder: Secondary | ICD-10-CM

## 2017-08-14 DIAGNOSIS — M25611 Stiffness of right shoulder, not elsewhere classified: Secondary | ICD-10-CM

## 2017-08-14 NOTE — Therapy (Signed)
[place], [name], [address] Phone: [phone]   Fax:  [phone]  Occupational Therapy Treatment  Patient Details  Name: [name] [name] MRN: [id number] Date of Birth: 09-Jul-1941 Referring Provider: Dr. Corinna Capra   Encounter Date: 08/14/2017  OT End of Session - 08/14/17 1557    Visit Number  6    Number of Visits  8    Date for OT Re-Evaluation  08/22/17    Authorization Type  1) Medicare 2) [place] Time Period  before 10th visit    Authorization - Visit Number  6    Authorization - Number of Visits  10    OT Start Time  3557    OT Stop Time  1515    OT Time Calculation (min)  42 min    Activity Tolerance  Patient tolerated treatment well    Behavior During Therapy  Flat affect       Past Medical History:  Diagnosis Date  . COPD with emphysema ([place])    Dr. Joya Gaskins, Gold stage C  . DJD (degenerative joint disease), lumbar   . History of prostate cancer    Status post radioactive seed implants  . History of shingles   . Incomplete emptying of bladder   . Mixed hyperlipidemia   . PAF (paroxysmal atrial fibrillation) ([place])   . Palpitations   . Peripheral neuropathy   . Sciatic nerve pain    Secondary to shingles 2011  . Urethral stricture   . Wears partial dentures    Upper and lower    Past Surgical History:  Procedure Laterality Date  . CATARACT EXTRACTION W/ INTRAOCULAR LENS  IMPLANT, BILATERAL  2012  . COLONOSCOPY  02-14-2003  . CYSTO/  BALLOON DILATION AND INCISION BLADDER NECK CONTRACTURE  X5  LAST ONE 01-24-2009  . CYSTOSCOPY WITH URETHRAL DILATATION  09/15/2011   Procedure: CYSTOSCOPY WITH URETHRAL DILATATION;  Surgeon: Bernestine Amass, MD;  Location: [facility];  Service: Urology;  Laterality: N/A;  CYSTOSCOPY, BALLOON DILATION   . CYSTOSCOPY WITH URETHRAL DILATATION N/A 04/03/2014   Procedure: CYSTOSCOPY WITH dilatation;  Surgeon: Bernestine Amass, MD;  Location: [facility];  Service: Urology;  Laterality: N/A;  . RADIOACTIVE PROSTATE SEED IMPLANTS/  CYSTO WITH BALLOON DILATION BLADDER NECK CONTRACTURE  11-26-2006    There were no vitals filed for this visit.  Subjective Assessment - 08/14/17 1433    Subjective   S: I don't really think this is helping any.     Currently in Pain?  Yes    Pain Score  2     Pain Location  Shoulder    Pain Orientation  Right    Pain Descriptors / Indicators  Aching;Sore    Pain Type  Chronic pain    Pain Radiating Towards  n/a    Pain Onset  More than a month ago    Pain Frequency  Intermittent    Aggravating Factors   certain movements    Pain Relieving Factors  rest, heating pad, pain relief cream, pain medication    Effect of Pain on Daily Activities  severe effect when painful    Multiple Pain Sites  No         [facility] OT Assessment - 08/14/17 1433      Assessment   Diagnosis  right shoulder bursitis      Precautions   Precautions  None  OT Treatments/Exercises (OP) - 08/14/17 1440      Exercises   Exercises  Shoulder      Shoulder Exercises: Supine   Protraction  AROM;10 reps    Horizontal ABduction  AROM;10 reps    External Rotation  AROM;10 reps    Internal Rotation  AROM;10 reps    Flexion  AROM;10 reps    ABduction  AROM;10 reps      Shoulder Exercises: Seated   Protraction  AROM;10 reps    Horizontal ABduction  AROM;10 reps    External Rotation  AROM;10 reps    Internal Rotation  AROM;10 reps    Flexion  AROM;10 reps    Abduction  AROM;10 reps      Shoulder Exercises: ROM/Strengthening   X to V Arms  10X each     Proximal Shoulder Strengthening, Supine  10X each no rest breaks    Ball on Wall  1' flexion 1' abduction green ball      Manual Therapy   Manual Therapy  Myofascial release    Manual therapy comments  manual therapy completed seperately from all other interventions this date    Myofascial Release   myofascial release and manual stretching to right upper arm, scapular, and shoulder region to decrease pain and retrictions and improve pain free mobility in his right shoulder region.                 OT Short Term Goals - 07/27/17 1502      OT SHORT TERM GOAL #1   Title  Patient will be educated and independent with HEP to increase functional use of RUE during daily tasks.     Time  4    Period  Weeks    Status  On-going      OT SHORT TERM GOAL #2   Title  Patient will increase A/ROM of RUE to [facility] to increase ability to return to completing overhead activities with less difficulty.    Time  4    Period  Weeks    Status  On-going      OT SHORT TERM GOAL #3   Title  Patient will decrease pain level to 2/10 when using RUE for daily tasks.     Time  4    Period  Weeks    Status  On-going      OT SHORT TERM GOAL #4   Title  Patient will decrease fascial restrictions to min amount or less in RUE to increase functional reaching ability.    Time  4    Period  Weeks    Status  On-going               Plan - 08/14/17 1501    Clinical Impression Statement  A: Pt reports he does not feel therapy is helping with the pain or catching that he is experiencing during certain movements, is planning to ask MD at appt next week what other options may be available. Discussed therapy and progression, pt reporting therapy is not making pain worse, however is not helping. Continued with A/ROM focusing on shoulder stability adding x to v arms, verbal cuing intermittently for form and technique.     Plan  P: continue with shoulder stability, add scapular theraband to HEP if pt completing with good form    OT Home Exercise Plan  11/8: AA/ROM shoulder exercises supine.    Consulted and Agree with Plan of Care  Patient  Patient will benefit from skilled therapeutic intervention in order to improve the following deficits and impairments:  Impaired UE functional use, Increased fascial  restrictions, Decreased range of motion, Pain, Decreased strength  Visit Diagnosis: Other symptoms and signs involving the musculoskeletal system  Stiffness of right shoulder, not elsewhere classified  Chronic right shoulder pain    Problem List Patient Active Problem List   Diagnosis Date Noted  . Multiple lung nodules on CT 05/14/2016  . COPD, severe ([place]) 02/19/2016  . Emphysema of lung ([place]) 02/19/2016  . Prostate cancer ([place])   . Mixed hyperlipidemia   . DJD (degenerative joint disease), lumbar   . Neuropathy   . Back pain   . 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 PALPITATIONS 02/19/2009  . HYPERLIPIDEMIA 08/31/2007  . ATRIAL FIBRILLATION, PAROXYSMAL 08/31/2007  . COPD with chronic bronchitis Gold B  08/31/2007   Guadelupe Sabin, OTR/L  [phone] 08/14/2017, 3:58 PM  [place], [name], [address] Phone: [phone]   Fax:  [phone]  Name: [name] [name] MRN: [id number] Date of Birth: June 28, 1941

## 2017-08-16 ENCOUNTER — Other Ambulatory Visit: Payer: Self-pay | Admitting: Cardiology

## 2017-08-17 ENCOUNTER — Ambulatory Visit (HOSPITAL_COMMUNITY): Payer: Medicare Other | Attending: Physical Medicine and Rehabilitation

## 2017-08-17 ENCOUNTER — Encounter (HOSPITAL_COMMUNITY): Payer: Self-pay

## 2017-08-17 DIAGNOSIS — M25611 Stiffness of right shoulder, not elsewhere classified: Secondary | ICD-10-CM | POA: Insufficient documentation

## 2017-08-17 DIAGNOSIS — G8929 Other chronic pain: Secondary | ICD-10-CM | POA: Insufficient documentation

## 2017-08-17 DIAGNOSIS — R29898 Other symptoms and signs involving the musculoskeletal system: Secondary | ICD-10-CM | POA: Insufficient documentation

## 2017-08-17 DIAGNOSIS — M25511 Pain in right shoulder: Secondary | ICD-10-CM | POA: Diagnosis present

## 2017-08-17 NOTE — Patient Instructions (Signed)

## 2017-08-17 NOTE — Therapy (Signed)
[place], [name], [address] Phone: [phone]   Fax:  [phone]  Occupational Therapy Treatment And reassessment Patient Details  Name: [name] [name] MRN: [id number] Date of Birth: July 02, 1941 Referring Provider: Dr. Corinna Capra   Encounter Date: 08/17/2017  OT End of Session - 08/17/17 1427    Visit Number  7    Number of Visits  8    Date for OT Re-Evaluation  08/22/17    Authorization Type  1) Medicare 2) [place] Time Period  before 17th visit    Authorization - Visit Number  7    Authorization - Number of Visits  17    OT Start Time  1304 reassessment    OT Stop Time  1340    OT Time Calculation (min)  36 min    Activity Tolerance  Patient tolerated treatment well    Behavior During Therapy  Flat affect       Past Medical History:  Diagnosis Date  . COPD with emphysema ([place])    Dr. Joya Gaskins, Gold stage C  . DJD (degenerative joint disease), lumbar   . History of prostate cancer    Status post radioactive seed implants  . History of shingles   . Incomplete emptying of bladder   . Mixed hyperlipidemia   . PAF (paroxysmal atrial fibrillation) ([place])   . Palpitations   . Peripheral neuropathy   . Sciatic nerve pain    Secondary to shingles 2011  . Urethral stricture   . Wears partial dentures    Upper and lower    Past Surgical History:  Procedure Laterality Date  . CATARACT EXTRACTION W/ INTRAOCULAR LENS  IMPLANT, BILATERAL  2012  . COLONOSCOPY  02-14-2003  . CYSTO/  BALLOON DILATION AND INCISION BLADDER NECK CONTRACTURE  X5  LAST ONE 01-24-2009  . CYSTOSCOPY WITH URETHRAL DILATATION  09/15/2011   Procedure: CYSTOSCOPY WITH URETHRAL DILATATION;  Surgeon: Bernestine Amass, MD;  Location: [facility];  Service: Urology;  Laterality: N/A;  CYSTOSCOPY, BALLOON DILATION   . CYSTOSCOPY WITH URETHRAL DILATATION N/A 04/03/2014   Procedure: CYSTOSCOPY WITH  dilatation;  Surgeon: Bernestine Amass, MD;  Location: [facility];  Service: Urology;  Laterality: N/A;  . RADIOACTIVE PROSTATE SEED IMPLANTS/  CYSTO WITH BALLOON DILATION BLADDER NECK CONTRACTURE  11-26-2006    There were no vitals filed for this visit.  Subjective Assessment - 08/17/17 1427    Subjective   S: Nothing has gotten better.     Currently in Pain?  Yes    Pain Score  4     Pain Location  Shoulder    Pain Orientation  Right    Pain Descriptors / Indicators  Aching;Sore    Pain Type  Chronic pain         [facility] OT Assessment - 08/17/17 1305      Assessment   Diagnosis  right shoulder bursitis    Onset Date  -- Spring 2018      Precautions   Precautions  None      Prior Function   Level of Independence  Independent      AROM   Overall AROM Comments  Assessed seated. IR/er abducted    AROM Assessment Site  Shoulder    Right/Left Shoulder  Right    Right Shoulder Flexion  125 Degrees previous: 101    Right Shoulder ABduction  130 Degrees previous: 140  Right Shoulder Internal Rotation  70 Degrees previous: 55    Right Shoulder External Rotation  90 Degrees previous: same      PROM   Overall PROM Comments  Assessed supine. IR/er abducted    PROM Assessment Site  Shoulder    Right/Left Shoulder  Right    Right Shoulder Flexion  123 Degrees previous: 145    Right Shoulder ABduction  119 Degrees previous: 119    Right Shoulder Internal Rotation  90 Degrees previous: 50    Right Shoulder External Rotation  60 Degrees previous: same      Strength   Overall Strength Comments  Assessed seated. IR/ir adducted    Strength Assessment Site  Shoulder    Right/Left Shoulder  Right    Right Shoulder Flexion  4/5 previous: same    Right Shoulder ABduction  4/5 previous: same    Right Shoulder Internal Rotation  4+/5 previous: same    Right Shoulder External Rotation  4+/5 previous: same               OT Treatments/Exercises (OP) - 08/17/17  1326      Exercises   Exercises  Shoulder      Shoulder Exercises: Standing   Extension  Theraband;10 reps    Theraband Level (Shoulder Extension)  Level 2 (Red)    Row  Theraband;10 reps    Theraband Level (Shoulder Row)  Level 2 (Red)    Retraction  Theraband;10 reps    Theraband Level (Shoulder Retraction)  Level 2 (Red)             OT Education - 08/17/17 1428    Education provided  Yes    Education Details  A/ROM exercises. patient given print out of measurements and strength testing to take to MD appointment.    Person(s) Educated  Patient    Methods  Explanation;Handout    Comprehension  Verbalized understanding       OT Short Term Goals - 08/17/17 1317      OT SHORT TERM GOAL #1   Title  Patient will be educated and independent with HEP to increase functional use of RUE during daily tasks.     Baseline  Pt is completing HEP 50% of the time.    Time  4    Period  Weeks    Status  Achieved      OT SHORT TERM GOAL #2   Title  Patient will increase A/ROM of RUE to [facility] to increase ability to return to completing overhead activities with less difficulty.    Time  4    Period  Weeks    Status  On-going      OT SHORT TERM GOAL #3   Title  Patient will decrease pain level to 2/10 when using RUE for daily tasks.     Time  4    Period  Weeks    Status  On-going      OT SHORT TERM GOAL #4   Title  Patient will decrease fascial restrictions to min amount or less in RUE to increase functional reaching ability.    Time  4    Period  Weeks    Status  Achieved               Plan - 08/17/17 1429    Clinical Impression Statement  A: Reassessment completed this date as patient has a follow up appointment with MD tomorrow. patient has met 2 short term goals  although reports that he has noticed no improvement with therapy. patient's P/ROM has either remained the same or lessened from evaluation. A/ROM measurements are slightly improved. Strength has remained the  same which is ok since we are not focused on increasing strength. At this point, it appears as though therapy is had no significant improvement in right shoulder. Pain is a limiting factor which prevents therapy from ranging his arm completely. I recommend that patient discharge from therapy although he wishes to see what the MD recommends for the next step of treatment.     Plan  P: I recommend discharge during reassessment. Patient would like to wait and see what recommendations the MD makes at his follow up visit on Wednesday. pt will call if he needs to be discharged after MD appointment. If patient returns we will need to send the MD a re-cert and do FOTO.    Consulted and Agree with Plan of Care  Patient       Patient will benefit from skilled therapeutic intervention in order to improve the following deficits and impairments:  Impaired UE functional use, Increased fascial restrictions, Decreased range of motion, Pain, Decreased strength  Visit Diagnosis: Other symptoms and signs involving the musculoskeletal system  Stiffness of right shoulder, not elsewhere classified  Chronic right shoulder pain  G-Codes - 24-Aug-2017 1432    Functional Assessment Tool Used (Outpatient only)  clinical judgement.     Functional Limitation  Carrying, moving and handling objects    Carrying, Moving and Handling Objects Current Status [phone])  At least 40 percent but less than 60 percent impaired, limited or restricted    Carrying, Moving and Handling Objects Goal Status (O3500)  At least 20 percent but less than 40 percent impaired, limited or restricted       Problem List Patient Active Problem List   Diagnosis Date Noted  . Multiple lung nodules on CT 05/14/2016  . COPD, severe ([place]) 02/19/2016  . Emphysema of lung ([place]) 02/19/2016  . Prostate cancer ([place])   . Mixed hyperlipidemia   . DJD (degenerative joint disease), lumbar   . Neuropathy   . Back pain   . 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 PALPITATIONS 02/19/2009  . HYPERLIPIDEMIA 08/31/2007  . ATRIAL FIBRILLATION, PAROXYSMAL 08/31/2007  . COPD with chronic bronchitis Gold B  08/31/2007   Ailene Ravel, OTR/L,CBIS  [phone]  24-Aug-2017, 2:34 PM  [place] [address] [place], [name], [address] Phone: [phone]   Fax:  [phone]  Name: [name] [name] MRN: [id number] Date of Birth: May 27, 1941

## 2017-08-19 ENCOUNTER — Encounter (HOSPITAL_COMMUNITY): Payer: Self-pay

## 2017-08-19 ENCOUNTER — Ambulatory Visit (HOSPITAL_COMMUNITY): Payer: Medicare Other

## 2017-08-19 DIAGNOSIS — R29898 Other symptoms and signs involving the musculoskeletal system: Secondary | ICD-10-CM | POA: Diagnosis not present

## 2017-08-19 DIAGNOSIS — M25511 Pain in right shoulder: Secondary | ICD-10-CM

## 2017-08-19 DIAGNOSIS — M25611 Stiffness of right shoulder, not elsewhere classified: Secondary | ICD-10-CM

## 2017-08-19 DIAGNOSIS — G8929 Other chronic pain: Secondary | ICD-10-CM

## 2017-08-19 NOTE — Therapy (Signed)
[place], [name], [address] Phone: [phone]   Fax:  [phone]  Occupational Therapy Treatment  Patient Details  Name: [name] [name] MRN: [id number] Date of Birth: 1941-03-14 Referring Provider: Dr. Corinna Capra   Encounter Date: 08/19/2017  OT End of Session - 08/19/17 1343    Visit Number  8    Number of Visits  8    Date for OT Re-Evaluation  08/22/17    Authorization Type  1) Medicare 2) [place] Time Period  before 17th visit    Authorization - Visit Number  8    Authorization - Number of Visits  17    OT Start Time  1300    OT Stop Time  1345    OT Time Calculation (min)  45 min    Activity Tolerance  Patient tolerated treatment well    Behavior During Therapy  Flat affect       Past Medical History:  Diagnosis Date  . COPD with emphysema ([place])    Dr. Joya Gaskins, Gold stage C  . DJD (degenerative joint disease), lumbar   . History of prostate cancer    Status post radioactive seed implants  . History of shingles   . Incomplete emptying of bladder   . Mixed hyperlipidemia   . PAF (paroxysmal atrial fibrillation) ([place])   . Palpitations   . Peripheral neuropathy   . Sciatic nerve pain    Secondary to shingles 2011  . Urethral stricture   . Wears partial dentures    Upper and lower    Past Surgical History:  Procedure Laterality Date  . CATARACT EXTRACTION W/ INTRAOCULAR LENS  IMPLANT, BILATERAL  2012  . COLONOSCOPY  02-14-2003  . CYSTO/  BALLOON DILATION AND INCISION BLADDER NECK CONTRACTURE  X5  LAST ONE 01-24-2009  . CYSTOSCOPY WITH URETHRAL DILATATION  09/15/2011   Procedure: CYSTOSCOPY WITH URETHRAL DILATATION;  Surgeon: Bernestine Amass, MD;  Location: [facility];  Service: Urology;  Laterality: N/A;  CYSTOSCOPY, BALLOON DILATION   . CYSTOSCOPY WITH URETHRAL DILATATION N/A 04/03/2014   Procedure: CYSTOSCOPY WITH dilatation;  Surgeon: Bernestine Amass, MD;  Location: [facility];  Service: Urology;  Laterality: N/A;  . RADIOACTIVE PROSTATE SEED IMPLANTS/  CYSTO WITH BALLOON DILATION BLADDER NECK CONTRACTURE  11-26-2006    There were no vitals filed for this visit.  Subjective Assessment - 08/19/17 1326    Subjective   S: The Doctor ordered tests so she can give me a shot in the right place. She said to come today and finish up and just do what feels ok.    Currently in Pain?  Yes    Pain Score  4     Pain Location  Shoulder    Pain Orientation  Right    Pain Descriptors / Indicators  Aching;Sore    Pain Type  Chronic pain                   OT Treatments/Exercises (OP) - 08/19/17 0001      Exercises   Exercises  Shoulder      Shoulder Exercises: Supine   Protraction  PROM;5 reps    Horizontal ABduction  PROM;5 reps    External Rotation  PROM;5 reps    Internal Rotation  PROM;5 reps    Flexion  PROM;5 reps    ABduction  PROM;5 reps  Shoulder Exercises: Standing   Extension  Theraband;10 reps    Theraband Level (Shoulder Extension)  Level 2 (Red)    Row  Theraband;10 reps    Theraband Level (Shoulder Row)  Level 2 (Red)    Retraction  Theraband;10 reps    Theraband Level (Shoulder Retraction)  Level 2 (Red)    Other Standing Exercises  Serratus anterior wall slide; 10X    Other Standing Exercises  Y arms lift off wall; 10X      Shoulder Exercises: ROM/Strengthening   UBE (Upper Arm Bike)  Level 1 2' reverse      Manual Therapy   Manual Therapy  Myofascial release    Manual therapy comments  manual therapy completed seperately from all other interventions this date    Myofascial Release  myofascial release and manual stretching to right upper arm, scapular, and shoulder region to decrease pain and retrictions and improve pain free mobility in his right shoulder region.                 OT Short Term Goals - 08/19/17 1347      OT SHORT TERM GOAL #1   Title  Patient will  be educated and independent with HEP to increase functional use of RUE during daily tasks.     Baseline  Pt is completing HEP 50% of the time.    Time  4    Period  Weeks      OT SHORT TERM GOAL #2   Title  Patient will increase A/ROM of RUE to [facility] to increase ability to return to completing overhead activities with less difficulty.    Time  4    Period  Weeks    Status  Achieved      OT SHORT TERM GOAL #3   Title  Patient will decrease pain level to 2/10 when using RUE for daily tasks.     Time  4    Period  Weeks    Status  Not Met      OT SHORT TERM GOAL #4   Title  Patient will decrease fascial restrictions to min amount or less in RUE to increase functional reaching ability.    Time  4    Period  Weeks               Plan - 08/19/17 1344    Clinical Impression Statement  A: Last session this date. Focused on postural strength and movement. Reviewed HEP and recommended that patient continue independently at home. Once he receives his shot he is to continue with his A/ROM exercises as he is able to tolerate.     Plan  P: D/C from therapy with HEP. Patient will follow up as needed.     Consulted and Agree with Plan of Care  Patient       Patient will benefit from skilled therapeutic intervention in order to improve the following deficits and impairments:  Impaired UE functional use, Increased fascial restrictions, Decreased range of motion, Pain, Decreased strength  Visit Diagnosis: Other symptoms and signs involving the musculoskeletal system  Stiffness of right shoulder, not elsewhere classified  Chronic right shoulder pain    Problem List Patient Active Problem List   Diagnosis Date Noted  . Multiple lung nodules on CT 05/14/2016  . COPD, severe ([place]) 02/19/2016  . Emphysema of lung ([place]) 02/19/2016  . Prostate cancer ([place])   . Mixed hyperlipidemia   . DJD (degenerative joint disease), lumbar   .  Neuropathy   . Back pain   . Bladder neck contracture  09/15/2011  . PALPITATIONS 02/19/2009  . HYPERLIPIDEMIA 08/31/2007  . ATRIAL FIBRILLATION, PAROXYSMAL 08/31/2007  . COPD with chronic bronchitis Gold B  08/31/2007    OCCUPATIONAL THERAPY DISCHARGE SUMMARY  Visits from Start of Care: 8  Current functional level related to goals / functional outcomes: patient's P/ROM has either remained the same or lessened from evaluation. A/ROM measurements are slightly improved. Strength has remained the same which is ok since we are not focused on increasing strength. At this point, it appears as though therapy is had no significant improvement in right shoulder. Pain is a limiting factor which prevents therapy from ranging his arm completely.   Remaining deficits: See above   Education / Equipment: A/ROM exercises Plan: Patient agrees to discharge.  Patient goals were partially met. Patient is being discharged due to lack of progress.  ?????         Ailene Ravel, OTR/L,CBIS  [phone]  08/19/2017, 2:13 PM  [place], [name], [address] Phone: [phone]   Fax:  [phone]  Name: [name] [name] MRN: [id number] Date of Birth: 06/26/1941

## 2017-09-02 ENCOUNTER — Ambulatory Visit (INDEPENDENT_AMBULATORY_CARE_PROVIDER_SITE_OTHER): Payer: Medicare Other | Admitting: Cardiology

## 2017-09-02 ENCOUNTER — Encounter: Payer: Self-pay | Admitting: Cardiology

## 2017-09-02 ENCOUNTER — Ambulatory Visit (HOSPITAL_COMMUNITY)
Admission: RE | Admit: 2017-09-02 | Discharge: 2017-09-02 | Disposition: A | Discharge status: Home / Self Care | Payer: Medicare Other | Source: Ambulatory Visit | Attending: Physical Medicine and Rehabilitation | Admitting: Physical Medicine and Rehabilitation

## 2017-09-02 ENCOUNTER — Other Ambulatory Visit (HOSPITAL_COMMUNITY): Payer: Self-pay | Admitting: Physical Medicine and Rehabilitation

## 2017-09-02 VITALS — BP 114/68 | HR 75 | Ht 70.0 in | Wt 167.0 lb

## 2017-09-02 DIAGNOSIS — J449 Chronic obstructive pulmonary disease, unspecified: Secondary | ICD-10-CM

## 2017-09-02 DIAGNOSIS — M25511 Pain in right shoulder: Secondary | ICD-10-CM | POA: Diagnosis present

## 2017-09-02 DIAGNOSIS — R52 Pain, unspecified: Secondary | ICD-10-CM

## 2017-09-02 DIAGNOSIS — I480 Paroxysmal atrial fibrillation: Secondary | ICD-10-CM | POA: Diagnosis not present

## 2017-09-02 MED ORDER — DILTIAZEM HCL ER 120 MG PO CP24: ORAL_CAPSULE | ORAL | 3 refills | Status: DC

## 2017-09-02 NOTE — Progress Notes (Signed)
Cardiology Office Note  Date: 09/02/2017   ID: Travius, Crochet 1941/04/07, MRN [id number]  PCP: Hilbert Corrigan, MD  Primary Cardiologist: Rozann Lesches, MD   Chief Complaint  Patient presents with  . Atrial Fibrillation    History of Present Illness: [name] [name] is a 76 y.o. male last seen in December 2017. He presents for a routine follow-up visit. Since last encounter he does not report any worsening palpitations or definitive recurrent atrial fibrillation. I reviewed his recent ECG from November.  He continues to follow in the Pulmonary division for management of COPD. He is using oxygen for only a limited time in the evening. Remains on MDIs.  CHADSVASC score is 3. He has not wanted to pursue anticoagulation. We have discussed at least continuing a baby aspirin daily. Otherwise, he continues on Cardizem CD 120 mg twice daily.  Past Medical History:  Diagnosis Date  . COPD with emphysema ([place])    Dr. Joya Gaskins, Gold stage C  . DJD (degenerative joint disease), lumbar   . History of prostate cancer    Status post radioactive seed implants  . History of shingles   . Incomplete emptying of bladder   . Mixed hyperlipidemia   . PAF (paroxysmal atrial fibrillation) ([place])   . Palpitations   . Peripheral neuropathy   . Sciatic nerve pain    Secondary to shingles 2011  . Urethral stricture   . Wears partial dentures    Upper and lower    Past Surgical History:  Procedure Laterality Date  . CATARACT EXTRACTION W/ INTRAOCULAR LENS  IMPLANT, BILATERAL  2012  . COLONOSCOPY  02-14-2003  . CYSTO/  BALLOON DILATION AND INCISION BLADDER NECK CONTRACTURE  X5  LAST ONE 01-24-2009  . CYSTOSCOPY WITH URETHRAL DILATATION  09/15/2011   Procedure: CYSTOSCOPY WITH URETHRAL DILATATION;  Surgeon: Bernestine Amass, MD;  Location: [facility];  Service: Urology;  Laterality: N/A;  CYSTOSCOPY, BALLOON DILATION   . CYSTOSCOPY WITH URETHRAL DILATATION N/A 04/03/2014     Procedure: CYSTOSCOPY WITH dilatation;  Surgeon: Bernestine Amass, MD;  Location: [facility];  Service: Urology;  Laterality: N/A;  . RADIOACTIVE PROSTATE SEED IMPLANTS/  CYSTO WITH BALLOON DILATION BLADDER NECK CONTRACTURE  11-26-2006    Current Outpatient Medications  Medication Sig Dispense Refill  . diltiazem (DILT-XR) 120 MG 24 hr capsule TAKE TWO CAPSULES BY MOUTH ONCE DAILY IN THE MORNING AND ONE IN THE EVENING 100 capsule 11  . LYRICA 150 MG capsule TAKE ONE CAPSULE BY MOUTH THREE TIMES DAILY 90 capsule 0  . Misc. Devices (ACAPELLA) MISC Use as directed 1 each 0  . Oxycodone HCl 10 MG TABS Take 1 mg by mouth every 6 (six) hours as needed (pain).     . predniSONE (DELTASONE) 20 MG tablet 3 tabs po day one, then 2 po daily x 4 days 11 tablet 0  . PROAIR HFA 108 (90 Base) MCG/ACT inhaler INHALE TWO PUFFS BY MOUTH EVERY 6 HOURS AS NEEDED FOR WHEEZING OR SHORTNESS OF BREATH 9 each 7  . Spacer/Aero-Holding Chambers (AEROCHAMBER MV) inhaler Use as instructed 1 each 0  . SPIRIVA RESPIMAT 2.5 MCG/ACT AERS INHALE TWO PUFFS BY MOUTH ONCE DAILY 3 Inhaler 3  . SYMBICORT 160-4.5 MCG/ACT inhaler INHALE 2 PUFFS BY MOUTH TWICE DAILY 1 Inhaler 5  . tamsulosin (FLOMAX) 0.4 MG CAPS capsule Take 1 capsule by mouth daily.     No current facility-administered medications for this visit.  Allergies:  Albuterol   Social History: The patient  reports that he quit smoking about 18 years ago. His smoking use included cigarettes. He started smoking about 63 years ago. He has a 80.00 pack-year smoking history. he has never used smokeless tobacco. He reports that he does not drink alcohol or use drugs.   ROS:  Please see the history of present illness. Otherwise, complete review of systems is positive for chronic dyspnea exertion, easy bruising.  All other systems are reviewed and negative.   Physical Exam: VS:  BP 114/68 (BP Location: Right Arm)   Pulse 75   Ht 5\' 10"  (1.778 m)   Wt 167 lb  (75.8 kg)   SpO2 95%   BMI 23.96 kg/m , BMI Body mass index is 23.96 kg/m.  Wt Readings from Last 3 Encounters:  09/02/17 167 lb (75.8 kg)  08/12/17 168 lb (76.2 kg)  06/23/17 163 lb (73.9 kg)    General: Patient appears comfortable at rest. HEENT: Conjunctiva and lids normal, oropharynx clear. Neck: Supple, no elevated JVP, no carotid bruits, no thyromegaly. Lungs: Decreased breath sounds without wheezing, nonlabored breathing at rest. Cardiac: Regular rate and rhythm, no S3 or significant systolic murmur, no pericardial rub. Abdomen: Soft, nontender, bowel sounds present. Extremities: No pitting edema, distal pulses 1-2+. Skin: Few ecchymoses on the forearms. Warm and dry. Musculoskeletal: No kyphosis. Neuropsychiatric: Alert and oriented x3, affect grossly appropriate.  ECG: I personally reviewed the tracing from 08/12/2017 which showed normal sinus rhythm.  Recent Labwork: 09/18/2016: BUN 11; Creatinine, Ser 1.16; Potassium 3.1; Sodium 137 08/12/2017: ALT 19; AST 18; B Natriuretic Peptide 29.0; Hemoglobin 15.4; Platelets 207   Assessment and Plan:  1. Paroxysmal atrial fibrillation with CHADSVASC score of 3. Continues to prefer low-dose aspirin to anticoagulation, otherwise we continue Cardizem CD. Recent ECG reviewed showing sinus rhythm. Continue with observation.  2. COPD, continues to follow with the Pulmonary division.  Current medicines were reviewed with the patient today.  Disposition: Follow-up in one year, sooner if needed.  Signed, Satira Sark, MD, [facility] 09/02/2017 2:13 PM    [facility] [address] [place], [place] [address] Phone: [phone]; Fax: [phone]

## 2017-09-02 NOTE — Patient Instructions (Signed)

## 2017-09-24 ENCOUNTER — Ambulatory Visit: Payer: Medicare Other | Admitting: Family Medicine

## 2017-10-06 ENCOUNTER — Other Ambulatory Visit: Payer: Self-pay

## 2017-10-06 ENCOUNTER — Ambulatory Visit (INDEPENDENT_AMBULATORY_CARE_PROVIDER_SITE_OTHER): Payer: Medicare Other | Admitting: Family Medicine

## 2017-10-06 ENCOUNTER — Encounter: Payer: Self-pay | Admitting: Family Medicine

## 2017-10-06 VITALS — BP 130/70 | HR 78 | Temp 98.4°F | Resp 16 | Ht 70.0 in | Wt 161.0 lb

## 2017-10-06 DIAGNOSIS — J449 Chronic obstructive pulmonary disease, unspecified: Secondary | ICD-10-CM

## 2017-10-06 DIAGNOSIS — E782 Mixed hyperlipidemia: Secondary | ICD-10-CM | POA: Diagnosis not present

## 2017-10-06 DIAGNOSIS — Z8546 Personal history of malignant neoplasm of prostate: Secondary | ICD-10-CM | POA: Diagnosis not present

## 2017-10-06 DIAGNOSIS — I480 Paroxysmal atrial fibrillation: Secondary | ICD-10-CM

## 2017-10-06 DIAGNOSIS — M4726 Other spondylosis with radiculopathy, lumbar region: Secondary | ICD-10-CM | POA: Diagnosis not present

## 2017-10-06 NOTE — Patient Instructions (Addendum)
Need old records Dr Lucianne Muss  No change in medicine Come back for a PE in 4-6 weeks Call sooner for problems  We will address the ultrasound aorta next visit

## 2017-10-06 NOTE — Progress Notes (Signed)
Chief Complaint  Patient presents with  . COPD   This is a new patient.  He has COPD, emphysema, chronic bronchitis.  He is under the care of pulmonary medicine.  He is compliant with Symbicort and Spiriva.  He uses albuterol frequently.  He has oxygen that he uses at night.  He states he ends up in the emergency room fairly often. He has lumbar degenerative disc disease.  Chronic low back pain.  Neuropathy in the legs.  He is under the care of pain management.  He takes oxycodone 2-4 times a day, Lyrica twice a day. Patient has a history of prostate cancer.  Treated with radiation seeds.  Successful for many years with a PSA of 0.  He has had some urinary straining thought to be due to stricture and has required dilation on a couple of occasions.  He is under the care of urology He has prior paroxysmal atrial fibrillation.  He refuses anticoagulation.  He is under the care of Dr. Domenic Polite cardiology.  He takes aspirin 81 mg a day, unless he notices excessive bruising, then he stops. He is up-to-date with immunization.  He believes he has had a tetanus in the last 10 years.  He had a colonoscopy. He has been screened for an aortic aneurysm.  He did have dilation.  He believes he is due for another ultrasound.  I will get his old records and check this.  Patient Active Problem List   Diagnosis Date Noted  . Multiple lung nodules on CT 05/14/2016  . COPD, severe ([place]) 02/19/2016  . Emphysema of lung ([place]) 02/19/2016  . Personal history of prostate cancer   . Mixed hyperlipidemia   . DJD (degenerative joint disease), lumbar   . Neuropathy   . Back pain   . 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 ATRIAL FIBRILLATION, PAROXYSMAL 08/31/2007    Outpatient Encounter Medications as of 10/06/2017  Medication Sig  . aspirin 81 MG chewable tablet Chew by mouth daily.  Marland Kitchen diltiazem (DILT-XR) 120 MG 24 hr capsule TAKE TWO CAPSULES BY MOUTH ONCE DAILY IN THE MORNING AND ONE IN THE EVENING  . Oxycodone  HCl 10 MG TABS Take 1 mg by mouth every 6 (six) hours as needed (pain).   Marland Kitchen PROAIR HFA 108 (90 Base) MCG/ACT inhaler INHALE TWO PUFFS BY MOUTH EVERY 6 HOURS AS NEEDED FOR WHEEZING OR SHORTNESS OF BREATH  . Spacer/Aero-Holding Chambers (AEROCHAMBER MV) inhaler Use as instructed  . SPIRIVA RESPIMAT 2.5 MCG/ACT AERS INHALE TWO PUFFS BY MOUTH ONCE DAILY  . SYMBICORT 160-4.5 MCG/ACT inhaler INHALE 2 PUFFS BY MOUTH TWICE DAILY  . LYRICA 150 MG capsule TAKE ONE CAPSULE BY MOUTH THREE TIMES DAILY (Patient not taking: Reported on 10/06/2017)  . Misc. Devices (ACAPELLA) MISC Use as directed (Patient not taking: Reported on 10/06/2017)   No facility-administered encounter medications on file as of 10/06/2017.     Past Medical History:  Diagnosis Date  . Cancer ([place])   . COPD with emphysema ([place])    Dr. Joya Gaskins, [place] stage C  . DJD (degenerative joint disease), lumbar   . Emphysema of lung ([place])   . History of prostate cancer    Status post radioactive seed implants  . History of shingles   . Incomplete emptying of bladder   . Mixed hyperlipidemia   . PAF (paroxysmal atrial fibrillation) ([place])   . Palpitations   . Peripheral neuropathy   . Sciatic nerve pain    Secondary to shingles  2011  . Urethral stricture   . Wears partial dentures    Upper and lower    Past Surgical History:  Procedure Laterality Date  . CATARACT EXTRACTION W/ INTRAOCULAR LENS  IMPLANT, BILATERAL  2012  . COLONOSCOPY  02-14-2003  . CYSTO/  BALLOON DILATION AND INCISION BLADDER NECK CONTRACTURE  X5  LAST ONE 01-24-2009  . CYSTOSCOPY WITH URETHRAL DILATATION  09/15/2011   Procedure: CYSTOSCOPY WITH URETHRAL DILATATION;  Surgeon: Bernestine Amass, MD;  Location: [facility];  Service: Urology;  Laterality: N/A;  CYSTOSCOPY, BALLOON DILATION   . CYSTOSCOPY WITH URETHRAL DILATATION N/A 04/03/2014   Procedure: CYSTOSCOPY WITH dilatation;  Surgeon: Bernestine Amass, MD;  Location: [facility];   Service: Urology;  Laterality: N/A;  . EYE SURGERY    . RADIOACTIVE PROSTATE SEED IMPLANTS/  CYSTO WITH BALLOON DILATION BLADDER NECK CONTRACTURE  11-26-2006    Social History   Socioeconomic History  . Marital status: Married    Spouse name: [name]  . Number of children: 3  . Years of education: 9 th  . Highest education level: Not on file  Social Needs  . Financial resource strain: Not hard at all  . Food insecurity - worry: Never true  . Food insecurity - inability: Never true  . Transportation needs - medical: No  . Transportation needs - non-medical: No  Occupational History    Employer: RETIRED    Comment: Retired  Tobacco Use  . Smoking status: Former Smoker    Packs/day: 2.00    Years: 40.00    Pack years: 80.00    Types: Cigarettes    Start date: 06/19/1954    Last attempt to quit: 09/15/1998    Years since quitting: 19.0  . Smokeless tobacco: Never Used  . Tobacco comment: Counseled to remain smoke free  Substance and Sexual Activity  . Alcohol use: No    Alcohol/week: 0.0 oz  . Drug use: No  . Sexual activity: No  Other Topics Concern  . Not on file  Social History Narrative   Patient lives at home with [name]). Patient is retired and has three daughters and one step daughter. Patient has a 9th grade education.   Right handed.   Caffeine- three  cups daily.      [facility]:   Originally from [place], [name]. Always lived in [name]. 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    Has a dog currently. No bird, mold, or asbestos exposure. 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     Family History  Problem Relation Age of Onset  . COPD Mother   . Heart disease Brother        died at 69    Review of Systems  Constitutional: Negative for chills, fever and weight loss.  HENT: Negative for congestion and hearing loss.   Eyes: Negative for blurred vision and pain.  Respiratory: Positive for  cough, shortness of breath and wheezing. Negative for hemoptysis and sputum production.   Cardiovascular: Negative for chest pain, palpitations and leg swelling.  Gastrointestinal: Negative for abdominal pain, constipation, diarrhea and heartburn.       Denies narcotic constipation  Genitourinary: Negative for dysuria and frequency.  Musculoskeletal: Positive for back pain. Negative for falls and myalgias.  Neurological: Positive for tingling and sensory change. Negative for dizziness, seizures and headaches.       Both legs with radiculopathy/neuropathy  Psychiatric/Behavioral: Negative for depression. The patient is not nervous/anxious and does not have insomnia.     BP 130/70 (BP Location: Left Arm, Patient Position: Sitting, Cuff Size: Normal)   Pulse 78   Temp 98.4 F (36.9 C) (Other (Comment))   Resp 16   Ht 5\' 10"  (1.778 m)   Wt 161 lb (73 kg)   SpO2 96%   BMI 23.10 kg/m   Physical Exam  Constitutional: He is oriented to person, place, and time. He appears well-developed and well-nourished.  HENT:  Head: Normocephalic and atraumatic.  Right Ear: External ear normal.  Left Ear: External ear normal.  Mouth/Throat: Oropharynx is clear and moist.  Eyes: Conjunctivae are normal. Pupils are equal, round, and reactive to light.  Neck: Normal range of motion. Neck supple. No thyromegaly present.  Cardiovascular: Normal rate, regular rhythm and normal heart sounds.  Pulmonary/Chest: Effort normal. No respiratory distress.  Decreased  Abdominal: Soft. Bowel sounds are normal.  Musculoskeletal: Normal range of motion. He exhibits no edema.  Lymphadenopathy:    He has no cervical adenopathy.  Neurological: He is alert and oriented to person, place, and time.  Gait normal  Skin: Skin is warm and dry.  Solar damage.  Multiple ecchymosis  Psychiatric: He has a normal mood and affect. His behavior is normal. Thought content normal.  Nursing note and vitals reviewed.   1. COPD  with chronic bronchitis and emphysema ([place]) Followed by pulmonary medicine 2.  Paroxysmal atrial fibrillation Under care of cardiology stable with medication.  Noncompliant with aspirin 3.  Osteoarthritis of spine Under care of pain management.  Chronic narcotics 4 mixed hyperlipidemia By history.  Not on medicine.  No cholesterol on record.  Will check old records 5.  Personal history of prostate cancer Treated, no recurrence  Patient Instructions  Need old records Dr Lucianne Muss  No change in medicine Come back for a PE in 4-6 weeks Call sooner for problems  We will address the ultrasound aorta next visit    [name], MD

## 2017-10-27 ENCOUNTER — Ambulatory Visit (INDEPENDENT_AMBULATORY_CARE_PROVIDER_SITE_OTHER): Payer: Medicare Other | Admitting: Family Medicine

## 2017-10-27 ENCOUNTER — Encounter: Payer: Self-pay | Admitting: Family Medicine

## 2017-10-27 ENCOUNTER — Other Ambulatory Visit (HOSPITAL_COMMUNITY)
Admission: RE | Admit: 2017-10-27 | Discharge: 2017-10-27 | Disposition: A | Discharge status: Home / Self Care | Payer: Medicare Other | Source: Ambulatory Visit | Attending: Family Medicine | Admitting: Family Medicine

## 2017-10-27 ENCOUNTER — Other Ambulatory Visit: Payer: Self-pay

## 2017-10-27 VITALS — BP 136/70 | HR 72 | Temp 98.1°F | Resp 16 | Ht 70.0 in | Wt 164.1 lb

## 2017-10-27 DIAGNOSIS — I480 Paroxysmal atrial fibrillation: Secondary | ICD-10-CM

## 2017-10-27 DIAGNOSIS — J449 Chronic obstructive pulmonary disease, unspecified: Secondary | ICD-10-CM

## 2017-10-27 DIAGNOSIS — I714 Abdominal aortic aneurysm, without rupture, unspecified: Secondary | ICD-10-CM

## 2017-10-27 DIAGNOSIS — E782 Mixed hyperlipidemia: Secondary | ICD-10-CM

## 2017-10-27 DIAGNOSIS — L280 Lichen simplex chronicus: Secondary | ICD-10-CM | POA: Diagnosis not present

## 2017-10-27 DIAGNOSIS — L728 Other follicular cysts of the skin and subcutaneous tissue: Secondary | ICD-10-CM | POA: Insufficient documentation

## 2017-10-27 DIAGNOSIS — J439 Emphysema, unspecified: Secondary | ICD-10-CM

## 2017-10-27 DIAGNOSIS — L989 Disorder of the skin and subcutaneous tissue, unspecified: Secondary | ICD-10-CM

## 2017-10-27 NOTE — Progress Notes (Signed)
Chief Complaint  Patient presents with  . Annual Exam  Patient is here for his annual examination. His medical care is up-to-date with the exception of shot.  He thinks he has had more than 10 years.  I am still waiting on his old records. I did screen his old imaging studies.  He was diagnosed at one time with an abdominal aortic aneurysm.  He states this is been followed,  but not for the last couple years.  He has no pain or problems.  He is due for another screening.  This is ordered today. I have also ordered screening blood work for him today with his other medical problems. He is worried about a lesion on his right forearm.  He states it started off with a sting by a yellow jacket.  He never got the stinger out.  It is getting bigger and bigger.  It continues to bother him.  It hurts when he sits his arm down on the table.  He would like to have it removed.  He thinks there is just a stinger in the middle of the lump, but it does not have any foreign body reaction, just induration like a dermatofibroma.  Is been present for several months.  Patient Active Problem List   Diagnosis Date Noted  . Abdominal aortic aneurysm (AAA) without rupture ([place]) 10/27/2017  . Multiple lung nodules on CT 05/14/2016  . COPD, severe ([place]) 02/19/2016  . Emphysema of lung ([place]) 02/19/2016  . Personal history of prostate cancer   . Mixed hyperlipidemia   . DJD (degenerative joint disease), lumbar   . Neuropathy   . Back pain   . 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 ATRIAL FIBRILLATION, PAROXYSMAL 08/31/2007    Outpatient Encounter Medications as of 10/27/2017  Medication Sig  . aspirin 81 MG chewable tablet Chew by mouth daily.  Marland Kitchen diltiazem (DILT-XR) 120 MG 24 hr capsule TAKE TWO CAPSULES BY MOUTH ONCE DAILY IN THE MORNING AND ONE IN THE EVENING  . LYRICA 150 MG capsule TAKE ONE CAPSULE BY MOUTH THREE TIMES DAILY  . Misc. Devices (ACAPELLA) MISC Use as directed  . Oxycodone HCl 10 MG TABS Take  1 mg by mouth every 6 (six) hours as needed (pain).   Marland Kitchen PROAIR HFA 108 (90 Base) MCG/ACT inhaler INHALE TWO PUFFS BY MOUTH EVERY 6 HOURS AS NEEDED FOR WHEEZING OR SHORTNESS OF BREATH  . Spacer/Aero-Holding Chambers (AEROCHAMBER MV) inhaler Use as instructed  . SPIRIVA RESPIMAT 2.5 MCG/ACT AERS INHALE TWO PUFFS BY MOUTH ONCE DAILY  . SYMBICORT 160-4.5 MCG/ACT inhaler INHALE 2 PUFFS BY MOUTH TWICE DAILY   No facility-administered encounter medications on file as of 10/27/2017.     Allergies  Allergen Reactions  . Albuterol Other (See Comments)    Caused extreme tachycardia. If used by continuous infusion.  May use the inhaler    Review of Systems  Constitutional: Negative for activity change, appetite change and fatigue.  HENT: Negative for congestion, dental problem and postnasal drip.   Eyes: Negative for photophobia and visual disturbance.  Respiratory: Positive for cough and shortness of breath.        Known COPD  Cardiovascular: Negative for chest pain, palpitations and leg swelling.  Gastrointestinal: Negative for abdominal distention and constipation.  Genitourinary: Negative for difficulty urinating and frequency.  Musculoskeletal: Positive for arthralgias and back pain.  Skin: Positive for wound.  Neurological: Negative for light-headedness and headaches.  Psychiatric/Behavioral: Negative for dysphoric mood and self-injury. The  patient is not nervous/anxious.     BP 136/70 (BP Location: Left Arm, Patient Position: Sitting, Cuff Size: Normal)   Pulse 72   Temp 98.1 F (36.7 C) (Temporal)   Resp 16   Ht 5\' 10"  (1.778 m)   Wt 164 lb 1.3 oz (74.4 kg)   SpO2 94%   BMI 23.54 kg/m   Physical Exam  BP 136/70 (BP Location: Left Arm, Patient Position: Sitting, Cuff Size: Normal)   Pulse 72   Temp 98.1 F (36.7 C) (Temporal)   Resp 16   Ht 5\' 10"  (1.778 m)   Wt 164 lb 1.3 oz (74.4 kg)   SpO2 94%   BMI 23.54 kg/m   General Appearance:    Alert, cooperative, no distress,  appears stated age  Head:    Normocephalic, without obvious abnormality, atraumatic  Eyes:    PERRL, conjunctiva/corneas clear, EOM's intact, fundi    benign, both eyes pseudophakia bilateral     Ears:    Normal TM's and external ear canals, both ears  Nose:   Nares normal, septum midline, mucosa normal, no drainage   or sinus tenderness  Throat:   Lips, mucosa, and tongue normal; teeth repaired, partials, and gums normal  Neck:   Supple, symmetrical, trachea midline, no adenopathy;       thyroid:  No enlargement/tenderness/nodules; no carotid   bruit   Back:     Symmetric, no curvature, ROM normal, no CVA tenderness  Lungs:     Clear to auscultation bilaterally, respirations unlabored.mildly diminished breath sounds  Chest wall:    No tenderness or deformity  Heart:    Regular rate and rhythm, S1 and S2 normal, no murmur, rub   or gallop  Abdomen:     Soft, non-tender, bowel sounds active all four quadrants,    no masses, no organomegaly  Genitalia:    Normal male without lesion, discharge or tenderness  Extremities:   Extremities normal, atraumatic, no cyanosis or edema  Pulses:   2+ and symmetric all extremities  Skin:   Skin color, texture, turgor normal, no rashes or lesions.  Forearm lesion described below  Lymph nodes:   Cervical, supraclavicular, and axillary nodes normal  Neurologic:   CNII-XII intact. Normal strength, sensation and reflexes      throughout  Addendum: There is a skin lesion palpable on the undersurface of the right forearm radial edge.  It is a hard rubbery not since approximately 12 mm across.  Punctate center with a small eschar.  Mild hyperpigmentation.  Nontender. Timeout Consent The area is cleansed with Betadine It is then anesthetized with 1% lidocaine, 2 cc I attempted to make an incision in the center, stab, with an 11 blade to see whether there is any purulence or foreign body.  With expiration it felt like a solid rubbery mass.  No fluctuance or  center noted.  I made an elliptical incision around the mass and removed it.  Close the wound with 4 interrupted 5-0 Prolene sutures.  Bleeding controlled with pressure.  Wound care discussed with patient.  He tolerated the procedure well.   ASSESSMENT/PLAN:  1. COPD with chronic bronchitis and emphysema ([place]) Managed by pulmonary.  Stable.   2. Skin lesion of right arm Clinically looks like a dermatofibroma.  Clinically he states it was started by bee sting.  We will send for pathology. - Surgical pathology  3. Abdominal aortic aneurysm (AAA) without rupture ([place]) - VAS [place] AAA DUPLEX; Future  4. Mixed  hyperlipidemia Lipid panel   5. Paroxysmal atrial fibrillation ([place]) Under care of cardiology.  Refuses anticoagulation. aSymptomatic. - CBC - COMPLETE METABOLIC PANEL WITH GFR - Lipid panel - Urinalysis, Routine w reflex microscopic   Patient Instructions  Keep clean and dry We need to check stitches in 2 days and remove them in 10 days (nurse) Need lab testing Ultrasound ordered     [name], MD

## 2017-10-27 NOTE — Patient Instructions (Signed)
Keep clean and dry We need to check stitches in 2 days and remove them in 10 days (nurse) Need lab testing Ultrasound ordered

## 2017-10-28 ENCOUNTER — Encounter: Payer: Self-pay | Admitting: Family Medicine

## 2017-10-28 LAB — CBC
HEMATOCRIT: 45.8 % (ref 38.5–50.0)
HEMOGLOBIN: 16.1 g/dL (ref 13.2–17.1)
MCH: 30.3 pg (ref 27.0–33.0)
MCHC: 35.2 g/dL (ref 32.0–36.0)
MCV: 86.3 fL (ref 80.0–100.0)
MPV: 9.5 fL (ref 7.5–12.5)
Platelets: 274 10*3/uL (ref 140–400)
RBC: 5.31 10*6/uL (ref 4.20–5.80)
RDW: 13.2 % (ref 11.0–15.0)
WBC: 14.4 10*3/uL — AB (ref 3.8–10.8)

## 2017-10-28 LAB — URINALYSIS, ROUTINE W REFLEX MICROSCOPIC
Bilirubin Urine: NEGATIVE
GLUCOSE, UA: NEGATIVE
HGB URINE DIPSTICK: NEGATIVE
Ketones, ur: NEGATIVE
LEUKOCYTES UA: NEGATIVE
Nitrite: NEGATIVE
PROTEIN: NEGATIVE
Specific Gravity, Urine: 1.008 (ref 1.001–1.03)
pH: 5.5 (ref 5.0–8.0)

## 2017-10-28 LAB — COMPLETE METABOLIC PANEL WITH GFR
AG Ratio: 1.3 (calc) (ref 1.0–2.5)
ALT: 21 U/L (ref 9–46)
AST: 19 U/L (ref 10–35)
Albumin: 4 g/dL (ref 3.6–5.1)
Alkaline phosphatase (APISO): 66 U/L (ref 40–115)
BUN: 15 mg/dL (ref 7–25)
CALCIUM: 9.6 mg/dL (ref 8.6–10.3)
CO2: 29 mmol/L (ref 20–32)
CREATININE: 1 mg/dL (ref 0.70–1.18)
Chloride: 102 mmol/L (ref 98–110)
GFR, EST NON AFRICAN AMERICAN: 73 mL/min/{1.73_m2} (ref >=60)
GFR, Est African American: 84 mL/min/{1.73_m2} (ref >=60)
GLOBULIN: 3 g/dL (ref 1.9–3.7)
Glucose, Bld: 73 mg/dL (ref 65–139)
Potassium: 4 mmol/L (ref 3.5–5.3)
SODIUM: 136 mmol/L (ref 135–146)
TOTAL PROTEIN: 7 g/dL (ref 6.1–8.1)
Total Bilirubin: 0.9 mg/dL (ref 0.2–1.2)

## 2017-10-28 LAB — LIPID PANEL
CHOL/HDL RATIO: 2.5 (calc) (ref <5.0)
Cholesterol: 154 mg/dL (ref <200)
HDL: 62 mg/dL (ref >40)
LDL Cholesterol (Calc): 78 mg/dL (calc)
NON-HDL CHOLESTEROL (CALC): 92 mg/dL (ref <130)
TRIGLYCERIDES: 64 mg/dL (ref <150)

## 2017-10-30 NOTE — Progress Notes (Signed)
Printed

## 2017-11-03 ENCOUNTER — Emergency Department (HOSPITAL_COMMUNITY): Payer: Medicare Other

## 2017-11-03 ENCOUNTER — Encounter (HOSPITAL_COMMUNITY): Payer: Self-pay | Admitting: Emergency Medicine

## 2017-11-03 ENCOUNTER — Emergency Department (HOSPITAL_COMMUNITY)
Admission: EM | Admit: 2017-11-03 | Discharge: 2017-11-03 | Disposition: A | Discharge status: Home / Self Care | Payer: Medicare Other | Attending: Emergency Medicine | Admitting: Emergency Medicine

## 2017-11-03 ENCOUNTER — Encounter: Payer: Self-pay | Admitting: Family Medicine

## 2017-11-03 ENCOUNTER — Other Ambulatory Visit: Payer: Self-pay

## 2017-11-03 ENCOUNTER — Ambulatory Visit (INDEPENDENT_AMBULATORY_CARE_PROVIDER_SITE_OTHER): Payer: Medicare Other | Admitting: Family Medicine

## 2017-11-03 VITALS — BP 118/66 | HR 112 | Temp 98.2°F | Resp 18 | Ht 70.0 in | Wt 158.0 lb

## 2017-11-03 DIAGNOSIS — Z79899 Other long term (current) drug therapy: Secondary | ICD-10-CM | POA: Diagnosis not present

## 2017-11-03 DIAGNOSIS — Z7982 Long term (current) use of aspirin: Secondary | ICD-10-CM | POA: Insufficient documentation

## 2017-11-03 DIAGNOSIS — Z87891 Personal history of nicotine dependence: Secondary | ICD-10-CM | POA: Insufficient documentation

## 2017-11-03 DIAGNOSIS — R0602 Shortness of breath: Secondary | ICD-10-CM | POA: Diagnosis present

## 2017-11-03 DIAGNOSIS — Z4889 Encounter for other specified surgical aftercare: Secondary | ICD-10-CM

## 2017-11-03 DIAGNOSIS — J441 Chronic obstructive pulmonary disease with (acute) exacerbation: Secondary | ICD-10-CM | POA: Diagnosis not present

## 2017-11-03 LAB — CBC WITH DIFFERENTIAL/PLATELET
Basophils Absolute: 0 10*3/uL (ref 0.0–0.1)
Basophils Relative: 0 %
EOS ABS: 0.3 10*3/uL (ref 0.0–0.7)
EOS PCT: 3 %
HCT: 45.1 % (ref 39.0–52.0)
Hemoglobin: 15.4 g/dL (ref 13.0–17.0)
LYMPHS ABS: 1.9 10*3/uL (ref 0.7–4.0)
LYMPHS PCT: 14 %
MCH: 30.6 pg (ref 26.0–34.0)
MCHC: 34.1 g/dL (ref 30.0–36.0)
MCV: 89.7 fL (ref 78.0–100.0)
MONO ABS: 1.8 10*3/uL — AB (ref 0.1–1.0)
Monocytes Relative: 13 %
Neutro Abs: 9.5 10*3/uL — ABNORMAL HIGH (ref 1.7–7.7)
Neutrophils Relative %: 70 %
PLATELETS: 192 10*3/uL (ref 150–400)
RBC: 5.03 MIL/uL (ref 4.22–5.81)
RDW: 14.2 % (ref 11.5–15.5)
WBC: 13.5 10*3/uL — ABNORMAL HIGH (ref 4.0–10.5)

## 2017-11-03 LAB — BASIC METABOLIC PANEL
Anion gap: 10 (ref 5–15)
BUN: 24 mg/dL — AB (ref 6–20)
CO2: 23 mmol/L (ref 22–32)
CREATININE: 1.25 mg/dL — AB (ref 0.61–1.24)
Calcium: 9 mg/dL (ref 8.9–10.3)
Chloride: 104 mmol/L (ref 101–111)
GFR calc Af Amer: >60 mL/min (ref >60)
GFR calc non Af Amer: 54 mL/min — ABNORMAL LOW (ref >60)
GLUCOSE: 94 mg/dL (ref 65–99)
Potassium: 4.3 mmol/L (ref 3.5–5.1)
SODIUM: 137 mmol/L (ref 135–145)

## 2017-11-03 MED ORDER — PREDNISONE 10 MG PO TABS: 10.0000 mg | ORAL_TABLET | Freq: Every day | ORAL | 0 refills | Status: DC

## 2017-11-03 MED ORDER — IPRATROPIUM BROMIDE 0.02 % IN SOLN
0.5000 mg | Freq: Once | RESPIRATORY_TRACT | Status: AC
Start: 2017-11-03 — End: 2017-11-03
  Administered 2017-11-03: 0.5 mg via RESPIRATORY_TRACT
  Filled 2017-11-03: qty 2.5

## 2017-11-03 MED ORDER — DEXAMETHASONE SODIUM PHOSPHATE 4 MG/ML IJ SOLN
8.0000 mg | Freq: Once | INTRAMUSCULAR | Status: AC
  Administered 2017-11-03: 8 mg via INTRAMUSCULAR
  Filled 2017-11-03: qty 2

## 2017-11-03 NOTE — Progress Notes (Signed)
Patient is here for suture removal. Pathology report is discussed with him.  Benign findings. Sutures were removed without difficulty and Steri-Strips are placed.  Patient mentions that he does not feel well today.  His oxygen level is low.  He short of breath.  He does not tolerate albuterol.  He is going to the emergency room for Xopenex treatment.  BP 118/66 (BP Location: Left Arm, Patient Position: Sitting, Cuff Size: Normal)   Pulse (!) 112   Temp 98.2 F (36.8 C) (Oral)   Resp 18   Ht 5\' 10"  (1.778 m)   Wt 158 lb 0.6 oz (71.7 kg)   SpO2 92%   BMI 22.68 kg/m    1.  Benign lesion removed, pathology report reviewed, wound care discussed 2.  COPD.  Refer to ER

## 2017-11-03 NOTE — ED Triage Notes (Signed)
Sob x 2 days. Told by Dr Meda Coffee to come to the ED.  Hx of copd

## 2017-11-03 NOTE — Discharge Instructions (Signed)
Chest x-ray showed no pneumonia.  Prescription for prednisone to start tomorrow.  Continue your home medications.  Return if worse.

## 2017-11-03 NOTE — ED Provider Notes (Signed)
[facility] EMERGENCY DEPARTMENT Provider Note   CSN: [id number] Arrival date & time: 11/03/17  1311     History   Chief Complaint Chief Complaint  Patient presents with  . Shortness of Breath    HPI [name] [name] is a 77 y.o. male.  Patient with a known history of COPD presents with worsening dyspnea for the past several days.  He has been using his Symbicort, Spiriva, albuterol inhaler at home.  He usually takes 2 L of nasal cannula oxygen for approximately 1 hour in the evening to sleep better.  He no longer smokes.  No prodromal respiratory infection.  Severity of symptoms is moderate.  Exertion makes symptoms worse.      Past Medical History:  Diagnosis Date  . Cancer ([place])   . COPD with emphysema ([place])    Dr. Joya Gaskins, Gold stage C  . DJD (degenerative joint disease), lumbar   . Emphysema of lung ([place])   . History of prostate cancer    Status post radioactive seed implants  . History of shingles   . Incomplete emptying of bladder   . Mixed hyperlipidemia   . PAF (paroxysmal atrial fibrillation) ([place])   . Palpitations   . Peripheral neuropathy   . Sciatic nerve pain    Secondary to shingles 2011  . Urethral stricture   . Wears partial dentures    Upper and lower    Patient Active Problem List   Diagnosis Date Noted  . Abdominal aortic aneurysm (AAA) without rupture ([place]) 10/27/2017  . Multiple lung nodules on CT 05/14/2016  . COPD, severe ([place]) 02/19/2016  . Emphysema of lung ([place]) 02/19/2016  . Personal history of prostate cancer   . Mixed hyperlipidemia   . DJD (degenerative joint disease), lumbar   . Neuropathy   . Back pain   . 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 ATRIAL FIBRILLATION, PAROXYSMAL 08/31/2007    Past Surgical History:  Procedure Laterality Date  . CATARACT EXTRACTION W/ INTRAOCULAR LENS  IMPLANT, BILATERAL  2012  . COLONOSCOPY  02-14-2003  . CYSTO/  BALLOON DILATION AND INCISION BLADDER NECK CONTRACTURE  X5  LAST ONE 01-24-2009    . CYSTOSCOPY WITH URETHRAL DILATATION  09/15/2011   Procedure: CYSTOSCOPY WITH URETHRAL DILATATION;  Surgeon: Bernestine Amass, MD;  Location[facility];  Service: Urology;  Laterality: N/A;  CYSTOSCOPY, BALLOON DILATION   . CYSTOSCOPY WITH URETHRAL DILATATION N/A 04/03/2014   Procedure: CYSTOSCOPY WITH dilatation;  Surgeon: Bernestine Amass, MD;  Location[facility];  Service: Urology;  Laterality: N/A;  . EYE SURGERY    . RADIOACTIVE PROSTATE SEED IMPLANTS/  CYSTO WITH BALLOON DILATION BLADDER NECK CONTRACTURE  11-26-2006       Home Medications    Prior to Admission medications   Medication Sig Start Date End Date Taking? Authorizing Provider  diltiazem (DILT-XR) 120 MG 24 hr capsule TAKE TWO CAPSULES BY MOUTH ONCE DAILY IN THE MORNING AND ONE IN THE EVENING 09/02/17  Yes Satira Sark, MD  LYRICA 150 MG capsule TAKE ONE CAPSULE BY MOUTH THREE TIMES DAILY 05/23/14  Yes Marcial Pacas, MD  Oxycodone HCl 10 MG TABS Take 1 mg by mouth every 6 (six) hours as needed (pain).    Yes [provider]  PROAIR HFA 108 (90 Base) MCG/ACT inhaler INHALE TWO PUFFS BY MOUTH EVERY 6 HOURS AS NEEDED FOR WHEEZING OR SHORTNESS OF BREATH 06/20/16  Yes Javier Glazier, MD  SPIRIVA RESPIMAT 2.5 MCG/ACT  AERS INHALE TWO PUFFS BY MOUTH ONCE DAILY 12/19/16  Yes Javier Glazier, MD  SYMBICORT 160-4.5 MCG/ACT inhaler INHALE 2 PUFFS BY MOUTH TWICE DAILY 04/19/17  Yes Javier Glazier, MD  aspirin 81 MG chewable tablet Chew by mouth daily.    [provider]  Misc. Devices (ACAPELLA) MISC Use as directed 10/21/16   Javier Glazier, MD  predniSONE (DELTASONE) 10 MG tablet Take 1 tablet (10 mg total) by mouth daily with breakfast. 3 tablets for 4 days, 2 tablets for 4 days, 1 tablet for 4 days. 11/03/17   Nat Christen, MD  Spacer/Aero-Holding Chambers (AEROCHAMBER MV) inhaler Use as instructed 10/21/16   Javier Glazier, MD    Family History Family History  Problem  Relation Age of Onset  . COPD Mother   . Heart disease Brother        died at 85    Social History Social History   Tobacco Use  . Smoking status: Former Smoker    Packs/day: 2.00    Years: 40.00    Pack years: 80.00    Types: Cigarettes    Start date: 06/19/1954    Last attempt to quit: 09/15/1998    Years since quitting: 19.1  . Smokeless tobacco: Never Used  . Tobacco comment: Counseled to remain smoke free  Substance Use Topics  . Alcohol use: No    Alcohol/week: 0.0 oz  . Drug use: No     Allergies   Albuterol   Review of Systems Review of Systems  All other systems reviewed and are negative.    Physical Exam Updated Vital Signs BP 135/86   Pulse (!) 108   Temp 98.5 F (36.9 C) (Oral)   Resp 20   SpO2 96%   Physical Exam  Constitutional: He is oriented to person, place, and time. He appears well-developed and well-nourished.  nad  HENT:  Head: Normocephalic and atraumatic.  Eyes: Conjunctivae are normal.  Neck: Neck supple.  Cardiovascular: Normal rate and regular rhythm.  Pulmonary/Chest: Effort normal and breath sounds normal.  Abdominal: Soft. Bowel sounds are normal.  Musculoskeletal: Normal range of motion.  Neurological: He is alert and oriented to person, place, and time.  Skin: Skin is warm and dry.  Psychiatric: He has a normal mood and affect. His behavior is normal.  Nursing note and vitals reviewed.    ED Treatments / Results  Labs (all labs ordered are listed, but only abnormal results are displayed) Labs Reviewed  BASIC METABOLIC PANEL - Abnormal; Notable for the following components:      Result Value   BUN 24 (*)    Creatinine, Ser 1.25 (*)    GFR calc non Af Amer 54 (*)    All other components within normal limits  CBC WITH DIFFERENTIAL/PLATELET - Abnormal; Notable for the following components:   WBC 13.5 (*)    Neutro Abs 9.5 (*)    Monocytes Absolute 1.8 (*)    All other components within normal limits    EKG  EKG  Interpretation None       Radiology Dg Chest 2 View  Result Date: 11/03/2017 CLINICAL DATA:  Shortness of breath. EXAM: CHEST  2 VIEW COMPARISON:  08/12/2017.  09/18/2016.  CT 04/22/2017. FINDINGS: Mediastinum and hilar structures normal. Lungs are clear of acute infiltrates. Tiny pulmonary nodules again appear to be present. Similar findings noted on prior CT of 04/22/2017. Reference is made to that report. No prominent pulmonary nodule or mass lesion noted.  No pleural effusion or pneumothorax. Heart size normal. Diffuse osteopenia degenerative change. IMPRESSION: 1.  No acute cardiopulmonary disease. 2. Stable tiny pulmonary nodules both lungs. Reference is made to prior CT report of 04/22/2017. Electronically Signed   By: Marcello Moores  Register   On: 11/03/2017 13:32    Procedures Procedures (including critical care time)  Medications Ordered in ED Medications  dexamethasone (DECADRON) injection 8 mg (8 mg Intramuscular Given 11/03/17 1916)  ipratropium (ATROVENT) nebulizer solution 0.5 mg (0.5 mg Nebulization Given 11/03/17 1902)     Initial Impression / Assessment and Plan / ED Course  I have reviewed the triage vital signs and the nursing notes.  Pertinent labs & imaging results that were available during my care of the patient were reviewed by me and considered in my medical decision making (see chart for details).     Patient with known history of COPD presents with worsening dyspnea.  He cannot take albuterol nebulizer treatments.  Chest x-ray negative for pneumonia.  Pulmonary nodule stable.  He responded well to an ipratropium nebulizer treatment and Decadron 8 mg IM.  He is stable respiratory wise at discharge.  Discharge medication prednisone taper.  Final Clinical Impressions(s) / ED Diagnoses   Final diagnoses:  COPD exacerbation [facility])    ED Discharge Orders        Ordered    predniSONE (DELTASONE) 10 MG tablet  Daily with breakfast     11/03/17 2023       Nat Christen, MD 11/03/17 2029

## 2017-11-05 ENCOUNTER — Other Ambulatory Visit: Payer: Self-pay | Admitting: Cardiology

## 2017-11-17 ENCOUNTER — Other Ambulatory Visit: Payer: Self-pay

## 2017-11-17 MED ORDER — BUDESONIDE-FORMOTEROL FUMARATE 160-4.5 MCG/ACT IN AERO: 2.0000 | INHALATION_SPRAY | Freq: Two times a day (BID) | RESPIRATORY_TRACT | 5 refills | Status: DC

## 2017-11-18 ENCOUNTER — Emergency Department (HOSPITAL_COMMUNITY)
Admission: EM | Admit: 2017-11-18 | Discharge: 2017-11-18 | Disposition: A | Discharge status: Home / Self Care | Payer: Medicare Other | Attending: Emergency Medicine | Admitting: Emergency Medicine

## 2017-11-18 ENCOUNTER — Other Ambulatory Visit: Payer: Self-pay

## 2017-11-18 ENCOUNTER — Encounter (HOSPITAL_COMMUNITY): Payer: Self-pay | Admitting: Emergency Medicine

## 2017-11-18 ENCOUNTER — Emergency Department (HOSPITAL_COMMUNITY): Payer: Medicare Other

## 2017-11-18 DIAGNOSIS — R0602 Shortness of breath: Secondary | ICD-10-CM | POA: Diagnosis present

## 2017-11-18 DIAGNOSIS — Z8546 Personal history of malignant neoplasm of prostate: Secondary | ICD-10-CM | POA: Diagnosis not present

## 2017-11-18 DIAGNOSIS — J441 Chronic obstructive pulmonary disease with (acute) exacerbation: Secondary | ICD-10-CM | POA: Insufficient documentation

## 2017-11-18 DIAGNOSIS — Z87891 Personal history of nicotine dependence: Secondary | ICD-10-CM | POA: Insufficient documentation

## 2017-11-18 LAB — COMPREHENSIVE METABOLIC PANEL
ALBUMIN: 3.4 g/dL — AB (ref 3.5–5.0)
ALK PHOS: 64 U/L (ref 38–126)
ALT: 17 U/L (ref 17–63)
AST: 17 U/L (ref 15–41)
Anion gap: 13 (ref 5–15)
BILIRUBIN TOTAL: 2.5 mg/dL — AB (ref 0.3–1.2)
BUN: 19 mg/dL (ref 6–20)
CO2: 21 mmol/L — AB (ref 22–32)
CREATININE: 1.18 mg/dL (ref 0.61–1.24)
Calcium: 8.5 mg/dL — ABNORMAL LOW (ref 8.9–10.3)
Chloride: 99 mmol/L — ABNORMAL LOW (ref 101–111)
GFR calc Af Amer: >60 mL/min (ref >60)
GFR calc non Af Amer: 58 mL/min — ABNORMAL LOW (ref >60)
GLUCOSE: 101 mg/dL — AB (ref 65–99)
Potassium: 3.8 mmol/L (ref 3.5–5.1)
SODIUM: 133 mmol/L — AB (ref 135–145)
TOTAL PROTEIN: 6.9 g/dL (ref 6.5–8.1)

## 2017-11-18 LAB — CBC WITH DIFFERENTIAL/PLATELET
BASOS ABS: 0 10*3/uL (ref 0.0–0.1)
Basophils Relative: 0 %
EOS ABS: 0.4 10*3/uL (ref 0.0–0.7)
EOS PCT: 3 %
HCT: 45.6 % (ref 39.0–52.0)
Hemoglobin: 15.3 g/dL (ref 13.0–17.0)
Lymphocytes Relative: 14 %
Lymphs Abs: 2.1 10*3/uL (ref 0.7–4.0)
MCH: 30.4 pg (ref 26.0–34.0)
MCHC: 33.6 g/dL (ref 30.0–36.0)
MCV: 90.5 fL (ref 78.0–100.0)
MONO ABS: 2.1 10*3/uL — AB (ref 0.1–1.0)
MONOS PCT: 14 %
Neutro Abs: 10 10*3/uL — ABNORMAL HIGH (ref 1.7–7.7)
Neutrophils Relative %: 69 %
PLATELETS: 242 10*3/uL (ref 150–400)
RBC: 5.04 MIL/uL (ref 4.22–5.81)
RDW: 14.7 % (ref 11.5–15.5)
WBC: 14.5 10*3/uL — ABNORMAL HIGH (ref 4.0–10.5)

## 2017-11-18 LAB — I-STAT TROPONIN, ED: TROPONIN I, POC: 0 ng/mL (ref 0.00–0.08)

## 2017-11-18 LAB — BRAIN NATRIURETIC PEPTIDE: B NATRIURETIC PEPTIDE 5: 40 pg/mL (ref 0.0–100.0)

## 2017-11-18 MED ORDER — IPRATROPIUM BROMIDE 0.02 % IN SOLN
0.5000 mg | Freq: Once | RESPIRATORY_TRACT | Status: AC
  Administered 2017-11-18: 0.5 mg via RESPIRATORY_TRACT
  Filled 2017-11-18: qty 2.5

## 2017-11-18 MED ORDER — SODIUM CHLORIDE 0.9 % IV BOLUS (SEPSIS)
500.0000 mL | Freq: Once | INTRAVENOUS | Status: AC
  Administered 2017-11-18: 500 mL via INTRAVENOUS

## 2017-11-18 MED ORDER — IPRATROPIUM-ALBUTEROL 0.5-2.5 (3) MG/3ML IN SOLN: 3.0000 mL | Freq: Once | RESPIRATORY_TRACT | Status: DC

## 2017-11-18 MED ORDER — ALBUTEROL SULFATE HFA 108 (90 BASE) MCG/ACT IN AERS: 1.0000 | INHALATION_SPRAY | Freq: Four times a day (QID) | RESPIRATORY_TRACT | 0 refills | Status: DC | PRN

## 2017-11-18 MED ORDER — AZITHROMYCIN 250 MG PO TABS: 250.0000 mg | ORAL_TABLET | Freq: Every day | ORAL | 0 refills | Status: DC

## 2017-11-18 MED ORDER — METHYLPREDNISOLONE SODIUM SUCC 125 MG IJ SOLR
125.0000 mg | Freq: Once | INTRAMUSCULAR | Status: AC
  Administered 2017-11-18: 125 mg via INTRAVENOUS
  Filled 2017-11-18: qty 2

## 2017-11-18 MED ORDER — PREDNISONE 10 MG PO TABS: 30.0000 mg | ORAL_TABLET | Freq: Every day | ORAL | 0 refills | Status: AC

## 2017-11-18 NOTE — Discharge Instructions (Signed)
We believe that your symptoms are caused today by an exacerbation of your COPD, and possibly bronchitis.  Please take the prescribed medications and any medications that you have at home for your COPD.  Follow up with your doctor as recommended.  If you develop any new or worsening symptoms, including but not limited to fever, persistent vomiting, worsening shortness of breath, or other symptoms that concern you, please return to the Emergency Department immediately. ° ° °Chronic Obstructive Pulmonary Disease °Chronic obstructive pulmonary disease (COPD) is a common lung condition in which airflow from the lungs is limited. COPD is a general term that can be used to describe many different lung problems that limit airflow, including both chronic bronchitis and emphysema.  If you have COPD, your lung function will probably never return to normal, but there are measures you can take to improve lung function and make yourself feel better.  °CAUSES  °Smoking (common).   °Exposure to secondhand smoke.   °Genetic problems. °Chronic inflammatory lung diseases or recurrent infections. °SYMPTOMS  °Shortness of breath, especially with physical activity.   °Deep, persistent (chronic) cough with a large amount of thick mucus.   °Wheezing.   °Rapid breaths (tachypnea).   °Gray or bluish discoloration (cyanosis) of the skin, especially in fingers, toes, or lips.   °Fatigue.   °Weight loss.   °Frequent infections or episodes when breathing symptoms become much worse (exacerbations).   °Chest tightness. °DIAGNOSIS  °Your health care provider will take a medical history and perform a physical examination to make the initial diagnosis.  Additional tests for COPD may include:  °Lung (pulmonary) function tests. °Chest X-ray. °CT scan. °Blood tests. °TREATMENT  °Treatment available to help you feel better when you have COPD includes:  °Inhaler and nebulizer medicines. These help manage the symptoms of COPD and make your breathing more  comfortable. °Supplemental oxygen. Supplemental oxygen is only helpful if you have a low oxygen level in your blood.   °Exercise and physical activity. These are beneficial for nearly all people with COPD. Some people may also benefit from a pulmonary rehabilitation program. °HOME CARE INSTRUCTIONS  °Take all medicines (inhaled or pills) as directed by your health care provider. °Avoid over-the-counter medicines or cough syrups that dry up your airway (such as antihistamines) and slow down the elimination of secretions unless instructed otherwise by your health care provider.   °If you are a smoker, the most important thing that you can do is stop smoking. Continuing to smoke will cause further lung damage and breathing trouble. Ask your health care provider for help with quitting smoking. He or she can direct you to community resources or hospitals that provide support. °Avoid exposure to irritants such as smoke, chemicals, and fumes that aggravate your breathing. °Use oxygen therapy and pulmonary rehabilitation if directed by your health care provider. If you require home oxygen therapy, ask your health care provider whether you should purchase a pulse oximeter to measure your oxygen level at home.   °Avoid contact with individuals who have a contagious illness. °Avoid extreme temperature and humidity changes. °Eat healthy foods. Eating smaller, more frequent meals and resting before meals may help you maintain your strength. °Stay active, but balance activity with periods of rest. Exercise and physical activity will help you maintain your ability to do things you want to do. °Preventing infection and hospitalization is very important when you have COPD. Make sure to receive all the vaccines your health care provider recommends, especially the pneumococcal and influenza   vaccines. Ask your health care provider whether you need a pneumonia vaccine. °Learn and use relaxation techniques to manage stress. °Learn and  use controlled breathing techniques as directed by your health care provider. Controlled breathing techniques include:   °Pursed lip breathing. Start by breathing in (inhaling) through your nose for 1 second. Then, purse your lips as if you were going to whistle and breathe out (exhale) through the pursed lips for 2 seconds.   °Diaphragmatic breathing. Start by putting one hand on your abdomen just above your waist. Inhale slowly through your nose. The hand on your abdomen should move out. Then purse your lips and exhale slowly. You should be able to feel the hand on your abdomen moving in as you exhale.   °Learn and use controlled coughing to clear mucus from your lungs. Controlled coughing is a series of short, progressive coughs. The steps of controlled coughing are:   °Lean your head slightly forward.   °Breathe in deeply using diaphragmatic breathing.   °Try to hold your breath for 3 seconds.   °Keep your mouth slightly open while coughing twice.   °Spit any mucus out into a tissue.   °Rest and repeat the steps once or twice as needed. °SEEK MEDICAL CARE IF:  °You are coughing up more mucus than usual.   °There is a change in the color or thickness of your mucus.   °Your breathing is more labored than usual.   °Your breathing is faster than usual.   °SEEK IMMEDIATE MEDICAL CARE IF:  °You have shortness of breath while you are resting.   °You have shortness of breath that prevents you from: °Being able to talk.   °Performing your usual physical activities.   °You have chest pain lasting longer than 5 minutes.   °Your skin color is more cyanotic than usual. °You measure low oxygen saturations for longer than 5 minutes with a pulse oximeter. °MAKE SURE YOU:  °Understand these instructions. °Will watch your condition. °Will get help right away if you are not doing well or get worse. °Document Released: 06/11/2005 Document Revised: 01/16/2014 Document Reviewed: 04/28/2013 °[employer]® Patient Information ©2015  [employer]. This information is not intended to replace advice given to you by your health care provider. Make sure you discuss any questions you have with your health care provider. ° °

## 2017-11-18 NOTE — ED Notes (Signed)
Have paged respiratory  

## 2017-11-18 NOTE — ED Provider Notes (Signed)
Emergency Department Provider Note   I have reviewed the triage vital signs and the nursing notes.   HISTORY  Chief Complaint Shortness of Breath   HPI [name] [name] is a 77 y.o. male with PMH of COPD, PAF, and peripheral neuropathy presents to the emergency department for evaluation of increased shortness of breath starting 2 days ago.  He has a history of COPD.  He has noticed some chest/nasal congestion but denies any fevers or shaking chills.  He has been compliant with his home COPD medications and has been using his rescue albuterol inhaler approximately every hour.  Symptoms are worse with exertion.  He denies any chest pain.  He does have some discomfort in the left armpit that is focal and nonradiating.  This began yesterday and has been constant. No modifying factors for the pain.   Past Medical History:  Diagnosis Date  . Cancer ([place])   . COPD with emphysema ([place])    Dr. Joya Gaskins, Gold stage C  . DJD (degenerative joint disease), lumbar   . Emphysema of lung ([place])   . History of prostate cancer    Status post radioactive seed implants  . History of shingles   . Incomplete emptying of bladder   . Mixed hyperlipidemia   . PAF (paroxysmal atrial fibrillation) ([place])   . Palpitations   . Peripheral neuropathy   . Sciatic nerve pain    Secondary to shingles 2011  . Urethral stricture   . Wears partial dentures    Upper and lower    Patient Active Problem List   Diagnosis Date Noted  . Abdominal aortic aneurysm (AAA) without rupture ([place]) 10/27/2017  . Multiple lung nodules on CT 05/14/2016  . COPD, severe ([place]) 02/19/2016  . Emphysema of lung ([place]) 02/19/2016  . Personal history of prostate cancer   . Mixed hyperlipidemia   . DJD (degenerative joint disease), lumbar   . Neuropathy   . Back pain   . 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 ATRIAL FIBRILLATION, PAROXYSMAL 08/31/2007    Past Surgical History:  Procedure Laterality Date  . CATARACT EXTRACTION W/  INTRAOCULAR LENS  IMPLANT, BILATERAL  2012  . COLONOSCOPY  02-14-2003  . CYSTO/  BALLOON DILATION AND INCISION BLADDER NECK CONTRACTURE  X5  LAST ONE 01-24-2009  . CYSTOSCOPY WITH URETHRAL DILATATION  09/15/2011   Procedure: CYSTOSCOPY WITH URETHRAL DILATATION;  Surgeon: Bernestine Amass, MD;  Location: [facility];  Service: Urology;  Laterality: N/A;  CYSTOSCOPY, BALLOON DILATION   . CYSTOSCOPY WITH URETHRAL DILATATION N/A 04/03/2014   Procedure: CYSTOSCOPY WITH dilatation;  Surgeon: Bernestine Amass, MD;  Location: [facility];  Service: Urology;  Laterality: N/A;  . EYE SURGERY    . RADIOACTIVE PROSTATE SEED IMPLANTS/  CYSTO WITH BALLOON DILATION BLADDER NECK CONTRACTURE  11-26-2006    Current Outpatient Rx  . Order #: [id number] Class: Historical Med  . Order #: [id number] Class: Normal  . Order #: [id number] Class: Normal  . Order #: [id number] Class: Print  . Order #: [id number] Class: Historical Med  . Order #: [id number] Class: Normal  . Order #: [id number] Class: Normal  . Order #: [id number] Class: Print  . Order #: [id number] Class: Print  . Order #: [id number] Class: Print  . [START ON 11/19/2017] Order #: [id number] Class: Print  . Order #: [id number] Class: Print    Allergies Albuterol  Family History  Problem Relation Age of Onset  . COPD Mother   . Heart disease Brother  died at 20    Social History Social History   Tobacco Use  . Smoking status: Former Smoker    Packs/day: 2.00    Years: 40.00    Pack years: 80.00    Types: Cigarettes    Start date: 06/19/1954    Last attempt to quit: 09/15/1998    Years since quitting: 19.1  . Smokeless tobacco: Never Used  . Tobacco comment: Counseled to remain smoke free  Substance Use Topics  . Alcohol use: No    Alcohol/week: 0.0 oz  . Drug use: No    Review of Systems  Constitutional: No fever/chills. Positive fatigue.  Eyes: No visual changes. ENT: No sore throat. Cardiovascular: Denies chest  pain. Respiratory: Positive shortness of breath and congestion.  Gastrointestinal: No abdominal pain. Positive nausea, no vomiting.  No diarrhea.  No constipation. Genitourinary: Negative for dysuria. Musculoskeletal: Negative for back pain. Skin: Negative for rash. Neurological: Negative for headaches, focal weakness or numbness.  10-point ROS otherwise negative.  ____________________________________________   PHYSICAL EXAM:  VITAL SIGNS: ED Triage Vitals  Enc Vitals Group     BP 11/18/17 1532 90/63     Pulse Rate 11/18/17 1532 72     Resp 11/18/17 1532 17     Temp 11/18/17 1532 98.6 F (37 C)     Temp Source 11/18/17 1532 Oral     SpO2 11/18/17 1532 96 %     Weight 11/18/17 1533 165 lb (74.8 kg)     Height 11/18/17 1533 5\' 10"  (1.778 m)     Pain Score 11/18/17 1532 3   Constitutional: Alert and oriented. Well appearing and in no acute distress. Eyes: Conjunctivae are normal.  Head: Atraumatic. Nose: No congestion/rhinnorhea. Mouth/Throat: Mucous membranes are moist. Neck: No stridor. Cardiovascular: Normal rate, regular rhythm. Good peripheral circulation. Grossly normal heart sounds.   Respiratory: Slight increased respiratory effort.  No retractions. Lungs with bilateral end-expiratory wheezing. Gastrointestinal: Soft and nontender. No distention.  Musculoskeletal: No lower extremity tenderness nor edema. No gross deformities of extremities. Neurologic:  Normal speech and language. No gross focal neurologic deficits are appreciated.  Skin:  Skin is warm, dry and intact. No rash noted.  ____________________________________________   LABS (all labs ordered are listed, but only abnormal results are displayed)  Labs Reviewed  COMPREHENSIVE METABOLIC PANEL - Abnormal; Notable for the following components:      Result Value   Sodium 133 (*)    Chloride 99 (*)    CO2 21 (*)    Glucose, Bld 101 (*)    Calcium 8.5 (*)    Albumin 3.4 (*)    Total Bilirubin 2.5 (*)      GFR calc non Af Amer 58 (*)    All other components within normal limits  CBC WITH DIFFERENTIAL/PLATELET - Abnormal; Notable for the following components:   WBC 14.5 (*)    Neutro Abs 10.0 (*)    Monocytes Absolute 2.1 (*)    All other components within normal limits  BRAIN NATRIURETIC PEPTIDE  I-STAT TROPONIN, ED   ____________________________________________  EKG   EKG Interpretation  Date/Time:  Wednesday November 18 2017 15:35:11 EST Ventricular Rate:  112 PR Interval:  144 QRS Duration: 72 QT Interval:  312 QTC Calculation: 425 R Axis:   60 Text Interpretation:  Sinus tachycardia Otherwise normal ECG No STEMI.  Confirmed by Nanda Quinton [phone]) on 11/18/2017 7:31:44 PM       ____________________________________________  RADIOLOGY  Dg Chest 2 View  Result Date:  11/18/2017 CLINICAL DATA:  77 year old male with shortness of breath for the past day EXAM: CHEST - 2 VIEW COMPARISON:  Prior chest x-ray 11/03/2016 FINDINGS: Stable cardiac and mediastinal contours. Trace atherosclerotic calcifications present in the transverse aorta. The lungs remain hyperinflated. Increased retrosternal clear space on the lateral view. Stable calcified granuloma in the left lower lobe. No pneumothorax, pleural effusion or pulmonary edema. No acute osseous abnormality. IMPRESSION: 1. Pulmonary hyperinflation suggests underlying COPD. 2. No acute cardiopulmonary process. 3.  Aortic Atherosclerosis (ICD10-170.0) Electronically Signed   By: Jacqulynn Cadet M.D.   On: 11/18/2017 15:51    ____________________________________________   PROCEDURES  Procedure(s) performed:   Procedures  None  ____________________________________________   INITIAL IMPRESSION / ASSESSMENT AND PLAN / ED COURSE  Pertinent labs & imaging results that were available during my care of the patient were reviewed by me and considered in my medical decision making (see chart for details).  Patient presents to the  emergency department for evaluation of difficulty breathing with mild cough and chest congestion.  Does have mild tachycardia on arrival but he has been using his albuterol inhaler frequently.  He reports that when he gets the nebulized albuterol he has significant tachycardia that is uncomfortable to him and wishes to avoid this medication if possible.  He has no chest pain specifically but is complaining of some left axillary discomfort that is nonradiating.  This could be a possible anginal equivalent but have low suspicion for this.  He has been to the emergency department multiple times with COPD exacerbation symptoms and this seems similar to prior episodes.  EKG shows no evidence of acute ischemia.  Will follow troponin and reassess after ipratropium neb.   Labs and EKG along with CXR reviewed. No acute findings. Patient feeling much better after single neb and steroids. Plan to continue inh at home (patient reports having no problems with inh albuterol) and steroid burst. Patient to f/u with PCP regarding med changes.   At this time, I do not feel there is any life-threatening condition present. I have reviewed and discussed all results (EKG, imaging, lab, urine as appropriate), exam findings with patient. I have reviewed nursing notes and appropriate previous records.  I feel the patient is safe to be discharged home without further emergent workup. Discussed usual and customary return precautions. Patient and family (if present) verbalize understanding and are comfortable with this plan.  Patient will follow-up with their primary care provider. If they do not have a primary care provider, information for follow-up has been provided to them. All questions have been answered.  ____________________________________________  FINAL CLINICAL IMPRESSION(S) / ED DIAGNOSES  Final diagnoses:  COPD exacerbation ([place])     MEDICATIONS GIVEN DURING THIS VISIT:  Medications  sodium chloride 0.9 % bolus  500 mL (0 mLs Intravenous Stopped 11/18/17 2045)  ipratropium (ATROVENT) nebulizer solution 0.5 mg (0.5 mg Nebulization Given 11/18/17 2025)  methylPREDNISolone sodium succinate (SOLU-MEDROL) 125 mg/2 mL injection 125 mg (125 mg Intravenous Given 11/18/17 1935)     NEW OUTPATIENT MEDICATIONS STARTED DURING THIS VISIT:  Discharge Medication List as of 11/18/2017 10:10 PM    START taking these medications   Details  !! albuterol (PROVENTIL HFA;VENTOLIN HFA) 108 (90 Base) MCG/ACT inhaler Inhale 1-2 puffs into the lungs every 6 (six) hours as needed for wheezing or shortness of breath., Starting Wed 11/18/2017, Print    azithromycin (ZITHROMAX) 250 MG tablet Take 1 tablet (250 mg total) by mouth daily. Take first 2 tablets together,  then 1 every day until finished., Starting Wed 11/18/2017, Print     !! - Potential duplicate medications found. Please discuss with provider.      Note:  This document was prepared using Dragon voice recognition software and may include unintentional dictation errors.  Nanda Quinton, MD Emergency Medicine    Long, [facility], MD 11/18/17 [phone]

## 2017-11-18 NOTE — ED Triage Notes (Signed)
Pt reports increased SOB that began yesterday afternoon. Hx of COPD.

## 2017-11-20 ENCOUNTER — Ambulatory Visit (INDEPENDENT_AMBULATORY_CARE_PROVIDER_SITE_OTHER): Payer: Medicare Other | Admitting: Emergency Medicine

## 2017-11-20 ENCOUNTER — Ambulatory Visit (INDEPENDENT_AMBULATORY_CARE_PROVIDER_SITE_OTHER): Payer: Medicare Other | Admitting: *Deleted

## 2017-11-20 ENCOUNTER — Encounter: Payer: Self-pay | Admitting: Emergency Medicine

## 2017-11-20 VITALS — BP 132/70 | HR 72 | Ht 70.0 in | Wt 157.0 lb

## 2017-11-20 DIAGNOSIS — R911 Solitary pulmonary nodule: Secondary | ICD-10-CM

## 2017-11-20 DIAGNOSIS — J449 Chronic obstructive pulmonary disease, unspecified: Secondary | ICD-10-CM

## 2017-11-20 MED ORDER — ALBUTEROL SULFATE (2.5 MG/3ML) 0.083% IN NEBU: 2.5000 mg | INHALATION_SOLUTION | RESPIRATORY_TRACT | 5 refills | Status: DC | PRN

## 2017-11-20 NOTE — Patient Instructions (Addendum)
Please continue your Spiriva and Symbicort as you are taking them  Keep albuterol inhaler available to use 2 puffs up to every 4 hours if needed for shortness of breath, wheezing, secretion clearance We will order albuterol nebulizer treatments.  You can use these up to every 4 hours if needed for shortness of breath, wheezing. Please start Mucinex 600 mg twice a day Depending on how you progress we may decide to consider starting a medication called Daliresp at some point in the future. We will repeat your CT scan of the chest without contrast in August 2019 Follow with Dr Lamonte Sakai in August 2019 after your CT scan to review the results together.

## 2017-11-20 NOTE — Assessment & Plan Note (Signed)
He has frequent exacerbations that are typically characterized by inability to clear secretions and then associated dyspnea and wheezing.  He has not benefited from alternative scheduled bronchodilators in the past and therefore has remained on Spiriva and Symbicort.  We will continue these.  I will add Mucinex to his regimen.  He also believes that he may get better delivery of nebulized albuterol (when he goes to the emergency department) very we will arrange for him to have a nebulizer at home so that he has access to this as well as his HFA.

## 2017-11-20 NOTE — Progress Notes (Signed)
SIX MIN WALK 11/20/2017 12/24/2016 10/21/2016 02/19/2016 05/15/2014 01/12/2013  Medications Oxycodone 10mg  and Lyrica 150mg  both taken at 6:00a symbicort 160, ocycodone 10mg  and lyrica 150mg  all taken at 7am - Albuterol 2 puffs @ 6am; Diltiazem 240mg , Breo 100(1puff), Spiriva 2.86mcg(2puff) and Lyrica 150mg  ALL @ 630am - -  Supplimental Oxygen during Test? (L/min) No No No No No No  Laps 6 8 - 7 - -  Partial Lap (in Meters) 12 0 - 11 - -  Baseline BP (sitting) 118/58 124/70 - 128/60 - -  Baseline Heartrate 78 65 - 67 - -  Baseline Dyspnea (Borg Scale) 5 6 - 2 - -  Baseline Fatigue (Borg Scale) 5 6 - 2 - -  Baseline SPO2 96 100 - 94 - -  BP (sitting) 120/62 128/72 - 146/64 - -  Heartrate 92 89 - 79 - -  Dyspnea (Borg Scale) 7 6 - 2 - -  Fatigue (Borg Scale) 7 8 - 1 - -  SPO2 91 96 - 93 - -  BP (sitting) 110/56 126/72 - 132/70 - -  Heartrate 84 69 - 72 - -  SPO2 98 97 - 95 - -  Stopped or Paused before Six Minutes No No - No - -  Interpretation Leg pain Leg pain - - - -  Distance Completed 300 384 - 347 - -  Tech Comments: pt completed test at slow pace with complaints of leg pain but states this is normal with this amount of exertion.  no desats no desats-pt walked at moderate pace with forehead probe. pt c/o leg pain due to back injury. Pt. walked a slow pace, did seem alittle unsteady on his feet, stopped after two laps due to de-stats, pt. did express some dyspnea after laps Pt completed walk at a moderate pace. No complaints. --AMG Walk stopped after 2nd lap per pt's request - c/o SOB.  Lowest o2 sat during walk 97% RA. -

## 2017-11-20 NOTE — Assessment & Plan Note (Signed)
Multiple scattered nodules with splenic calcifications consistent with probable granulomatous disease.  He needs a follow-up scan in August 2019 to look for interval change, this especially in the setting of a history of prostate cancer.

## 2017-11-20 NOTE — Progress Notes (Signed)
Subjective:    Patient ID: [name] [name], male    DOB: 1940-10-21, 77 y.o.   MRN: [id number]  HPI 77 year old former smoker (76 pack years) with a history of boxes mitral fibrillation, prostate cancer, followed here most recently by Dr. Ashok Cordia for severe COPD and bilateral pulmonary nodules that have been tracked by serial CT scans.  He has had frequent exacerbations and has been seen in the emergency room at least 3 times since he was seen in this office. He reports chest congestion that he has difficulty clearing. Last was 11/18/17 > received pred and azithro. CXR that visit negative.   He uses albuterol about 2-3x a week when he is at baseline. Denies any nasal gtt or congestion. He can walk through the store, minimal exertion. Comfortable when still.  Sx are worst when the weather changes.   Most recent CT chest was 04/22/17 and I have reviewed.  This showed multiple bilateral pulmonary nodules of various sizes all less than 86mm, with a new small nodule noted in the right lower lobe in the left upper lobe.    Review of Systems  Past Medical History:  Diagnosis Date  . Cancer ([place])   . COPD with emphysema ([place])    Dr. Joya Gaskins, Gold stage C  . DJD (degenerative joint disease), lumbar   . Emphysema of lung ([place])   . History of prostate cancer    Status post radioactive seed implants  . History of shingles   . Incomplete emptying of bladder   . Mixed hyperlipidemia   . PAF (paroxysmal atrial fibrillation) ([place])   . Palpitations   . Peripheral neuropathy   . Sciatic nerve pain    Secondary to shingles 2011  . Urethral stricture   . Wears partial dentures    Upper and lower     Family History  Problem Relation Age of Onset  . COPD Mother   . Heart disease Brother        died at 11     Social History   Socioeconomic History  . Marital status: Married    Spouse name: [name]  . Number of children: 3  . Years of education: 9 th  . Highest education level: Not on file    Social Needs  . Financial resource strain: Not hard at all  . Food insecurity - worry: Never true  . Food insecurity - inability: Never true  . Transportation needs - medical: No  . Transportation needs - non-medical: No  Occupational History    Employer: RETIRED    Comment: Retired  Tobacco Use  . Smoking status: Former Smoker    Packs/day: 2.00    Years: 40.00    Pack years: 80.00    Types: Cigarettes    Start date: 06/19/1954    Last attempt to quit: 09/15/1998    Years since quitting: 19.1  . Smokeless tobacco: Never Used  . Tobacco comment: Counseled to remain smoke free  Substance and Sexual Activity  . Alcohol use: No    Alcohol/week: 0.0 oz  . Drug use: No  . Sexual activity: No  Other Topics Concern  . Not on file  Social History Narrative   Patient lives at home with [name]). Patient is retired and has three daughters and one step daughter. Patient has a 9th grade education.   Right handed.   Caffeine- three  cups daily.      [facility]:   Originally from [place], [name]. Always lived in  [place]. 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    Has a dog currently. No bird, mold, or asbestos exposure. 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      Allergies  Allergen Reactions  . Albuterol Other (See Comments)    Caused extreme tachycardia. If used by continuous infusion.  May use the inhaler     Outpatient Medications Prior to Visit  Medication Sig Dispense Refill  . albuterol (PROVENTIL HFA;VENTOLIN HFA) 108 (90 Base) MCG/ACT inhaler Inhale 1-2 puffs into the lungs every 6 (six) hours as needed for wheezing or shortness of breath. 1 Inhaler 0  . aspirin 81 MG chewable tablet Chew 81 mg by mouth daily as needed (heart).     Marland Kitchen azithromycin (ZITHROMAX) 250 MG tablet Take 1 tablet (250 mg total) by mouth daily. Take first 2 tablets together, then 1 every day until finished. 6 tablet 0  .  budesonide-formoterol (SYMBICORT) 160-4.5 MCG/ACT inhaler Inhale 2 puffs into the lungs 2 (two) times daily. 1 Inhaler 5  . diltiazem (DILT-XR) 120 MG 24 hr capsule TAKE 2 CAPSULES BY MOUTH ONCE DAILY IN THE MORNING AND 1 CAPSULE ONCE DAILY IN THE EVENING 270 capsule 1  . LYRICA 150 MG capsule TAKE ONE CAPSULE BY MOUTH THREE TIMES DAILY 90 capsule 0  . Misc. Devices (ACAPELLA) MISC Use as directed 1 each 0  . Oxycodone HCl 10 MG TABS Take 1 mg by mouth every 6 (six) hours as needed (pain).     . predniSONE (DELTASONE) 10 MG tablet Take 3 tablets (30 mg total) by mouth daily for 4 days. 12 tablet 0  . PROAIR HFA 108 (90 Base) MCG/ACT inhaler INHALE TWO PUFFS BY MOUTH EVERY 6 HOURS AS NEEDED FOR WHEEZING OR SHORTNESS OF BREATH 9 each 7  . Spacer/Aero-Holding Chambers (AEROCHAMBER MV) inhaler Use as instructed 1 each 0  . SPIRIVA RESPIMAT 2.5 MCG/ACT AERS INHALE TWO PUFFS BY MOUTH ONCE DAILY 3 Inhaler 3   No facility-administered medications prior to visit.         Objective:   Physical Exam Vitals:   11/20/17 1018  BP: 132/70  Pulse: 72  SpO2: 95%  Weight: 157 lb (71.2 kg)  Height: 5\' 10"  (1.778 m)   Gen: Pleasant, thin, in no distress,  normal affect  ENT: No lesions,  mouth clear,  oropharynx clear, no postnasal drip  Neck: No JVD, no stridor  Lungs: No use of accessory muscles, distant, few scattered exp wheezes.   Cardiovascular: RRR, heart sounds normal, no murmur or gallops, no peripheral edema  Musculoskeletal: No deformities, no cyanosis or clubbing  Neuro: alert, non focal  Skin: Warm, no lesions or rash     Assessment & Plan:  Multiple lung nodules on CT Multiple scattered nodules with splenic calcifications consistent with probable granulomatous disease.  He needs a follow-up scan in August 2019 to look for interval change, this especially in the setting of a history of prostate cancer.  COPD, severe ([place]) He has frequent exacerbations that are typically  characterized by inability to clear secretions and then associated dyspnea and wheezing.  He has not benefited from alternative scheduled bronchodilators in the past and therefore has remained on Spiriva and Symbicort.  We will continue these.  I will add Mucinex to his regimen.  He also believes that he may get better delivery of nebulized albuterol (when he goes to the emergency department) very we will  arrange for him to have a nebulizer at home so that he has access to this as well as his HFA.   Baltazar Apo, MD, PhD 11/20/2017, 10:54 AM [facility] [phone] or if no answer [phone]

## 2017-11-23 ENCOUNTER — Telehealth: Payer: Self-pay | Admitting: Emergency Medicine

## 2017-11-23 ENCOUNTER — Encounter: Payer: Self-pay | Admitting: Family Medicine

## 2017-11-23 ENCOUNTER — Encounter: Payer: Self-pay | Admitting: Emergency Medicine

## 2017-11-23 NOTE — Telephone Encounter (Signed)
Called and spoke with patients daughter Santiago Glad she states that [facility] told her they did not receive an order for the nebulizer machine. Called and spoke with Groves from [facility] and he stated that he has the order and it is being processed. Called back to speak with Santiago Glad, apologized for the confusion and advised her that the order is being worked on. She verbalized understanding. Nothing further needed.

## 2017-11-24 ENCOUNTER — Telehealth: Payer: Self-pay | Admitting: Emergency Medicine

## 2017-11-24 NOTE — Telephone Encounter (Signed)
Patient's daughter calling back, Santiago Glad, stating patient is not eating or drinking much because the albuterol is making him nauseated and she is asking for a call back today with recommendations. Please call patient's wife, Rodrigus Kilker [phone].

## 2017-11-24 NOTE — Telephone Encounter (Signed)
Spoke with patient's wife. She stated that the patient is not able to use the albuterol nebulizer solution. She wants to know if something else can be prescribed. I advised her that albuterol was listed on his allergies list, she stated that was still correct but he is able to tolerate the inhalers, just not the neb solution. The neb solution is not helping him.   RB, please advise. Thanks!

## 2017-11-25 ENCOUNTER — Inpatient Hospital Stay (HOSPITAL_COMMUNITY)
Admission: EM | Admit: 2017-11-25 | Discharge: 2017-11-28 | DRG: 312 | Disposition: A | Discharge status: Home / Self Care | Payer: Medicare Other | Attending: Internal Medicine | Admitting: Internal Medicine

## 2017-11-25 ENCOUNTER — Encounter (HOSPITAL_COMMUNITY): Payer: Self-pay | Admitting: Cardiology

## 2017-11-25 ENCOUNTER — Emergency Department (HOSPITAL_COMMUNITY): Payer: Medicare Other

## 2017-11-25 DIAGNOSIS — J439 Emphysema, unspecified: Secondary | ICD-10-CM | POA: Diagnosis present

## 2017-11-25 DIAGNOSIS — E782 Mixed hyperlipidemia: Secondary | ICD-10-CM | POA: Diagnosis present

## 2017-11-25 DIAGNOSIS — Z9841 Cataract extraction status, right eye: Secondary | ICD-10-CM

## 2017-11-25 DIAGNOSIS — N401 Enlarged prostate with lower urinary tract symptoms: Secondary | ICD-10-CM | POA: Diagnosis present

## 2017-11-25 DIAGNOSIS — Z7951 Long term (current) use of inhaled steroids: Secondary | ICD-10-CM

## 2017-11-25 DIAGNOSIS — J8410 Pulmonary fibrosis, unspecified: Secondary | ICD-10-CM | POA: Diagnosis present

## 2017-11-25 DIAGNOSIS — Z825 Family history of asthma and other chronic lower respiratory diseases: Secondary | ICD-10-CM

## 2017-11-25 DIAGNOSIS — E43 Unspecified severe protein-calorie malnutrition: Secondary | ICD-10-CM

## 2017-11-25 DIAGNOSIS — Z9981 Dependence on supplemental oxygen: Secondary | ICD-10-CM

## 2017-11-25 DIAGNOSIS — Z8619 Personal history of other infectious and parasitic diseases: Secondary | ICD-10-CM

## 2017-11-25 DIAGNOSIS — I951 Orthostatic hypotension: Principal | ICD-10-CM | POA: Diagnosis present

## 2017-11-25 DIAGNOSIS — Z79899 Other long term (current) drug therapy: Secondary | ICD-10-CM

## 2017-11-25 DIAGNOSIS — Z888 Allergy status to other drugs, medicaments and biological substances status: Secondary | ICD-10-CM

## 2017-11-25 DIAGNOSIS — I714 Abdominal aortic aneurysm, without rupture, unspecified: Secondary | ICD-10-CM | POA: Diagnosis present

## 2017-11-25 DIAGNOSIS — Z961 Presence of intraocular lens: Secondary | ICD-10-CM | POA: Diagnosis present

## 2017-11-25 DIAGNOSIS — R338 Other retention of urine: Secondary | ICD-10-CM | POA: Diagnosis present

## 2017-11-25 DIAGNOSIS — I4891 Unspecified atrial fibrillation: Secondary | ICD-10-CM | POA: Diagnosis present

## 2017-11-25 DIAGNOSIS — R918 Other nonspecific abnormal finding of lung field: Secondary | ICD-10-CM | POA: Diagnosis present

## 2017-11-25 DIAGNOSIS — G629 Polyneuropathy, unspecified: Secondary | ICD-10-CM | POA: Diagnosis present

## 2017-11-25 DIAGNOSIS — Z972 Presence of dental prosthetic device (complete) (partial): Secondary | ICD-10-CM

## 2017-11-25 DIAGNOSIS — Z87891 Personal history of nicotine dependence: Secondary | ICD-10-CM

## 2017-11-25 DIAGNOSIS — J441 Chronic obstructive pulmonary disease with (acute) exacerbation: Secondary | ICD-10-CM | POA: Diagnosis present

## 2017-11-25 DIAGNOSIS — Z9842 Cataract extraction status, left eye: Secondary | ICD-10-CM

## 2017-11-25 DIAGNOSIS — I480 Paroxysmal atrial fibrillation: Secondary | ICD-10-CM | POA: Diagnosis present

## 2017-11-25 DIAGNOSIS — Z8546 Personal history of malignant neoplasm of prostate: Secondary | ICD-10-CM

## 2017-11-25 DIAGNOSIS — M47816 Spondylosis without myelopathy or radiculopathy, lumbar region: Secondary | ICD-10-CM | POA: Diagnosis present

## 2017-11-25 DIAGNOSIS — Z8249 Family history of ischemic heart disease and other diseases of the circulatory system: Secondary | ICD-10-CM

## 2017-11-25 DIAGNOSIS — D72829 Elevated white blood cell count, unspecified: Secondary | ICD-10-CM | POA: Diagnosis present

## 2017-11-25 DIAGNOSIS — R8290 Unspecified abnormal findings in urine: Secondary | ICD-10-CM | POA: Diagnosis present

## 2017-11-25 HISTORY — DX: Abdominal aortic aneurysm, without rupture, unspecified: I71.40

## 2017-11-25 HISTORY — DX: Abdominal aortic aneurysm, without rupture: I71.4

## 2017-11-25 LAB — CBC WITH DIFFERENTIAL/PLATELET
Basophils Absolute: 0 10*3/uL (ref 0.0–0.1)
Basophils Relative: 0 %
Eosinophils Absolute: 0.4 10*3/uL (ref 0.0–0.7)
Eosinophils Relative: 3 %
HEMATOCRIT: 42.8 % (ref 39.0–52.0)
HEMOGLOBIN: 14.9 g/dL (ref 13.0–17.0)
LYMPHS ABS: 1.3 10*3/uL (ref 0.7–4.0)
Lymphocytes Relative: 9 %
MCH: 30.5 pg (ref 26.0–34.0)
MCHC: 34.8 g/dL (ref 30.0–36.0)
MCV: 87.7 fL (ref 78.0–100.0)
MONOS PCT: 17 %
Monocytes Absolute: 2.4 10*3/uL — ABNORMAL HIGH (ref 0.1–1.0)
NEUTROS ABS: 9.8 10*3/uL — AB (ref 1.7–7.7)
NEUTROS PCT: 71 %
Platelets: 227 10*3/uL (ref 150–400)
RBC: 4.88 MIL/uL (ref 4.22–5.81)
RDW: 13.9 % (ref 11.5–15.5)
WBC: 13.9 10*3/uL — ABNORMAL HIGH (ref 4.0–10.5)

## 2017-11-25 LAB — BASIC METABOLIC PANEL
Anion gap: 11 (ref 5–15)
BUN: 26 mg/dL — AB (ref 6–20)
CHLORIDE: 105 mmol/L (ref 101–111)
CO2: 23 mmol/L (ref 22–32)
CREATININE: 1.07 mg/dL (ref 0.61–1.24)
Calcium: 8.8 mg/dL — ABNORMAL LOW (ref 8.9–10.3)
GFR calc Af Amer: >60 mL/min (ref >60)
GLUCOSE: 103 mg/dL — AB (ref 65–99)
POTASSIUM: 3.6 mmol/L (ref 3.5–5.1)
Sodium: 139 mmol/L (ref 135–145)

## 2017-11-25 LAB — TROPONIN I: Troponin I: <0.03 ng/mL (ref <0.03)

## 2017-11-25 LAB — BRAIN NATRIURETIC PEPTIDE: B Natriuretic Peptide: 59 pg/mL (ref 0.0–100.0)

## 2017-11-25 LAB — MAGNESIUM: Magnesium: 2.3 mg/dL (ref 1.7–2.4)

## 2017-11-25 LAB — PHOSPHORUS: Phosphorus: 2.3 mg/dL — ABNORMAL LOW (ref 2.5–4.6)

## 2017-11-25 MED ORDER — SODIUM CHLORIDE 0.9 % IV BOLUS (SEPSIS)
500.0000 mL | Freq: Once | INTRAVENOUS | Status: AC
  Administered 2017-11-25: 500 mL via INTRAVENOUS

## 2017-11-25 MED ORDER — LEVALBUTEROL HCL 0.63 MG/3ML IN NEBU
0.6300 mg | INHALATION_SOLUTION | Freq: Four times a day (QID) | RESPIRATORY_TRACT | Status: DC
  Administered 2017-11-26 (×2): 0.63 mg via RESPIRATORY_TRACT
  Filled 2017-11-25: qty 3

## 2017-11-25 MED ORDER — POTASSIUM CHLORIDE IN NACL 20-0.9 MEQ/L-% IV SOLN
INTRAVENOUS | Status: AC
  Administered 2017-11-26: 01:00:00 via INTRAVENOUS

## 2017-11-25 MED ORDER — LEVALBUTEROL HCL 0.63 MG/3ML IN NEBU
0.6300 mg | INHALATION_SOLUTION | Freq: Once | RESPIRATORY_TRACT | Status: AC
  Administered 2017-11-25: 0.63 mg via RESPIRATORY_TRACT
  Filled 2017-11-25: qty 3

## 2017-11-25 MED ORDER — METHYLPREDNISOLONE SODIUM SUCC 40 MG IJ SOLR
40.0000 mg | Freq: Four times a day (QID) | INTRAMUSCULAR | Status: DC
  Administered 2017-11-26 (×2): 40 mg via INTRAVENOUS
  Filled 2017-11-25 (×2): qty 1

## 2017-11-25 MED ORDER — IPRATROPIUM BROMIDE 0.02 % IN SOLN
0.5000 mg | Freq: Four times a day (QID) | RESPIRATORY_TRACT | Status: DC
  Administered 2017-11-26 (×2): 0.5 mg via RESPIRATORY_TRACT
  Filled 2017-11-25: qty 2.5

## 2017-11-25 MED ORDER — ENOXAPARIN SODIUM 40 MG/0.4ML ~~LOC~~ SOLN
40.0000 mg | SUBCUTANEOUS | Status: DC
  Administered 2017-11-26 – 2017-11-28 (×3): 40 mg via SUBCUTANEOUS
  Filled 2017-11-25 (×3): qty 0.4

## 2017-11-25 MED ORDER — METHYLPREDNISOLONE SODIUM SUCC 125 MG IJ SOLR
125.0000 mg | Freq: Once | INTRAMUSCULAR | Status: AC
  Administered 2017-11-25: 125 mg via INTRAVENOUS
  Filled 2017-11-25: qty 2

## 2017-11-25 MED ORDER — LEVALBUTEROL HCL 0.63 MG/3ML IN NEBU: 0.6300 mg | INHALATION_SOLUTION | Freq: Four times a day (QID) | RESPIRATORY_TRACT | Status: DC | PRN

## 2017-11-25 MED ORDER — SODIUM CHLORIDE 0.9 % IV BOLUS (SEPSIS)
1000.0000 mL | Freq: Once | INTRAVENOUS | Status: AC
  Administered 2017-11-25: 1000 mL via INTRAVENOUS

## 2017-11-25 MED ORDER — GUAIFENESIN ER 600 MG PO TB12
600.0000 mg | ORAL_TABLET | Freq: Two times a day (BID) | ORAL | Status: DC
  Administered 2017-11-26 – 2017-11-28 (×6): 600 mg via ORAL
  Filled 2017-11-25 (×6): qty 1

## 2017-11-25 MED ORDER — ONDANSETRON HCL 4 MG PO TABS: 4.0000 mg | ORAL_TABLET | Freq: Four times a day (QID) | ORAL | Status: DC | PRN

## 2017-11-25 MED ORDER — ONDANSETRON HCL 4 MG/2ML IJ SOLN
4.0000 mg | Freq: Four times a day (QID) | INTRAMUSCULAR | Status: DC | PRN
  Administered 2017-11-26: 4 mg via INTRAVENOUS
  Filled 2017-11-25: qty 2

## 2017-11-25 MED ORDER — SODIUM CHLORIDE 0.9 % IV SOLN
INTRAVENOUS | Status: DC
  Administered 2017-11-26: 12:00:00 via INTRAVENOUS

## 2017-11-25 MED ORDER — IPRATROPIUM BROMIDE 0.02 % IN SOLN
0.5000 mg | Freq: Once | RESPIRATORY_TRACT | Status: AC
  Administered 2017-11-25: 0.5 mg via RESPIRATORY_TRACT
  Filled 2017-11-25: qty 2.5

## 2017-11-25 MED ORDER — ENOXAPARIN SODIUM 40 MG/0.4ML ~~LOC~~ SOLN: 40.0000 mg | SUBCUTANEOUS | Status: DC

## 2017-11-25 NOTE — Telephone Encounter (Signed)
Error

## 2017-11-25 NOTE — Telephone Encounter (Signed)
Spoke to [employer].    Patient is more short of breath, nauseated, can't eat, and might be dehydrated. Advised her to call 911 and have him take to hospital  She confirmed plan.  Advised that ER staff can contact our service as needed after he is assessed in the hospital.

## 2017-11-25 NOTE — Telephone Encounter (Signed)
Spoke with pt's wife. She is aware that we are still waiting to hear back from Dr. Lamonte Sakai regarding this message.  Dr. Lamonte Sakai - please advise. Thanks.

## 2017-11-25 NOTE — ED Provider Notes (Signed)
[facility] EMERGENCY DEPARTMENT Provider Note   CSN: [id number] Arrival date & time: 11/25/17  1324     History   Chief Complaint Chief Complaint  Patient presents with  . Shortness of Breath    HPI [name] [name] is a 77 y.o. male.  HPI  Pt was seen at [place].  Per pt and his family, c/o gradual onset and worsening of persistent cough, congestion, and SOB for the past 3 to 4 days. Has been using home MDI and nebs without relief. Has been associated with decreased PO intake because he has been feeling nauseated when using the albuterol nebs.  Denies CP/palpitations, no back pain, no abd pain, no N/V/D, no fevers, no rash.    Past Medical History:  Diagnosis Date  . Cancer ([place])   . COPD with emphysema ([place])    Dr. Joya Gaskins, Gold stage C  . DJD (degenerative joint disease), lumbar   . Emphysema of lung ([place])   . History of prostate cancer    Status post radioactive seed implants  . History of shingles   . Incomplete emptying of bladder   . Mixed hyperlipidemia   . PAF (paroxysmal atrial fibrillation) ([place])   . Palpitations   . Peripheral neuropathy   . Sciatic nerve pain    Secondary to shingles 2011  . Urethral stricture   . Wears partial dentures    Upper and lower    Patient Active Problem List   Diagnosis Date Noted  . Abdominal aortic aneurysm (AAA) without rupture ([place]) 10/27/2017  . Multiple lung nodules on CT 05/14/2016  . COPD, severe ([place]) 02/19/2016  . Emphysema of lung ([place]) 02/19/2016  . Personal history of prostate cancer   . Mixed hyperlipidemia   . DJD (degenerative joint disease), lumbar   . Neuropathy   . Back pain   . 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 ATRIAL FIBRILLATION, PAROXYSMAL 08/31/2007    Past Surgical History:  Procedure Laterality Date  . CATARACT EXTRACTION W/ INTRAOCULAR LENS  IMPLANT, BILATERAL  2012  . COLONOSCOPY  02-14-2003  . CYSTO/  BALLOON DILATION AND INCISION BLADDER NECK CONTRACTURE  X5  LAST ONE 01-24-2009  .  CYSTOSCOPY WITH URETHRAL DILATATION  09/15/2011   Procedure: CYSTOSCOPY WITH URETHRAL DILATATION;  Surgeon: Bernestine Amass, MD;  Location[facility];  Service: Urology;  Laterality: N/A;  CYSTOSCOPY, BALLOON DILATION   . CYSTOSCOPY WITH URETHRAL DILATATION N/A 04/03/2014   Procedure: CYSTOSCOPY WITH dilatation;  Surgeon: Bernestine Amass, MD;  Location[facility];  Service: Urology;  Laterality: N/A;  . EYE SURGERY    . RADIOACTIVE PROSTATE SEED IMPLANTS/  CYSTO WITH BALLOON DILATION BLADDER NECK CONTRACTURE  11-26-2006       Home Medications    Prior to Admission medications   Medication Sig Start Date End Date Taking? Authorizing Provider  albuterol (PROVENTIL HFA;VENTOLIN HFA) 108 (90 Base) MCG/ACT inhaler Inhale 1-2 puffs into the lungs every 6 (six) hours as needed for wheezing or shortness of breath. 11/18/17  Yes Long, Wonda Olds, MD  albuterol (PROVENTIL) (2.5 MG/3ML) 0.083% nebulizer solution Take 3 mLs (2.5 mg total) by nebulization every 4 (four) hours as needed for wheezing or shortness of breath. 11/20/17  Yes Collene Gobble, MD  budesonide-formoterol [facility]) 160-4.5 MCG/ACT inhaler Inhale 2 puffs into the lungs 2 (two) times daily. 11/17/17  Yes Parrett, Tammy S, NP  diltiazem (DILT-XR) 120 MG 24 hr capsule TAKE 2 CAPSULES BY MOUTH ONCE DAILY IN  THE MORNING AND 1 CAPSULE ONCE DAILY IN THE EVENING Patient taking differently: Take 120 mg by mouth See admin instructions. TAKE 2 CAPSULES BY MOUTH ONCE DAILY IN THE MORNING AND 1 CAPSULE ONCE DAILY IN THE EVENING 11/06/17  Yes Satira Sark, MD  LYRICA 150 MG capsule TAKE ONE CAPSULE BY MOUTH THREE TIMES DAILY 05/23/14  Yes Marcial Pacas, MD  Oxycodone HCl 10 MG TABS Take 1 mg by mouth every 6 (six) hours as needed (pain).    Yes [provider]  SPIRIVA RESPIMAT 2.5 MCG/ACT AERS INHALE TWO PUFFS BY MOUTH ONCE DAILY 12/19/16  Yes Javier Glazier, MD  azithromycin (ZITHROMAX) 250 MG tablet Take 1  tablet (250 mg total) by mouth daily. Take first 2 tablets together, then 1 every day until finished. Patient not taking: Reported on 11/25/2017 11/18/17   Long, Wonda Olds, MD  Misc. Devices [facility]) MISC Use as directed 10/21/16   Javier Glazier, MD  Spacer/Aero-Holding Chambers (AEROCHAMBER MV) inhaler Use as instructed 10/21/16   Javier Glazier, MD    Family History Family History  Problem Relation Age of Onset  . COPD Mother   . Heart disease Brother        died at 61    Social History Social History   Tobacco Use  . Smoking status: Former Smoker    Packs/day: 2.00    Years: 40.00    Pack years: 80.00    Types: Cigarettes    Start date: 06/19/1954    Last attempt to quit: 09/15/1998    Years since quitting: 19.2  . Smokeless tobacco: Never Used  . Tobacco comment: Counseled to remain smoke free  Substance Use Topics  . Alcohol use: No    Alcohol/week: 0.0 oz  . Drug use: No     Allergies   Albuterol   Review of Systems Review of Systems ROS: Statement: All systems negative except as marked or noted in the HPI; Constitutional: Negative for fever and chills. ; ; Eyes: Negative for eye pain, redness and discharge. ; ; ENMT: Negative for ear pain, hoarseness, nasal congestion, sinus pressure and sore throat. ; ; Cardiovascular: Negative for chest pain, palpitations, diaphoresis, and peripheral edema. ; ; Respiratory: +cough, congestion, SOB. Negative for wheezing and stridor. ; ; Gastrointestinal: Negative for nausea, vomiting, diarrhea, abdominal pain, blood in stool, hematemesis, jaundice and rectal bleeding. . ; ; Genitourinary: Negative for dysuria, flank pain and hematuria. ; ; Musculoskeletal: Negative for back pain and neck pain. Negative for swelling and trauma.; ; Skin: Negative for pruritus, rash, abrasions, blisters, bruising and skin lesion.; ; Neuro: Negative for headache, lightheadedness and neck stiffness. Negative for weakness, altered level of consciousness,  altered mental status, extremity weakness, paresthesias, involuntary movement, seizure and syncope.       Physical Exam Updated Vital Signs BP 131/70   Pulse 100   Temp (!) 97.5 F (36.4 C) (Oral)   Resp (!) 21   Ht 5\' 10"  (1.778 m)   Wt 74.8 kg (165 lb)   SpO2 97%   BMI 23.68 kg/m    Patient Vitals for the past 24 hrs:  BP Temp Temp src Pulse Resp SpO2 Height Weight  11/25/17 2100 129/73 - - (!) 103 (!) 26 98 % - -  11/25/17 2041 - - - - - 97 % - -  11/25/17 2030 127/73 - - 98 (!) 26 98 % - -  11/25/17 2000 131/70 - - 100 (!) 21 98 % - -  11/25/17 1930 121/73 - - (!) 101 (!) 21 97 % - -  11/25/17 1902 130/74 (!) 97.5 F (36.4 C) Oral (!) 105 20 98 % - -  11/25/17 1900 - - - - - - 5\' 10"  (1.778 m) 74.8 kg (165 lb)   22:25:46 Orthostatic Vital Signs HL  Orthostatic Lying   BP- Lying: 112/75  Pulse- Lying: 103      Orthostatic Sitting  BP- Sitting:  88/68  Pulse- Sitting: 111      Orthostatic Standing at 0 minutes  BP- Standing at 0 minutes: 111/71  Pulse- Standing at 0 minutes: 116      Orthostatic Standing at 3 minutes  BP- Standing at 3 minutes: 116/62  Pulse- Standing at 3 minutes: 116     Physical Exam 1950: Physical examination:  Nursing notes reviewed; Vital signs and O2 SAT reviewed;  Constitutional: Well developed, Well nourished, In no acute distress; Head:  Normocephalic, atraumatic; Eyes: EOMI, PERRL, No scleral icterus; ENMT: Mouth and pharynx normal, Mucous membranes dry; Neck: Supple, Full range of motion, No lymphadenopathy; Cardiovascular: Tachycardic rate and rhythm, No gallop; Respiratory: Breath sounds diminished & equal bilaterally, No wheezes.  Speaking full sentences with ease, Normal respiratory effort/excursion; Chest: Nontender, Movement normal; Abdomen: Soft, Nontender, Nondistended, Normal bowel sounds; Genitourinary: No CVA tenderness; Extremities: Peripheral pulses normal, No tenderness, No edema, No calf edema or asymmetry.; Neuro:  AA&Ox3, Major CN grossly intact.  Speech clear. No gross focal motor or sensory deficits in extremities.; Skin: Color normal, Warm, Dry.   ED Treatments / Results  Labs (all labs ordered are listed, but only abnormal results are displayed)   EKG  EKG Interpretation  Date/Time:  Wednesday November 25 2017 19:28:19 EDT Ventricular Rate:  101 PR Interval:    QRS Duration: 75 QT Interval:  346 QTC Calculation: 449 R Axis:   82 Text Interpretation:  Sinus tachycardia Probable left atrial enlargement Borderline right axis deviation RSR' in V1 or V2, probably normal variant Baseline wander When compared with ECG of 11/18/2017 No significant change was found Confirmed by Francine Graven [phone]) on 11/25/2017 7:50:57 PM       Radiology   Procedures Procedures (including critical care time)  Medications Ordered in ED Medications  levalbuterol (XOPENEX) nebulizer solution 0.63 mg (0.63 mg Nebulization Given 11/25/17 2039)  ipratropium (ATROVENT) nebulizer solution 0.5 mg (0.5 mg Nebulization Given 11/25/17 2039)  methylPREDNISolone sodium succinate (SOLU-MEDROL) 125 mg/2 mL injection 125 mg (125 mg Intravenous Given 11/25/17 2043)     Initial Impression / Assessment and Plan / ED Course  I have reviewed the triage vital signs and the nursing notes.  Pertinent labs & imaging results that were available during my care of the patient were reviewed by me and considered in my medical decision making (see chart for details).  MDM Reviewed: previous chart, nursing note and vitals Reviewed previous: labs and ECG Interpretation: labs, ECG and x-ray    Results for orders placed or performed during the hospital encounter of 67/89/38  Basic metabolic panel  Result Value Ref Range   Sodium 139 135 - 145 mmol/L   Potassium 3.6 3.5 - 5.1 mmol/L   Chloride 105 101 - 111 mmol/L   CO2 23 22 - 32 mmol/L   Glucose, Bld 103 (H) 65 - 99 mg/dL   BUN 26 (H) 6 - 20 mg/dL   Creatinine, Ser 1.07 0.61 -  1.24 mg/dL   Calcium 8.8 (L) 8.9 - 10.3 mg/dL   GFR calc non  Af Amer >60 >60 mL/min   GFR calc Af Amer >60 >60 mL/min   Anion gap 11 5 - 15  Brain natriuretic peptide  Result Value Ref Range   B Natriuretic Peptide 59.0 0.0 - 100.0 pg/mL  Troponin I  Result Value Ref Range   Troponin I <0.03 <0.03 ng/mL  CBC with Differential  Result Value Ref Range   WBC 13.9 (H) 4.0 - 10.5 K/uL   RBC 4.88 4.22 - 5.81 MIL/uL   Hemoglobin 14.9 13.0 - 17.0 g/dL   HCT 42.8 39.0 - 52.0 %   MCV 87.7 78.0 - 100.0 fL   MCH 30.5 26.0 - 34.0 pg   MCHC 34.8 30.0 - 36.0 g/dL   RDW 13.9 11.5 - 15.5 %   Platelets 227 150 - 400 K/uL   Neutrophils Relative % 71 %   Neutro Abs 9.8 (H) 1.7 - 7.7 K/uL   Lymphocytes Relative 9 %   Lymphs Abs 1.3 0.7 - 4.0 K/uL   Monocytes Relative 17 %   Monocytes Absolute 2.4 (H) 0.1 - 1.0 K/uL   Eosinophils Relative 3 %   Eosinophils Absolute 0.4 0.0 - 0.7 K/uL   Basophils Relative 0 %   Basophils Absolute 0.0 0.0 - 0.1 K/uL   Dg Chest 2 View Result Date: 11/25/2017 CLINICAL DATA:  Dyspnea since Friday. EXAM: CHEST - 2 VIEW COMPARISON:  11/18/2017 FINDINGS: The heart size and mediastinal contours are within normal limits. Minimal aortic atherosclerosis at the arch without aneurysm. Hyperinflated lungs without acute pneumonic consolidation or CHF. Small calcified granuloma at the left lung base. The visualized skeletal structures are unremarkable. IMPRESSION: Hyperinflated lungs consistent with COPD. Calcified granuloma projects over the left lung base. Electronically Signed   By: Ashley Royalty M.D.   On: 11/25/2017 20:13    2300:  On arrival: pt sitting upright, tachypneic, tachycardic, Sats 96 % R/A, lungs diminished. IV solumedrol given. Xopenex/atrovent neb given d/t pt endorsing "allergy" to albuterol neb.  After neb: pt appears more comfortable at rest, less tachypneic, Sats 98 % on R/A, lungs continue diminished. Pt only able to ambulate to door of exam room before he became  fatigued, tachycardic and tachypneic, and c/o "dizziness" (near syncope). Sats did not decrease. Pt orthostatic during VS; judicious IVF given. Dx and testing d/w pt and family.  Questions answered.  Verb understanding, agreeable to admit.  T/C returned from [facility] Dr. Olevia Bowens, case discussed, including:  HPI, pertinent PM/SHx, VS/PE, dx testing, ED course and treatment:  Agreeable to admit.        Final Clinical Impressions(s) / ED Diagnoses   Final diagnoses:  None    ED Discharge Orders    None       Francine Graven, DO 11/26/17 2312

## 2017-11-25 NOTE — H&P (Signed)
4       History and Physical    [name] [name] MWU:[id number] DOB: 1940-09-22 DOA: 11/25/2017  PCP: [name], MD   Patient coming from: Home.  I have personally briefly reviewed patient's old medical records in [place]  Chief Complaint: Shortness of breath.  HPI: [name] [name] is a 77 y.o. male with medical history significant of AAA, prostate cancer/status post radioactive seed implants treatment, COPD/emphysema, history of shingles, mixed hyperlipidemia, paroxysmal atrial fibrillation, peripheral neuropathy, post zoster sciatic nerve neuropathy, ureteral stricture who is brought to the emergency department due to shortness of breath with inability to take a deep breath, decreased appetite for the last 4 days associated with chills since last week.  Per patient and family members, he was seen here on 11/18/2017 due to COPD exacerbation.  He given supplemental oxygen, bronchodilators Solu-Medrol 125 mg IVP x1, then he was discharged home on albuterol MDI and Zithromax.  However, he mentions that since Thursday evening his dyspnea has gotten progressively worse.  He woke up on Friday morning feeling sick, dyspneic, which was not relieved by albuterol.  However, he mentions that albuterol made him nauseated and gave him palpitations.  His appetite and sleep have decreased.  He feels very fatigued.  He has been having mild dysuria and increased straining when he urinates.  He mentions that his urine color has turned orange. He denies fever, productive cough, sore throat, chest pain, orthopnea or pitting edema of the lower extremities.  No heat or cold intolerance.  No polyuria, polydipsia or polyphagia.  No blurred vision.  ED Course: Initial vital signs temperature 36.4C (97.5 F), pulse 105, respirations 20, blood pressure 130/74 mmHg and O2 sat 98% on room air.  He was given supplemental oxygen, level albuterol plus ipratropium via nebulizer and a 500 mL normal saline bolus in the  ED.  His white count was 13.9 with 71% neutrophils, 9% lymphocytes and 17% monocytes.  BMP shows a glucose of 103, BUN 26 creatinine 1.07 and calcium of 8.8 mg/dL.  Electrolytes and anion gap were normal.  EKG was sinus tach at 101 bpm, probable left atrial enlargement, borderline right axis deviation, baseline wander.  When compared to earlier, left atrium and axis parameters changes are new.  Troponin was less than 0.03 ng/mL.  BNP was 59.0 pg/mL.  His chest radiograph shows hyperinflated lungs consistent with COPD.  Calcified granuloma projects over the left lung base.  Review of Systems: As per HPI otherwise 10 point review of systems negative.    Past Medical History:  Diagnosis Date  . AAA (abdominal aortic aneurysm) ([place])   . COPD with emphysema ([place])    Dr. Joya Gaskins, Gold stage C  . DJD (degenerative joint disease), lumbar   . Emphysema of lung ([place])   . History of prostate cancer    Status post radioactive seed implants  . History of shingles   . Incomplete emptying of bladder   . Mixed hyperlipidemia   . PAF (paroxysmal atrial fibrillation) ([place])   . Palpitations   . Peripheral neuropathy   . Sciatic nerve pain    Secondary to shingles 2011  . Urethral stricture   . Wears partial dentures    Upper and lower    Past Surgical History:  Procedure Laterality Date  . CATARACT EXTRACTION W/ INTRAOCULAR LENS  IMPLANT, BILATERAL  2012  . COLONOSCOPY  02-14-2003  . CYSTO/  BALLOON DILATION AND INCISION BLADDER NECK CONTRACTURE  X5  LAST  ONE 01-24-2009  . CYSTOSCOPY WITH URETHRAL DILATATION  09/15/2011   Procedure: CYSTOSCOPY WITH URETHRAL DILATATION;  Surgeon: Bernestine Amass, MD;  [facility];  Service: Urology;  Laterality: N/A;  CYSTOSCOPY, BALLOON DILATION   . CYSTOSCOPY WITH URETHRAL DILATATION N/A 04/03/2014   Procedure: CYSTOSCOPY WITH dilatation;  Surgeon: Bernestine Amass, MD;  Location: [facility];  Service: Urology;  Laterality:  N/A;  . EYE SURGERY    . RADIOACTIVE PROSTATE SEED IMPLANTS/  CYSTO WITH BALLOON DILATION BLADDER NECK CONTRACTURE  11-26-2006     reports that he quit smoking about 19 years ago. His smoking use included cigarettes. He started smoking about 63 years ago. He has a 80.00 pack-year smoking history. he has never used smokeless tobacco. He reports that he does not drink alcohol or use drugs.  Allergies  Allergen Reactions  . Albuterol Other (See Comments)    Caused extreme tachycardia. If used by continuous infusion.  May use the inhaler    Family History  Problem Relation Age of Onset  . COPD Mother   . Heart disease Brother        died at 9    Prior to Admission medications   Medication Sig Start Date End Date Taking? Authorizing Provider  albuterol (PROVENTIL HFA;VENTOLIN HFA) 108 (90 Base) MCG/ACT inhaler Inhale 1-2 puffs into the lungs every 6 (six) hours as needed for wheezing or shortness of breath. 11/18/17  Yes Long, Wonda Olds, MD  albuterol (PROVENTIL) (2.5 MG/3ML) 0.083% nebulizer solution Take 3 mLs (2.5 mg total) by nebulization every 4 (four) hours as needed for wheezing or shortness of breath. 11/20/17  Yes Collene Gobble, MD  budesonide-formoterol [facility]) 160-4.5 MCG/ACT inhaler Inhale 2 puffs into the lungs 2 (two) times daily. 11/17/17  Yes Parrett, Tammy S, NP  diltiazem (DILT-XR) 120 MG 24 hr capsule TAKE 2 CAPSULES BY MOUTH ONCE DAILY IN THE MORNING AND 1 CAPSULE ONCE DAILY IN THE EVENING Patient taking differently: Take 120 mg by mouth See admin instructions. TAKE 2 CAPSULES BY MOUTH ONCE DAILY IN THE MORNING AND 1 CAPSULE ONCE DAILY IN THE EVENING 11/06/17  Yes Satira Sark, MD  LYRICA 150 MG capsule TAKE ONE CAPSULE BY MOUTH THREE TIMES DAILY 05/23/14  Yes Marcial Pacas, MD  Oxycodone HCl 10 MG TABS Take 1 mg by mouth every 6 (six) hours as needed (pain).    Yes [provider]  SPIRIVA RESPIMAT 2.5 MCG/ACT AERS INHALE TWO PUFFS BY MOUTH ONCE DAILY 12/19/16  Yes  Javier Glazier, MD  azithromycin (ZITHROMAX) 250 MG tablet Take 1 tablet (250 mg total) by mouth daily. Take first 2 tablets together, then 1 every day until finished. Patient not taking: Reported on 11/25/2017 11/18/17   Long, Wonda Olds, MD  Misc. Devices (ACAPELLA) MISC Use as directed 10/21/16   Javier Glazier, MD  Spacer/Aero-Holding Chambers (AEROCHAMBER MV) inhaler Use as instructed 10/21/16   Javier Glazier, MD    Physical Exam: Vitals:   11/25/17 2000 11/25/17 2030 11/25/17 2041 11/25/17 2100  BP: 131/70 127/73  129/73  Pulse: 100 98  (!) 103  Resp: (!) 21 (!) 26  (!) 26  Temp:      TempSrc:      SpO2: 98% 98% 97% 98%  Weight:      Height:        Constitutional: Chronically ill looking, but in NAD, calm, comfortable Eyes: PERRL, lids and conjunctivae normal ENMT: Mucous membranes are  dry. Posterior pharynx clear of any exudate or lesions.Normal dentition.  Neck: normal, supple, no masses, no thyromegaly Respiratory: Decreased breath sounds with wheezing bilaterally, positive bibasilar crackles. Normal respiratory effort. No accessory muscle use.  Cardiovascular: Tachycardic at 108 bpm, no murmurs / rubs / gallops. No extremity edema. 2+ pedal pulses. No carotid bruits.  Abdomen: Soft, positive suprapubic tenderness, no guarding/rebound/masses palpated. No hepatosplenomegaly. Bowel sounds positive.  Musculoskeletal: no cyanosis. Good ROM, no contractures. Normal muscle tone.  Skin: Several areas of ecchymosis on upper extremities. Neurologic: CN 2-12 grossly intact. Sensation intact, DTR normal. Strength 5/5 in all 4.  Psychiatric: Normal judgment and insight. Alert and oriented x 4.     Labs on Admission: I have personally reviewed following labs and imaging studies  CBC: Recent Labs  Lab 11/25/17 2031  WBC 13.9*  NEUTROABS 9.8*  HGB 14.9  HCT 42.8  MCV 87.7  PLT 419   Basic Metabolic Panel: Recent Labs  Lab 11/25/17 2031  NA 139  K 3.6  CL 105  CO2 23   GLUCOSE 103*  BUN 26*  CREATININE 1.07  CALCIUM 8.8*   GFR: Estimated Creatinine Clearance: 60.6 mL/min (by C-G formula based on SCr of 1.07 mg/dL). Liver Function Tests: No results for input(s): AST, ALT, ALKPHOS, BILITOT, PROT, ALBUMIN in the last 168 hours. No results for input(s): LIPASE, AMYLASE in the last 168 hours. No results for input(s): AMMONIA in the last 168 hours. Coagulation Profile: No results for input(s): INR, PROTIME in the last 168 hours. Cardiac Enzymes: Recent Labs  Lab 11/25/17 2031  TROPONINI <0.03   BNP (last 3 results) No results for input(s): PROBNP in the last 8760 hours. HbA1C: No results for input(s): HGBA1C in the last 72 hours. CBG: No results for input(s): GLUCAP in the last 168 hours. Lipid Profile: No results for input(s): CHOL, HDL, LDLCALC, TRIG, CHOLHDL, LDLDIRECT in the last 72 hours. Thyroid Function Tests: No results for input(s): TSH, T4TOTAL, FREET4, T3FREE, THYROIDAB in the last 72 hours. Anemia Panel: No results for input(s): VITAMINB12, FOLATE, FERRITIN, TIBC, IRON, RETICCTPCT in the last 72 hours. Urine analysis:    Component Value Date/Time   COLORURINE YELLOW 10/27/2017 [place] 10/27/2017 1435   LABSPEC 1.008 10/27/2017 1435   PHURINE 5.5 10/27/2017 1435   GLUCOSEU NEGATIVE 10/27/2017 1435   HGBUR NEGATIVE 10/27/2017 1435   KETONESUR NEGATIVE 10/27/2017 1435   PROTEINUR NEGATIVE 10/27/2017 1435   NITRITE NEGATIVE 10/27/2017 1435   LEUKOCYTESUR NEGATIVE 10/27/2017 1435    Radiological Exams on Admission: Dg Chest 2 View  Result Date: 11/25/2017 CLINICAL DATA:  Dyspnea since Friday. EXAM: CHEST - 2 VIEW COMPARISON:  11/18/2017 FINDINGS: The heart size and mediastinal contours are within normal limits. Minimal aortic atherosclerosis at the arch without aneurysm. Hyperinflated lungs without acute pneumonic consolidation or CHF. Small calcified granuloma at the left lung base. The visualized skeletal  structures are unremarkable. IMPRESSION: Hyperinflated lungs consistent with COPD. Calcified granuloma projects over the left lung base. Electronically Signed   By: Ashley Royalty M.D.   On: 11/25/2017 20:13    EKG: Independently reviewed. Vent. rate 101 BPM PR interval * ms QRS duration 75 ms QT/QTc 346/449 ms P-R-T axes 79 82 68 Sinus tachycardia Probable left atrial enlargement Borderline right axis deviation RSR' in V1 or V2, probably normal variant Baseline wander  Assessment/Plan Principal Problem:   Orthostatic hypotension Telemetry/observation. Continue IV fluids. Hold Cardizem for now. Hold oxycodone for now. Monitor blood pressure closely.  Active Problems:   COPD with acute exacerbation (HCC) Continue supplemental oxygen. Continue Xopenex 0.63+ ipratropium 0.5 mg every 6 hours. Continue Xopenex 0.63 mg every 6 hours as needed. Solu-Medrol 40 mg IVP every 6 hours. Magnesium sulfate 2 g IVPB x1. Switch to oral prednisone once clinically improved.    ATRIAL FIBRILLATION, PAROXYSMAL CHA?DS?-VASc Score of at least 2. Not on anticoagulation. Diltiazem and has been held due to hypotension. Continue cardiac telemetry. Resume Cardizem once his blood pressure tolerates it.    Abdominal aortic aneurysm (AAA) without rupture (HCC) Check abdominal ultrasound in the morning.    Leukocytosis Likely due to corticosteroids use. Monitor white count.    Mixed hyperlipidemia Currently not on medical therapy. Advised to continue lifestyle modifications.    Neuropathy Continue Lyrica 150 mg p.o. 3 times daily.    Multiple lung nodules on CT Obtain follow-up imaging as scheduled. Follow-up with pulmonology as scheduled.    Suprapubic discomfort.   Abnormal urine color. Urinalysis still pending.   DVT prophylaxis: Lovenox SQ. Code Status: Full code. Family Communication: His wife was present in the ED room. Disposition Plan: Observation for IV hydration and COPD  exacerbation treatment. Consults called:  Admission status: Observation/telemetry.   Reubin Milan MD [facility] Pager [phone]  If 7PM-7AM, please contact night-coverage [email or web address] Password [email or web address]  11/25/2017, 11:13 PM

## 2017-11-25 NOTE — ED Triage Notes (Signed)
Decreased appetite last 4 days.  C/o chills and not being able to take a deep breath in.  97% room air oxygen. Uses oxygen PRN at home.  105/76,  Heart rate 110  NSR.

## 2017-11-25 NOTE — ED Notes (Signed)
Patient could not ambulate he got very dizzy and couldn't make it to the door. O2 stayed at 97

## 2017-11-26 ENCOUNTER — Other Ambulatory Visit: Payer: Self-pay

## 2017-11-26 ENCOUNTER — Observation Stay (HOSPITAL_BASED_OUTPATIENT_CLINIC_OR_DEPARTMENT_OTHER): Payer: Medicare Other

## 2017-11-26 ENCOUNTER — Observation Stay (HOSPITAL_COMMUNITY): Payer: Medicare Other

## 2017-11-26 DIAGNOSIS — I5030 Unspecified diastolic (congestive) heart failure: Secondary | ICD-10-CM | POA: Diagnosis not present

## 2017-11-26 DIAGNOSIS — I951 Orthostatic hypotension: Secondary | ICD-10-CM | POA: Diagnosis not present

## 2017-11-26 LAB — COMPREHENSIVE METABOLIC PANEL
ALK PHOS: 54 U/L (ref 38–126)
ALT: 21 U/L (ref 17–63)
AST: 17 U/L (ref 15–41)
Albumin: 2.6 g/dL — ABNORMAL LOW (ref 3.5–5.0)
Anion gap: 11 (ref 5–15)
BILIRUBIN TOTAL: 2.3 mg/dL — AB (ref 0.3–1.2)
BUN: 26 mg/dL — AB (ref 6–20)
CALCIUM: 8.2 mg/dL — AB (ref 8.9–10.3)
CO2: 18 mmol/L — ABNORMAL LOW (ref 22–32)
Chloride: 108 mmol/L (ref 101–111)
Creatinine, Ser: 0.89 mg/dL (ref 0.61–1.24)
GFR calc Af Amer: >60 mL/min (ref >60)
GFR calc non Af Amer: >60 mL/min (ref >60)
GLUCOSE: 167 mg/dL — AB (ref 65–99)
Potassium: 3.9 mmol/L (ref 3.5–5.1)
Sodium: 137 mmol/L (ref 135–145)
TOTAL PROTEIN: 6.2 g/dL — AB (ref 6.5–8.1)

## 2017-11-26 LAB — CBC
HEMATOCRIT: 39.8 % (ref 39.0–52.0)
HEMOGLOBIN: 13.6 g/dL (ref 13.0–17.0)
MCH: 30 pg (ref 26.0–34.0)
MCHC: 34.2 g/dL (ref 30.0–36.0)
MCV: 87.7 fL (ref 78.0–100.0)
Platelets: 212 10*3/uL (ref 150–400)
RBC: 4.54 MIL/uL (ref 4.22–5.81)
RDW: 13.7 % (ref 11.5–15.5)
WBC: 5.4 10*3/uL (ref 4.0–10.5)

## 2017-11-26 LAB — URINALYSIS, ROUTINE W REFLEX MICROSCOPIC
Bilirubin Urine: NEGATIVE
GLUCOSE, UA: NEGATIVE mg/dL
Hgb urine dipstick: NEGATIVE
Ketones, ur: 20 mg/dL — AB
Leukocytes, UA: NEGATIVE
Nitrite: NEGATIVE
Protein, ur: NEGATIVE mg/dL
pH: 5 (ref 5.0–8.0)

## 2017-11-26 LAB — ECHOCARDIOGRAM COMPLETE
Height: 70 in
Weight: 2437.41 oz

## 2017-11-26 MED ORDER — IPRATROPIUM BROMIDE 0.02 % IN SOLN
0.5000 mg | Freq: Three times a day (TID) | RESPIRATORY_TRACT | Status: DC
  Administered 2017-11-26 (×2): 0.5 mg via RESPIRATORY_TRACT
  Filled 2017-11-26 (×2): qty 2.5

## 2017-11-26 MED ORDER — ONDANSETRON HCL 4 MG/2ML IJ SOLN
INTRAMUSCULAR | Status: AC
  Filled 2017-11-26: qty 2

## 2017-11-26 MED ORDER — K PHOS MONO-SOD PHOS DI & MONO 155-852-130 MG PO TABS
250.0000 mg | ORAL_TABLET | Freq: Three times a day (TID) | ORAL | Status: AC
  Administered 2017-11-26 (×3): 250 mg via ORAL
  Filled 2017-11-26 (×4): qty 1

## 2017-11-26 MED ORDER — IOPAMIDOL (ISOVUE-370) INJECTION 76%
100.0000 mL | Freq: Once | INTRAVENOUS | Status: AC | PRN
  Administered 2017-11-26: 100 mL via INTRAVENOUS

## 2017-11-26 MED ORDER — DILTIAZEM HCL ER COATED BEADS 240 MG PO CP24
240.0000 mg | ORAL_CAPSULE | Freq: Every day | ORAL | Status: DC
  Administered 2017-11-26 – 2017-11-28 (×3): 240 mg via ORAL
  Filled 2017-11-26 (×3): qty 1

## 2017-11-26 MED ORDER — TAMSULOSIN HCL 0.4 MG PO CAPS
0.4000 mg | ORAL_CAPSULE | Freq: Every day | ORAL | Status: DC
  Administered 2017-11-26 – 2017-11-28 (×3): 0.4 mg via ORAL
  Filled 2017-11-26 (×3): qty 1

## 2017-11-26 MED ORDER — DILTIAZEM HCL ER 120 MG PO CP24
240.0000 mg | ORAL_CAPSULE | Freq: Every day | ORAL | Status: DC
  Filled 2017-11-26 (×2): qty 2

## 2017-11-26 MED ORDER — ENSURE ENLIVE PO LIQD
237.0000 mL | Freq: Two times a day (BID) | ORAL | Status: DC
  Administered 2017-11-26 – 2017-11-27 (×3): 237 mL via ORAL

## 2017-11-26 MED ORDER — K PHOS MONO-SOD PHOS DI & MONO 155-852-130 MG PO TABS: 250.0000 mg | ORAL_TABLET | Freq: Three times a day (TID) | ORAL | Status: DC

## 2017-11-26 MED ORDER — DILTIAZEM HCL ER COATED BEADS 120 MG PO CP24: 120.0000 mg | ORAL_CAPSULE | Freq: Every day | ORAL | Status: DC

## 2017-11-26 MED ORDER — LEVALBUTEROL HCL 0.63 MG/3ML IN NEBU
INHALATION_SOLUTION | RESPIRATORY_TRACT | Status: AC
  Filled 2017-11-26: qty 3

## 2017-11-26 MED ORDER — IPRATROPIUM BROMIDE 0.02 % IN SOLN
RESPIRATORY_TRACT | Status: AC
  Filled 2017-11-26: qty 2.5

## 2017-11-26 MED ORDER — LEVALBUTEROL HCL 0.63 MG/3ML IN NEBU
0.6300 mg | INHALATION_SOLUTION | Freq: Three times a day (TID) | RESPIRATORY_TRACT | Status: DC
  Administered 2017-11-26 (×2): 0.63 mg via RESPIRATORY_TRACT
  Filled 2017-11-26 (×2): qty 3

## 2017-11-26 MED ORDER — METHYLPREDNISOLONE SODIUM SUCC 40 MG IJ SOLR
40.0000 mg | Freq: Two times a day (BID) | INTRAMUSCULAR | Status: DC
  Administered 2017-11-27 – 2017-11-28 (×4): 40 mg via INTRAVENOUS
  Filled 2017-11-26 (×5): qty 1

## 2017-11-26 MED ORDER — PREGABALIN 75 MG PO CAPS
150.0000 mg | ORAL_CAPSULE | Freq: Three times a day (TID) | ORAL | Status: DC
  Administered 2017-11-26 – 2017-11-28 (×7): 150 mg via ORAL
  Filled 2017-11-26 (×7): qty 2

## 2017-11-26 MED ORDER — DILTIAZEM HCL ER COATED BEADS 120 MG PO CP24
120.0000 mg | ORAL_CAPSULE | Freq: Every day | ORAL | Status: DC
  Administered 2017-11-26 – 2017-11-27 (×2): 120 mg via ORAL
  Filled 2017-11-26 (×3): qty 1

## 2017-11-26 MED ORDER — POTASSIUM CHLORIDE IN NACL 20-0.9 MEQ/L-% IV SOLN
INTRAVENOUS | Status: AC
  Filled 2017-11-26: qty 1000

## 2017-11-26 NOTE — Progress Notes (Signed)
PROGRESS NOTE    OISIN YOAKUM  DQQ:[id number] DOB: 1941/05/16 DOA: 11/25/2017 PCP: [name], MD   Brief Narrative:   TRUITT CRUEY is a 77 y.o. male with medical history significant of AAA, prostate cancer/status post radioactive seed implants treatment, COPD/emphysema, history of shingles, mixed hyperlipidemia, paroxysmal atrial fibrillation, peripheral neuropathy, post zoster sciatic nerve neuropathy, ureteral stricture who is brought to the emergency department due to shortness of breath with inability to take a deep breath, decreased appetite for the last 4 days associated with chills since last week.  He was diagnosed with acute COPD exacerbation as well as some mild orthostatic hypotension which have both improved.    Assessment & Plan:   Principal Problem:   Orthostatic hypotension Active Problems:   ATRIAL FIBRILLATION, PAROXYSMAL   Mixed hyperlipidemia   Neuropathy   Multiple lung nodules on CT   Abdominal aortic aneurysm (AAA) without rupture (HCC)   COPD with acute exacerbation (HCC)   Leukocytosis   1. Orthostatic hypotension-resolved.  DC IV fluid.  Restart home Cardizem dose and monitor her blood pressures closely. 2. Acute COPD exacerbation.  Continue DuoNeb breathing treatments with Xopenex.  Solu-Medrol decreased to twice daily. 3. Paroxysmal atrial fibrillation.  Restart diltiazem.  Chads score of 2 and therefore patient not on anticoagulation. 4. Abdominal aortic aneurysm.  Abdominal ultrasound performed this morning with stable findings.  This is to be reassessed in 5 years. 5. Mixed dyslipidemia.  Continue lifestyle modifications. 6. Neuropathy.  Continue Lyrica 3 times daily. 7. Multiple lung nodules on CT.  Follow-up imaging and with pulmonology as scheduled. 8. Suprapubic discomfort secondary to urinary retention.  Urine analysis with no findings of UTI.  Placed Foley catheter at this time and started on Flomax.  We will plan to remove  shortly.   DVT prophylaxis:Lovenox Code Status: Full Family Communication: Wife/daughter at bedside Disposition Plan: COPD treatment ongoing; anticipate DC in 1-2 days   Consultants:   None  Procedures:   None  Antimicrobials:   None   Subjective: Patient seen and evaluated today with no new acute complaints or concerns.  He was noted to have suprapubic discomfort for which bladder scan was performed demonstrating greater than 200 cc of urinary retention.  Foley has been placed with improvement.  Objective: Vitals:   11/26/17 0200 11/26/17 0533 11/26/17 0805 11/26/17 0951  BP: (!) 126/56 126/62    Pulse: 100 (!) 104    Resp: 18     Temp: 97.6 F (36.4 C)     TempSrc: Oral     SpO2: 98% 97% 96% 97%  Weight: 69.1 kg (152 lb 5.4 oz)     Height: 5\' 10"  (1.778 m)       Intake/Output Summary (Last 24 hours) at 11/26/2017 1344 Last data filed at 11/26/2017 0400 Gross per 24 hour  Intake 1740 ml  Output -  Net 1740 ml   Filed Weights   11/25/17 1900 11/26/17 0200  Weight: 74.8 kg (165 lb) 69.1 kg (152 lb 5.4 oz)    Examination:  General exam: Appears calm and comfortable  Respiratory system: Clear to auscultation. Respiratory effort normal. Cardiovascular system: S1 & S2 heard, RRR. No JVD, murmurs, rubs, gallops or clicks. No pedal edema. Gastrointestinal system: Abdomen is nondistended, soft and nontender. No organomegaly or masses felt. Normal bowel sounds heard. Central nervous system: Alert and oriented. No focal neurological deficits. Extremities: Symmetric 5 x 5 power. Skin: No rashes, lesions or ulcers Psychiatry: Judgement and insight appear  normal. Mood & affect appropriate.     Data Reviewed: I have personally reviewed following labs and imaging studies  CBC: Recent Labs  Lab 11/25/17 2031 11/26/17 0619  WBC 13.9* 5.4  NEUTROABS 9.8*  --   HGB 14.9 13.6  HCT 42.8 39.8  MCV 87.7 87.7  PLT 227 270   Basic Metabolic Panel: Recent Labs  Lab  11/25/17 2031 11/26/17 0619  NA 139 137  K 3.6 3.9  CL 105 108  CO2 23 18*  GLUCOSE 103* 167*  BUN 26* 26*  CREATININE 1.07 0.89  CALCIUM 8.8* 8.2*  MG 2.3  --   PHOS 2.3*  --    GFR: Estimated Creatinine Clearance: 69 mL/min (by C-G formula based on SCr of 0.89 mg/dL). Liver Function Tests: Recent Labs  Lab 11/26/17 0619  AST 17  ALT 21  ALKPHOS 54  BILITOT 2.3*  PROT 6.2*  ALBUMIN 2.6*   No results for input(s): LIPASE, AMYLASE in the last 168 hours. No results for input(s): AMMONIA in the last 168 hours. Coagulation Profile: No results for input(s): INR, PROTIME in the last 168 hours. Cardiac Enzymes: Recent Labs  Lab 11/25/17 2031  TROPONINI <0.03   BNP (last 3 results) No results for input(s): PROBNP in the last 8760 hours. HbA1C: No results for input(s): HGBA1C in the last 72 hours. CBG: No results for input(s): GLUCAP in the last 168 hours. Lipid Profile: No results for input(s): CHOL, HDL, LDLCALC, TRIG, CHOLHDL, LDLDIRECT in the last 72 hours. Thyroid Function Tests: No results for input(s): TSH, T4TOTAL, FREET4, T3FREE, THYROIDAB in the last 72 hours. Anemia Panel: No results for input(s): VITAMINB12, FOLATE, FERRITIN, TIBC, IRON, RETICCTPCT in the last 72 hours. Sepsis Labs: No results for input(s): PROCALCITON, LATICACIDVEN in the last 168 hours.  No results found for this or any previous visit (from the past 240 hour(s)).       Radiology Studies: Dg Chest 2 View  Result Date: 11/25/2017 CLINICAL DATA:  Dyspnea since Friday. EXAM: CHEST - 2 VIEW COMPARISON:  11/18/2017 FINDINGS: The heart size and mediastinal contours are within normal limits. Minimal aortic atherosclerosis at the arch without aneurysm. Hyperinflated lungs without acute pneumonic consolidation or CHF. Small calcified granuloma at the left lung base. The visualized skeletal structures are unremarkable. IMPRESSION: Hyperinflated lungs consistent with COPD. Calcified granuloma  projects over the left lung base. Electronically Signed   By: Ashley Royalty M.D.   On: 11/25/2017 20:13   Ct Angio Chest Pe W Or Wo Contrast  Result Date: 11/26/2017 CLINICAL DATA:  Acute onset of shortness of breath. Decreased appetite and chills. EXAM: CT ANGIOGRAPHY CHEST WITH CONTRAST TECHNIQUE: Multidetector CT imaging of the chest was performed using the standard protocol during bolus administration of intravenous contrast. Multiplanar CT image reconstructions and MIPs were obtained to evaluate the vascular anatomy. CONTRAST:  163mL ISOVUE-370 IOPAMIDOL (ISOVUE-370) INJECTION 76% COMPARISON:  Chest radiograph performed 11/25/2017, and CT of the chest performed 04/22/2017 FINDINGS: Cardiovascular:  There is no evidence of pulmonary embolus. The heart is normal in size. Mild calcification is seen along the thoracic aorta and proximal great vessels. Scattered coronary artery calcifications are seen. Mediastinum/Nodes: Trace pericardial fluid remains within normal limits. Visualized mediastinal and hilar nodes are borderline normal in size. The visualized portions of the thyroid gland are unremarkable. No axillary lymphadenopathy is seen. Lungs/Pleura: An 8 mm nodule is noted at the periphery of the right upper lobe (image 64 of 144). This has increased in size from 6  mm on the prior study. Additional scattered nodules are noted within the right upper and lower lobes, and the a 5 mm pleural-based nodule is noted at the left lower lobe (image 100 of 144). Bilateral emphysema is noted. No pleural effusion or pneumothorax is seen. Upper Abdomen: The visualized portions of the liver are unremarkable. Scattered calcified granulomata are seen within the spleen. The visualized portions of the pancreas and adrenal glands are within normal limits. Musculoskeletal: No acute osseous abnormalities are identified. The visualized musculature is unremarkable in appearance. Review of the MIP images confirms the above findings.  IMPRESSION: 1. No evidence of pulmonary embolus. 2. The largest pulmonary nodule measures 8 mm, with a tail extending centrally that is new from August. It is increased in size from 6 mm in August. PET/CT could be considered for further evaluation, to help exclude malignancy. 3. Additional bilateral pulmonary nodules appear relatively stable. 4. Bilateral emphysema noted 5. Scattered coronary artery calcifications. Electronically Signed   By: Garald Balding M.D.   On: 11/26/2017 02:27   US Aorta Complete  Result Date: 11/26/2017 CLINICAL DATA:  AAA (abdominal aortic aneurysm) (HCC) I71.4 (ICD-10-CM) EXAM: ULTRASOUND OF ABDOMINAL AORTA TECHNIQUE: Ultrasound examination of the abdominal aorta was performed to evaluate for abdominal aortic aneurysm. COMPARISON:  Lumbar MRI, 09/02/2013. FINDINGS: Abdominal aortic measurements as follows: Proximal:  2.6 cm Mid:  2.0 cm Distal:  2.7 cm Right common iliac: 1.5 cm Left common iliac: 1.1 cm Distal aortic systolic flow velocity: 71 centimeters/second. IMPRESSION: 1. Distal abdominal aortic aneurysm with a anterior-posterior dimension of 2.7 cm, measured at 2.5 cm on a prior ultrasound report dated 11/06/2006. On the MRI of the lumbar spine, the distal aorta measured 2.9 cm from anterior to posterior on the sagittal view. Ectatic abdominal aorta at risk for aneurysm development. Recommend followup by ultrasound in 5 years. This recommendation follows ACR consensus guidelines: White Paper of the ACR Incidental Findings Committee II on Vascular Findings. J Am Coll Radiol 2013; 10:789-794. Electronically Signed   By: Lajean Manes M.D.   On: 11/26/2017 11:32        Scheduled Meds: . diltiazem  120 mg Oral QHS  . diltiazem  240 mg Oral Daily  . enoxaparin (LOVENOX) injection  40 mg Subcutaneous Q24H  . feeding supplement (ENSURE ENLIVE)  237 mL Oral BID BM  . guaiFENesin  600 mg Oral BID  . ipratropium  0.5 mg Nebulization TID  . levalbuterol  0.63 mg  Nebulization TID  . methylPREDNISolone (SOLU-MEDROL) injection  40 mg Intravenous Q12H  . phosphorus  250 mg Oral TID  . pregabalin  150 mg Oral TID  . tamsulosin  0.4 mg Oral Daily   Continuous Infusions:   LOS: 0 days    Time spent: 30 minutes    Rasheena Talmadge Darleen Crocker, DO [facility] Pager [phone]  If 7PM-7AM, please contact night-coverage [email or web address] Password [email or web address] 11/26/2017, 1:44 PM

## 2017-11-26 NOTE — Progress Notes (Signed)
Pt alert and oriented x4, no complaints of pain or discomfort.  Bed in low position, call bell within reach.  Bed alarms on and functioning.  Assessment done and charted.  Foley catheter inserted due to retention.  Pt tolerated well, no complaints.  Will continue to monitor and do hourly rounding throughout the shift

## 2017-11-26 NOTE — Progress Notes (Signed)
*  PRELIMINARY RESULTS* Echocardiogram 2D Echocardiogram has been performed.  [name] [name] 11/26/2017, 4:14 PM

## 2017-11-26 NOTE — Progress Notes (Signed)
Initial Nutrition Assessment  DOCUMENTATION CODES:  Severe malnutrition in context of acute illness/injury  INTERVENTION:  Continue Ensure Enlive po BID, each supplement provides 350 kcal and 20 grams of protein  Particular favorite foods patient likes will be placed on trays  Please note: patient has constipation at baseline which he controls with daily softener. Would recommend bowel regimen, especially given minimal activity level.    NUTRITION DIAGNOSIS:  Severe Malnutrition(Acute context) related to poor appetite, acute illness(AoC COPD exacerbation) as evidenced by an energy intake that is estimated to have met </= to 50% of needs for >/=5 days and a loss of >5% bw x 1 month  GOAL:  Patient will meet greater than or equal to 90% of their needs  MONITOR:  PO intake, Supplement acceptance, Labs, I & O's  REASON FOR ASSESSMENT:  Consult COPD Protocol  ASSESSMENT:  77 y/o male PMHx AAA, Prostate Cancer, COPD, HLD, Afib, DJD. Recently seein in ED 3/6 for COPD exacerbation though quickly regressed and now Presents w/ SOB, decreased appetite x4 days and chills x1 week. Admitted for COPD exacerbation.    Pt reports eating poorly for approximately 1 week. Spouse says pt has eaten "nothing" for 4 days except for "maybe a bite of potatoes or half cup of soup". At baseline, he does not take supplements. Also, MST reports his home diet is "vegetarian". He clarifies today he is NOT a vegetarian, but he "leans towards vegetables" and shies away from red meat. He does eat chicken.   Denies n/v/d. Has chronic constipation, but is well managed with a stool softener and by eating much fruit  Wt wise, he says his recent UBW has been 165 lbs. He notes he "used to weigh 185lbs" ~ 1 year ago. Per weight history in chart, the patient has acute wt loss superimposed in chronic weight decline.Marland Kitchen He was 164 lbs approximately 1 month ago. He was admitted now at 152 lbs. This is a significant loss of 12 lbs  (7%) x 1 month. Has lost 15 lbs (8.9%) x 3 months. Long term her has been slowly losing weight since 2013 when he was stable at 180 lbs.    He comments he has noticed this gradual weight loss and thought it was related to his COPD, but he says his PCP felt it was related to him being overweight before?. He says his pcp recommended a "gland workup"- (hyperthyroid?).    At this time, while he has a poor appetite, he is agreeable to Ensure. RD will also add fruit to trays since this is one item he enjoys.   Physical Assessment: Limited as pt is in tight tshirt. Has moderate muscle wasting of temporalis, interosseous and moderate fat wasting of orbitals. Mild fat wasting tricep.  Malnutrition criteria met with intake (today is 5th day meeting <50% needs)/weight loss alone. His wasting is felt to also be related to chronic COPD.   Labs: Glu:167, Albumin:2.6, Bun:26, IVF Meds: Ensure ENlive, Zofran, Phos,   Recent Labs  Lab 11/25/17 2031 11/26/17 0619  NA 139 137  K 3.6 3.9  CL 105 108  CO2 23 18*  BUN 26* 26*  CREATININE 1.07 0.89  CALCIUM 8.8* 8.2*  MG 2.3  --   PHOS 2.3*  --   GLUCOSE 103* 167*   NUTRITION - FOCUSED PHYSICAL EXAM:   Most Recent Value  Orbital Region  Moderate depletion  Upper Arm Region  Mild depletion  Thoracic and Lumbar Region  Unable to assess  Buccal  Region  Unable to assess  Temple Region  Moderate depletion  Clavicle Bone Region  Unable to assess  Clavicle and Acromion Bone Region  Unable to assess  Scapular Bone Region  Unable to assess  Dorsal Hand  Moderate depletion  Patellar Region  Moderate depletion  Anterior Thigh Region  Moderate depletion  Posterior Calf Region  Moderate depletion       Diet Order:  Diet Heart Room service appropriate? Yes; Fluid consistency: Thin  EDUCATION NEEDS:  No education needs have been identified at this time  Skin:  Skin Assessment: Reviewed RN Assessment  Last BM:  3/14  Height:  Ht Readings from Last 1  Encounters:  11/26/17 5' 10"  (1.778 m)   Weight:  Wt Readings from Last 1 Encounters:  11/26/17 152 lb 5.4 oz (69.1 kg)   Wt Readings from Last 10 Encounters:  11/26/17 152 lb 5.4 oz (69.1 kg)  11/20/17 157 lb (71.2 kg)  11/18/17 165 lb (74.8 kg)  11/03/17 158 lb 0.6 oz (71.7 kg)  10/27/17 164 lb 1.3 oz (74.4 kg)  10/06/17 161 lb (73 kg)  09/02/17 167 lb (75.8 kg)  08/12/17 168 lb (76.2 kg)  06/23/17 163 lb (73.9 kg)  12/24/16 161 lb (73 kg)   Ideal Body Weight:  75.45 kg  BMI:  Body mass index is 21.86 kg/m.  Estimated Nutritional Needs:  Kcal:  1950-2150 kcals (28-31 kcal/kg bw) Protein:  83-97g Pro (1.2-1.4 g/kg bw) Fluid:  >1.7 L (25 ml/kg bw)  Burtis Junes RD, LDN, CNSC Clinical Nutrition Pager: [phone] 11/26/2017 12:21 PM

## 2017-11-26 NOTE — Care Management Obs Status (Signed)
[facility] NOTIFICATION   Patient Details  Name: [name] [name] MRN: [id number] Date of Birth: [birth year]   Medicare Observation Status Notification Given:  Yes    Sherald Barge, RN 11/26/2017, 1:17 PM

## 2017-11-27 DIAGNOSIS — Z7951 Long term (current) use of inhaled steroids: Secondary | ICD-10-CM | POA: Diagnosis not present

## 2017-11-27 DIAGNOSIS — Z8546 Personal history of malignant neoplasm of prostate: Secondary | ICD-10-CM | POA: Diagnosis not present

## 2017-11-27 DIAGNOSIS — Z825 Family history of asthma and other chronic lower respiratory diseases: Secondary | ICD-10-CM | POA: Diagnosis not present

## 2017-11-27 DIAGNOSIS — Z9842 Cataract extraction status, left eye: Secondary | ICD-10-CM | POA: Diagnosis not present

## 2017-11-27 DIAGNOSIS — Z888 Allergy status to other drugs, medicaments and biological substances status: Secondary | ICD-10-CM | POA: Diagnosis not present

## 2017-11-27 DIAGNOSIS — Z9981 Dependence on supplemental oxygen: Secondary | ICD-10-CM | POA: Diagnosis not present

## 2017-11-27 DIAGNOSIS — J8410 Pulmonary fibrosis, unspecified: Secondary | ICD-10-CM | POA: Diagnosis present

## 2017-11-27 DIAGNOSIS — Z9841 Cataract extraction status, right eye: Secondary | ICD-10-CM | POA: Diagnosis not present

## 2017-11-27 DIAGNOSIS — D72829 Elevated white blood cell count, unspecified: Secondary | ICD-10-CM | POA: Diagnosis present

## 2017-11-27 DIAGNOSIS — J439 Emphysema, unspecified: Secondary | ICD-10-CM | POA: Diagnosis present

## 2017-11-27 DIAGNOSIS — I714 Abdominal aortic aneurysm, without rupture: Secondary | ICD-10-CM | POA: Diagnosis present

## 2017-11-27 DIAGNOSIS — E782 Mixed hyperlipidemia: Secondary | ICD-10-CM | POA: Diagnosis present

## 2017-11-27 DIAGNOSIS — N401 Enlarged prostate with lower urinary tract symptoms: Secondary | ICD-10-CM | POA: Diagnosis present

## 2017-11-27 DIAGNOSIS — I951 Orthostatic hypotension: Secondary | ICD-10-CM | POA: Diagnosis not present

## 2017-11-27 DIAGNOSIS — M47816 Spondylosis without myelopathy or radiculopathy, lumbar region: Secondary | ICD-10-CM | POA: Diagnosis present

## 2017-11-27 DIAGNOSIS — R8290 Unspecified abnormal findings in urine: Secondary | ICD-10-CM | POA: Diagnosis present

## 2017-11-27 DIAGNOSIS — I480 Paroxysmal atrial fibrillation: Secondary | ICD-10-CM | POA: Diagnosis present

## 2017-11-27 DIAGNOSIS — Z79899 Other long term (current) drug therapy: Secondary | ICD-10-CM | POA: Diagnosis not present

## 2017-11-27 DIAGNOSIS — R338 Other retention of urine: Secondary | ICD-10-CM | POA: Diagnosis present

## 2017-11-27 DIAGNOSIS — Z961 Presence of intraocular lens: Secondary | ICD-10-CM | POA: Diagnosis present

## 2017-11-27 DIAGNOSIS — Z8619 Personal history of other infectious and parasitic diseases: Secondary | ICD-10-CM | POA: Diagnosis not present

## 2017-11-27 DIAGNOSIS — Z87891 Personal history of nicotine dependence: Secondary | ICD-10-CM | POA: Diagnosis not present

## 2017-11-27 DIAGNOSIS — E43 Unspecified severe protein-calorie malnutrition: Secondary | ICD-10-CM

## 2017-11-27 DIAGNOSIS — G629 Polyneuropathy, unspecified: Secondary | ICD-10-CM | POA: Diagnosis present

## 2017-11-27 LAB — BASIC METABOLIC PANEL
Anion gap: 10 (ref 5–15)
BUN: 26 mg/dL — ABNORMAL HIGH (ref 6–20)
CO2: 24 mmol/L (ref 22–32)
CREATININE: 0.87 mg/dL (ref 0.61–1.24)
Calcium: 8.7 mg/dL — ABNORMAL LOW (ref 8.9–10.3)
Chloride: 107 mmol/L (ref 101–111)
GFR calc Af Amer: >60 mL/min (ref >60)
GFR calc non Af Amer: >60 mL/min (ref >60)
GLUCOSE: 195 mg/dL — AB (ref 65–99)
Potassium: 3.4 mmol/L — ABNORMAL LOW (ref 3.5–5.1)
Sodium: 141 mmol/L (ref 135–145)

## 2017-11-27 LAB — CBC
HEMATOCRIT: 37.1 % — AB (ref 39.0–52.0)
Hemoglobin: 12.4 g/dL — ABNORMAL LOW (ref 13.0–17.0)
MCH: 29.5 pg (ref 26.0–34.0)
MCHC: 33.4 g/dL (ref 30.0–36.0)
MCV: 88.3 fL (ref 78.0–100.0)
Platelets: 237 10*3/uL (ref 150–400)
RBC: 4.2 MIL/uL — ABNORMAL LOW (ref 4.22–5.81)
RDW: 13.7 % (ref 11.5–15.5)
WBC: 11.6 10*3/uL — ABNORMAL HIGH (ref 4.0–10.5)

## 2017-11-27 MED ORDER — LEVALBUTEROL HCL 0.63 MG/3ML IN NEBU
0.6300 mg | INHALATION_SOLUTION | Freq: Two times a day (BID) | RESPIRATORY_TRACT | Status: DC
  Administered 2017-11-27 – 2017-11-28 (×2): 0.63 mg via RESPIRATORY_TRACT
  Filled 2017-11-27 (×2): qty 3

## 2017-11-27 MED ORDER — IPRATROPIUM BROMIDE 0.02 % IN SOLN
0.5000 mg | Freq: Two times a day (BID) | RESPIRATORY_TRACT | Status: DC
  Administered 2017-11-27 – 2017-11-28 (×2): 0.5 mg via RESPIRATORY_TRACT
  Filled 2017-11-27 (×2): qty 2.5

## 2017-11-27 NOTE — Care Management (Signed)
Benefits check:  The cash price is $2,416.00 for Xopenex 0.63mg  inhalation not covered on the formulary,PA is required @ [phone] .  This price is for local and mail order pharmacies.

## 2017-11-27 NOTE — Care Management Note (Signed)
Case Management Note  Patient Details  Name: [name] [name] MRN: [id number] Date of Birth: [birth year]  Subjective/Objective:        Admitted with COPD exacerbation. Pt from home, ind with ADL's, live with spouse. Pt has no HH pta. He does have oxygen at home.             Action/Plan: DC home over weekend with self care. Benefits check for xopenex done, not on pt's part D formulary. MD aware.   Expected Discharge Date:  11/26/17               Expected Discharge Plan:  Home/Self Care  In-House Referral:  NA  Discharge planning Services  CM Consult  Post Acute Care Choice:  NA Choice offered to:  NA  Status of Service:  Completed, signed off  Sherald Barge, RN 11/27/2017, 2:48 PM

## 2017-11-27 NOTE — Progress Notes (Signed)
PROGRESS NOTE    [name] [name]  JHE:[id number] DOB: 28-Jan-1941 DOA: 11/25/2017 PCP: [name], MD   Brief Narrative:   [name] [name] is a 77 y.o. male with medical history significant of AAA, prostate cancer/status post radioactive seed implants treatment, COPD/emphysema, history of shingles, mixed hyperlipidemia, paroxysmal atrial fibrillation, peripheral neuropathy, post zoster sciatic nerve neuropathy, ureteral stricture who is brought to the emergency department due to shortness of breath with inability to take a deep breath, decreased appetite for the last 4 days associated with chills since last week.  He was diagnosed with acute COPD exacerbation as well as some mild orthostatic hypotension which have both improved.  Assessment & Plan:   Principal Problem:   Orthostatic hypotension Active Problems:   ATRIAL FIBRILLATION, PAROXYSMAL   Mixed hyperlipidemia   Neuropathy   Multiple lung nodules on CT   Abdominal aortic aneurysm (AAA) without rupture (HCC)   COPD with acute exacerbation (HCC)   Leukocytosis   Protein-calorie malnutrition, severe   1. Orthostatic hypotension-resolved.  DC IV fluid.  Home Cardizem as well as Flomax started with no further issues. 2. Acute COPD exacerbation.  Continue DuoNeb breathing treatments with Xopenex.  Solu-Medrol continue at 40 mg IV twice daily.  Plan to discharge on chronic oral steroids and follow up with pulmonology outpatient. 3. Paroxysmal atrial fibrillation.  Continue diltiazem.  Chads score of 2 and therefore patient not on anticoagulation. 4. Abdominal aortic aneurysm.  Abdominal ultrasound performed this morning with stable findings.  This is to be reassessed in 5 years. 5. Mixed dyslipidemia.  Continue lifestyle modifications. 6. Neuropathy.  Continue Lyrica 3 times daily. 7. Multiple lung nodules on CT.  Follow-up imaging and with pulmonology as scheduled. 8. Suprapubic discomfort secondary to urinary retention.  Patient  started on Flomax yesterday we will plan to discontinue Foley catheter today.   DVT prophylaxis:Lovenox Code Status: Full Family Communication: Wife/daughter at bedside Disposition Plan: COPD treatment ongoing; anticipate DC in a.m.   Consultants:   None  Procedures:   None  Antimicrobials:   None   Subjective: Patient seen and evaluated today with no new acute complaints or concerns.  He continues to complain of ongoing weakness and general malaise.  He appears to be less short of breath.  No acute overnight events noted.  Objective: Vitals:   11/26/17 2052 11/26/17 2153 11/27/17 0427 11/27/17 0906  BP: 124/68  (!) 148/76 132/65  Pulse: (!) 101 96 99   Resp: 20 16 17    Temp: 98.3 F (36.8 C)  98.2 F (36.8 C)   TempSrc: Oral  Oral   SpO2: 98% 96% 98%   Weight:      Height:        Intake/Output Summary (Last 24 hours) at 11/27/2017 1218 Last data filed at 11/27/2017 1100 Gross per 24 hour  Intake 720 ml  Output 975 ml  Net -255 ml   Filed Weights   11/25/17 1900 11/26/17 0200  Weight: 74.8 kg (165 lb) 69.1 kg (152 lb 5.4 oz)    Examination:  General exam: Appears calm and comfortable  Respiratory system: Clear to auscultation. Respiratory effort normal. On 2L [place]. Cardiovascular system: S1 & S2 heard, RRR. No JVD, murmurs, rubs, gallops or clicks. No pedal edema. Gastrointestinal system: Abdomen is nondistended, soft and nontender. No organomegaly or masses felt. Normal bowel sounds heard. Central nervous system: Alert and oriented. No focal neurological deficits. Extremities: Symmetric 5 x 5 power. Skin: No rashes, lesions or ulcers Psychiatry:  Judgement and insight appear normal. Mood & affect appropriate.     Data Reviewed: I have personally reviewed following labs and imaging studies  CBC: Recent Labs  Lab 11/25/17 2031 11/26/17 0619 11/27/17 0634  WBC 13.9* 5.4 11.6*  NEUTROABS 9.8*  --   --   HGB 14.9 13.6 12.4*  HCT 42.8 39.8 37.1*  MCV  87.7 87.7 88.3  PLT 227 212 295   Basic Metabolic Panel: Recent Labs  Lab 11/25/17 2031 11/26/17 0619 11/27/17 0634  NA 139 137 141  K 3.6 3.9 3.4*  CL 105 108 107  CO2 23 18* 24  GLUCOSE 103* 167* 195*  BUN 26* 26* 26*  CREATININE 1.07 0.89 0.87  CALCIUM 8.8* 8.2* 8.7*  MG 2.3  --   --   PHOS 2.3*  --   --    GFR: Estimated Creatinine Clearance: 70.6 mL/min (by C-G formula based on SCr of 0.87 mg/dL). Liver Function Tests: Recent Labs  Lab 11/26/17 0619  AST 17  ALT 21  ALKPHOS 54  BILITOT 2.3*  PROT 6.2*  ALBUMIN 2.6*   No results for input(s): LIPASE, AMYLASE in the last 168 hours. No results for input(s): AMMONIA in the last 168 hours. Coagulation Profile: No results for input(s): INR, PROTIME in the last 168 hours. Cardiac Enzymes: Recent Labs  Lab 11/25/17 2031  TROPONINI <0.03   BNP (last 3 results) No results for input(s): PROBNP in the last 8760 hours. HbA1C: No results for input(s): HGBA1C in the last 72 hours. CBG: No results for input(s): GLUCAP in the last 168 hours. Lipid Profile: No results for input(s): CHOL, HDL, LDLCALC, TRIG, CHOLHDL, LDLDIRECT in the last 72 hours. Thyroid Function Tests: No results for input(s): TSH, T4TOTAL, FREET4, T3FREE, THYROIDAB in the last 72 hours. Anemia Panel: No results for input(s): VITAMINB12, FOLATE, FERRITIN, TIBC, IRON, RETICCTPCT in the last 72 hours. Sepsis Labs: No results for input(s): PROCALCITON, LATICACIDVEN in the last 168 hours.  No results found for this or any previous visit (from the past 240 hour(s)).     Radiology Studies: Dg Chest 2 View  Result Date: 11/25/2017 CLINICAL DATA:  Dyspnea since Friday. EXAM: CHEST - 2 VIEW COMPARISON:  11/18/2017 FINDINGS: The heart size and mediastinal contours are within normal limits. Minimal aortic atherosclerosis at the arch without aneurysm. Hyperinflated lungs without acute pneumonic consolidation or CHF. Small calcified granuloma at the left lung  base. The visualized skeletal structures are unremarkable. IMPRESSION: Hyperinflated lungs consistent with COPD. Calcified granuloma projects over the left lung base. Electronically Signed   By: Ashley Royalty M.D.   On: 11/25/2017 20:13   Ct Angio Chest Pe W Or Wo Contrast  Result Date: 11/26/2017 CLINICAL DATA:  Acute onset of shortness of breath. Decreased appetite and chills. EXAM: CT ANGIOGRAPHY CHEST WITH CONTRAST TECHNIQUE: Multidetector CT imaging of the chest was performed using the standard protocol during bolus administration of intravenous contrast. Multiplanar CT image reconstructions and MIPs were obtained to evaluate the vascular anatomy. CONTRAST:  143mL ISOVUE-370 IOPAMIDOL (ISOVUE-370) INJECTION 76% COMPARISON:  Chest radiograph performed 11/25/2017, and CT of the chest performed 04/22/2017 FINDINGS: Cardiovascular:  There is no evidence of pulmonary embolus. The heart is normal in size. Mild calcification is seen along the thoracic aorta and proximal great vessels. Scattered coronary artery calcifications are seen. Mediastinum/Nodes: Trace pericardial fluid remains within normal limits. Visualized mediastinal and hilar nodes are borderline normal in size. The visualized portions of the thyroid gland are unremarkable. No axillary lymphadenopathy  is seen. Lungs/Pleura: An 8 mm nodule is noted at the periphery of the right upper lobe (image 64 of 144). This has increased in size from 6 mm on the prior study. Additional scattered nodules are noted within the right upper and lower lobes, and the a 5 mm pleural-based nodule is noted at the left lower lobe (image 100 of 144). Bilateral emphysema is noted. No pleural effusion or pneumothorax is seen. Upper Abdomen: The visualized portions of the liver are unremarkable. Scattered calcified granulomata are seen within the spleen. The visualized portions of the pancreas and adrenal glands are within normal limits. Musculoskeletal: No acute osseous  abnormalities are identified. The visualized musculature is unremarkable in appearance. Review of the MIP images confirms the above findings. IMPRESSION: 1. No evidence of pulmonary embolus. 2. The largest pulmonary nodule measures 8 mm, with a tail extending centrally that is new from August. It is increased in size from 6 mm in August. PET/CT could be considered for further evaluation, to help exclude malignancy. 3. Additional bilateral pulmonary nodules appear relatively stable. 4. Bilateral emphysema noted 5. Scattered coronary artery calcifications. Electronically Signed   By: Garald Balding M.D.   On: 11/26/2017 02:27   US Aorta Complete  Result Date: 11/26/2017 CLINICAL DATA:  AAA (abdominal aortic aneurysm) (HCC) I71.4 (ICD-10-CM) EXAM: ULTRASOUND OF ABDOMINAL AORTA TECHNIQUE: Ultrasound examination of the abdominal aorta was performed to evaluate for abdominal aortic aneurysm. COMPARISON:  Lumbar MRI, 09/02/2013. FINDINGS: Abdominal aortic measurements as follows: Proximal:  2.6 cm Mid:  2.0 cm Distal:  2.7 cm Right common iliac: 1.5 cm Left common iliac: 1.1 cm Distal aortic systolic flow velocity: 71 centimeters/second. IMPRESSION: 1. Distal abdominal aortic aneurysm with a anterior-posterior dimension of 2.7 cm, measured at 2.5 cm on a prior ultrasound report dated 11/06/2006. On the MRI of the lumbar spine, the distal aorta measured 2.9 cm from anterior to posterior on the sagittal view. Ectatic abdominal aorta at risk for aneurysm development. Recommend followup by ultrasound in 5 years. This recommendation follows ACR consensus guidelines: White Paper of the ACR Incidental Findings Committee II on Vascular Findings. J Am Coll Radiol 2013; 10:789-794. Electronically Signed   By: Lajean Manes M.D.   On: 11/26/2017 11:32      Scheduled Meds: . diltiazem  120 mg Oral QHS  . diltiazem  240 mg Oral Daily  . enoxaparin (LOVENOX) injection  40 mg Subcutaneous Q24H  . feeding supplement (ENSURE  ENLIVE)  237 mL Oral BID BM  . guaiFENesin  600 mg Oral BID  . ipratropium  0.5 mg Nebulization BID  . levalbuterol  0.63 mg Nebulization BID  . methylPREDNISolone (SOLU-MEDROL) injection  40 mg Intravenous Q12H  . pregabalin  150 mg Oral TID  . tamsulosin  0.4 mg Oral Daily   Continuous Infusions:   LOS: 0 days    Time spent: 30 minutes    Hector Taft Darleen Crocker, DO [facility] Pager [phone]  If 7PM-7AM, please contact night-coverage [email or web address] Password [facility] 11/27/2017, 12:18 PM

## 2017-11-28 MED ORDER — TAMSULOSIN HCL 0.4 MG PO CAPS: 0.4000 mg | ORAL_CAPSULE | Freq: Every day | ORAL | 0 refills | Status: DC

## 2017-11-28 MED ORDER — PREDNISONE 10 MG PO TABS: 40.0000 mg | ORAL_TABLET | Freq: Every day | ORAL | 0 refills | Status: AC

## 2017-11-28 MED ORDER — IPRATROPIUM BROMIDE 0.02 % IN SOLN: 0.5000 mg | Freq: Four times a day (QID) | RESPIRATORY_TRACT | 12 refills | Status: DC | PRN

## 2017-11-28 NOTE — Discharge Summary (Signed)
Physician Discharge Summary  [name] [name] LKG:[id number] DOB: 09/17/1940 DOA: 11/25/2017  PCP: [name] Everts, MD  Admit date: 11/25/2017  Discharge date: 11/28/2017  Admitted From: Home  Disposition: Home  Recommendations for Outpatient Follow-up:  1. Follow up with PCP in 1-2 weeks 2. Follow-up with pulmonologist Dr. Lamonte Sakai in 2 weeks  Home Health: None  Equipment/Devices: Portable oxygen concentrator to be set up by PCP  Discharge Condition:Stable  CODE STATUS: Full  Diet recommendation: Heart Healthy  Brief/Interim Summary:  [name] Blouch Catesis a 77 y.o.malewith medical history significant ofAAA,prostate cancer/status post radioactive seed implants treatment, COPD/emphysema, history of shingles, mixed hyperlipidemia, paroxysmal atrial fibrillation, peripheral neuropathy, post zoster sciatic nerve neuropathy, ureteral stricture who is brought to the emergency department due to shortness of breath with inability to take a deep breath, decreased appetite for the last 4 days associated with chillssince last week.  He was diagnosed with acute COPD exacerbation as well as some mild orthostatic hypotension which have both subsequently improved.  He was treated with breathing treatments to include Xopenex, but due to high cost of this in the outpatient setting, has been recommended to simply remain on Atrovent nebulizer treatments and use albuterol inhaler as needed which he has at home.  He will be discharged with prednisone for 7 more days with close follow-up to his pulmonologist Dr. Lamonte Sakai.  Additionally, he did have an episode of urinary retention with temporary Foley catheter use for which he required some Flomax and has been discharged on this as well, but likely will not require this in the long-term.  Finally, patient requested a portable oxygen concentrator, but according to care management this needs to be set up by primary care provider and/or pulmonologist and will be arranged  in the outpatient setting.  Of note, CT scan of pulmonary nodules appear to remain stable and will require follow-up as noted below.   Discharge Diagnoses:  Principal Problem:   Orthostatic hypotension Active Problems:   ATRIAL FIBRILLATION, PAROXYSMAL   Mixed hyperlipidemia   Neuropathy   Multiple lung nodules on CT   Abdominal aortic aneurysm (AAA) without rupture (HCC)   COPD with acute exacerbation (HCC)   Leukocytosis   Protein-calorie malnutrition, severe  1. Orthostatic hypotension-resolved after volume repletion. Home Cardizem as well as Flomax started with no further issues. 2. Acute COPD exacerbation.  Continue on breathing treatments with Atrovent at home as needed.  Continue Spiriva as well as Symbicort and patient has home oxygen.  As noted above, concentrator to be set up in the outpatient setting. 3. Paroxysmal atrial fibrillation.  Continue diltiazem.  Chads score of 2 and therefore patient not on anticoagulation.  She has remained rate controlled. 4. Abdominal aortic aneurysm.  Abdominal ultrasound performed this morning with stable findings.  This is to be reassessed in 5 years.  See study below. 5. Mixed dyslipidemia.  Continue lifestyle modifications. 6. Neuropathy.  Continue Lyrica 3 times daily. 7. Multiple lung nodules on CT.  Follow-up imaging and with pulmonology as scheduled.  See study below. 8. Suprapubic discomfort secondary to urinary retention.   Patient has prior history of prostate cancer and this is likely related to BPH. Patient started on Flomax yesterday which he is to remain on at this time.  Foley catheter has been discontinued.    Discharge Instructions  Discharge Instructions    Diet - low sodium heart healthy   Complete by:  As directed    Increase activity slowly   Complete by:  As  directed      Allergies as of 11/28/2017      Reactions   Albuterol Other (See Comments)   Caused extreme tachycardia. If used by continuous infusion.  May  use the inhaler      Medication List    STOP taking these medications   albuterol (2.5 MG/3ML) 0.083% nebulizer solution Commonly known as:  PROVENTIL   albuterol 108 (90 Base) MCG/ACT inhaler Commonly known as:  PROVENTIL HFA;VENTOLIN HFA   azithromycin 250 MG tablet Commonly known as:  ZITHROMAX     TAKE these medications   ACAPELLA Misc Use as directed   AEROCHAMBER MV inhaler Use as instructed   budesonide-formoterol 160-4.5 MCG/ACT inhaler Commonly known as:  SYMBICORT Inhale 2 puffs into the lungs 2 (two) times daily.   diltiazem 120 MG 24 hr capsule Commonly known as:  DILT-XR TAKE 2 CAPSULES BY MOUTH ONCE DAILY IN THE MORNING AND 1 CAPSULE ONCE DAILY IN THE EVENING What changed:    how much to take  how to take this  when to take this  additional instructions   ipratropium 0.02 % nebulizer solution Commonly known as:  ATROVENT Take 2.5 mLs (0.5 mg total) by nebulization every 6 (six) hours as needed for wheezing or shortness of breath.   LYRICA 150 MG capsule Generic drug:  pregabalin TAKE ONE CAPSULE BY MOUTH THREE TIMES DAILY   Oxycodone HCl 10 MG Tabs Take 1 mg by mouth every 6 (six) hours as needed (pain).   predniSONE 10 MG tablet Commonly known as:  DELTASONE Take 4 tablets (40 mg total) by mouth daily for 7 days.   SPIRIVA RESPIMAT 2.5 MCG/ACT Aers Generic drug:  Tiotropium Bromide Monohydrate INHALE TWO PUFFS BY MOUTH ONCE DAILY   tamsulosin 0.4 MG Caps capsule Commonly known as:  FLOMAX Take 1 capsule (0.4 mg total) by mouth daily. Start taking on:  11/29/2017            Durable Medical Equipment  (From admission, onward)        Start     Ordered   11/27/17 0831  For home use only DME Other see comment  Once     11/27/17 0830     Follow-up Information    [name] Everts, MD Follow up in 1 week(s).   Specialty:  Family Medicine Contact information: [address] [place] [address] [phone]         Collene Gobble, MD Follow up in 2 day(s).   Specialty:  Pulmonary Disease Contact information: [address] [name] [address] [phone]          Allergies  Allergen Reactions  . Albuterol Other (See Comments)    Caused extreme tachycardia. If used by continuous infusion.  May use the inhaler    Consultations:  None   Procedures/Studies: Dg Chest 2 View  Result Date: 11/25/2017 CLINICAL DATA:  Dyspnea since Friday. EXAM: CHEST - 2 VIEW COMPARISON:  11/18/2017 FINDINGS: The heart size and mediastinal contours are within normal limits. Minimal aortic atherosclerosis at the arch without aneurysm. Hyperinflated lungs without acute pneumonic consolidation or CHF. Small calcified granuloma at the left lung base. The visualized skeletal structures are unremarkable. IMPRESSION: Hyperinflated lungs consistent with COPD. Calcified granuloma projects over the left lung base. Electronically Signed   By: Ashley Royalty M.D.   On: 11/25/2017 20:13   Dg Chest 2 View  Result Date: 11/18/2017 CLINICAL DATA:  77 year old male with shortness of breath  for the past day EXAM: CHEST - 2 VIEW COMPARISON:  Prior chest x-ray 11/03/2016 FINDINGS: Stable cardiac and mediastinal contours. Trace atherosclerotic calcifications present in the transverse aorta. The lungs remain hyperinflated. Increased retrosternal clear space on the lateral view. Stable calcified granuloma in the left lower lobe. No pneumothorax, pleural effusion or pulmonary edema. No acute osseous abnormality. IMPRESSION: 1. Pulmonary hyperinflation suggests underlying COPD. 2. No acute cardiopulmonary process. 3.  Aortic Atherosclerosis (ICD10-170.0) Electronically Signed   By: Jacqulynn Cadet M.D.   On: 11/18/2017 15:51   Dg Chest 2 View  Result Date: 11/03/2017 CLINICAL DATA:  Shortness of breath. EXAM: CHEST  2 VIEW COMPARISON:  08/12/2017.  09/18/2016.  CT 04/22/2017. FINDINGS: Mediastinum and hilar structures normal. Lungs  are clear of acute infiltrates. Tiny pulmonary nodules again appear to be present. Similar findings noted on prior CT of 04/22/2017. Reference is made to that report. No prominent pulmonary nodule or mass lesion noted. No pleural effusion or pneumothorax. Heart size normal. Diffuse osteopenia degenerative change. IMPRESSION: 1.  No acute cardiopulmonary disease. 2. Stable tiny pulmonary nodules both lungs. Reference is made to prior CT report of 04/22/2017. Electronically Signed   By: Marcello Moores  Register   On: 11/03/2017 13:32   Ct Angio Chest Pe W Or Wo Contrast  Result Date: 11/26/2017 CLINICAL DATA:  Acute onset of shortness of breath. Decreased appetite and chills. EXAM: CT ANGIOGRAPHY CHEST WITH CONTRAST TECHNIQUE: Multidetector CT imaging of the chest was performed using the standard protocol during bolus administration of intravenous contrast. Multiplanar CT image reconstructions and MIPs were obtained to evaluate the vascular anatomy. CONTRAST:  172mL ISOVUE-370 IOPAMIDOL (ISOVUE-370) INJECTION 76% COMPARISON:  Chest radiograph performed 11/25/2017, and CT of the chest performed 04/22/2017 FINDINGS: Cardiovascular:  There is no evidence of pulmonary embolus. The heart is normal in size. Mild calcification is seen along the thoracic aorta and proximal great vessels. Scattered coronary artery calcifications are seen. Mediastinum/Nodes: Trace pericardial fluid remains within normal limits. Visualized mediastinal and hilar nodes are borderline normal in size. The visualized portions of the thyroid gland are unremarkable. No axillary lymphadenopathy is seen. Lungs/Pleura: An 8 mm nodule is noted at the periphery of the right upper lobe (image 64 of 144). This has increased in size from 6 mm on the prior study. Additional scattered nodules are noted within the right upper and lower lobes, and the a 5 mm pleural-based nodule is noted at the left lower lobe (image 100 of 144). Bilateral emphysema is noted. No  pleural effusion or pneumothorax is seen. Upper Abdomen: The visualized portions of the liver are unremarkable. Scattered calcified granulomata are seen within the spleen. The visualized portions of the pancreas and adrenal glands are within normal limits. Musculoskeletal: No acute osseous abnormalities are identified. The visualized musculature is unremarkable in appearance. Review of the MIP images confirms the above findings. IMPRESSION: 1. No evidence of pulmonary embolus. 2. The largest pulmonary nodule measures 8 mm, with a tail extending centrally that is new from August. It is increased in size from 6 mm in August. PET/CT could be considered for further evaluation, to help exclude malignancy. 3. Additional bilateral pulmonary nodules appear relatively stable. 4. Bilateral emphysema noted 5. Scattered coronary artery calcifications. Electronically Signed   By: Garald Balding M.D.   On: 11/26/2017 02:27   US Aorta Complete  Result Date: 11/26/2017 CLINICAL DATA:  AAA (abdominal aortic aneurysm) (HCC) I71.4 (ICD-10-CM) EXAM: ULTRASOUND OF ABDOMINAL AORTA TECHNIQUE: Ultrasound examination of the abdominal aorta was performed  to evaluate for abdominal aortic aneurysm. COMPARISON:  Lumbar MRI, 09/02/2013. FINDINGS: Abdominal aortic measurements as follows: Proximal:  2.6 cm Mid:  2.0 cm Distal:  2.7 cm Right common iliac: 1.5 cm Left common iliac: 1.1 cm Distal aortic systolic flow velocity: 71 centimeters/second. IMPRESSION: 1. Distal abdominal aortic aneurysm with a anterior-posterior dimension of 2.7 cm, measured at 2.5 cm on a prior ultrasound report dated 11/06/2006. On the MRI of the lumbar spine, the distal aorta measured 2.9 cm from anterior to posterior on the sagittal view. Ectatic abdominal aorta at risk for aneurysm development. Recommend followup by ultrasound in 5 years. This recommendation follows ACR consensus guidelines: White Paper of the ACR Incidental Findings Committee II on Vascular  Findings. J Am Coll Radiol 2013; 10:789-794. Electronically Signed   By: Lajean Manes M.D.   On: 11/26/2017 11:32     Discharge Exam: Vitals:   11/28/17 0844 11/28/17 0941  BP:    Pulse:    Resp:    Temp:    SpO2: 97% 97%   Vitals:   11/27/17 2100 11/28/17 0600 11/28/17 0844 11/28/17 0941  BP: (!) 138/54     Pulse: 94     Resp: 18 17    Temp: 97.7 F (36.5 C) (!) 97.4 F (36.3 C)    TempSrc: Oral Oral    SpO2: 97% 98% 97% 97%  Weight:      Height:        General: Pt is alert, awake, not in acute distress Cardiovascular: RRR, S1/S2 +, no rubs, no gallops Respiratory: CTA bilaterally, no wheezing, no rhonchi Abdominal: Soft, NT, ND, bowel sounds + Extremities: no edema, no cyanosis    The results of significant diagnostics from this hospitalization (including imaging, microbiology, ancillary and laboratory) are listed below for reference.     Microbiology: No results found for this or any previous visit (from the past 240 hour(s)).   Labs: BNP (last 3 results) Recent Labs    08/12/17 1355 11/18/17 1930 11/25/17 2031  BNP 29.0 40.0 22.6   Basic Metabolic Panel: Recent Labs  Lab 11/25/17 2031 11/26/17 0619 11/27/17 0634  NA 139 137 141  K 3.6 3.9 3.4*  CL 105 108 107  CO2 23 18* 24  GLUCOSE 103* 167* 195*  BUN 26* 26* 26*  CREATININE 1.07 0.89 0.87  CALCIUM 8.8* 8.2* 8.7*  MG 2.3  --   --   PHOS 2.3*  --   --    Liver Function Tests: Recent Labs  Lab 11/26/17 0619  AST 17  ALT 21  ALKPHOS 54  BILITOT 2.3*  PROT 6.2*  ALBUMIN 2.6*   No results for input(s): LIPASE, AMYLASE in the last 168 hours. No results for input(s): AMMONIA in the last 168 hours. CBC: Recent Labs  Lab 11/25/17 2031 11/26/17 0619 11/27/17 0634  WBC 13.9* 5.4 11.6*  NEUTROABS 9.8*  --   --   HGB 14.9 13.6 12.4*  HCT 42.8 39.8 37.1*  MCV 87.7 87.7 88.3  PLT 227 212 237   Cardiac Enzymes: Recent Labs  Lab 11/25/17 2031  TROPONINI <0.03   BNP: Invalid  input(s): POCBNP CBG: No results for input(s): GLUCAP in the last 168 hours. D-Dimer No results for input(s): DDIMER in the last 72 hours. Hgb A1c No results for input(s): HGBA1C in the last 72 hours. Lipid Profile No results for input(s): CHOL, HDL, LDLCALC, TRIG, CHOLHDL, LDLDIRECT in the last 72 hours. Thyroid function studies No results for input(s): TSH, T4TOTAL,  T3FREE, THYROIDAB in the last 72 hours.  Invalid input(s): FREET3 Anemia work up No results for input(s): VITAMINB12, FOLATE, FERRITIN, TIBC, IRON, RETICCTPCT in the last 72 hours. Urinalysis    Component Value Date/Time   COLORURINE YELLOW 11/26/2017 0837   APPEARANCEUR CLEAR 11/26/2017 0837   LABSPEC >1.046 (H) 11/26/2017 0837   PHURINE 5.0 11/26/2017 0837   GLUCOSEU NEGATIVE 11/26/2017 0837   HGBUR NEGATIVE 11/26/2017 0837   BILIRUBINUR NEGATIVE 11/26/2017 0837   KETONESUR 20 (A) 11/26/2017 0837   PROTEINUR NEGATIVE 11/26/2017 0837   NITRITE NEGATIVE 11/26/2017 0837   LEUKOCYTESUR NEGATIVE 11/26/2017 0837   Sepsis Labs Invalid input(s): PROCALCITONIN,  WBC,  LACTICIDVEN Microbiology No results found for this or any previous visit (from the past 240 hour(s)).   Time coordinating discharge: Over 30 minutes  SIGNED:   Rodena Goldmann, DO [facility] 11/28/2017, 11:53 AM Pager [phone]  If 7PM-7AM, please contact night-coverage [email or web address] Password [email or web address]

## 2017-11-30 ENCOUNTER — Telehealth: Payer: Self-pay | Admitting: Emergency Medicine

## 2017-11-30 ENCOUNTER — Other Ambulatory Visit: Payer: Self-pay | Admitting: Family Medicine

## 2017-11-30 MED ORDER — IPRATROPIUM BROMIDE 0.02 % IN SOLN: 0.5000 mg | Freq: Four times a day (QID) | RESPIRATORY_TRACT | 1 refills | Status: DC | PRN

## 2017-11-30 MED ORDER — IPRATROPIUM BROMIDE 0.02 % IN SOLN: 0.5000 mg | Freq: Four times a day (QID) | RESPIRATORY_TRACT | 12 refills | Status: DC | PRN

## 2017-11-30 NOTE — Telephone Encounter (Signed)
Recent admission 11/25/17 for COPD exacerbation.  Pt is requesting an HFU this week. I have offered pt an apt for today with RB and Friday with TP, pt declined both apt.    Pt states breathing has slightly improved since d/c. Pt reports of sob.   Denies cough, wheezing or chest discomfort.  Doing neb treatments q6h with improvement in sob.   RB please advise. Thanks

## 2017-11-30 NOTE — Telephone Encounter (Signed)
TOC call completed. He will come in Friday at 8 for his TOC visit. He is asking for atrovent refill for his nebulizer. He uses [facility].

## 2017-11-30 NOTE — Telephone Encounter (Signed)
He wants an appointment but cannot come at the times we have offered, is that question?

## 2017-11-30 NOTE — Telephone Encounter (Signed)
I would not double book. Would find him the next available with me or an APP that he can fit into his schedule.

## 2017-11-30 NOTE — Telephone Encounter (Signed)
Yes can we double book somewhere

## 2017-11-30 NOTE — Telephone Encounter (Signed)
Spoke with the pt and scheduled hfu with TP for 12/08/17  I refilled his atrovent per his request Nothing further needed

## 2017-11-30 NOTE — Telephone Encounter (Signed)
Transition Care Management Follow-up Telephone Call   Date discharged?  11/28/17              How have you been since you were released from the hospital? Patient states he can not walk from living room to bathroom without getting winded.   Do you understand why you were in the hospital? Yes   Do you understand the discharge instructions? Yes   Where were you discharged to? Home   Items Reviewed:  Medications reviewed: Yes  Allergies reviewed: Yes  Dietary changes reviewed: Yes  Referrals reviewed: Yes   Functional Questionnaire:   Activities of Daily Living (ADLs):  Independent    Any transportation issues/concerns?: No   Any patient concerns? Oxygen- also needs atrovent   Confirmed importance and date/time of follow-up visits scheduled    Yes  Confirmed with patient if condition begins to worsen call PCP or go to the ER.  Patient was given the office number and encouraged to call back with question or concerns.  : Yes

## 2017-12-04 ENCOUNTER — Encounter: Payer: Self-pay | Admitting: Family Medicine

## 2017-12-04 ENCOUNTER — Ambulatory Visit (INDEPENDENT_AMBULATORY_CARE_PROVIDER_SITE_OTHER): Payer: Medicare Other | Admitting: Family Medicine

## 2017-12-04 VITALS — BP 140/70 | HR 103 | Resp 18 | Ht 70.0 in | Wt 154.0 lb

## 2017-12-04 DIAGNOSIS — J449 Chronic obstructive pulmonary disease, unspecified: Secondary | ICD-10-CM | POA: Diagnosis not present

## 2017-12-04 MED ORDER — PREDNISONE 10 MG PO TABS
ORAL_TABLET | ORAL | 0 refills | Status: DC
Start: 2017-12-04 — End: 2017-12-10

## 2017-12-04 NOTE — Patient Instructions (Signed)
When you stop the 40 mg prednisone, go to 10 mg a day take this for 2 weeks Then reduce to 5 mg a day See me in one month  I will begin process of getting you a condenser for oxygen

## 2017-12-04 NOTE — Progress Notes (Signed)
Chief Complaint  Patient presents with  . Transitions Of Care   Since I saw patient last he has had 2 hospital visits for his COPD exacerbation.  He states that he is not "bouncing back" from these illnesses.  He is unable to use albuterol because of tachycardia so he goes to the emergency room in order to get Xopenex.  He is currently on prednisone 20 mg twice daily.  He still has some shortness of breath and coughing.  He worries that when he stops his prednisone tomorrow that he will have increased shortness of breath.  He is under the care of pulmonary medicine.  He saw the pulmonary doctor 2 weeks ago.  He is maximized on his inhalers.  He has multiple drug intolerance.  He continues to have coughing, sputum, shortness of breath, weakness.  He has been eating little.  He has been losing weight.  He is trying to drink enough fluids and is drinking Ensure supplements.  He feels lousy.  He has oxygen concentrator at home that he uses.  He has oxygen tanks for portable use.  He states the takes are too large and heavy for him to manage given his current level of debility.  I will order an oxygen concentrator unit, portable, for him.  Patient Active Problem List   Diagnosis Date Noted  . Protein-calorie malnutrition, severe 11/27/2017  . Orthostatic hypotension 11/25/2017  . COPD with acute exacerbation ([place]) 11/25/2017  . Leukocytosis 11/25/2017  . Abdominal aortic aneurysm (AAA) without rupture ([place]) 10/27/2017  . Multiple lung nodules on CT 05/14/2016  . COPD, severe ([place]) 02/19/2016  . Emphysema of lung ([place]) 02/19/2016  . Personal history of prostate cancer   . Mixed hyperlipidemia   . DJD (degenerative joint disease), lumbar   . Neuropathy   . Back pain   . 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 ATRIAL FIBRILLATION, PAROXYSMAL 08/31/2007    Outpatient Encounter Medications as of 12/04/2017  Medication Sig  . budesonide-formoterol (SYMBICORT) 160-4.5 MCG/ACT inhaler Inhale 2  puffs into the lungs 2 (two) times daily.  Marland Kitchen diltiazem (DILT-XR) 120 MG 24 hr capsule TAKE 2 CAPSULES BY MOUTH ONCE DAILY IN THE MORNING AND 1 CAPSULE ONCE DAILY IN THE EVENING (Patient taking differently: Take 120 mg by mouth See admin instructions. TAKE 2 CAPSULES BY MOUTH ONCE DAILY IN THE MORNING AND 1 CAPSULE ONCE DAILY IN THE EVENING)  . ipratropium (ATROVENT) 0.02 % nebulizer solution Take 2.5 mLs (0.5 mg total) by nebulization every 6 (six) hours as needed for wheezing or shortness of breath.  Marland Kitchen LYRICA 150 MG capsule TAKE ONE CAPSULE BY MOUTH THREE TIMES DAILY  . Misc. Devices (ACAPELLA) MISC Use as directed  . Oxycodone HCl 10 MG TABS Take 1 mg by mouth every 6 (six) hours as needed (pain).   . predniSONE (DELTASONE) 10 MG tablet Take 4 tablets (40 mg total) by mouth daily for 7 days.  Marland Kitchen Spacer/Aero-Holding Chambers (AEROCHAMBER MV) inhaler Use as instructed  . SPIRIVA RESPIMAT 2.5 MCG/ACT AERS INHALE TWO PUFFS BY MOUTH ONCE DAILY  . tamsulosin (FLOMAX) 0.4 MG CAPS capsule Take 1 capsule (0.4 mg total) by mouth daily.  . predniSONE (DELTASONE) 10 MG tablet Take one a day for 2 weeks then go to 1/2 tab a day until you return   No facility-administered encounter medications on file as of 12/04/2017.     Allergies  Allergen Reactions  . Albuterol Other (See Comments)    Caused extreme tachycardia.  If used by continuous infusion.  May use the inhaler    Review of Systems  Constitutional: Negative for activity change, appetite change and fatigue.  HENT: Negative for congestion, dental problem and postnasal drip.   Eyes: Negative for photophobia and visual disturbance.  Respiratory: Positive for cough and shortness of breath.        Known COPD  Cardiovascular: Negative for chest pain, palpitations and leg swelling.  Gastrointestinal: Negative for abdominal distention and constipation.  Genitourinary: Negative for difficulty urinating and frequency.  Musculoskeletal: Positive for  arthralgias and back pain.  Skin: Negative for wound.  Neurological: Negative for light-headedness and headaches.  Psychiatric/Behavioral: Negative for dysphoric mood and self-injury. The patient is not nervous/anxious.     BP 140/70   Pulse (!) 103   Resp 18   Ht 5\' 10"  (1.778 m)   Wt 154 lb (69.9 kg)   SpO2 94%   BMI 22.10 kg/m   Physical Exam  Constitutional: He is oriented to person, place, and time. He appears well-developed and well-nourished. He appears distressed.  Appears frail.  Tired.  Dyspneic  HENT:  Head: Normocephalic and atraumatic.  Right Ear: External ear normal.  Left Ear: External ear normal.  Mouth/Throat: Oropharynx is clear and moist.  Eyes: Pupils are equal, round, and reactive to light. Conjunctivae are normal.  Neck: Normal range of motion. Neck supple. No thyromegaly present.  Cardiovascular: Normal rate, regular rhythm and normal heart sounds.  Pulmonary/Chest: Effort normal. No respiratory distress.  Decreased breath sounds.  Scattered inspiratory wheeze.  No rales  Abdominal: Soft. Bowel sounds are normal.  Musculoskeletal: Normal range of motion. He exhibits no edema.  Lymphadenopathy:    He has no cervical adenopathy.  Neurological: He is alert and oriented to person, place, and time.  Gait normal  Skin: Skin is warm and dry.  Solar damage.  Multiple ecchymosis  Psychiatric: He has a normal mood and affect. His behavior is normal. Thought content normal.  Nursing note and vitals reviewed.   ASSESSMENT/PLAN:  1. COPD with chronic bronchitis and emphysema ([place]) Who can live him on prednisone a bit longer.  With his recurring ER visits and continued shortness of breath I feel like he needs a slower taper than just 5-7 days.  He does have a follow-up scheduled with pulmonary medicine.  I will see him in a month.  We discussed the importance of trying to incorporate protein into his diet.  His wife is here with him.  She will offer him frequent  small meals. - DME Other see comment   Patient Instructions  When you stop the 40 mg prednisone, go to 10 mg a day take this for 2 weeks Then reduce to 5 mg a day See me in one month  I will begin process of getting you a condenser for oxygen   [name], MD

## 2017-12-07 ENCOUNTER — Encounter (HOSPITAL_COMMUNITY): Payer: Self-pay | Admitting: *Deleted

## 2017-12-07 ENCOUNTER — Encounter: Payer: Self-pay | Admitting: Pulmonary Disease

## 2017-12-07 ENCOUNTER — Ambulatory Visit (INDEPENDENT_AMBULATORY_CARE_PROVIDER_SITE_OTHER): Payer: Medicare Other | Admitting: Pulmonary Disease

## 2017-12-07 ENCOUNTER — Inpatient Hospital Stay (HOSPITAL_COMMUNITY)
Admission: EM | Admit: 2017-12-07 | Discharge: 2017-12-10 | DRG: 871 | Disposition: A | Discharge status: Home / Self Care | Payer: Medicare Other | Attending: Internal Medicine | Admitting: Internal Medicine

## 2017-12-07 ENCOUNTER — Emergency Department (HOSPITAL_COMMUNITY): Payer: Medicare Other

## 2017-12-07 VITALS — BP 90/60 | HR 85 | Temp 97.0°F | Ht 70.0 in | Wt 152.0 lb

## 2017-12-07 DIAGNOSIS — E43 Unspecified severe protein-calorie malnutrition: Secondary | ICD-10-CM | POA: Diagnosis present

## 2017-12-07 DIAGNOSIS — A419 Sepsis, unspecified organism: Principal | ICD-10-CM | POA: Diagnosis present

## 2017-12-07 DIAGNOSIS — Z66 Do not resuscitate: Secondary | ICD-10-CM | POA: Diagnosis present

## 2017-12-07 DIAGNOSIS — J440 Chronic obstructive pulmonary disease with acute lower respiratory infection: Secondary | ICD-10-CM | POA: Diagnosis not present

## 2017-12-07 DIAGNOSIS — J189 Pneumonia, unspecified organism: Secondary | ICD-10-CM | POA: Diagnosis present

## 2017-12-07 DIAGNOSIS — G629 Polyneuropathy, unspecified: Secondary | ICD-10-CM | POA: Diagnosis present

## 2017-12-07 DIAGNOSIS — I959 Hypotension, unspecified: Secondary | ICD-10-CM | POA: Diagnosis not present

## 2017-12-07 DIAGNOSIS — I480 Paroxysmal atrial fibrillation: Secondary | ICD-10-CM | POA: Diagnosis present

## 2017-12-07 DIAGNOSIS — G8929 Other chronic pain: Secondary | ICD-10-CM | POA: Diagnosis present

## 2017-12-07 DIAGNOSIS — M1990 Unspecified osteoarthritis, unspecified site: Secondary | ICD-10-CM | POA: Diagnosis present

## 2017-12-07 DIAGNOSIS — Z7952 Long term (current) use of systemic steroids: Secondary | ICD-10-CM

## 2017-12-07 DIAGNOSIS — Z923 Personal history of irradiation: Secondary | ICD-10-CM | POA: Diagnosis not present

## 2017-12-07 DIAGNOSIS — J9601 Acute respiratory failure with hypoxia: Secondary | ICD-10-CM | POA: Diagnosis not present

## 2017-12-07 DIAGNOSIS — Y95 Nosocomial condition: Secondary | ICD-10-CM | POA: Diagnosis present

## 2017-12-07 DIAGNOSIS — E440 Moderate protein-calorie malnutrition: Secondary | ICD-10-CM

## 2017-12-07 DIAGNOSIS — I4891 Unspecified atrial fibrillation: Secondary | ICD-10-CM | POA: Diagnosis present

## 2017-12-07 DIAGNOSIS — Z87891 Personal history of nicotine dependence: Secondary | ICD-10-CM

## 2017-12-07 DIAGNOSIS — Z8619 Personal history of other infectious and parasitic diseases: Secondary | ICD-10-CM

## 2017-12-07 DIAGNOSIS — R402413 Glasgow coma scale score 13-15, at hospital admission: Secondary | ICD-10-CM | POA: Diagnosis present

## 2017-12-07 DIAGNOSIS — J439 Emphysema, unspecified: Secondary | ICD-10-CM | POA: Diagnosis present

## 2017-12-07 DIAGNOSIS — J9621 Acute and chronic respiratory failure with hypoxia: Secondary | ICD-10-CM | POA: Diagnosis present

## 2017-12-07 DIAGNOSIS — N401 Enlarged prostate with lower urinary tract symptoms: Secondary | ICD-10-CM | POA: Diagnosis not present

## 2017-12-07 DIAGNOSIS — J449 Chronic obstructive pulmonary disease, unspecified: Secondary | ICD-10-CM | POA: Diagnosis present

## 2017-12-07 DIAGNOSIS — Z8546 Personal history of malignant neoplasm of prostate: Secondary | ICD-10-CM

## 2017-12-07 DIAGNOSIS — N320 Bladder-neck obstruction: Secondary | ICD-10-CM | POA: Diagnosis present

## 2017-12-07 DIAGNOSIS — Z79899 Other long term (current) drug therapy: Secondary | ICD-10-CM

## 2017-12-07 DIAGNOSIS — I714 Abdominal aortic aneurysm, without rupture: Secondary | ICD-10-CM | POA: Diagnosis present

## 2017-12-07 DIAGNOSIS — J209 Acute bronchitis, unspecified: Secondary | ICD-10-CM

## 2017-12-07 DIAGNOSIS — I5032 Chronic diastolic (congestive) heart failure: Secondary | ICD-10-CM | POA: Diagnosis present

## 2017-12-07 DIAGNOSIS — Z9981 Dependence on supplemental oxygen: Secondary | ICD-10-CM

## 2017-12-07 DIAGNOSIS — R339 Retention of urine, unspecified: Secondary | ICD-10-CM | POA: Diagnosis present

## 2017-12-07 DIAGNOSIS — E782 Mixed hyperlipidemia: Secondary | ICD-10-CM | POA: Diagnosis present

## 2017-12-07 DIAGNOSIS — Z7951 Long term (current) use of inhaled steroids: Secondary | ICD-10-CM

## 2017-12-07 DIAGNOSIS — D72829 Elevated white blood cell count, unspecified: Secondary | ICD-10-CM | POA: Diagnosis present

## 2017-12-07 DIAGNOSIS — Z888 Allergy status to other drugs, medicaments and biological substances status: Secondary | ICD-10-CM

## 2017-12-07 LAB — INFLUENZA PANEL BY PCR (TYPE A & B)
INFLBPCR: NEGATIVE
Influenza A By PCR: NEGATIVE

## 2017-12-07 LAB — URINALYSIS, ROUTINE W REFLEX MICROSCOPIC
BILIRUBIN URINE: NEGATIVE
GLUCOSE, UA: NEGATIVE mg/dL
KETONES UR: NEGATIVE mg/dL
NITRITE: NEGATIVE
PH: 7 (ref 5.0–8.0)
Protein, ur: NEGATIVE mg/dL
Specific Gravity, Urine: 1.004 — ABNORMAL LOW (ref 1.005–1.030)
Squamous Epithelial / LPF: NONE SEEN

## 2017-12-07 LAB — CBC WITH DIFFERENTIAL/PLATELET
BASOS ABS: 0 10*3/uL (ref 0.0–0.1)
Basophils Relative: 0 %
EOS ABS: 0 10*3/uL (ref 0.0–0.7)
EOS PCT: 0 %
HEMATOCRIT: 40.9 % (ref 39.0–52.0)
Hemoglobin: 13.6 g/dL (ref 13.0–17.0)
LYMPHS ABS: 2 10*3/uL (ref 0.7–4.0)
Lymphocytes Relative: 8 %
MCH: 30.6 pg (ref 26.0–34.0)
MCHC: 33.3 g/dL (ref 30.0–36.0)
MCV: 92.1 fL (ref 78.0–100.0)
MONO ABS: 2.8 10*3/uL — AB (ref 0.1–1.0)
Monocytes Relative: 11 %
NEUTROS PCT: 81 %
Neutro Abs: 20.6 10*3/uL — ABNORMAL HIGH (ref 1.7–7.7)
PLATELETS: 283 10*3/uL (ref 150–400)
RBC: 4.44 MIL/uL (ref 4.22–5.81)
RDW: 14.8 % (ref 11.5–15.5)
WBC: 25.4 10*3/uL — AB (ref 4.0–10.5)

## 2017-12-07 LAB — COMPREHENSIVE METABOLIC PANEL
ALT: 23 U/L (ref 17–63)
AST: 20 U/L (ref 15–41)
Albumin: 2.6 g/dL — ABNORMAL LOW (ref 3.5–5.0)
Alkaline Phosphatase: 64 U/L (ref 38–126)
Anion gap: 11 (ref 5–15)
BUN: 23 mg/dL — ABNORMAL HIGH (ref 6–20)
CO2: 27 mmol/L (ref 22–32)
Calcium: 8.4 mg/dL — ABNORMAL LOW (ref 8.9–10.3)
Chloride: 102 mmol/L (ref 101–111)
Creatinine, Ser: 1.55 mg/dL — ABNORMAL HIGH (ref 0.61–1.24)
GFR calc Af Amer: 48 mL/min — ABNORMAL LOW (ref >60)
GFR calc non Af Amer: 42 mL/min — ABNORMAL LOW (ref >60)
Glucose, Bld: 141 mg/dL — ABNORMAL HIGH (ref 65–99)
Potassium: 4.1 mmol/L (ref 3.5–5.1)
Sodium: 140 mmol/L (ref 135–145)
Total Bilirubin: 1.1 mg/dL (ref 0.3–1.2)
Total Protein: 5.6 g/dL — ABNORMAL LOW (ref 6.5–8.1)

## 2017-12-07 LAB — I-STAT CG4 LACTIC ACID, ED: LACTIC ACID, VENOUS: 1.86 mmol/L (ref 0.5–1.9)

## 2017-12-07 MED ORDER — SODIUM CHLORIDE 0.9 % IV SOLN: 1.0000 g | Freq: Three times a day (TID) | INTRAVENOUS | Status: DC

## 2017-12-07 MED ORDER — ACETAMINOPHEN 325 MG PO TABS
650.0000 mg | ORAL_TABLET | Freq: Once | ORAL | Status: AC
  Administered 2017-12-07: 650 mg via ORAL

## 2017-12-07 MED ORDER — PIPERACILLIN-TAZOBACTAM 3.375 G IVPB 30 MIN
3.3750 g | Freq: Once | INTRAVENOUS | Status: AC
  Administered 2017-12-07: 3.375 g via INTRAVENOUS
  Filled 2017-12-07: qty 50

## 2017-12-07 MED ORDER — VANCOMYCIN HCL IN DEXTROSE 1-5 GM/200ML-% IV SOLN
1000.0000 mg | Freq: Once | INTRAVENOUS | Status: AC
  Administered 2017-12-07: 1000 mg via INTRAVENOUS
  Filled 2017-12-07: qty 200

## 2017-12-07 MED ORDER — TIOTROPIUM BROMIDE MONOHYDRATE 18 MCG IN CAPS
18.0000 ug | ORAL_CAPSULE | Freq: Every morning | RESPIRATORY_TRACT | Status: DC
  Administered 2017-12-08: 18 ug via RESPIRATORY_TRACT
  Filled 2017-12-07: qty 5

## 2017-12-07 MED ORDER — GUAIFENESIN ER 600 MG PO TB12
600.0000 mg | ORAL_TABLET | Freq: Two times a day (BID) | ORAL | Status: DC
  Administered 2017-12-07 – 2017-12-10 (×6): 600 mg via ORAL
  Filled 2017-12-07 (×6): qty 1

## 2017-12-07 MED ORDER — FLUTICASONE FUROATE-VILANTEROL 200-25 MCG/INH IN AEPB
1.0000 | INHALATION_SPRAY | Freq: Every day | RESPIRATORY_TRACT | Status: DC
  Administered 2017-12-08 – 2017-12-10 (×3): 1 via RESPIRATORY_TRACT
  Filled 2017-12-07: qty 28

## 2017-12-07 MED ORDER — HYDROCODONE-ACETAMINOPHEN 5-325 MG PO TABS: 1.0000 | ORAL_TABLET | ORAL | Status: DC | PRN

## 2017-12-07 MED ORDER — ENOXAPARIN SODIUM 40 MG/0.4ML ~~LOC~~ SOLN
40.0000 mg | SUBCUTANEOUS | Status: DC
  Administered 2017-12-07 – 2017-12-09 (×3): 40 mg via SUBCUTANEOUS
  Filled 2017-12-07 (×3): qty 0.4

## 2017-12-07 MED ORDER — METHYLPREDNISOLONE SODIUM SUCC 125 MG IJ SOLR
60.0000 mg | Freq: Four times a day (QID) | INTRAMUSCULAR | Status: DC
Start: 2017-12-07 — End: 2017-12-08
  Administered 2017-12-07 – 2017-12-08 (×3): 60 mg via INTRAVENOUS
  Filled 2017-12-07 (×3): qty 2

## 2017-12-07 MED ORDER — ZOLPIDEM TARTRATE 5 MG PO TABS
5.0000 mg | ORAL_TABLET | Freq: Every evening | ORAL | Status: DC | PRN
Start: 2017-12-07 — End: 2017-12-10
  Administered 2017-12-08: 5 mg via ORAL
  Filled 2017-12-07: qty 1

## 2017-12-07 MED ORDER — VANCOMYCIN HCL IN DEXTROSE 1-5 GM/200ML-% IV SOLN
1000.0000 mg | INTRAVENOUS | Status: DC
  Administered 2017-12-07: 1000 mg via INTRAVENOUS
  Filled 2017-12-07: qty 200

## 2017-12-07 MED ORDER — PREGABALIN 75 MG PO CAPS
150.0000 mg | ORAL_CAPSULE | Freq: Three times a day (TID) | ORAL | Status: DC
  Administered 2017-12-07 – 2017-12-10 (×9): 150 mg via ORAL
  Filled 2017-12-07: qty 2
  Filled 2017-12-07: qty 3
  Filled 2017-12-07 (×6): qty 2
  Filled 2017-12-07: qty 3

## 2017-12-07 MED ORDER — ALBUTEROL SULFATE (2.5 MG/3ML) 0.083% IN NEBU
2.5000 mg | INHALATION_SOLUTION | Freq: Four times a day (QID) | RESPIRATORY_TRACT | Status: DC
  Filled 2017-12-07: qty 3

## 2017-12-07 MED ORDER — IPRATROPIUM BROMIDE 0.02 % IN SOLN
0.5000 mg | Freq: Four times a day (QID) | RESPIRATORY_TRACT | Status: DC | PRN
  Administered 2017-12-07: 0.5 mg via RESPIRATORY_TRACT
  Filled 2017-12-07: qty 2.5

## 2017-12-07 MED ORDER — ONDANSETRON HCL 4 MG/2ML IJ SOLN: 4.0000 mg | Freq: Four times a day (QID) | INTRAMUSCULAR | Status: DC | PRN

## 2017-12-07 MED ORDER — METOPROLOL TARTRATE 5 MG/5ML IV SOLN
2.5000 mg | INTRAVENOUS | Status: DC | PRN
  Administered 2017-12-07: 2.5 mg via INTRAVENOUS
  Filled 2017-12-07: qty 5

## 2017-12-07 MED ORDER — SODIUM CHLORIDE 0.9 % IV BOLUS
2000.0000 mL | Freq: Once | INTRAVENOUS | Status: AC
  Administered 2017-12-07: 2000 mL via INTRAVENOUS

## 2017-12-07 MED ORDER — SODIUM CHLORIDE 0.9 % IV SOLN
2.0000 g | Freq: Two times a day (BID) | INTRAVENOUS | Status: DC
  Administered 2017-12-07 – 2017-12-08 (×2): 2 g via INTRAVENOUS
  Filled 2017-12-07 (×2): qty 2

## 2017-12-07 MED ORDER — ACETAMINOPHEN 325 MG PO TABS
650.0000 mg | ORAL_TABLET | Freq: Four times a day (QID) | ORAL | Status: DC | PRN
  Filled 2017-12-07: qty 2

## 2017-12-07 MED ORDER — ALBUTEROL SULFATE (2.5 MG/3ML) 0.083% IN NEBU: 2.5000 mg | INHALATION_SOLUTION | RESPIRATORY_TRACT | Status: DC | PRN

## 2017-12-07 MED ORDER — TAMSULOSIN HCL 0.4 MG PO CAPS
0.4000 mg | ORAL_CAPSULE | Freq: Every day | ORAL | Status: DC
  Administered 2017-12-07 – 2017-12-08 (×2): 0.4 mg via ORAL
  Filled 2017-12-07 (×4): qty 1

## 2017-12-07 MED ORDER — DILTIAZEM HCL ER COATED BEADS 240 MG PO CP24
240.0000 mg | ORAL_CAPSULE | Freq: Every day | ORAL | Status: DC
  Administered 2017-12-08 – 2017-12-10 (×3): 240 mg via ORAL
  Filled 2017-12-07: qty 2
  Filled 2017-12-07 (×3): qty 1

## 2017-12-07 MED ORDER — ONDANSETRON HCL 4 MG PO TABS: 4.0000 mg | ORAL_TABLET | Freq: Four times a day (QID) | ORAL | Status: DC | PRN

## 2017-12-07 MED ORDER — OXYCODONE HCL 5 MG PO TABS: 10.0000 mg | ORAL_TABLET | Freq: Four times a day (QID) | ORAL | Status: DC | PRN

## 2017-12-07 MED ORDER — SODIUM CHLORIDE 0.9 % IV SOLN
INTRAVENOUS | Status: DC
  Administered 2017-12-07: 22:00:00 via INTRAVENOUS

## 2017-12-07 MED ORDER — ACETAMINOPHEN 650 MG RE SUPP: 650.0000 mg | Freq: Four times a day (QID) | RECTAL | Status: DC | PRN

## 2017-12-07 NOTE — ED Provider Notes (Addendum)
[facility] DEPT Provider Note   CSN: [id number] Arrival date & time: 12/07/17  1648     History   Chief Complaint Chief Complaint  Patient presents with  . Fever  . Hypotension    HPI [name] [name] is a 77 y.o. male.  Patient is a 77 year old male with a history of severe COPD, AAA, prostate cancer who presents with fever and shortness of breath.  He was at his pulmonologist office this morning and his wife reported that he has had increased weakness over the last 2 days.  He was noted to have a fever of 103 this morning but received Tylenol and his temperature had improved by the time he presented to the pulmonologist office.  He was noted to be hypotensive with a blood pressure of 90/60 in the office as well as hypoxic with oxygen saturations around 85% on room air.  He he is not on home oxygen.  He was recently admitted for a COPD exacerbation.  Patient states that he has a little bit of confusion today and he cannot really remember all the circumstances of the last day or 2.  He denies any vomiting.  He denies any pain anywhere.  No abdominal pain.  He denies any rashes or sores.     Past Medical History:  Diagnosis Date  . AAA (abdominal aortic aneurysm) ([place])   . COPD with emphysema ([place])    Dr. Joya Gaskins, Gold stage C  . DJD (degenerative joint disease), lumbar   . Emphysema of lung ([place])   . History of prostate cancer    Status post radioactive seed implants  . History of shingles   . Incomplete emptying of bladder   . Mixed hyperlipidemia   . PAF (paroxysmal atrial fibrillation) ([place])   . Palpitations   . Peripheral neuropathy   . Sciatic nerve pain    Secondary to shingles 2011  . Urethral stricture   . Wears partial dentures    Upper and lower    Patient Active Problem List   Diagnosis Date Noted  . Protein-calorie malnutrition, severe 11/27/2017  . Orthostatic hypotension 11/25/2017  . COPD with acute exacerbation ([place])  11/25/2017  . Leukocytosis 11/25/2017  . Abdominal aortic aneurysm (AAA) without rupture ([place]) 10/27/2017  . Multiple lung nodules on CT 05/14/2016  . COPD, severe ([place]) 02/19/2016  . Emphysema of lung ([place]) 02/19/2016  . Personal history of prostate cancer   . Mixed hyperlipidemia   . DJD (degenerative joint disease), lumbar   . Neuropathy   . Back pain   . 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 ATRIAL FIBRILLATION, PAROXYSMAL 08/31/2007    Past Surgical History:  Procedure Laterality Date  . CATARACT EXTRACTION W/ INTRAOCULAR LENS  IMPLANT, BILATERAL  2012  . COLONOSCOPY  02-14-2003  . CYSTO/  BALLOON DILATION AND INCISION BLADDER NECK CONTRACTURE  X5  LAST ONE 01-24-2009  . CYSTOSCOPY WITH URETHRAL DILATATION  09/15/2011   Procedure: CYSTOSCOPY WITH URETHRAL DILATATION;  Surgeon: Bernestine Amass, MD;  Location: [facility];  Service: Urology;  Laterality: N/A;  CYSTOSCOPY, BALLOON DILATION   . CYSTOSCOPY WITH URETHRAL DILATATION N/A 04/03/2014   Procedure: CYSTOSCOPY WITH dilatation;  Surgeon: Bernestine Amass, MD;  Location: [facility];  Service: Urology;  Laterality: N/A;  . EYE SURGERY    . RADIOACTIVE PROSTATE SEED IMPLANTS/  CYSTO WITH BALLOON DILATION BLADDER NECK CONTRACTURE  11-26-2006        Home Medications  Prior to Admission medications   Medication Sig Start Date End Date Taking? Authorizing Provider  budesonide-formoterol (SYMBICORT) 160-4.5 MCG/ACT inhaler Inhale 2 puffs into the lungs 2 (two) times daily. 11/17/17  Yes Parrett, Tammy S, NP  diltiazem (DILT-XR) 120 MG 24 hr capsule TAKE 2 CAPSULES BY MOUTH ONCE DAILY IN THE MORNING AND 1 CAPSULE ONCE DAILY IN THE EVENING Patient taking differently: Take 120 mg by mouth See admin instructions. TAKE 2 CAPSULES BY MOUTH ONCE DAILY IN THE MORNING AND 1 CAPSULE ONCE DAILY IN THE EVENING 11/06/17  Yes Satira Sark, MD  ipratropium (ATROVENT) 0.02 % nebulizer solution Take 2.5 mLs (0.5  mg total) by nebulization every 6 (six) hours as needed for wheezing or shortness of breath. 11/30/17  Yes Collene Gobble, MD  LYRICA 150 MG capsule TAKE ONE CAPSULE BY MOUTH THREE TIMES DAILY 05/23/14  Yes Marcial Pacas, MD  Oxycodone HCl 10 MG TABS Take 10 mg by mouth every 6 (six) hours as needed (pain).    Yes [provider]  predniSONE (DELTASONE) 10 MG tablet Take one a day for 2 weeks then go to 1/2 tab a day until you return 12/04/17  Yes [name], MD  SPIRIVA RESPIMAT 2.5 MCG/ACT AERS INHALE TWO PUFFS BY MOUTH ONCE DAILY 12/19/16  Yes Javier Glazier, MD  tamsulosin (FLOMAX) 0.4 MG CAPS capsule Take 1 capsule (0.4 mg total) by mouth daily. 11/29/17 12/29/17 Yes Shah, Pratik D, DO  Misc. Devices [facility]) MISC Use as directed 10/21/16   Javier Glazier, MD  Spacer/Aero-Holding Chambers (AEROCHAMBER MV) inhaler Use as instructed 10/21/16   Javier Glazier, MD    Family History Family History  Problem Relation Age of Onset  . COPD Mother   . Heart disease Brother        died at 3    Social History Social History   Tobacco Use  . Smoking status: Former Smoker    Packs/day: 2.00    Years: 40.00    Pack years: 80.00    Types: Cigarettes    Start date: 06/19/1954    Last attempt to quit: 09/15/1998    Years since quitting: 19.2  . Smokeless tobacco: Never Used  . Tobacco comment: Counseled to remain smoke free  Substance Use Topics  . Alcohol use: No    Alcohol/week: 0.0 oz  . Drug use: No     Allergies   Albuterol   Review of Systems Review of Systems  Constitutional: Positive for chills and fatigue. Negative for diaphoresis and fever.  HENT: Negative for congestion, rhinorrhea and sneezing.   Eyes: Negative.   Respiratory: Positive for cough and shortness of breath. Negative for chest tightness.   Cardiovascular: Negative for chest pain and leg swelling.  Gastrointestinal: Negative for abdominal pain, blood in stool, diarrhea, nausea and vomiting.    Genitourinary: Negative for difficulty urinating, flank pain, frequency and hematuria.  Musculoskeletal: Negative for arthralgias and back pain.  Skin: Negative for rash.  Neurological: Positive for weakness (Generalized). Negative for dizziness, speech difficulty, numbness and headaches.  Psychiatric/Behavioral: Positive for confusion.     Physical Exam Updated Vital Signs BP (!) 101/46   Pulse 75   Temp 98 F (36.7 C) (Oral)   Resp 18   Ht 5\' 10"  (1.778 m)   Wt 74.8 kg (165 lb)   SpO2 94%   BMI 23.68 kg/m   Physical Exam  Constitutional: He is oriented to person, place, and time. He appears well-developed  and well-nourished.  HENT:  Head: Normocephalic and atraumatic.  Eyes: Pupils are equal, round, and reactive to light.  Neck: Normal range of motion. Neck supple.  Cardiovascular: Normal rate, regular rhythm and normal heart sounds.  Patient is hypotensive with blood pressure of 88/49  Pulmonary/Chest: Effort normal and breath sounds normal. No respiratory distress. He has no wheezes. He has no rales. He exhibits no tenderness.  Diminished breath sounds bilaterally with few crackles in the bases  Abdominal: Soft. Bowel sounds are normal. There is no tenderness. There is no rebound and no guarding.  Musculoskeletal: Normal range of motion. He exhibits no edema.  Lymphadenopathy:    He has no cervical adenopathy.  Neurological: He is alert and oriented to person, place, and time.  Skin: Skin is warm and dry. No rash noted.  Psychiatric: He has a normal mood and affect.     ED Treatments / Results  Labs (all labs ordered are listed, but only abnormal results are displayed) Labs Reviewed  COMPREHENSIVE METABOLIC PANEL - Abnormal; Notable for the following components:      Result Value   Glucose, Bld 141 (*)    BUN 23 (*)    Creatinine, Ser 1.55 (*)    Calcium 8.4 (*)    Total Protein 5.6 (*)    Albumin 2.6 (*)    GFR calc non Af Amer 42 (*)    GFR calc Af Amer  48 (*)    All other components within normal limits  CBC WITH DIFFERENTIAL/PLATELET - Abnormal; Notable for the following components:   WBC 25.4 (*)    Neutro Abs 20.6 (*)    Monocytes Absolute 2.8 (*)    All other components within normal limits  CULTURE, BLOOD (ROUTINE X 2)  CULTURE, BLOOD (ROUTINE X 2)  URINALYSIS, ROUTINE W REFLEX MICROSCOPIC  INFLUENZA PANEL BY PCR (TYPE A & B)  I-STAT CG4 LACTIC ACID, ED  I-STAT CG4 LACTIC ACID, ED    EKG EKG Interpretation  Date/Time:  Monday December 07 2017 17:14:27 EDT Ventricular Rate:  84 PR Interval:    QRS Duration: 90 QT Interval:  347 QTC Calculation: 411 R Axis:   61 Text Interpretation:  Sinus rhythm Probable left atrial enlargement No significant change since last tracing Confirmed by Duffy Bruce [phone]) on 12/07/2017 5:16:20 PM   Radiology Dg Chest 2 View  Result Date: 12/07/2017 CLINICAL DATA:  Initial evaluation for acute fever, shortness of breath. EXAM: CHEST - 2 VIEW COMPARISON:  Prior CT from 11/26/2017. FINDINGS: Cardiac and mediastinal silhouettes are stable in size and contour, and remain within normal limits. Aortic atherosclerosis. Lungs are hyperinflated with changes related COPD. Calcified granuloma projects over the left hemidiaphragm, stable. Patchy and linear right basilar opacity, which may reflect atelectasis and/or infiltrate. No pulmonary edema or pleural effusion. No pneumothorax. No acute osseus abnormality. IMPRESSION: 1. Patchy and linear right basilar opacity, which may reflect atelectasis and/or infiltrate. 2. Underlying COPD. 3. Aortic atherosclerosis. Electronically Signed   By: Jeannine Boga M.D.   On: 12/07/2017 18:13    Procedures Procedures (including critical care time)  Medications Ordered in ED Medications  vancomycin (VANCOCIN) IVPB 1000 mg/200 mL premix (1,000 mg Intravenous New Bag/Given 12/07/17 1834)  sodium chloride 0.9 % bolus 2,000 mL (2,000 mLs Intravenous Given 12/07/17  1741)  piperacillin-tazobactam (ZOSYN) IVPB 3.375 g (0 g Intravenous Stopped 12/07/17 1817)     Initial Impression / Assessment and Plan / ED Course  I have reviewed the triage vital signs  and the nursing notes.  Pertinent labs & imaging results that were available during my care of the patient were reviewed by me and considered in my medical decision making (see chart for details).     Patient is a 77 year old male who presents with fever, shortness of breath, hypoxia, hypotension and confusion.  Chest x-ray shows evidence of a right basilar infiltrate.  His white count is markedly elevated.  He has evidence of acute kidney injury.  His lactate is normal but his presentation is consistent with sepsis.  He was started on antibiotics and given IV fluid boluses (30cc/kg, 500cc per EMS and 2L here).  His hypotension has improved with his last blood pressure being 101/46.  He is requiring oxygen by nasal cannula.  He does not currently report any significant shortness of breath or need for nebulizer treatment.  I will consult hospitalist for admission.  I spoke with Dr. Laren Everts who will admit the pt.  CRITICAL CARE Performed by: Malvin Johns Total critical care time: 45 minutes Critical care time was exclusive of separately billable procedures and treating other patients. Critical care was necessary to treat or prevent imminent or life-threatening deterioration. Critical care was time spent personally by me on the following activities: development of treatment plan with patient and/or surrogate as well as nursing, discussions with consultants, evaluation of patient's response to treatment, examination of patient, obtaining history from patient or surrogate, ordering and performing treatments and interventions, ordering and review of laboratory studies, ordering and review of radiographic studies, pulse oximetry and re-evaluation of patient's condition.   Final Clinical Impressions(s) / ED  Diagnoses   Final diagnoses:  HCAP (healthcare-associated pneumonia)  Sepsis, due to unspecified organism ([place])  Hypotension, unspecified hypotension type    ED Discharge Orders    None       Malvin Johns, MD 12/07/17 Doran Heater    Malvin Johns, MD 12/07/17 2135

## 2017-12-07 NOTE — ED Notes (Signed)
Vitals documented at 1836 post 1L fluid

## 2017-12-07 NOTE — Progress Notes (Signed)
[name] [name]    [id number]    [birth year]  Primary Care Physician:Nelson, Jennette Banker, MD  Referring Physician: [name], MD [phone], [place] [address]  Chief complaint: Follow-up after hospitalization  HPI: 77 year old with severe COPD,  AAA, prostate cancer pulmonary nodules He said several recent exacerbations.  Hospitalized on 3/13-3/16 with COPD exacerbation, hypertension which improved with IV fluids, nebulizers.  Returns to clinic for a follow-up visit after hospitalization.  Reports that he has been feeling lousy at home with weakness, unable to eat or drink.  He had a temperature of 103.9 today morning and was lethargic, dificult to arouse.  Had episode of prolonged rigors.  Given Tylenol by his wife with improvement in temperature.  Also noted to be hypoxic to 85% and placed on supplemental oxygen. In clinic he appears lethargic, hypotensive with bilateral crackles  Outpatient Encounter Medications as of 12/07/2017  Medication Sig  . budesonide-formoterol (SYMBICORT) 160-4.5 MCG/ACT inhaler Inhale 2 puffs into the lungs 2 (two) times daily.  Marland Kitchen diltiazem (DILT-XR) 120 MG 24 hr capsule TAKE 2 CAPSULES BY MOUTH ONCE DAILY IN THE MORNING AND 1 CAPSULE ONCE DAILY IN THE EVENING (Patient taking differently: Take 120 mg by mouth See admin instructions. TAKE 2 CAPSULES BY MOUTH ONCE DAILY IN THE MORNING AND 1 CAPSULE ONCE DAILY IN THE EVENING)  . ipratropium (ATROVENT) 0.02 % nebulizer solution Take 2.5 mLs (0.5 mg total) by nebulization every 6 (six) hours as needed for wheezing or shortness of breath.  Marland Kitchen LYRICA 150 MG capsule TAKE ONE CAPSULE BY MOUTH THREE TIMES DAILY  . Misc. Devices (ACAPELLA) MISC Use as directed  . Oxycodone HCl 10 MG TABS Take 1 mg by mouth every 6 (six) hours as needed (pain).   . predniSONE (DELTASONE) 10 MG tablet Take one a day for 2 weeks then go to 1/2 tab a day until you return  . Spacer/Aero-Holding Chambers  (AEROCHAMBER MV) inhaler Use as instructed  . SPIRIVA RESPIMAT 2.5 MCG/ACT AERS INHALE TWO PUFFS BY MOUTH ONCE DAILY  . tamsulosin (FLOMAX) 0.4 MG CAPS capsule Take 1 capsule (0.4 mg total) by mouth daily.   No facility-administered encounter medications on file as of 12/07/2017.     Allergies as of 12/07/2017 - Review Complete 12/07/2017  Allergen Reaction Noted  . Albuterol Other (See Comments) 08/12/2017    Past Medical History:  Diagnosis Date  . AAA (abdominal aortic aneurysm) ([place])   . COPD with emphysema ([place])    Dr. Joya Gaskins, Gold stage C  . DJD (degenerative joint disease), lumbar   . Emphysema of lung ([place])   . History of prostate cancer    Status post radioactive seed implants  . History of shingles   . Incomplete emptying of bladder   . Mixed hyperlipidemia   . PAF (paroxysmal atrial fibrillation) ([place])   . Palpitations   . Peripheral neuropathy   . Sciatic nerve pain    Secondary to shingles 2011  . Urethral stricture   . Wears partial dentures    Upper and lower    Past Surgical History:  Procedure Laterality Date  . CATARACT EXTRACTION W/ INTRAOCULAR LENS  IMPLANT, BILATERAL  2012  . COLONOSCOPY  02-14-2003  . CYSTO/  BALLOON DILATION AND INCISION BLADDER NECK CONTRACTURE  X5  LAST ONE 01-24-2009  . CYSTOSCOPY WITH URETHRAL DILATATION  09/15/2011   Procedure: CYSTOSCOPY WITH URETHRAL DILATATION;  Surgeon: Bernestine Amass, MD;  Location: [facility];  Service: Urology;  Laterality: N/A;  CYSTOSCOPY, BALLOON DILATION   . CYSTOSCOPY WITH URETHRAL DILATATION N/A 04/03/2014   Procedure: CYSTOSCOPY WITH dilatation;  Surgeon: Bernestine Amass, MD;  Location: [facility];  Service: Urology;  Laterality: N/A;  . EYE SURGERY    . RADIOACTIVE PROSTATE SEED IMPLANTS/  CYSTO WITH BALLOON DILATION BLADDER NECK CONTRACTURE  11-26-2006    Family History  Problem Relation Age of Onset  . COPD Mother   . Heart disease Brother        died at 39      Social History   Socioeconomic History  . Marital status: Married    Spouse name: [name] [name]  . Number of children: 3  . Years of education: 9 th  . Highest education level: Not on file  Occupational History    Employer: RETIRED    Comment: Retired  Scientific laboratory technician  . Financial resource strain: Not hard at all  . Food insecurity:    Worry: Never true    Inability: Never true  . Transportation needs:    Medical: No    Non-medical: No  Tobacco Use  . Smoking status: Former Smoker    Packs/day: 2.00    Years: 40.00    Pack years: 80.00    Types: Cigarettes    Start date: 06/19/1954    Last attempt to quit: 09/15/1998    Years since quitting: 19.2  . Smokeless tobacco: Never Used  . Tobacco comment: Counseled to remain smoke free  Substance and Sexual Activity  . Alcohol use: No    Alcohol/week: 0.0 oz  . Drug use: No  . Sexual activity: Never  Lifestyle  . Physical activity:    Days per week: 0 days    Minutes per session: 0 min  . Stress: Not at all  Relationships  . Social connections:    Talks on phone: Not on file    Gets together: Not on file    Attends religious service: Not on file    Active member of club or organization: Not on file    Attends meetings of clubs or organizations: Not on file    Relationship status: Not on file  . Intimate partner violence:    Fear of current or ex partner: No    Emotionally abused: No    Physically abused: No    Forced sexual activity: Not on file  Other Topics Concern  . Not on file  Social History Narrative   Patient lives at home with [name] [name]). Patient is retired and has three daughters and one step daughter. Patient has a 9th grade education.   Right handed.   Caffeine- three  cups daily.      [facility]:   Originally from [place], [name]. Always lived in [name]. 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    Has a dog currently. No bird, mold, or asbestos exposure. 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     Review of systems: Review of Systems  Constitutional: Negative for fever and chills.  HENT: Negative.   Eyes: Negative for blurred vision.  Respiratory: as per HPI  Cardiovascular: Negative for chest pain and palpitations.  Gastrointestinal: Negative for vomiting, diarrhea, blood per rectum. Genitourinary: Negative for dysuria, urgency, frequency and hematuria.  Musculoskeletal: Negative for myalgias, back pain and joint pain.  Skin: Negative for itching and rash.  Neurological: Negative for dizziness, tremors, focal weakness, seizures and loss of consciousness.  Endo/Heme/Allergies: Negative for environmental allergies.  Psychiatric/Behavioral: Negative for depression, suicidal ideas and hallucinations.  All other systems reviewed and are negative.  Physical Exam: Blood pressure 90/60, pulse 85, temperature (!) 97 F (36.1 C), temperature source Oral, height 5\' 10"  (1.778 m), weight 152 lb (68.9 kg), SpO2 92 %. Gen:      Ashen, pallid, weak HEENT:  EOMI, sclera anicteric Neck:     No masses; no thyromegaly Lungs:    B/L crackles CV:         Regular rate and rhythm; no murmurs Abd:      + bowel sounds; soft, non-tender; no palpable masses, no distension Ext:    No edema; adequate peripheral perfusion Skin:      Warm and dry; no rash Neuro: Somnolent  Data Reviewed: PFTs 05/21/16 FVC 2.83 [72%], FEV1 1.41 [50%], F/F 50  Severe obstruction  CT angio 11/26/17-no pulmonary embolism, 8 mm pulmonary nodule enlarged from August, additional stable bilateral pulmonary nodule, emphysema, coronary artery calcification I have reviewed the images personally.  Assessment?Paln: Severe COPD Recent hospitalization for COPD exacerbation Concern for HCAP, sepsis as he has fevers, hypotension, weakness EMS called.  He will be taken to [facility] emergency room for evaluation  Marshell Garfinkel MD [facility] Pager [phone] 12/07/2017, 3:57 PM  CC: [name], MD

## 2017-12-07 NOTE — ED Triage Notes (Signed)
Pt to ed via [facility] with complaints of fever 103.1 from [place] where he was supposed to be seen tomorrow but moved him due to fever today also has complaints of SOB. Sent to ED from there due to hypotension 90/60. Pt has a Hx COPD, Afib, and ortho-hypotension. Ems gave 500cc NS.  Vitals CBG 98 HR 100 BP 92/49 91% 2L  18G LFA

## 2017-12-07 NOTE — Progress Notes (Addendum)
PHARMACY NOTE:  ANTIMICROBIAL RENAL DOSAGE ADJUSTMENT  Current antimicrobial regimen includes a mismatch between antimicrobial dosage and estimated renal function.  As per policy approved by the Pharmacy & Therapeutics and Medical Executive Committees, the antimicrobial dosage will be adjusted accordingly.  Current antimicrobial dosage:  Cefepime 1gm IV q8h  Indication: PNA  Renal Function:  Estimated Creatinine Clearance: 41.9 mL/min (A) (by C-G formula based on SCr of 1.55 mg/dL (H)). []      On intermittent HD, scheduled: []      On CRRT    Antimicrobial dosage has been changed to:  Cefepime 2gm IV q12h   Thank you for allowing pharmacy to be a part of this patient's care.  Lynelle Doctor, [facility] 12/07/2017 9:48 PM

## 2017-12-07 NOTE — H&P (Addendum)
[facility]                                                                                    Patient Demographics  [name] [name], is a 77 y.o. male  CSN: [id number]  MRN: [id number]  DOB - 1940-09-28  Admit Date - 12/07/2017  Outpatient Primary MD for the patient is [name], MD   With History of -  Past Medical History:  Diagnosis Date  . AAA (abdominal aortic aneurysm) ([place])   . COPD with emphysema ([place])    Dr. Joya Gaskins, Gold stage C  . DJD (degenerative joint disease), lumbar   . Emphysema of lung ([place])   . History of prostate cancer    Status post radioactive seed implants  . History of shingles   . Incomplete emptying of bladder   . Mixed hyperlipidemia   . PAF (paroxysmal atrial fibrillation) ([place])   . Palpitations   . Peripheral neuropathy   . Sciatic nerve pain    Secondary to shingles 2011  . Urethral stricture   . Wears partial dentures    Upper and lower      Past Surgical History:  Procedure Laterality Date  . CATARACT EXTRACTION W/ INTRAOCULAR LENS  IMPLANT, BILATERAL  2012  . COLONOSCOPY  02-14-2003  . CYSTO/  BALLOON DILATION AND INCISION BLADDER NECK CONTRACTURE  X5  LAST ONE 01-24-2009  . CYSTOSCOPY WITH URETHRAL DILATATION  09/15/2011   Procedure: CYSTOSCOPY WITH URETHRAL DILATATION;  Surgeon: Bernestine Amass, MD;  Location: [facility];  Service: Urology;  Laterality: N/A;  CYSTOSCOPY, BALLOON DILATION   . CYSTOSCOPY WITH URETHRAL DILATATION N/A 04/03/2014   Procedure: CYSTOSCOPY WITH dilatation;  Surgeon: Bernestine Amass, MD;  Location: [facility];  Service: Urology;  Laterality: N/A;  . EYE SURGERY    . RADIOACTIVE PROSTATE SEED IMPLANTS/  CYSTO WITH BALLOON DILATION BLADDER NECK CONTRACTURE  11-26-2006    in for   Chief Complaint  Patient presents with  . Fever  . Hypotension     HPI  [name] [name]  is a 77 y.o. male, with past medical history significant for COPD and history of  prostate cancer presenting today with 1 day history of acute shortness of breath with fever and chills and cough with greenish sputum .  Patient denies nausea vomiting or diarrhea.  No history of alcoholism.  Patient was at his pulmonologist office today and was sent here because of hypotension and hypoxemia.  Patient denies any chest pains.  Patient tried his rescue inhaler and nebulizer to no avail at home.    Review of Systems    In addition to the HPI above,   No Headache, No changes with Vision or hearing, No problems swallowing food or Liquids, No  Cough or Shortness of Breath, No Abdominal pain, No Nausea or Vommitting, Bowel movements are regular, No Blood in stool or Urine, No dysuria, No new skin rashes or bruises, No new joints pains-aches,  No new weakness, tingling, numbness in any extremity, No recent weight gain or loss, No polyuria, polydypsia or polyphagia, No significant Mental Stressors.  A full 10 point Review  of Systems was done, except as stated above, all other Review of Systems were negative.   Social History Social History   Tobacco Use  . Smoking status: Former Smoker    Packs/day: 2.00    Years: 40.00    Pack years: 80.00    Types: Cigarettes    Start date: 06/19/1954    Last attempt to quit: 09/15/1998    Years since quitting: 19.2  . Smokeless tobacco: Never Used  . Tobacco comment: Counseled to remain smoke free  Substance Use Topics  . Alcohol use: No    Alcohol/week: 0.0 oz     Family History Family History  Problem Relation Age of Onset  . COPD Mother   . Heart disease Brother        died at 22     Prior to Admission medications   Medication Sig Start Date End Date Taking? Authorizing Provider  budesonide-formoterol (SYMBICORT) 160-4.5 MCG/ACT inhaler Inhale 2 puffs into the lungs 2 (two) times daily. 11/17/17  Yes Parrett, Tammy S, NP  diltiazem (DILT-XR) 120 MG 24 hr capsule TAKE 2 CAPSULES BY MOUTH ONCE DAILY IN THE MORNING AND 1  CAPSULE ONCE DAILY IN THE EVENING Patient taking differently: Take 120 mg by mouth See admin instructions. TAKE 2 CAPSULES BY MOUTH ONCE DAILY IN THE MORNING AND 1 CAPSULE ONCE DAILY IN THE EVENING 11/06/17  Yes Satira Sark, MD  ipratropium (ATROVENT) 0.02 % nebulizer solution Take 2.5 mLs (0.5 mg total) by nebulization every 6 (six) hours as needed for wheezing or shortness of breath. 11/30/17  Yes Collene Gobble, MD  LYRICA 150 MG capsule TAKE ONE CAPSULE BY MOUTH THREE TIMES DAILY 05/23/14  Yes Marcial Pacas, MD  Oxycodone HCl 10 MG TABS Take 10 mg by mouth every 6 (six) hours as needed (pain).    Yes [provider]  predniSONE (DELTASONE) 10 MG tablet Take one a day for 2 weeks then go to 1/2 tab a day until you return 12/04/17  Yes [name], MD  SPIRIVA RESPIMAT 2.5 MCG/ACT AERS INHALE TWO PUFFS BY MOUTH ONCE DAILY 12/19/16  Yes Javier Glazier, MD  tamsulosin (FLOMAX) 0.4 MG CAPS capsule Take 1 capsule (0.4 mg total) by mouth daily. 11/29/17 12/29/17 Yes Shah, Pratik D, DO  Misc. Devices [facility]) MISC Use as directed 10/21/16   Javier Glazier, MD  Spacer/Aero-Holding Chambers (AEROCHAMBER MV) inhaler Use as instructed 10/21/16   Javier Glazier, MD    Allergies  Allergen Reactions  . Albuterol Other (See Comments)    Caused extreme tachycardia. If used by continuous infusion.  May use the inhaler    Physical Exam  Vitals  Blood pressure (!) 105/51, pulse 85, temperature 98 F (36.7 C), temperature source Oral, resp. rate 19, height 5\' 10"  (1.778 m), weight 74.8 kg (165 lb), SpO2 99 %.   1. General elderly male lying in bed, looks tired  2. Normal affect and insight, Not Suicidal or Homicidal, Awake Alert, Oriented X 3.  3. No F.N deficits, grossly patient moving all extremities.  4. Ears and Eyes appear Normal, Conjunctivae clear, PERRLA. Moist Oral Mucosa.  5. Supple Neck, No JVD, No cervical lymphadenopathy appriciated, No Carotid Bruits.  6.  Symmetrical Chest wall movement, mild scattered rhonchi.  Decreased breath sounds at the right base  7. RRR, No Gallops, Rubs or Murmurs, No Parasternal Heave.  8. Positive Bowel Sounds, Abdomen Soft, Non tender, No organomegaly appriciated,No rebound -guarding or rigidity.  9.  No Cyanosis, Normal Skin Turgor, No Skin Rash or Bruise.  10. Good muscle tone,  joints appear normal , no effusions, Normal ROM.    Data Review  CBC Recent Labs  Lab 12/07/17 1719  WBC 25.4*  HGB 13.6  HCT 40.9  PLT 283  MCV 92.1  MCH 30.6  MCHC 33.3  RDW 14.8  LYMPHSABS 2.0  MONOABS 2.8*  EOSABS 0.0  BASOSABS 0.0   ------------------------------------------------------------------------------------------------------------------  Chemistries  Recent Labs  Lab 12/07/17 1719  NA 140  K 4.1  CL 102  CO2 27  GLUCOSE 141*  BUN 23*  CREATININE 1.55*  CALCIUM 8.4*  AST 20  ALT 23  ALKPHOS 64  BILITOT 1.1   ------------------------------------------------------------------------------------------------------------------ estimated creatinine clearance is 41.9 mL/min (A) (by C-G formula based on SCr of 1.55 mg/dL (H)). ------------------------------------------------------------------------------------------------------------------ No results for input(s): TSH, T4TOTAL, T3FREE, THYROIDAB in the last 72 hours.  Invalid input(s): FREET3   Coagulation profile No results for input(s): INR, PROTIME in the last 168 hours. ------------------------------------------------------------------------------------------------------------------- No results for input(s): DDIMER in the last 72 hours. -------------------------------------------------------------------------------------------------------------------  Cardiac Enzymes No results for input(s): CKMB, TROPONINI, MYOGLOBIN in the last 168 hours.  Invalid input(s):  CK ------------------------------------------------------------------------------------------------------------------ Invalid input(s): POCBNP   ---------------------------------------------------------------------------------------------------------------  Urinalysis    Component Value Date/Time   COLORURINE YELLOW 12/07/2017 1719   APPEARANCEUR CLEAR 12/07/2017 1719   LABSPEC 1.004 (L) 12/07/2017 1719   PHURINE 7.0 12/07/2017 1719   GLUCOSEU NEGATIVE 12/07/2017 1719   HGBUR MODERATE (A) 12/07/2017 1719   BILIRUBINUR NEGATIVE 12/07/2017 1719   KETONESUR NEGATIVE 12/07/2017 1719   PROTEINUR NEGATIVE 12/07/2017 1719   NITRITE NEGATIVE 12/07/2017 1719   LEUKOCYTESUR LARGE (A) 12/07/2017 1719    ----------------------------------------------------------------------------------------------------------------   Imaging results:   Dg Chest 2 View  Result Date: 12/07/2017 CLINICAL DATA:  Initial evaluation for acute fever, shortness of breath. EXAM: CHEST - 2 VIEW COMPARISON:  Prior CT from 11/26/2017. FINDINGS: Cardiac and mediastinal silhouettes are stable in size and contour, and remain within normal limits. Aortic atherosclerosis. Lungs are hyperinflated with changes related COPD. Calcified granuloma projects over the left hemidiaphragm, stable. Patchy and linear right basilar opacity, which may reflect atelectasis and/or infiltrate. No pulmonary edema or pleural effusion. No pneumothorax. No acute osseus abnormality. IMPRESSION: 1. Patchy and linear right basilar opacity, which may reflect atelectasis and/or infiltrate. 2. Underlying COPD. 3. Aortic atherosclerosis. Electronically Signed   By: Jeannine Boga M.D.   On: 12/07/2017 18:13   Dg Chest 2 View  Result Date: 11/25/2017 CLINICAL DATA:  Dyspnea since Friday. EXAM: CHEST - 2 VIEW COMPARISON:  11/18/2017 FINDINGS: The heart size and mediastinal contours are within normal limits. Minimal aortic atherosclerosis at the arch  without aneurysm. Hyperinflated lungs without acute pneumonic consolidation or CHF. Small calcified granuloma at the left lung base. The visualized skeletal structures are unremarkable. IMPRESSION: Hyperinflated lungs consistent with COPD. Calcified granuloma projects over the left lung base. Electronically Signed   By: Ashley Royalty M.D.   On: 11/25/2017 20:13   Dg Chest 2 View  Result Date: 11/18/2017 CLINICAL DATA:  77 year old male with shortness of breath for the past day EXAM: CHEST - 2 VIEW COMPARISON:  Prior chest x-ray 11/03/2016 FINDINGS: Stable cardiac and mediastinal contours. Trace atherosclerotic calcifications present in the transverse aorta. The lungs remain hyperinflated. Increased retrosternal clear space on the lateral view. Stable calcified granuloma in the left lower lobe. No pneumothorax, pleural effusion or pulmonary edema. No acute osseous abnormality. IMPRESSION: 1. Pulmonary hyperinflation suggests underlying  COPD. 2. No acute cardiopulmonary process. 3.  Aortic Atherosclerosis (ICD10-170.0) Electronically Signed   By: Jacqulynn Cadet M.D.   On: 11/18/2017 15:51   Ct Angio Chest Pe W Or Wo Contrast  Result Date: 11/26/2017 CLINICAL DATA:  Acute onset of shortness of breath. Decreased appetite and chills. EXAM: CT ANGIOGRAPHY CHEST WITH CONTRAST TECHNIQUE: Multidetector CT imaging of the chest was performed using the standard protocol during bolus administration of intravenous contrast. Multiplanar CT image reconstructions and MIPs were obtained to evaluate the vascular anatomy. CONTRAST:  165mL ISOVUE-370 IOPAMIDOL (ISOVUE-370) INJECTION 76% COMPARISON:  Chest radiograph performed 11/25/2017, and CT of the chest performed 04/22/2017 FINDINGS: Cardiovascular:  There is no evidence of pulmonary embolus. The heart is normal in size. Mild calcification is seen along the thoracic aorta and proximal great vessels. Scattered coronary artery calcifications are seen. Mediastinum/Nodes:  Trace pericardial fluid remains within normal limits. Visualized mediastinal and hilar nodes are borderline normal in size. The visualized portions of the thyroid gland are unremarkable. No axillary lymphadenopathy is seen. Lungs/Pleura: An 8 mm nodule is noted at the periphery of the right upper lobe (image 64 of 144). This has increased in size from 6 mm on the prior study. Additional scattered nodules are noted within the right upper and lower lobes, and the a 5 mm pleural-based nodule is noted at the left lower lobe (image 100 of 144). Bilateral emphysema is noted. No pleural effusion or pneumothorax is seen. Upper Abdomen: The visualized portions of the liver are unremarkable. Scattered calcified granulomata are seen within the spleen. The visualized portions of the pancreas and adrenal glands are within normal limits. Musculoskeletal: No acute osseous abnormalities are identified. The visualized musculature is unremarkable in appearance. Review of the MIP images confirms the above findings. IMPRESSION: 1. No evidence of pulmonary embolus. 2. The largest pulmonary nodule measures 8 mm, with a tail extending centrally that is new from August. It is increased in size from 6 mm in August. PET/CT could be considered for further evaluation, to help exclude malignancy. 3. Additional bilateral pulmonary nodules appear relatively stable. 4. Bilateral emphysema noted 5. Scattered coronary artery calcifications. Electronically Signed   By: Garald Balding M.D.   On: 11/26/2017 02:27   US Aorta Complete  Result Date: 11/26/2017 CLINICAL DATA:  AAA (abdominal aortic aneurysm) (HCC) I71.4 (ICD-10-CM) EXAM: ULTRASOUND OF ABDOMINAL AORTA TECHNIQUE: Ultrasound examination of the abdominal aorta was performed to evaluate for abdominal aortic aneurysm. COMPARISON:  Lumbar MRI, 09/02/2013. FINDINGS: Abdominal aortic measurements as follows: Proximal:  2.6 cm Mid:  2.0 cm Distal:  2.7 cm Right common iliac: 1.5 cm Left common  iliac: 1.1 cm Distal aortic systolic flow velocity: 71 centimeters/second. IMPRESSION: 1. Distal abdominal aortic aneurysm with a anterior-posterior dimension of 2.7 cm, measured at 2.5 cm on a prior ultrasound report dated 11/06/2006. On the MRI of the lumbar spine, the distal aorta measured 2.9 cm from anterior to posterior on the sagittal view. Ectatic abdominal aorta at risk for aneurysm development. Recommend followup by ultrasound in 5 years. This recommendation follows ACR consensus guidelines: White Paper of the ACR Incidental Findings Committee II on Vascular Findings. J Am Coll Radiol 2013; 10:789-794. Electronically Signed   By: Lajean Manes M.D.   On: 11/26/2017 11:32    EKG: Normal sinus rhythm at 84 bpm with no acute changes  Assessment & Plan  1.  Pneumonia 2.  Sepsis /shock 3.  History of COPD  Plan  Admit to stepdown IV antibiotics vancomycin and  cefepime IV fluids IV steroids Nebulizer treatments Check serial troponins     DVT Prophylaxis Lovenox  AM Labs Ordered, also please review Full Orders  Family Communication: Admission, patients condition and plan of care including tests being ordered have been discussed with the patient and wife who indicate understanding and agree with the plan and Code Status.  Code Status full  Disposition Plan: Home  Time spent in minutes : 44 minutes  Condition GUARDED   @SIGNATURE @

## 2017-12-07 NOTE — Progress Notes (Addendum)
I was called by nurse to evaluate patient for increased blood pressure and pulse.  IV fluids were DC'd and patient was started on Lopressor as needed .  Patient seen and he was noted to be shaking with chills.  Lungs scattered rhonchi still present.

## 2017-12-07 NOTE — Progress Notes (Signed)
Pharmacy Antibiotic Note  [name] [name] is a 77 y.o. male admitted on 12/07/2017 with pneumonia.  Pharmacy has been consulted for Vancomycin dosing. 12/07/2017:  CXR + PNA  Afebrile since admission, but reports Tm 103 at home this morning  WBC elevated, LA WNL  Scr elevated (baseline Scr ~0.89), est CrCl ~64ml/min  Plan: Vancomycin 1gm IV q24h Cefepime per MD Monitor renal function and cx data  Consider check MRSA PCR  Height: 5\' 10"  (177.8 cm) Weight: 165 lb (74.8 kg) IBW/kg (Calculated) : 73  Temp (24hrs), Avg:97.5 F (36.4 C), Min:97 F (36.1 C), Max:98 F (36.7 C)  Recent Labs  Lab 12/07/17 1719 12/07/17 1733  WBC 25.4*  --   CREATININE 1.55*  --   LATICACIDVEN  --  1.86    Estimated Creatinine Clearance: 41.9 mL/min (A) (by C-G formula based on SCr of 1.55 mg/dL (H)).    Allergies  Allergen Reactions  . Albuterol Other (See Comments)    Caused extreme tachycardia. If used by continuous infusion.  May use the inhaler    Antimicrobials this admission: 3/25 Zosyn x1 in ED 3/25 Cefepime>> 3/25 Vanc>>   Dose adjustments this admission:  Microbiology results: 3/25 BCx:  MRSA PCR:  3/25 Influenza PCR: negative  Thank you for allowing pharmacy to be a part of this patient's care.  Biagio Borg 12/07/2017 8:05 PM

## 2017-12-07 NOTE — ED Notes (Signed)
Bed: WA07 Expected date:  Expected time:  Means of arrival:  Comments: EMS-fever 

## 2017-12-07 NOTE — Progress Notes (Signed)
A consult was received from an ED physician for [place] per pharmacy dosing.  The patient's profile has been reviewed for ht/wt/allergies/indication/available labs.   A one time order has been placed for Vancomycin 1gm & zosyn 3.375gm IV.  Further antibiotics/pharmacy consults should be ordered by admitting physician if indicated.                       Thank you, Biagio Borg 12/07/2017  5:20 PM

## 2017-12-08 ENCOUNTER — Inpatient Hospital Stay: Payer: Medicare Other | Admitting: Adult Health

## 2017-12-08 ENCOUNTER — Other Ambulatory Visit: Payer: Self-pay

## 2017-12-08 LAB — BASIC METABOLIC PANEL
Anion gap: 6 (ref 5–15)
BUN: 21 mg/dL — AB (ref 6–20)
CALCIUM: 8 mg/dL — AB (ref 8.9–10.3)
CO2: 26 mmol/L (ref 22–32)
CREATININE: 1.09 mg/dL (ref 0.61–1.24)
Chloride: 108 mmol/L (ref 101–111)
GFR calc Af Amer: >60 mL/min (ref >60)
Glucose, Bld: 190 mg/dL — ABNORMAL HIGH (ref 65–99)
Potassium: 4.1 mmol/L (ref 3.5–5.1)
Sodium: 140 mmol/L (ref 135–145)

## 2017-12-08 LAB — TROPONIN I

## 2017-12-08 LAB — CBC
HCT: 39.3 % (ref 39.0–52.0)
Hemoglobin: 13 g/dL (ref 13.0–17.0)
MCH: 30.8 pg (ref 26.0–34.0)
MCHC: 33.1 g/dL (ref 30.0–36.0)
MCV: 93.1 fL (ref 78.0–100.0)
Platelets: 218 10*3/uL (ref 150–400)
RBC: 4.22 MIL/uL (ref 4.22–5.81)
RDW: 15 % (ref 11.5–15.5)
WBC: 15.7 10*3/uL — ABNORMAL HIGH (ref 4.0–10.5)

## 2017-12-08 LAB — EXPECTORATED SPUTUM ASSESSMENT W REFEX TO RESP CULTURE

## 2017-12-08 LAB — EXPECTORATED SPUTUM ASSESSMENT W GRAM STAIN, RFLX TO RESP C

## 2017-12-08 LAB — STREP PNEUMONIAE URINARY ANTIGEN: Strep Pneumo Urinary Antigen: NEGATIVE

## 2017-12-08 LAB — MRSA PCR SCREENING: MRSA by PCR: NEGATIVE

## 2017-12-08 MED ORDER — PREDNISONE 20 MG PO TABS
40.0000 mg | ORAL_TABLET | Freq: Every day | ORAL | Status: DC
  Administered 2017-12-09 – 2017-12-10 (×2): 40 mg via ORAL
  Filled 2017-12-08 (×2): qty 2

## 2017-12-08 MED ORDER — DILTIAZEM HCL ER COATED BEADS 120 MG PO CP24
120.0000 mg | ORAL_CAPSULE | Freq: Every day | ORAL | Status: DC
  Administered 2017-12-08 – 2017-12-09 (×2): 120 mg via ORAL
  Filled 2017-12-08 (×2): qty 1

## 2017-12-08 MED ORDER — SODIUM CHLORIDE 0.9 % IV SOLN
2.0000 g | Freq: Three times a day (TID) | INTRAVENOUS | Status: DC
  Administered 2017-12-08 – 2017-12-10 (×6): 2 g via INTRAVENOUS
  Filled 2017-12-08 (×8): qty 2

## 2017-12-08 MED ORDER — VANCOMYCIN HCL 10 G IV SOLR
1250.0000 mg | INTRAVENOUS | Status: DC
  Administered 2017-12-08: 1250 mg via INTRAVENOUS
  Filled 2017-12-08 (×2): qty 1250

## 2017-12-08 NOTE — ED Notes (Signed)
Delay in patient transport due to critical patients in this RN's section

## 2017-12-08 NOTE — ED Notes (Signed)
ED TO INPATIENT HANDOFF REPORT  Name/Age/Gender [name] [name] 77 y.o. male  Code Status    Code Status Orders  (From admission, onward)        Start     Ordered   12/07/17 2128  Full code  Continuous     12/07/17 2128    Code Status History    Date Active Date Inactive Code Status Order ID Comments User Context   11/25/2017 2306 11/28/2017 1644 Full Code [id number]  Reubin Milan, MD ED    Advance Directive Documentation     Most Recent Value  Type of Advance Directive  Healthcare Power of Attorney  Pre-existing out of facility DNR order (yellow form or pink MOST form)  -  "MOST" Form in Place?  -      Home/SNF/Other Home  Chief Complaint Fever; Hypotension  Level of Care/Admitting Diagnosis ED Disposition    ED Disposition Condition Comment   Admit  Hospital Area: [place] [[id number]]  Level of Care: Stepdown [14]  Admit to SDU based on following criteria: Hemodynamic compromise or significant risk of instability:  Patient requiring short term acute titration and management of vasoactive drips, and invasive monitoring (i.e., CVP and Arterial line).  Diagnosis: Sepsis [facility]) [[id number]]  Admitting Physician: Merton Border [[id number]]  Attending Physician: Laren Everts, [place]  Estimated length of stay: past midnight tomorrow  Certification:: I certify this patient will need inpatient services for at least 2 midnights  PT Class (Do Not Modify): Inpatient [101]  PT Acc Code (Do Not Modify): Private [1]       Medical History Past Medical History:  Diagnosis Date  . AAA (abdominal aortic aneurysm) ([place])   . COPD with emphysema ([place])    Dr. Joya Gaskins, [place] stage C  . DJD (degenerative joint disease), lumbar   . Emphysema of lung ([place])   . History of prostate cancer    Status post radioactive seed implants  . History of shingles   . Incomplete emptying of bladder   . Mixed hyperlipidemia   . PAF (paroxysmal atrial fibrillation) ([place])   .  Palpitations   . Peripheral neuropathy   . Sciatic nerve pain    Secondary to shingles 2011  . Urethral stricture   . Wears partial dentures    Upper and lower    Allergies Allergies  Allergen Reactions  . Albuterol Other (See Comments)    Caused extreme tachycardia. If used by continuous infusion.  May use the inhaler    IV Location/Drains/Wounds Patient Lines/Drains/Airways Status   Active Line/Drains/Airways    Name:   Placement date:   Placement time:   Site:   Days:   Peripheral IV 12/07/17 Left Antecubital   12/07/17    1730    Antecubital   1   Peripheral IV 12/07/17 Right Forearm   12/07/17    1814    Forearm   1          Labs/Imaging Results for orders placed or performed during the hospital encounter of 12/07/17 (from the past 48 hour(s))  Comprehensive metabolic panel     Status: Abnormal   Collection Time: 12/07/17  5:19 PM  Result Value Ref Range   Sodium 140 135 - 145 mmol/L   Potassium 4.1 3.5 - 5.1 mmol/L   Chloride 102 101 - 111 mmol/L   CO2 27 22 - 32 mmol/L   Glucose, Bld 141 (H) 65 - 99 mg/dL   BUN 23 (H) 6 - 20  mg/dL   Creatinine, Ser 1.55 (H) 0.61 - 1.24 mg/dL   Calcium 8.4 (L) 8.9 - 10.3 mg/dL   Total Protein 5.6 (L) 6.5 - 8.1 g/dL   Albumin 2.6 (L) 3.5 - 5.0 g/dL   AST 20 15 - 41 U/L   ALT 23 17 - 63 U/L   Alkaline Phosphatase 64 38 - 126 U/L   Total Bilirubin 1.1 0.3 - 1.2 mg/dL   GFR calc non Af Amer 42 (L) >60 mL/min   GFR calc Af Amer 48 (L) >60 mL/min    Comment: (NOTE) The eGFR has been calculated using the CKD EPI equation. This calculation has not been validated in all clinical situations. eGFR's persistently <60 mL/min signify possible Chronic Kidney Disease.    Anion gap 11 5 - 15    Comment: Performed at [facility], [place] [address], [place], [place] [address]  CBC WITH DIFFERENTIAL     Status: Abnormal   Collection Time: 12/07/17  5:19 PM  Result Value Ref Range   WBC 25.4 (H) 4.0 - 10.5 K/uL   RBC 4.44  4.22 - 5.81 MIL/uL   Hemoglobin 13.6 13.0 - 17.0 g/dL   HCT 40.9 39.0 - 52.0 %   MCV 92.1 78.0 - 100.0 fL   MCH 30.6 26.0 - 34.0 pg   MCHC 33.3 30.0 - 36.0 g/dL   RDW 14.8 11.5 - 15.5 %   Platelets 283 150 - 400 K/uL   Neutrophils Relative % 81 %   Lymphocytes Relative 8 %   Monocytes Relative 11 %   Eosinophils Relative 0 %   Basophils Relative 0 %   Neutro Abs 20.6 (H) 1.7 - 7.7 K/uL   Lymphs Abs 2.0 0.7 - 4.0 K/uL   Monocytes Absolute 2.8 (H) 0.1 - 1.0 K/uL   Eosinophils Absolute 0.0 0.0 - 0.7 K/uL   Basophils Absolute 0.0 0.0 - 0.1 K/uL   WBC Morphology WHITE COUNT CONFIRMED ON SMEAR    Smear Review MORPHOLOGY UNREMARKABLE     Comment: Performed at [facility], [place] [address] [place], [place] [address]  Urinalysis, Routine w reflex microscopic     Status: Abnormal   Collection Time: 12/07/17  5:19 PM  Result Value Ref Range   Color, Urine YELLOW YELLOW   APPearance CLEAR CLEAR   Specific Gravity, Urine 1.004 (L) 1.005 - 1.030   pH 7.0 5.0 - 8.0   Glucose, UA NEGATIVE NEGATIVE mg/dL   Hgb urine dipstick MODERATE (A) NEGATIVE   Bilirubin Urine NEGATIVE NEGATIVE   Ketones, ur NEGATIVE NEGATIVE mg/dL   Protein, ur NEGATIVE NEGATIVE mg/dL   Nitrite NEGATIVE NEGATIVE   Leukocytes, UA LARGE (A) NEGATIVE   RBC / HPF 0-5 0 - 5 RBC/hpf   WBC, UA TOO NUMEROUS TO COUNT 0 - 5 WBC/hpf   Bacteria, UA RARE (A) NONE SEEN   Squamous Epithelial / LPF NONE SEEN NONE SEEN    Comment: Performed at [facility], [place] [address], [place], [place] [address]  Blood Culture (routine x 2)     Status: None (Preliminary result)   Collection Time: 12/07/17  5:30 PM  Result Value Ref Range   Specimen Description      BLOOD LEFT ANTECUBITAL Performed at [place] [address], [place], [place] [address]    Special Requests      BOTTLES DRAWN AEROBIC AND ANAEROBIC Blood Culture results may not be optimal due to an excessive volume of  blood received in culture bottles Performed at [facility], [place] [address], [place], [place] [address]    Culture      NO GROWTH < 12 HOURS Performed at [place] [address], [place], [place] [address]    Report Status PENDING   I-Stat CG4 Lactic Acid, ED  (not at  [facility])     Status: None   Collection Time: 12/07/17  5:33 PM  Result Value Ref Range   Lactic Acid, Venous 1.86 0.5 - 1.9 mmol/L  Blood Culture (routine x 2)     Status: None (Preliminary result)   Collection Time: 12/07/17  5:41 PM  Result Value Ref Range   Specimen Description      BLOOD RIGHT FOREARM Performed at [place] [address], [place], [place] [address]    Special Requests      BOTTLES DRAWN AEROBIC AND ANAEROBIC Blood Culture results may not be optimal due to an excessive volume of blood received in culture bottles Performed at [place] [address], [place], [place] [address]    Culture      NO GROWTH < 12 HOURS Performed at [place] [address], [place], [place] [address]    Report Status PENDING   Influenza panel by PCR (type A & B)     Status: None   Collection Time: 12/07/17  5:45 PM  Result Value Ref Range   Influenza A By PCR NEGATIVE NEGATIVE   Influenza B By PCR NEGATIVE NEGATIVE    Comment: (NOTE) The Xpert Xpress Flu assay is intended as an aid in the diagnosis of  influenza and should not be used as a sole basis for treatment.  This  assay is FDA approved for nasopharyngeal swab specimens only. Nasal  washings and aspirates are unacceptable for Xpert Xpress Flu testing. Performed at [facility], [place] [address] [place], [place] [address]   Strep pneumoniae urinary antigen     Status: None   Collection Time: 12/07/17  7:03 PM  Result Value Ref Range   Strep Pneumo Urinary Antigen NEGATIVE NEGATIVE    Comment:        Infection due to S. pneumoniae cannot be absolutely  ruled out since the antigen present may be below the detection limit of the test. Performed at [facility], [address], [place], [place] [address]   Troponin I     Status: None   Collection Time: 12/08/17 12:58 AM  Result Value Ref Range   Troponin I <0.03 <0.03 ng/mL    Comment: Performed at [facility], [place] [address] [place], [place] [address]  Troponin I     Status: None   Collection Time: 12/08/17  3:29 AM  Result Value Ref Range   Troponin I <0.03 <0.03 ng/mL    Comment: Performed at [facility], [place] [address] [place], [place] [address]  Basic metabolic panel     Status: Abnormal   Collection Time: 12/08/17  6:20 AM  Result Value Ref Range   Sodium 140 135 - 145 mmol/L   Potassium 4.1 3.5 - 5.1 mmol/L   Chloride 108 101 - 111 mmol/L   CO2 26 22 - 32 mmol/L   Glucose, Bld 190 (H) 65 - 99 mg/dL   BUN 21 (H) 6 - 20 mg/dL   Creatinine, Ser 1.09 0.61 - 1.24 mg/dL   Calcium 8.0 (L) 8.9 -  10.3 mg/dL   GFR calc non Af Amer >60 >60 mL/min   GFR calc Af Amer >60 >60 mL/min    Comment: (NOTE) The eGFR has been calculated using the CKD EPI equation. This calculation has not been validated in all clinical situations. eGFR's persistently <60 mL/min signify possible Chronic Kidney Disease.    Anion gap 6 5 - 15    Comment: Performed at [facility], [place] [address], [place], [place] [address]  CBC     Status: Abnormal   Collection Time: 12/08/17  6:20 AM  Result Value Ref Range   WBC 15.7 (H) 4.0 - 10.5 K/uL   RBC 4.22 4.22 - 5.81 MIL/uL   Hemoglobin 13.0 13.0 - 17.0 g/dL   HCT 39.3 39.0 - 52.0 %   MCV 93.1 78.0 - 100.0 fL   MCH 30.8 26.0 - 34.0 pg   MCHC 33.1 30.0 - 36.0 g/dL   RDW 15.0 11.5 - 15.5 %   Platelets 218 150 - 400 K/uL    Comment: Performed at [facility], [place] [address], [place], [place] [address]  Troponin I     Status: None   Collection Time: 12/08/17 10:13 AM  Result  Value Ref Range   Troponin I <0.03 <0.03 ng/mL    Comment: Performed at [facility], [place] [address], [place], [place] [address]  Culture, sputum-assessment     Status: None   Collection Time: 12/08/17 10:13 AM  Result Value Ref Range   Specimen Description SPUTUM    Special Requests NONE    Sputum evaluation      THIS SPECIMEN IS ACCEPTABLE FOR SPUTUM CULTURE Performed at [facility], [place] [address], [place], [place] [address]    Report Status 12/08/2017 FINAL    Dg Chest 2 View  Result Date: 12/07/2017 CLINICAL DATA:  Initial evaluation for acute fever, shortness of breath. EXAM: CHEST - 2 VIEW COMPARISON:  Prior CT from 11/26/2017. FINDINGS: Cardiac and mediastinal silhouettes are stable in size and contour, and remain within normal limits. Aortic atherosclerosis. Lungs are hyperinflated with changes related COPD. Calcified granuloma projects over the left hemidiaphragm, stable. Patchy and linear right basilar opacity, which may reflect atelectasis and/or infiltrate. No pulmonary edema or pleural effusion. No pneumothorax. No acute osseus abnormality. IMPRESSION: 1. Patchy and linear right basilar opacity, which may reflect atelectasis and/or infiltrate. 2. Underlying COPD. 3. Aortic atherosclerosis. Electronically Signed   By: Jeannine Boga M.D.   On: 12/07/2017 18:13    Pending Labs Unresulted Labs (From admission, onward)   Start     Ordered   12/14/17 0500  Creatinine, serum  (enoxaparin (LOVENOX)    CrCl >/= 30 ml/min)  Weekly,   R    Comments:  while on enoxaparin therapy    12/07/17 2128   12/09/17 0500  CBC  Tomorrow morning,   R     12/08/17 1312   12/09/17 0865  Basic metabolic panel  Tomorrow morning,   R     12/08/17 1312   12/09/17 0500  Magnesium  Tomorrow morning,   R     12/08/17 1312   12/08/17 1013  Culture, respiratory (NON-Expectorated)  Once,   R     12/08/17 1013      Vitals/Pain Today's Vitals   12/08/17  0930 12/08/17 1015 12/08/17 1100 12/08/17 1230  BP: 111/71 131/85 113/71 117/67  Pulse: 83 97 90 89  Resp: 16 15 18  (!) 24  Temp:  TempSrc:      SpO2: 96% 97% 99% 99%  Weight:      Height:      PainSc:        Isolation Precautions No active isolations  Medications Medications  fluticasone furoate-vilanterol (BREO ELLIPTA) 200-25 MCG/INH 1 puff (1 puff Inhalation Given 12/08/17 1037)  diltiazem (CARDIZEM CD) 24 hr capsule 240 mg (240 mg Oral Given 12/08/17 1003)  ipratropium (ATROVENT) nebulizer solution 0.5 mg (0.5 mg Nebulization Given 12/07/17 2144)  pregabalin (LYRICA) capsule 150 mg (150 mg Oral Given 12/08/17 1002)  oxyCODONE (Oxy IR/ROXICODONE) immediate release tablet 10 mg (has no administration in time range)  tamsulosin (FLOMAX) capsule 0.4 mg (0.4 mg Oral Given 12/08/17 1002)  tiotropium (SPIRIVA) inhalation capsule 18 mcg (18 mcg Inhalation Given 12/08/17 1037)  enoxaparin (LOVENOX) injection 40 mg (40 mg Subcutaneous Given 12/07/17 2300)  zolpidem (AMBIEN) tablet 5 mg (5 mg Oral Given 12/08/17 0053)  acetaminophen (TYLENOL) tablet 650 mg (has no administration in time range)    Or  acetaminophen (TYLENOL) suppository 650 mg (has no administration in time range)  HYDROcodone-acetaminophen (NORCO/VICODIN) 5-325 MG per tablet 1-2 tablet (has no administration in time range)  ondansetron (ZOFRAN) tablet 4 mg (has no administration in time range)    Or  ondansetron (ZOFRAN) injection 4 mg (has no administration in time range)  albuterol (PROVENTIL) (2.5 MG/3ML) 0.083% nebulizer solution 2.5 mg (has no administration in time range)  guaiFENesin (MUCINEX) 12 hr tablet 600 mg (600 mg Oral Given 12/08/17 1003)  metoprolol tartrate (LOPRESSOR) injection 2.5 mg (2.5 mg Intravenous Given 12/07/17 2258)  diltiazem (CARDIZEM CD) 24 hr capsule 120 mg (has no administration in time range)  ceFEPIme (MAXIPIME) 2 g in sodium chloride 0.9 % 100 mL IVPB (has no administration in time range)   vancomycin (VANCOCIN) 1,250 mg in sodium chloride 0.9 % 250 mL IVPB (has no administration in time range)  predniSONE (DELTASONE) tablet 40 mg (has no administration in time range)  piperacillin-tazobactam (ZOSYN) IVPB 3.375 g (0 g Intravenous Stopped 12/07/17 1817)  vancomycin (VANCOCIN) IVPB 1000 mg/200 mL premix (0 mg Intravenous Stopped 12/07/17 1934)  sodium chloride 0.9 % bolus 2,000 mL (2,000 mLs Intravenous Given 12/07/17 1741)  acetaminophen (TYLENOL) tablet 650 mg (650 mg Oral Given 12/07/17 2231)    Mobility walks

## 2017-12-08 NOTE — Progress Notes (Signed)
PHARMACY NOTE:  ANTIMICROBIAL RENAL DOSAGE ADJUSTMENT  Current antimicrobial regimen includes a mismatch between antimicrobial dosage and estimated renal function.  As per policy approved by the Pharmacy & Therapeutics and Medical Executive Committees, the antimicrobial dosage will be adjusted accordingly.  Current antimicrobial dosage:  Cefepime 2gm IV q12h  Indication: PNA  Renal Function:  Estimated Creatinine Clearance: 59.2 mL/min (by C-G formula based on SCr of 1.09 mg/dL). []      On intermittent HD, scheduled: []      On CRRT    Antimicrobial dosage has been changed to:  Cefepime 2gm IV q8h    Thank you for allowing pharmacy to be a part of this patient's care.  Dolly Rias RPh 12/08/2017, 12:22 PM Pager [phone]

## 2017-12-08 NOTE — ED Notes (Signed)
Bed: KF84 Expected date:  Expected time:  Means of arrival:  Comments: Holding patients

## 2017-12-08 NOTE — Progress Notes (Signed)
PROGRESS NOTE    [name] [name]  DUK:[id number] DOB: Jun 22, 1941 DOA: 12/07/2017 PCP: [name], MD    Brief Narrative:   77 year old with past medical history relevant for paroxysmal atrial fibrillation, chronic diastolic heart failure by echo on 11/26/2017, Gold stage III COPD by PFTs on 05/21/2016 (FEV1 of 46%) on 4 liters of home oxygen at night, history of bladder neck contracture, hyperlipidemia, abdominal aortic aneurysm unknown size, hyperlipidemia, neuropathic pain, BPH who presented to his pulmonologist on 12/07/2017 and was noted to be febrile, lethargic, hypoxic and hypotensive.  Patient was admitted for sepsis from likely a pulmonary source.   Assessment & Plan:   Active Problems:   ATRIAL FIBRILLATION, PAROXYSMAL   Bladder neck contracture   Mixed hyperlipidemia   COPD, severe (HCC)   Leukocytosis   Sepsis ([place])   #) Sepsis secondary to pneumonia: Based on his recent hospitalization, his recurrent use of steroids including inhaled and oral he meets criteria for hospital-acquired pneumonia and possibly pneumonia in an immunocompromised individual.  He has had 4 admissions in the past year and oddly enough is only on 4 L at night but none during the day.  Currently least he does not appear to be wheezing most of his symptomatology is likely secondary to pneumonia rather than a recurrence of his COPD exacerbation. -Continue L ABA/ICS, L AMA -Discontinue IV methylprednisolone and transition patient to oral prednisone 40 mg, patient might need extended course of steroids -Consider starting every other day azithromycin and/or PDE4i (not clear patient can afford this) -Continue scheduled bronchodilators and as needed -Continue IV cefepime and vancomycin for hospital-acquired pneumonia -We will consider sending off LDH for PCP and/or performing CT chest to rule out fungal pneumonia  #) Paroxysmal atrial fibrillation: Patient apparently has had episodes of recalcitrant  tachycardia with bronchodilators at home.  He unfortunately cannot afford levalbuterol though again the data for this causing less tachycardia is fairly poor. -Continue inhaled bronchodilators -Continue diltiazem 240 mg every morning and 120 mg nightly -Unclear why patient is not on anticoagulation, will address with the patient  #)AAA: No documented evidence on imaging here at the patient has AAA  #) Chronic pain: -Continue pregabalin 150 mg 3 times daily -Continue oxycodone 10 mg every 6 hours as needed  #) Benign prostatic hypertrophy: -Continue tamsulosin 0.4 mg daily  Fluids: Tolerating p.o. Liquids: Monitor and supplement Treatment: Heart healthy diet  Prophylaxis: Enoxaparin  Disposition: Pending improved respiratory status  Full code    Consultants:   None  Procedures: (Don't include imaging studies which can be auto populated. Include things that cannot be auto populated i.e. Echo, Carotid and venous dopplers, Foley, Bipap, HD, tubes/drains, wound vac, central lines etc)  None  Antimicrobials: (specify start and planned stop date. Auto populated tables are space occupying and do not give end dates)  IV cefepime and vancomycin started 12/07/2017   Subjective: Patient reports that his dyspnea on exertion is improved.  He is much more comfortable on 2 L nasal cannula.  He denies any nausea, vomiting, diarrhea.  Objective: Vitals:   12/08/17 0930 12/08/17 1015 12/08/17 1100 12/08/17 1230  BP: 111/71 131/85 113/71 117/67  Pulse: 83 97 90 89  Resp: 16 15 18  (!) 24  Temp:      TempSrc:      SpO2: 96% 97% 99% 99%  Weight:      Height:        Intake/Output Summary (Last 24 hours) at 12/08/2017 1252 Last data filed at 12/08/2017  1113 Gross per 24 hour  Intake 900 ml  Output -  Net 900 ml   Filed Weights   12/07/17 1714 12/07/17 2207  Weight: 74.8 kg (165 lb) 72.6 kg (160 lb)    Examination:  General exam: Appears calm and comfortable  Respiratory  system: No increased work of breathing, diminished lung sounds in bilateral lungs, no wheezes, rales, rhonchi Cardiovascular system: Regular rate and rhythm, no murmurs. Gastrointestinal system: Abdomen is nondistended, soft and nontender. No organomegaly or masses felt. Normal bowel sounds heard. Central nervous system: Alert and oriented. No focal neurological deficits. Extremities: No lower extremity edema Skin: No rashes on visible skin Psychiatry: Judgement and insight appear normal. Mood & affect appropriate.     Data Reviewed: I have personally reviewed following labs and imaging studies  CBC: Recent Labs  Lab 12/07/17 1719 12/08/17 0620  WBC 25.4* 15.7*  NEUTROABS 20.6*  --   HGB 13.6 13.0  HCT 40.9 39.3  MCV 92.1 93.1  PLT 283 301   Basic Metabolic Panel: Recent Labs  Lab 12/07/17 1719 12/08/17 0620  NA 140 140  K 4.1 4.1  CL 102 108  CO2 27 26  GLUCOSE 141* 190*  BUN 23* 21*  CREATININE 1.55* 1.09  CALCIUM 8.4* 8.0*   GFR: Estimated Creatinine Clearance: 59.2 mL/min (by C-G formula based on SCr of 1.09 mg/dL). Liver Function Tests: Recent Labs  Lab 12/07/17 1719  AST 20  ALT 23  ALKPHOS 64  BILITOT 1.1  PROT 5.6*  ALBUMIN 2.6*   No results for input(s): LIPASE, AMYLASE in the last 168 hours. No results for input(s): AMMONIA in the last 168 hours. Coagulation Profile: No results for input(s): INR, PROTIME in the last 168 hours. Cardiac Enzymes: Recent Labs  Lab 12/08/17 0058 12/08/17 0329 12/08/17 1013  TROPONINI <0.03 <0.03 <0.03   BNP (last 3 results) No results for input(s): PROBNP in the last 8760 hours. HbA1C: No results for input(s): HGBA1C in the last 72 hours. CBG: No results for input(s): GLUCAP in the last 168 hours. Lipid Profile: No results for input(s): CHOL, HDL, LDLCALC, TRIG, CHOLHDL, LDLDIRECT in the last 72 hours. Thyroid Function Tests: No results for input(s): TSH, T4TOTAL, FREET4, T3FREE, THYROIDAB in the last 72  hours. Anemia Panel: No results for input(s): VITAMINB12, FOLATE, FERRITIN, TIBC, IRON, RETICCTPCT in the last 72 hours. Sepsis Labs: Recent Labs  Lab 12/07/17 1733  LATICACIDVEN 1.86    Recent Results (from the past 240 hour(s))  Blood Culture (routine x 2)     Status: None (Preliminary result)   Collection Time: 12/07/17  5:30 PM  Result Value Ref Range Status   Specimen Description   Final    BLOOD LEFT ANTECUBITAL Performed at [facility] [address], [place], [place] [address]    Special Requests   Final    BOTTLES DRAWN AEROBIC AND ANAEROBIC Blood Culture results may not be optimal due to an excessive volume of blood received in culture bottles Performed at [facility] [address] [place], [place] [address]    Culture   Final    NO GROWTH < 12 HOURS Performed at [facility] [address] [place], [place] [address]    Report Status PENDING  Incomplete  Blood Culture (routine x 2)     Status: None (Preliminary result)   Collection Time: 12/07/17  5:41 PM  Result Value Ref Range Status   Specimen Description   Final  BLOOD RIGHT FOREARM Performed at [facility] [address] [place], [place] [address]    Special Requests   Final    BOTTLES DRAWN AEROBIC AND ANAEROBIC Blood Culture results may not be optimal due to an excessive volume of blood received in culture bottles Performed at [facility] [address] [place], [place] [address]    Culture   Final    NO GROWTH < 12 HOURS Performed at [facility] [address], [place], [place] [address]    Report Status PENDING  Incomplete  Culture, sputum-assessment     Status: None   Collection Time: 12/08/17 10:13 AM  Result Value Ref Range Status   Specimen Description SPUTUM  Final   Special Requests NONE  Final   Sputum evaluation   Final    THIS SPECIMEN IS ACCEPTABLE FOR SPUTUM CULTURE Performed at  [facility], [place] [address] [place], [place] [address]    Report Status 12/08/2017 FINAL  Final         Radiology Studies: Dg Chest 2 View  Result Date: 12/07/2017 CLINICAL DATA:  Initial evaluation for acute fever, shortness of breath. EXAM: CHEST - 2 VIEW COMPARISON:  Prior CT from 11/26/2017. FINDINGS: Cardiac and mediastinal silhouettes are stable in size and contour, and remain within normal limits. Aortic atherosclerosis. Lungs are hyperinflated with changes related COPD. Calcified granuloma projects over the left hemidiaphragm, stable. Patchy and linear right basilar opacity, which may reflect atelectasis and/or infiltrate. No pulmonary edema or pleural effusion. No pneumothorax. No acute osseus abnormality. IMPRESSION: 1. Patchy and linear right basilar opacity, which may reflect atelectasis and/or infiltrate. 2. Underlying COPD. 3. Aortic atherosclerosis. Electronically Signed   By: Jeannine Boga M.D.   On: 12/07/2017 18:13        Scheduled Meds: . diltiazem  120 mg Oral QHS  . diltiazem  240 mg Oral Daily  . enoxaparin (LOVENOX) injection  40 mg Subcutaneous Q24H  . fluticasone furoate-vilanterol  1 puff Inhalation Daily  . guaiFENesin  600 mg Oral BID  . methylPREDNISolone (SOLU-MEDROL) injection  60 mg Intravenous Q6H  . pregabalin  150 mg Oral TID  . tamsulosin  0.4 mg Oral Daily  . tiotropium  18 mcg Inhalation q morning - 10a   Continuous Infusions: . ceFEPime (MAXIPIME) IV    . vancomycin       LOS: 1 day    Time spent: [place], MD [facility]  If 7PM-7AM, please contact night-coverage [email or web address] Password [facility] 12/08/2017, 12:52 PM

## 2017-12-09 DIAGNOSIS — E440 Moderate protein-calorie malnutrition: Secondary | ICD-10-CM

## 2017-12-09 DIAGNOSIS — J189 Pneumonia, unspecified organism: Secondary | ICD-10-CM

## 2017-12-09 LAB — BASIC METABOLIC PANEL
BUN: 33 mg/dL — ABNORMAL HIGH (ref 6–20)
CO2: 24 mmol/L (ref 22–32)
Calcium: 8.7 mg/dL — ABNORMAL LOW (ref 8.9–10.3)
Creatinine, Ser: 0.95 mg/dL (ref 0.61–1.24)
GFR calc Af Amer: >60 mL/min (ref >60)
Sodium: 142 mmol/L (ref 135–145)

## 2017-12-09 LAB — MAGNESIUM: Magnesium: 2.2 mg/dL (ref 1.7–2.4)

## 2017-12-09 LAB — BASIC METABOLIC PANEL WITH GFR
Anion gap: 9 (ref 5–15)
Chloride: 109 mmol/L (ref 101–111)
GFR calc non Af Amer: >60 mL/min (ref >60)
Glucose, Bld: 198 mg/dL — ABNORMAL HIGH (ref 65–99)
Potassium: 3.5 mmol/L (ref 3.5–5.1)

## 2017-12-09 LAB — CBC
HCT: 35.7 % — ABNORMAL LOW (ref 39.0–52.0)
Hemoglobin: 12.1 g/dL — ABNORMAL LOW (ref 13.0–17.0)
MCH: 30.3 pg (ref 26.0–34.0)
MCHC: 33.9 g/dL (ref 30.0–36.0)
MCV: 89.5 fL (ref 78.0–100.0)
Platelets: 215 K/uL (ref 150–400)
RBC: 3.99 MIL/uL — ABNORMAL LOW (ref 4.22–5.81)
RDW: 14 % (ref 11.5–15.5)
WBC: 17.1 K/uL — ABNORMAL HIGH (ref 4.0–10.5)

## 2017-12-09 MED ORDER — ENSURE ENLIVE PO LIQD
237.0000 mL | Freq: Two times a day (BID) | ORAL | Status: DC
  Administered 2017-12-09 – 2017-12-10 (×3): 237 mL via ORAL

## 2017-12-09 MED ORDER — TIOTROPIUM BROMIDE MONOHYDRATE 18 MCG IN CAPS: 18.0000 ug | ORAL_CAPSULE | Freq: Every day | RESPIRATORY_TRACT | Status: DC

## 2017-12-09 MED ORDER — TIOTROPIUM BROMIDE MONOHYDRATE 18 MCG IN CAPS
18.0000 ug | ORAL_CAPSULE | Freq: Every day | RESPIRATORY_TRACT | Status: DC
  Administered 2017-12-09: 18 ug via RESPIRATORY_TRACT

## 2017-12-09 MED ORDER — ASPIRIN EC 81 MG PO TBEC
81.0000 mg | DELAYED_RELEASE_TABLET | Freq: Every day | ORAL | Status: DC
  Administered 2017-12-09: 81 mg via ORAL
  Filled 2017-12-09 (×2): qty 1

## 2017-12-09 NOTE — Progress Notes (Signed)
PROGRESS NOTE    [name] [name]  ATF:[id number] DOB: 12/19/1940 DOA: 12/07/2017 PCP: [name], MD     Brief Narrative:  [name] [name] is a 77 year old with past medical history relevant for paroxysmal atrial fibrillation, chronic diastolic heart failure by echo on 11/26/2017, Gold stage III COPD by PFTs on 05/21/2016 (FEV1 of 46%) on 4 liters of home oxygen at night, history of bladder neck contracture, hyperlipidemia, abdominal aortic aneurysm unknown size, hyperlipidemia, neuropathic pain, BPH who presented to his pulmonologist on 12/07/2017 and was noted to be febrile, lethargic, hypoxic and hypotensive.  Patient was admitted for sepsis secondary to HCAP.   Assessment & Plan:   Principal Problem:   HCAP (healthcare-associated pneumonia) Active Problems:   ATRIAL FIBRILLATION, PAROXYSMAL   Bladder neck contracture   Mixed hyperlipidemia   COPD, severe (HCC)   Leukocytosis   Sepsis ([place])   Sepsis secondary to HCAP -CXR: Patchy and linear right basilar opacity, which may reflect atelectasis and/or infiltrate. -MRSA PCR negative, discontinue vanco -Influenza negative -Strep pneumo negative  -Sputum culture pending  -Blood culture negative to date  -Continue cefepime  Acute on chronic hypoxemic respiratory failure  -Wears 4 L at nighttime, none during the day -Wean to room air as able   COPD exacerbation -IV Solu-Medrol transition to oral prednisone 40 mg daily -Monitor leukocytosis while on steroids   Paroxysmal A fib -CHADSVASC 2  -Per PCP office notes in Dec 2018, patient had not wanted to pursue anticoagulation.  He had been recommended at least baby aspirin.  When asked patient this morning, patient was unaware of his diagnosis until his admission to [facility] earlier this month.  He states that he has been on every other day aspirin due to bruising but has not been on aspirin in the recent months. -Continue diltiazem -Resume baby aspirin daily   Bladder neck  contracture -Follows with Dr. Risa Grill [facility] -Daily Flomax -Due to urinary retention, foley catheter was placed.  Will need voiding trial prior to discharge  History AAA -Stable  Chronic pain -Continue pregabalin, oxycodone   DVT prophylaxis: Lovenox  Code Status: Full code Family Communication: No family at bedside Disposition Plan: Increase mobility, PT consulted, transfer to telemetry unit today   Consultants:   None  Procedures:   None  Antimicrobials:  Anti-infectives (From admission, onward)   Start     Dose/Rate Route Frequency Ordered Stop   12/08/17 2200  vancomycin (VANCOCIN) 1,250 mg in sodium chloride 0.9 % 250 mL IVPB  Status:  Discontinued     1,250 mg 166.7 mL/hr over 90 Minutes Intravenous Every 24 hours 12/08/17 1227 12/09/17 0728   12/08/17 1800  ceFEPIme (MAXIPIME) 2 g in sodium chloride 0.9 % 100 mL IVPB     2 g 200 mL/hr over 30 Minutes Intravenous Every 8 hours 12/08/17 1221     12/07/17 2200  ceFEPIme (MAXIPIME) 1 g in sodium chloride 0.9 % 100 mL IVPB  Status:  Discontinued     1 g 200 mL/hr over 30 Minutes Intravenous Every 8 hours 12/07/17 2128 12/07/17 2151   12/07/17 2200  vancomycin (VANCOCIN) IVPB 1000 mg/200 mL premix  Status:  Discontinued     1,000 mg 200 mL/hr over 60 Minutes Intravenous Every 24 hours 12/07/17 2016 12/08/17 1227   12/07/17 2200  ceFEPIme (MAXIPIME) 2 g in sodium chloride 0.9 % 100 mL IVPB  Status:  Discontinued     2 g 200 mL/hr over 30 Minutes Intravenous Every 12  hours 12/07/17 2151 12/08/17 1221   12/07/17 1730  piperacillin-tazobactam (ZOSYN) IVPB 3.375 g     3.375 g 100 mL/hr over 30 Minutes Intravenous  Once 12/07/17 1719 12/07/17 1817   12/07/17 1730  vancomycin (VANCOCIN) IVPB 1000 mg/200 mL premix     1,000 mg 200 mL/hr over 60 Minutes Intravenous  Once 12/07/17 1719 12/07/17 1934       Subjective: Patient feeling slightly better this morning.  Denies any chest pain or shortness of breath.   Wondering about urology consultation for his urinary retention.  Has not been out of bed.  Objective: Vitals:   12/09/17 0900 12/09/17 1000 12/09/17 1100 12/09/17 1200  BP: (!) 119/48 (!) 126/44 (!) 122/49 (!) 137/39  Pulse:      Resp: (!) 23 16 17 19   Temp:    97.7 F (36.5 C)  TempSrc:    Oral  SpO2:      Weight:      Height:        Intake/Output Summary (Last 24 hours) at 12/09/2017 1225 Last data filed at 12/09/2017 0934 Gross per 24 hour  Intake 1030 ml  Output 700 ml  Net 330 ml   Filed Weights   12/07/17 1714 12/07/17 2207  Weight: 74.8 kg (165 lb) 72.6 kg (160 lb)    Examination:  General exam: Appears calm and comfortable  Respiratory system: Decreased breath sounds bilaterally. Respiratory effort normal.  On nasal cannula O2 Cardiovascular system: S1 & S2 heard, irregular rhythm, sinus tachycardia rate 106 with PACs on telemetry. No JVD, murmurs, rubs, gallops or clicks. No pedal edema. Gastrointestinal system: Abdomen is nondistended, soft and nontender. No organomegaly or masses felt. Normal bowel sounds heard. Central nervous system: Alert and oriented. No focal neurological deficits. Extremities: Symmetric 5 x 5 power. Skin: No rashes, lesions or ulcers Psychiatry: Judgement and insight appear normal. Mood & affect appropriate.   Data Reviewed: I have personally reviewed following labs and imaging studies  CBC: Recent Labs  Lab 12/07/17 1719 12/08/17 0620 12/09/17 0318  WBC 25.4* 15.7* 17.1*  NEUTROABS 20.6*  --   --   HGB 13.6 13.0 12.1*  HCT 40.9 39.3 35.7*  MCV 92.1 93.1 89.5  PLT 283 218 811   Basic Metabolic Panel: Recent Labs  Lab 12/07/17 1719 12/08/17 0620 12/09/17 0318  NA 140 140 142  K 4.1 4.1 3.5  CL 102 108 109  CO2 27 26 24   GLUCOSE 141* 190* 198*  BUN 23* 21* 33*  CREATININE 1.55* 1.09 0.95  CALCIUM 8.4* 8.0* 8.7*  MG  --   --  2.2   GFR: Estimated Creatinine Clearance: 67.9 mL/min (by C-G formula based on SCr of 0.95  mg/dL). Liver Function Tests: Recent Labs  Lab 12/07/17 1719  AST 20  ALT 23  ALKPHOS 64  BILITOT 1.1  PROT 5.6*  ALBUMIN 2.6*   No results for input(s): LIPASE, AMYLASE in the last 168 hours. No results for input(s): AMMONIA in the last 168 hours. Coagulation Profile: No results for input(s): INR, PROTIME in the last 168 hours. Cardiac Enzymes: Recent Labs  Lab 12/08/17 0058 12/08/17 0329 12/08/17 1013  TROPONINI <0.03 <0.03 <0.03   BNP (last 3 results) No results for input(s): PROBNP in the last 8760 hours. HbA1C: No results for input(s): HGBA1C in the last 72 hours. CBG: No results for input(s): GLUCAP in the last 168 hours. Lipid Profile: No results for input(s): CHOL, HDL, LDLCALC, TRIG, CHOLHDL, LDLDIRECT in the last 72  hours. Thyroid Function Tests: No results for input(s): TSH, T4TOTAL, FREET4, T3FREE, THYROIDAB in the last 72 hours. Anemia Panel: No results for input(s): VITAMINB12, FOLATE, FERRITIN, TIBC, IRON, RETICCTPCT in the last 72 hours. Sepsis Labs: Recent Labs  Lab 12/07/17 1733  LATICACIDVEN 1.86    Recent Results (from the past 240 hour(s))  Blood Culture (routine x 2)     Status: None (Preliminary result)   Collection Time: 12/07/17  5:30 PM  Result Value Ref Range Status   Specimen Description   Final    BLOOD LEFT ANTECUBITAL Performed at [facility] [address] [place], [place] [address]    Special Requests   Final    BOTTLES DRAWN AEROBIC AND ANAEROBIC Blood Culture results may not be optimal due to an excessive volume of blood received in culture bottles Performed at [facility] [address] [place], [place] [address]    Culture   Final    NO GROWTH 2 DAYS Performed at [facility] [address] [place], [place] [address]    Report Status PENDING  Incomplete  Blood Culture (routine x 2)     Status: None (Preliminary result)   Collection Time: 12/07/17  5:41 PM  Result  Value Ref Range Status   Specimen Description   Final    BLOOD RIGHT FOREARM Performed at [facility] [address] [place], [place] [address]    Special Requests   Final    BOTTLES DRAWN AEROBIC AND ANAEROBIC Blood Culture results may not be optimal due to an excessive volume of blood received in culture bottles Performed at [facility] [address], [place], [place] [address]    Culture   Final    NO GROWTH 2 DAYS Performed at [facility] [address], [place], [place] [address]    Report Status PENDING  Incomplete  Culture, sputum-assessment     Status: None   Collection Time: 12/08/17 10:13 AM  Result Value Ref Range Status   Specimen Description SPUTUM  Final   Special Requests NONE  Final   Sputum evaluation   Final    THIS SPECIMEN IS ACCEPTABLE FOR SPUTUM CULTURE Performed at [facility], [place] [address], [place], [place] [address]    Report Status 12/08/2017 FINAL  Final  Culture, respiratory (NON-Expectorated)     Status: None (Preliminary result)   Collection Time: 12/08/17 10:13 AM  Result Value Ref Range Status   Specimen Description   Final    SPUTUM Performed at [facility] [address] [place], [place] [address]    Special Requests   Final    NONE Reflexed from [id number] Performed at [facility], [place] [address], [place], [place] [address]    Gram Stain   Final    RARE WBC PRESENT, PREDOMINANTLY PMN FEW SQUAMOUS EPITHELIAL CELLS PRESENT FEW GRAM POSITIVE COCCI    Culture   Final    CULTURE REINCUBATED FOR BETTER GROWTH Performed at [facility], [place] [address], [place], [place] [address]    Report Status PENDING  Incomplete  MRSA PCR Screening     Status: None   Collection Time: 12/08/17  3:36 PM  Result Value Ref Range Status   MRSA by PCR NEGATIVE NEGATIVE Final    Comment:        The GeneXpert MRSA Assay (FDA approved for NASAL  specimens only), is one component of  a comprehensive MRSA colonization surveillance program. It is not intended to diagnose MRSA infection nor to guide or monitor treatment for MRSA infections. Performed at [facility], [place] [address], [place], [place] [address]        Radiology Studies: Dg Chest 2 View  Result Date: 12/07/2017 CLINICAL DATA:  Initial evaluation for acute fever, shortness of breath. EXAM: CHEST - 2 VIEW COMPARISON:  Prior CT from 11/26/2017. FINDINGS: Cardiac and mediastinal silhouettes are stable in size and contour, and remain within normal limits. Aortic atherosclerosis. Lungs are hyperinflated with changes related COPD. Calcified granuloma projects over the left hemidiaphragm, stable. Patchy and linear right basilar opacity, which may reflect atelectasis and/or infiltrate. No pulmonary edema or pleural effusion. No pneumothorax. No acute osseus abnormality. IMPRESSION: 1. Patchy and linear right basilar opacity, which may reflect atelectasis and/or infiltrate. 2. Underlying COPD. 3. Aortic atherosclerosis. Electronically Signed   By: Jeannine Boga M.D.   On: 12/07/2017 18:13      Scheduled Meds: . aspirin EC  81 mg Oral Daily  . diltiazem  120 mg Oral QHS  . diltiazem  240 mg Oral Daily  . enoxaparin (LOVENOX) injection  40 mg Subcutaneous Q24H  . fluticasone furoate-vilanterol  1 puff Inhalation Daily  . guaiFENesin  600 mg Oral BID  . predniSONE  40 mg Oral Q breakfast  . pregabalin  150 mg Oral TID  . tamsulosin  0.4 mg Oral Daily  . tiotropium  18 mcg Inhalation q morning - 10a   Continuous Infusions: . ceFEPime (MAXIPIME) IV Stopped (12/09/17 0934)     LOS: 2 days    Time spent: 25 minutes   Dessa Phi, DO [facility] [email or web address] Password [facility] 12/09/2017, 12:25 PM

## 2017-12-09 NOTE — Progress Notes (Signed)
Patient has not voided. Pt c/o abdominal pressure. Abdomen is distended. Pt attempted x2 to urinate, but was unsuccessful. NP on call notified. New orders placed.

## 2017-12-09 NOTE — Progress Notes (Signed)
Initial Nutrition Assessment  DOCUMENTATION CODES:   Non-severe (moderate) malnutrition in context of chronic illness  INTERVENTION:   Continue Ensure Enlive po BID, each supplement provides 350 kcal and 20 grams of protein   NUTRITION DIAGNOSIS:   Moderate Malnutrition related to chronic illness(COPD) as evidenced by moderate fat depletion, mild-moderate muscle depletion.  GOAL:   Patient will meet greater than or equal to 90% of their needs   MONITOR:   PO intake, Supplement acceptance, Labs, Weight trends  REASON FOR ASSESSMENT:   Malnutrition Screening Tool    ASSESSMENT:   77 yo male admitted 3/25 with sepsis secondary to nosocomial pneumonia. PMH Afib, CHF/COPD on4L O2 at home hyperlipidemia, neuropathic pain, bladder neck contracture.   Spoke with pt who reports:   Was active (working in garden, walking with wife 3x/week) and was eating "well for an older man" until his COPD exacerbation that led to admission to [facility] two weeks ago.   His appetite has improved since admission as his pneumonia and breathing improve  He only ate 1/3 of his lunch, but reported that it was because he did not care for the food   Pt eats a diet that is primarily whole grain, legumes, fresh fruits/vegetables, whole milk with oat, olive oil and limited red meat and treats since wife makes food and she is s/p AAA; However, he reports that he has been eating half as much as he used to, estimating his meal sizes as a "deck of cards" (2 meals/day + snack + ensure) over past year  Pt reports that he has progressively lost weight over the past 2 years d/t his COPD. He did not realize that this was potentially preventable and could be addressed in part through diet  The importance of eating sufficient calories and protein was discussed with pt. Pt was advised to try eating smaller, more frequent meals instead of two meals/day   Pt drinks 1 Ensure/day at home and is willing to drink them  in hospital (drank 100% of the Ensure offered earlier today)  Wears dentures, but denies any issues with chewing, swallowing, GERD, lower GI issues. Reports being very regular with healthy BM's.  Pt's reported gradual, non-signicant wt loss of over past two years confirmed from [place] Significant loss associated with acute illness, but weight has not continued to decrease.   Wt Readings from Last 30 Encounters:  12/07/17 160 lb (72.6 kg)  12/07/17 152 lb (68.9 kg)  12/04/17 154 lb (69.9 kg)  11/26/17 152 lb 5.4 oz (69.1 kg)  11/20/17 157 lb (71.2 kg)  11/18/17 165 lb (74.8 kg)  11/03/17 158 lb 0.6 oz (71.7 kg)  10/27/17 164 lb 1.3 oz (74.4 kg)  10/06/17 161 lb (73 kg)  09/02/17 167 lb (75.8 kg)  08/12/17 168 lb (76.2 kg)  06/23/17 163 lb (73.9 kg)  12/24/16 161 lb (73 kg)  10/21/16 159 lb 9.6 oz (72.4 kg)  09/18/16 175 lb (79.4 kg)  10/29/15 168 lb 12.8 oz (76.6 kg)     Medications include prednisone  Labs reviewed.  NUTRITION - FOCUSED PHYSICAL EXAM:    Most Recent Value  Orbital Region  Moderate depletion  Upper Arm Region  Moderate depletion  Thoracic and Lumbar Region  No depletion  Buccal Region  Moderate depletion  Temple Region  Moderate depletion  Clavicle Bone Region  Moderate depletion  Clavicle and Acromion Bone Region  Mild depletion  Scapular Bone Region  No depletion  Dorsal Hand  No  depletion  Patellar Region  Mild depletion  Anterior Thigh Region  Moderate depletion  Posterior Calf Region  Moderate depletion  Edema (RD Assessment)  None  Hair  Reviewed  Eyes  Reviewed  Mouth  Reviewed  Skin  Reviewed  Nails  Reviewed       Diet Order:  Diet Heart Room service appropriate? Yes; Fluid consistency: Thin  EDUCATION NEEDS:   Education needs have been addressed  Skin:  Skin Assessment: Reviewed RN Assessment  Last BM:  3/26  Height:   Ht Readings from Last 1 Encounters:  12/07/17 5\' 10"  (1.778 m)    Weight:   Wt Readings  from Last 1 Encounters:  12/07/17 160 lb (72.6 kg)    Ideal Body Weight:  75.4 kg  BMI:  Body mass index is 22.96 kg/m.  Estimated Nutritional Needs:   Kcal:  1950-2150 kcals (HBE *1.3-1.5 rounded)  Protein:  98-108 grams (20% kcal)  Fluid:  >=1.9 L/day    Edmonia Lynch Dietetic Intern Pager: [phone]

## 2017-12-09 NOTE — Plan of Care (Signed)
  Problem: Elimination: Goal: Will not experience complications related to bowel motility  Problem: Elimination: Goal: Will not experience complications related to urinary retention Outcome: Not Progressing

## 2017-12-10 LAB — CBC
HCT: 35.4 % — ABNORMAL LOW (ref 39.0–52.0)
HEMOGLOBIN: 11.9 g/dL — AB (ref 13.0–17.0)
MCH: 30.1 pg (ref 26.0–34.0)
MCHC: 33.6 g/dL (ref 30.0–36.0)
MCV: 89.4 fL (ref 78.0–100.0)
Platelets: 206 10*3/uL (ref 150–400)
RBC: 3.96 MIL/uL — ABNORMAL LOW (ref 4.22–5.81)
RDW: 14 % (ref 11.5–15.5)
WBC: 17.1 10*3/uL — ABNORMAL HIGH (ref 4.0–10.5)

## 2017-12-10 LAB — BASIC METABOLIC PANEL
Anion gap: 7 (ref 5–15)
BUN: 40 mg/dL — AB (ref 6–20)
CHLORIDE: 113 mmol/L — AB (ref 101–111)
CO2: 26 mmol/L (ref 22–32)
CREATININE: 0.86 mg/dL (ref 0.61–1.24)
Calcium: 8.6 mg/dL — ABNORMAL LOW (ref 8.9–10.3)
GFR calc Af Amer: >60 mL/min (ref >60)
GFR calc non Af Amer: >60 mL/min (ref >60)
Glucose, Bld: 147 mg/dL — ABNORMAL HIGH (ref 65–99)
Potassium: 3.4 mmol/L — ABNORMAL LOW (ref 3.5–5.1)
SODIUM: 146 mmol/L — AB (ref 135–145)

## 2017-12-10 LAB — CULTURE, RESPIRATORY W GRAM STAIN

## 2017-12-10 LAB — CULTURE, RESPIRATORY: CULTURE: NORMAL

## 2017-12-10 MED ORDER — APIXABAN 5 MG PO TABS: 5.0000 mg | ORAL_TABLET | Freq: Two times a day (BID) | ORAL | 0 refills | Status: DC

## 2017-12-10 MED ORDER — LEVOFLOXACIN 750 MG PO TABS
750.0000 mg | ORAL_TABLET | Freq: Every day | ORAL | Status: DC
  Administered 2017-12-10: 750 mg via ORAL
  Filled 2017-12-10: qty 1

## 2017-12-10 MED ORDER — PREDNISONE 10 MG PO TABS: ORAL_TABLET | ORAL | 0 refills | Status: DC

## 2017-12-10 MED ORDER — APIXABAN 5 MG PO TABS
5.0000 mg | ORAL_TABLET | Freq: Two times a day (BID) | ORAL | Status: DC
  Administered 2017-12-10: 5 mg via ORAL
  Filled 2017-12-10: qty 1

## 2017-12-10 MED ORDER — LEVOFLOXACIN 750 MG PO TABS: 750.0000 mg | ORAL_TABLET | Freq: Every day | ORAL | 0 refills | Status: AC

## 2017-12-10 MED ORDER — POTASSIUM CHLORIDE CRYS ER 20 MEQ PO TBCR
40.0000 meq | EXTENDED_RELEASE_TABLET | Freq: Once | ORAL | Status: AC
  Administered 2017-12-10: 40 meq via ORAL
  Filled 2017-12-10: qty 2

## 2017-12-10 NOTE — Care Management Important Message (Signed)
Important Message  Patient Details  Name: [name] [name] MRN: [id number] Date of Birth: 10/03/1940   Medicare Important Message Given:  Yes    [name] 12/10/2017, 11:34 AMImportant Message  Patient Details  Name: [name] [name] MRN: [id number] Date of Birth: 09-08-1941   Medicare Important Message Given:  Yes    [name] 12/10/2017, 11:34 AM

## 2017-12-10 NOTE — Discharge Instructions (Signed)

## 2017-12-10 NOTE — Evaluation (Signed)
Physical Therapy Evaluation Patient Details Name: [name] [name] MRN: [id number] DOB: Sep 01, 1941 Today's Date: 12/10/2017   History of Present Illness  77 y.o. male, with past medical history significant for COPD and history of prostate cancer presenting  to the ED with 1 day history of acute shortness of breath with fever and chills and cough with greenish sputum.  Pt admitted for HCAP.     Clinical Impression  Pt admitted with above diagnosis. Pt currently with functional limitations due to the deficits listed below (see PT Problem List). PTA pt lived at home with wife, independent with mobility. On eval, supervision provided for transfers and min guard assist ambulation 350 feet without AD. Pt on RA throughout session. SpO2 95% at rest, desat to 90% during ambulation with improvement to 94% with standing rest break and pursed lip breathing. SpO2 97% upon return to room. Pt will benefit from skilled PT to increase their independence and safety with mobility to allow discharge to the venue listed below.  PT to follow acutely. No follow up services indicated.       Follow Up Recommendations No PT follow up;Supervision - Intermittent    Equipment Recommendations  None recommended by PT    Recommendations for Other Services       Precautions / Restrictions Precautions Precautions: Other (comment) Precaution Comments: watch sats      Mobility  Bed Mobility Overal bed mobility: Modified Independent                Transfers Overall transfer level: Needs assistance Equipment used: None Transfers: Sit to/from Stand;Stand Pivot Transfers Sit to Stand: Supervision Stand pivot transfers: Supervision       General transfer comment: supervision for safety, no physical assist  Ambulation/Gait Ambulation/Gait assistance: Min guard Ambulation Distance (Feet): 350 Feet Assistive device: None Gait Pattern/deviations: Step-through pattern;Decreased stride length Gait velocity:  decreased Gait velocity interpretation: Below normal speed for age/gender General Gait Details: Pt on RA throughout session. SpO2 95% at rest on RA. Desat to 90% after ambulating 200 feet. Standing rest break with pursed lip breathing brought SpO2 up to 94%. 100 feet return ambulation back to room. SpO2 97% upon sitting in recliner.   Stairs            Wheelchair Mobility    Modified Rankin (Stroke Patients Only)       Balance Overall balance assessment: Mild deficits observed, not formally tested                                           Pertinent Vitals/Pain Pain Assessment: No/denies pain    Home Living Family/patient expects to be discharged to:: Private residence Living Arrangements: Spouse/significant other Available Help at Discharge: Family;Available 24 hours/day Type of Home: House Home Access: Stairs to enter Entrance Stairs-Rails: Right;Left;Can reach both Entrance Stairs-Number of Steps: 4 Home Layout: One level Home Equipment: Shower seat      Prior Function Level of Independence: Independent               Hand Dominance   Dominant Hand: Right    Extremity/Trunk Assessment   Upper Extremity Assessment Upper Extremity Assessment: Overall WFL for tasks assessed    Lower Extremity Assessment Lower Extremity Assessment: Overall WFL for tasks assessed    Cervical / Trunk Assessment Cervical / Trunk Assessment: Kyphotic  Communication   Communication: No difficulties  Cognition  Arousal/Alertness: Awake/alert Behavior During Therapy: WFL for tasks assessed/performed Overall Cognitive Status: Within Functional Limits for tasks assessed                                        General Comments      Exercises     Assessment/Plan    PT Assessment Patient needs continued PT services  PT Problem List Decreased mobility;Cardiopulmonary status limiting activity;Decreased activity tolerance;Decreased  balance       PT Treatment Interventions Therapeutic activities;Gait training;Therapeutic exercise;Patient/family education;Stair training;Balance training;Functional mobility training    PT Goals (Current goals can be found in the Care Plan section)  Acute Rehab PT Goals Patient Stated Goal: get stronger PT Goal Formulation: With patient Time For Goal Achievement: 12/24/17 Potential to Achieve Goals: Good    Frequency Min 3X/week   Barriers to discharge        Co-evaluation               AM-PAC PT "6 Clicks" Daily Activity  Outcome Measure Difficulty turning over in bed (including adjusting bedclothes, sheets and blankets)?: None Difficulty moving from lying on back to sitting on the side of the bed? : None Difficulty sitting down on and standing up from a chair with arms (e.g., wheelchair, bedside commode, etc,.)?: None Help needed moving to and from a bed to chair (including a wheelchair)?: None Help needed walking in hospital room?: A Little Help needed climbing 3-5 steps with a railing? : A Little 6 Click Score: 22    End of Session Equipment Utilized During Treatment: Gait belt Activity Tolerance: Patient tolerated treatment well Patient left: in bed;with call bell/phone within reach Nurse Communication: Mobility status PT Visit Diagnosis: Difficulty in walking, not elsewhere classified (R26.2)    Time: 0600-4599 PT Time Calculation (min) (ACUTE ONLY): 15 min   Charges:   PT Evaluation $PT Eval Low Complexity: 1 Low     PT G Codes:        Lorrin Goodell, PT  Office # [phone] Pager [phone]   Lorriane Shire 12/10/2017, 9:25 AM

## 2017-12-10 NOTE — Progress Notes (Signed)
[place] for Eliquis Indication: Afib  Allergies  Allergen Reactions  . Albuterol Other (See Comments)    Caused extreme tachycardia. If used by continuous infusion.  May use the inhaler    Patient Measurements: Height: 5\' 10"  (177.8 cm) Weight: 160 lb (72.6 kg) IBW/kg (Calculated) : 73  Vital Signs: Temp: 98 F (36.7 C) (03/28 0454) Temp Source: Oral (03/28 0454) BP: 120/64 (03/28 0454) Pulse Rate: 80 (03/28 0454)  Labs: Recent Labs    12/08/17 0058 12/08/17 0329 12/08/17 0620 12/08/17 1013 12/09/17 0318 12/10/17 0403  HGB  --   --  13.0  --  12.1* 11.9*  HCT  --   --  39.3  --  35.7* 35.4*  PLT  --   --  218  --  215 206  CREATININE  --   --  1.09  --  0.95 0.86  TROPONINI <0.03 <0.03  --  <0.03  --   --     Estimated Creatinine Clearance: 75 mL/min (by C-G formula based on SCr of 0.86 mg/dL).   Medical History: Past Medical History:  Diagnosis Date  . AAA (abdominal aortic aneurysm) ([place])   . COPD with emphysema ([place])    Dr. Joya Gaskins, Gold stage C  . DJD (degenerative joint disease), lumbar   . Emphysema of lung ([place])   . History of prostate cancer    Status post radioactive seed implants  . History of shingles   . Incomplete emptying of bladder   . Mixed hyperlipidemia   . PAF (paroxysmal atrial fibrillation) ([place])   . Palpitations   . Peripheral neuropathy   . Sciatic nerve pain    Secondary to shingles 2011  . Urethral stricture   . Wears partial dentures    Upper and lower    Medications:  Medications Prior to Admission  Medication Sig Dispense Refill Last Dose  . budesonide-formoterol (SYMBICORT) 160-4.5 MCG/ACT inhaler Inhale 2 puffs into the lungs 2 (two) times daily. 1 Inhaler 5 12/07/2017 at Unknown time  . diltiazem (DILT-XR) 120 MG 24 hr capsule TAKE 2 CAPSULES BY MOUTH ONCE DAILY IN THE MORNING AND 1 CAPSULE ONCE DAILY IN THE EVENING (Patient taking differently: Take 120 mg by mouth See admin  instructions. TAKE 2 CAPSULES BY MOUTH ONCE DAILY IN THE MORNING AND 1 CAPSULE ONCE DAILY IN THE EVENING) 270 capsule 1 12/07/2017 at Unknown time  . ipratropium (ATROVENT) 0.02 % nebulizer solution Take 2.5 mLs (0.5 mg total) by nebulization every 6 (six) hours as needed for wheezing or shortness of breath. 120 mL 1 12/07/2017 at Unknown time  . LYRICA 150 MG capsule TAKE ONE CAPSULE BY MOUTH THREE TIMES DAILY 90 capsule 0 12/07/2017 at Unknown time  . Oxycodone HCl 10 MG TABS Take 10 mg by mouth every 6 (six) hours as needed (pain).    12/06/2017 at Unknown time  . predniSONE (DELTASONE) 10 MG tablet Take one a day for 2 weeks then go to 1/2 tab a day until you return 30 tablet 0 12/06/2017 at Unknown time  . SPIRIVA RESPIMAT 2.5 MCG/ACT AERS INHALE TWO PUFFS BY MOUTH ONCE DAILY 3 Inhaler 3 12/07/2017 at Unknown time  . tamsulosin (FLOMAX) 0.4 MG CAPS capsule Take 1 capsule (0.4 mg total) by mouth daily. 30 capsule 0 12/06/2017 at Unknown time  . Misc. Devices (ACAPELLA) MISC Use as directed 1 each 0 Taking  . Spacer/Aero-Holding Chambers (AEROCHAMBER MV) inhaler Use as instructed 1 each 0 Taking  Scheduled:  . apixaban  5 mg Oral BID  . diltiazem  120 mg Oral QHS  . diltiazem  240 mg Oral Daily  . feeding supplement (ENSURE ENLIVE)  237 mL Oral BID BM  . fluticasone furoate-vilanterol  1 puff Inhalation Daily  . guaiFENesin  600 mg Oral BID  . levofloxacin  750 mg Oral Daily  . predniSONE  40 mg Oral Q breakfast  . pregabalin  150 mg Oral TID  . tamsulosin  0.4 mg Oral Daily  . tiotropium  18 mcg Inhalation QHS   Infusions:   PRN: acetaminophen **OR** acetaminophen, albuterol, HYDROcodone-acetaminophen, ipratropium, metoprolol tartrate, ondansetron **OR** ondansetron (ZOFRAN) IV, oxyCODONE, zolpidem  Assessment: 49 yoM with PMH PAF on no chronic AC, dCHF, COPD on 4L O2, , HLD, AAA, neuropathic pain, BPH, presenting 3/25 for sepsis d/t HCAP. Patient had opted to forgo full-dose  anticoagulation for his Afib. Had previously been on QOD low-dose aspirin but had stopped taking in recent months. TRH has consulted Pharmacy to begin Eliquis prior to discharge today.  Today, 12/10/2017:  No bleeding noted, although patient reports bruising easily  SCr < 1.5; Wt < 60 kg, Age < 80y  Hgb slightly low but stable; Plt wnl  Currently on PPx enoxaparin; last dose 10p yesterday   Goal of Therapy:  Prevention of thromboembolic event   Plan:   Stop enoxaparin  Eliquis 5 mg PO bid  F/u SCr as appropriate  Monitor for signs of bleeding or stroke  Pharmacy to provide education prior to discharge  Reuel Boom, PharmD, BCPS [phone] 12/10/2017, 1:32 PM

## 2017-12-10 NOTE — Discharge Summary (Signed)
Physician Discharge Summary  [name] [name] OVF:[id number] DOB: 1941/02/25 DOA: 12/07/2017  PCP: [name], MD  Admit date: 12/07/2017 Discharge date: 12/10/2017  Admitted From: Home Disposition:  Home  Recommendations for Outpatient Follow-up:  1. Follow up with PCP in 1 week 2. Follow up with [facility] regarding urinary retention. Foley catheter removed and was able to void spontaneously prior to discharge. 3. Please obtain BMP/CBC for follow up on potassium level (hypokalemia replaced oral supplemented prior to discharge) and leukocytosis (should resolve once off steroids).   Discharge Condition: Stable CODE STATUS: Full code Diet recommendation: Heart healthy  Brief/Interim Summary: [name] [name] is a 77 year old with past medical history relevant for paroxysmal atrial fibrillation, chronic diastolic heart failure by echo on 11/26/2017, Gold stage III COPD by PFTs on 05/21/2016 (FEV1 of 46%) on4liters of home oxygen at night, history of bladder neck contracture, hyperlipidemia, abdominal aortic aneurysm unknown size, hyperlipidemia, neuropathic pain, BPH who presented to his pulmonologist on 12/07/2017 and was noted to be febrile, lethargic, hypoxic and hypotensive. Patient was admitted for sepsis secondary to HCAP.   She was treated with broad-spectrum antibiotics which was slowly de-escalated.  Oxygen was weaned off to room air.  He worked with physical therapy who had no further recommendations.  Due to acute urinary retention, Foley catheter was placed during hospitalization.  Patient has followed [facility] in the past.  Foley catheter was removed on day of discharge with spontaneous voiding.  He was discharged home on oral Levaquin to finish total 7-day course of antibiotics.  He should have repeat labs as outpatient and follow-up with his primary care physician.  Discharge Diagnoses:  Principal Problem:   HCAP (healthcare-associated pneumonia) Active  Problems:   ATRIAL FIBRILLATION, PAROXYSMAL   Bladder neck contracture   Mixed hyperlipidemia   COPD, severe (HCC)   Leukocytosis   Sepsis ([place])   Malnutrition of moderate degree   Sepsis secondary to HCAP -CXR: Patchy and linear right basilar opacity, which may reflect atelectasis and/or infiltrate. -MRSA PCR negative, discontinue vanco -Influenza negative -Strep pneumo negative  -Sputum culture with normal respiratory flora -Blood culture negative to date  -De-escalate antibiotics to Levaquin at time of discharge  Acute on chronic hypoxemic respiratory failure  -Wears 4 L at nighttime, none during the day -Currently on room air  COPD exacerbation -IV Solu-Medrol transition to oral prednisone 40 mg daily, taper provided at discharge -Monitor leukocytosis while on steroids   Paroxysmal A fib -CHADSVASC 2  -Per PCP office notes in Dec 2018, patient had not wanted to pursue anticoagulation.  He had been recommended at least baby aspirin. He states that he has been on every other day aspirin due to bruising but has not been on aspirin in the recent months. -Continue diltiazem -Discussed with patient of benefit of stroke prevention versus risk of bleeding on blood thinner.  He was willing to try anticoagulation.  We discussed options such as Coumadin, Xarelto, Eliquis.  He has agreed to start Eliquis which will be provided at time of discharge.  Discussed with him that he should stop aspirin.  Follow with his primary care physician. -Normal sinus rhythm this morning  Bladder neck contracture -Follows with Dr. Risa Grill [facility] -Daily Flomax -Due to urinary retention, foley catheter was placed.   Foley removed this morning, voiding spontaneously now.  History AAA -Stable  Chronic pain -Continue pregabalin, oxycodone    Discharge Instructions  Discharge Instructions    Call MD for:  difficulty breathing,  headache or visual disturbances   Complete by:  As  directed    Call MD for:  extreme fatigue   Complete by:  As directed    Call MD for:  hives   Complete by:  As directed    Call MD for:  persistant dizziness or light-headedness   Complete by:  As directed    Call MD for:  persistant nausea and vomiting   Complete by:  As directed    Call MD for:  severe uncontrolled pain   Complete by:  As directed    Call MD for:  temperature >100.4   Complete by:  As directed    Diet - low sodium heart healthy   Complete by:  As directed    Discharge instructions   Complete by:  As directed    You were cared for by a hospitalist during your hospital stay. If you have any questions about your discharge medications or the care you received while you were in the hospital after you are discharged, you can call the unit and asked to speak with the hospitalist on call if the hospitalist that took care of you is not available. Once you are discharged, your primary care physician will handle any further medical issues. Please note that NO REFILLS for any discharge medications will be authorized once you are discharged, as it is imperative that you return to your primary care physician (or establish a relationship with a primary care physician if you do not have one) for your aftercare needs so that they can reassess your need for medications and monitor your lab values.   Increase activity slowly   Complete by:  As directed      Allergies as of 12/10/2017      Reactions   Albuterol Other (See Comments)   Caused extreme tachycardia. If used by continuous infusion.  May use the inhaler      Medication List    TAKE these medications   ACAPELLA Misc Use as directed   AEROCHAMBER MV inhaler Use as instructed   apixaban 5 MG Tabs tablet Commonly known as:  ELIQUIS Take 1 tablet (5 mg total) by mouth 2 (two) times daily.   budesonide-formoterol 160-4.5 MCG/ACT inhaler Commonly known as:  SYMBICORT Inhale 2 puffs into the lungs 2 (two) times daily.    diltiazem 120 MG 24 hr capsule Commonly known as:  DILT-XR TAKE 2 CAPSULES BY MOUTH ONCE DAILY IN THE MORNING AND 1 CAPSULE ONCE DAILY IN THE EVENING What changed:    how much to take  how to take this  when to take this  additional instructions   ipratropium 0.02 % nebulizer solution Commonly known as:  ATROVENT Take 2.5 mLs (0.5 mg total) by nebulization every 6 (six) hours as needed for wheezing or shortness of breath.   levofloxacin 750 MG tablet Commonly known as:  LEVAQUIN Take 1 tablet (750 mg total) by mouth daily for 3 days.   LYRICA 150 MG capsule Generic drug:  pregabalin TAKE ONE CAPSULE BY MOUTH THREE TIMES DAILY   Oxycodone HCl 10 MG Tabs Take 10 mg by mouth every 6 (six) hours as needed (pain).   predniSONE 10 MG tablet Commonly known as:  DELTASONE Take 4 tabs for 3 days, then 3 tabs for 3 days, then 2 tabs for 3 days, then 1 tab for 3 days, then 1/2 tab for 4 days. What changed:  additional instructions   SPIRIVA RESPIMAT 2.5 MCG/ACT Aers Generic drug:  Tiotropium Bromide Monohydrate INHALE TWO PUFFS BY MOUTH ONCE DAILY   tamsulosin 0.4 MG Caps capsule Commonly known as:  FLOMAX Take 1 capsule (0.4 mg total) by mouth daily.      Follow-up Information    [name], MD. Schedule an appointment as soon as possible for a visit in 1 week(s).   Specialty:  Family Medicine Contact information: [address] [place] [address] [phone]        [facility]. Schedule an appointment as soon as possible for a visit in 1 week(s).   Contact information: [place] [phone]         Allergies  Allergen Reactions  . Albuterol Other (See Comments)    Caused extreme tachycardia. If used by continuous infusion.  May use the inhaler    Consultations:  None   Procedures/Studies: Dg Chest 2 View  Result Date: 12/07/2017 CLINICAL DATA:  Initial evaluation for  acute fever, shortness of breath. EXAM: CHEST - 2 VIEW COMPARISON:  Prior CT from 11/26/2017. FINDINGS: Cardiac and mediastinal silhouettes are stable in size and contour, and remain within normal limits. Aortic atherosclerosis. Lungs are hyperinflated with changes related COPD. Calcified granuloma projects over the left hemidiaphragm, stable. Patchy and linear right basilar opacity, which may reflect atelectasis and/or infiltrate. No pulmonary edema or pleural effusion. No pneumothorax. No acute osseus abnormality. IMPRESSION: 1. Patchy and linear right basilar opacity, which may reflect atelectasis and/or infiltrate. 2. Underlying COPD. 3. Aortic atherosclerosis. Electronically Signed   By: Jeannine Boga M.D.   On: 12/07/2017 18:13      Discharge Exam: Vitals:   12/10/17 0454 12/10/17 0840  BP: 120/64   Pulse: 80   Resp: 20   Temp: 98 F (36.7 C)   SpO2: 96% 96%    General: Pt is alert, awake, not in acute distress Cardiovascular: RRR, S1/S2 +, no rubs, no gallops Respiratory: CTA bilaterally, no wheezing, no rhonchi Abdominal: Soft, NT, ND, bowel sounds + Extremities: no edema, no cyanosis    The results of significant diagnostics from this hospitalization (including imaging, microbiology, ancillary and laboratory) are listed below for reference.     Microbiology: Recent Results (from the past 240 hour(s))  Blood Culture (routine x 2)     Status: None (Preliminary result)   Collection Time: 12/07/17  5:30 PM  Result Value Ref Range Status   Specimen Description   Final    BLOOD LEFT ANTECUBITAL Performed at [place] [address], [place], [place] [address]    Special Requests   Final    BOTTLES DRAWN AEROBIC AND ANAEROBIC Blood Culture results may not be optimal due to an excessive volume of blood received in culture bottles Performed at [place] [address] [place], [place] [address]    Culture   Final    NO  GROWTH 3 DAYS Performed at [facility], [place] [address] [place], [place] [address]    Report Status PENDING  Incomplete  Blood Culture (routine x 2)     Status: None (Preliminary result)   Collection Time: 12/07/17  5:41 PM  Result Value Ref Range Status   Specimen Description   Final    BLOOD RIGHT FOREARM Performed at [facility] [address], [place], [place] [address]    Special Requests   Final    BOTTLES DRAWN AEROBIC AND ANAEROBIC Blood Culture results may  not be optimal due to an excessive volume of blood received in culture bottles Performed at [place] [address] [place], [place] [address]    Culture   Final    NO GROWTH 3 DAYS Performed at [facility], [place] [address] [place], [place] [address]    Report Status PENDING  Incomplete  Culture, sputum-assessment     Status: None   Collection Time: 12/08/17 10:13 AM  Result Value Ref Range Status   Specimen Description SPUTUM  Final   Special Requests NONE  Final   Sputum evaluation   Final    THIS SPECIMEN IS ACCEPTABLE FOR SPUTUM CULTURE Performed at [facility], [place] [address] [place], [place] [address]    Report Status 12/08/2017 FINAL  Final  Culture, respiratory (NON-Expectorated)     Status: None   Collection Time: 12/08/17 10:13 AM  Result Value Ref Range Status   Specimen Description   Final    SPUTUM Performed at [place] [address], [place], [place] [address]    Special Requests   Final    NONE Reflexed from [id number] Performed at [facility], [place] [address] [place], [place] [address]    Gram Stain   Final    RARE WBC PRESENT, PREDOMINANTLY PMN FEW SQUAMOUS EPITHELIAL CELLS PRESENT FEW GRAM POSITIVE COCCI    Culture   Final    Consistent with normal respiratory flora. Performed at [facility], [place] [address] [place], [place] [address]    Report Status 12/10/2017  FINAL  Final  MRSA PCR Screening     Status: None   Collection Time: 12/08/17  3:36 PM  Result Value Ref Range Status   MRSA by PCR NEGATIVE NEGATIVE Final    Comment:        The GeneXpert MRSA Assay (FDA approved for NASAL specimens only), is one component of a comprehensive MRSA colonization surveillance program. It is not intended to diagnose MRSA infection nor to guide or monitor treatment for MRSA infections. Performed at [facility], [place] [address], [place], [place] [address]      Labs: BNP (last 3 results) Recent Labs    08/12/17 1355 11/18/17 1930 11/25/17 2031  BNP 29.0 40.0 77.9   Basic Metabolic Panel: Recent Labs  Lab 12/07/17 1719 12/08/17 0620 12/09/17 0318 12/10/17 0403  NA 140 140 142 146*  K 4.1 4.1 3.5 3.4*  CL 102 108 109 113*  CO2 27 26 24 26   GLUCOSE 141* 190* 198* 147*  BUN 23* 21* 33* 40*  CREATININE 1.55* 1.09 0.95 0.86  CALCIUM 8.4* 8.0* 8.7* 8.6*  MG  --   --  2.2  --    Liver Function Tests: Recent Labs  Lab 12/07/17 1719  AST 20  ALT 23  ALKPHOS 64  BILITOT 1.1  PROT 5.6*  ALBUMIN 2.6*   No results for input(s): LIPASE, AMYLASE in the last 168 hours. No results for input(s): AMMONIA in the last 168 hours. CBC: Recent Labs  Lab 12/07/17 1719 12/08/17 0620 12/09/17 0318 12/10/17 0403  WBC 25.4* 15.7* 17.1* 17.1*  NEUTROABS 20.6*  --   --   --   HGB 13.6 13.0 12.1* 11.9*  HCT 40.9 39.3 35.7* 35.4*  MCV 92.1 93.1 89.5 89.4  PLT 283 218 215 206   Cardiac Enzymes: Recent Labs  Lab 12/08/17 0058 12/08/17 0329 12/08/17 1013  TROPONINI <0.03 <0.03 <0.03   BNP:  Invalid input(s): POCBNP CBG: No results for input(s): GLUCAP in the last 168 hours. D-Dimer No results for input(s): DDIMER in the last 72 hours. Hgb A1c No results for input(s): HGBA1C in the last 72 hours. Lipid Profile No results for input(s): CHOL, HDL, LDLCALC, TRIG, CHOLHDL, LDLDIRECT in the last 72 hours. Thyroid function  studies No results for input(s): TSH, T4TOTAL, T3FREE, THYROIDAB in the last 72 hours.  Invalid input(s): FREET3 Anemia work up No results for input(s): VITAMINB12, FOLATE, FERRITIN, TIBC, IRON, RETICCTPCT in the last 72 hours. Urinalysis    Component Value Date/Time   COLORURINE YELLOW 12/07/2017 1719   APPEARANCEUR CLEAR 12/07/2017 1719   LABSPEC 1.004 (L) 12/07/2017 1719   PHURINE 7.0 12/07/2017 1719   GLUCOSEU NEGATIVE 12/07/2017 1719   HGBUR MODERATE (A) 12/07/2017 1719   BILIRUBINUR NEGATIVE 12/07/2017 1719   KETONESUR NEGATIVE 12/07/2017 1719   PROTEINUR NEGATIVE 12/07/2017 1719   NITRITE NEGATIVE 12/07/2017 1719   LEUKOCYTESUR LARGE (A) 12/07/2017 1719   Sepsis Labs Invalid input(s): PROCALCITONIN,  WBC,  LACTICIDVEN Microbiology Recent Results (from the past 240 hour(s))  Blood Culture (routine x 2)     Status: None (Preliminary result)   Collection Time: 12/07/17  5:30 PM  Result Value Ref Range Status   Specimen Description   Final    BLOOD LEFT ANTECUBITAL Performed at [facility], [place] [address] [place], [place] [address]    Special Requests   Final    BOTTLES DRAWN AEROBIC AND ANAEROBIC Blood Culture results may not be optimal due to an excessive volume of blood received in culture bottles Performed at [place] [address] [place], [place] [address]    Culture   Final    NO GROWTH 3 DAYS Performed at [facility], [place] [address] [place], [place] [address]    Report Status PENDING  Incomplete  Blood Culture (routine x 2)     Status: None (Preliminary result)   Collection Time: 12/07/17  5:41 PM  Result Value Ref Range Status   Specimen Description   Final    BLOOD RIGHT FOREARM Performed at [facility] [address] [place], [place] [address]    Special Requests   Final    BOTTLES DRAWN AEROBIC AND ANAEROBIC Blood Culture results may not be optimal due to an excessive volume of  blood received in culture bottles Performed at [facility] [address] [place], [place] [address]    Culture   Final    NO GROWTH 3 DAYS Performed at [facility], [place] [address], [place], [place] [address]    Report Status PENDING  Incomplete  Culture, sputum-assessment     Status: None   Collection Time: 12/08/17 10:13 AM  Result Value Ref Range Status   Specimen Description SPUTUM  Final   Special Requests NONE  Final   Sputum evaluation   Final    THIS SPECIMEN IS ACCEPTABLE FOR SPUTUM CULTURE Performed at [facility], [place] [address] [place], [place] [address]    Report Status 12/08/2017 FINAL  Final  Culture, respiratory (NON-Expectorated)     Status: None   Collection Time: 12/08/17 10:13 AM  Result Value Ref Range Status   Specimen Description   Final    SPUTUM Performed at [facility] [address] [place], [place] [address]    Special Requests   Final    NONE Reflexed from [id number] Performed at  [facility], [place] [address], [place], [place] [address]    Gram Stain   Final    RARE WBC PRESENT, PREDOMINANTLY PMN FEW SQUAMOUS EPITHELIAL CELLS PRESENT FEW GRAM POSITIVE COCCI    Culture   Final    Consistent with normal respiratory flora. Performed at [facility], [place] [address] [place], [place] [address]    Report Status 12/10/2017 FINAL  Final  MRSA PCR Screening     Status: None   Collection Time: 12/08/17  3:36 PM  Result Value Ref Range Status   MRSA by PCR NEGATIVE NEGATIVE Final    Comment:        The GeneXpert MRSA Assay (FDA approved for NASAL specimens only), is one component of a comprehensive MRSA colonization surveillance program. It is not intended to diagnose MRSA infection nor to guide or monitor treatment for MRSA infections. Performed at [facility], [place] [address] [place], [place] [address]      Patient was seen and  examined on the day of discharge and was found to be in stable condition. Time coordinating discharge: 25 minutes including assessment and coordination of care, as well as examination of the patient.   SIGNED:  Dessa Phi, DO [facility] Pager [phone]  If 7PM-7AM, please contact night-coverage [email or web address] Password [email or web address] 12/10/2017, 2:17 PM

## 2017-12-11 ENCOUNTER — Telehealth: Payer: Self-pay

## 2017-12-11 NOTE — Telephone Encounter (Signed)
Transition Care Management Follow-up Telephone Call   Date discharged?  [id number]           How have you been since you were released from the hospital? Doing well. Trying to do what they told him.    Do you understand why you were in the hospital? Yes. Pneumonia   Do you understand the discharge instructions? yes   Where were you discharged to? Home   Items Reviewed:  Medications reviewed: yes  Allergies reviewed: yes  Dietary changes reviewed: yes. No changes  Referrals reviewed: yes   Functional Questionnaire:   Activities of Daily Living (ADLs): yes. Wife is helping to complete ADL's    Any transportation issues/concerns?: no   Any patient concerns? no   Confirmed importance and date/time of follow-up visits scheduled yes. appt for 12/15/2017 at 1:40 pm     Confirmed with patient if condition begins to worsen call PCP or go to the ER.  Patient was given the office number and encouraged to call back with question or concerns.  : yes

## 2017-12-12 LAB — CULTURE, BLOOD (ROUTINE X 2)
CULTURE: NO GROWTH
Culture: NO GROWTH

## 2017-12-14 ENCOUNTER — Encounter: Payer: Self-pay | Admitting: Family Medicine

## 2017-12-14 ENCOUNTER — Inpatient Hospital Stay (HOSPITAL_COMMUNITY)
Admission: EM | Admit: 2017-12-14 | Discharge: 2017-12-17 | DRG: 640 | Disposition: A | Discharge status: Home Health | Payer: Medicare Other | Attending: Internal Medicine | Admitting: Internal Medicine

## 2017-12-14 ENCOUNTER — Other Ambulatory Visit: Payer: Self-pay

## 2017-12-14 ENCOUNTER — Inpatient Hospital Stay (HOSPITAL_COMMUNITY): Payer: Medicare Other

## 2017-12-14 ENCOUNTER — Encounter (HOSPITAL_COMMUNITY): Payer: Self-pay | Admitting: *Deleted

## 2017-12-14 ENCOUNTER — Emergency Department (HOSPITAL_COMMUNITY): Payer: Medicare Other

## 2017-12-14 ENCOUNTER — Encounter (HOSPITAL_COMMUNITY): Payer: Self-pay

## 2017-12-14 ENCOUNTER — Ambulatory Visit (INDEPENDENT_AMBULATORY_CARE_PROVIDER_SITE_OTHER): Payer: Medicare Other | Admitting: Family Medicine

## 2017-12-14 VITALS — BP 86/58 | HR 112 | Temp 98.4°F | Resp 24 | Ht 70.0 in | Wt 160.0 lb

## 2017-12-14 DIAGNOSIS — N35919 Unspecified urethral stricture, male, unspecified site: Secondary | ICD-10-CM | POA: Diagnosis present

## 2017-12-14 DIAGNOSIS — R531 Weakness: Secondary | ICD-10-CM | POA: Diagnosis not present

## 2017-12-14 DIAGNOSIS — E782 Mixed hyperlipidemia: Secondary | ICD-10-CM | POA: Diagnosis present

## 2017-12-14 DIAGNOSIS — I714 Abdominal aortic aneurysm, without rupture, unspecified: Secondary | ICD-10-CM | POA: Diagnosis present

## 2017-12-14 DIAGNOSIS — R339 Retention of urine, unspecified: Secondary | ICD-10-CM | POA: Diagnosis present

## 2017-12-14 DIAGNOSIS — Z9981 Dependence on supplemental oxygen: Secondary | ICD-10-CM | POA: Diagnosis not present

## 2017-12-14 DIAGNOSIS — R5381 Other malaise: Secondary | ICD-10-CM | POA: Diagnosis present

## 2017-12-14 DIAGNOSIS — T380X6A Underdosing of glucocorticoids and synthetic analogues, initial encounter: Secondary | ICD-10-CM | POA: Diagnosis present

## 2017-12-14 DIAGNOSIS — Z87891 Personal history of nicotine dependence: Secondary | ICD-10-CM

## 2017-12-14 DIAGNOSIS — J9610 Chronic respiratory failure, unspecified whether with hypoxia or hypercapnia: Secondary | ICD-10-CM

## 2017-12-14 DIAGNOSIS — Z8701 Personal history of pneumonia (recurrent): Secondary | ICD-10-CM | POA: Diagnosis not present

## 2017-12-14 DIAGNOSIS — R6510 Systemic inflammatory response syndrome (SIRS) of non-infectious origin without acute organ dysfunction: Secondary | ICD-10-CM | POA: Diagnosis present

## 2017-12-14 DIAGNOSIS — I4891 Unspecified atrial fibrillation: Secondary | ICD-10-CM | POA: Diagnosis present

## 2017-12-14 DIAGNOSIS — R000 Tachycardia, unspecified: Secondary | ICD-10-CM

## 2017-12-14 DIAGNOSIS — A419 Sepsis, unspecified organism: Secondary | ICD-10-CM | POA: Diagnosis not present

## 2017-12-14 DIAGNOSIS — N320 Bladder-neck obstruction: Secondary | ICD-10-CM | POA: Diagnosis present

## 2017-12-14 DIAGNOSIS — I959 Hypotension, unspecified: Secondary | ICD-10-CM

## 2017-12-14 DIAGNOSIS — J441 Chronic obstructive pulmonary disease with (acute) exacerbation: Secondary | ICD-10-CM | POA: Diagnosis not present

## 2017-12-14 DIAGNOSIS — J9621 Acute and chronic respiratory failure with hypoxia: Secondary | ICD-10-CM | POA: Diagnosis present

## 2017-12-14 DIAGNOSIS — M47816 Spondylosis without myelopathy or radiculopathy, lumbar region: Secondary | ICD-10-CM | POA: Diagnosis present

## 2017-12-14 DIAGNOSIS — Z7951 Long term (current) use of inhaled steroids: Secondary | ICD-10-CM

## 2017-12-14 DIAGNOSIS — J439 Emphysema, unspecified: Secondary | ICD-10-CM | POA: Diagnosis present

## 2017-12-14 DIAGNOSIS — Z888 Allergy status to other drugs, medicaments and biological substances status: Secondary | ICD-10-CM

## 2017-12-14 DIAGNOSIS — Z7901 Long term (current) use of anticoagulants: Secondary | ICD-10-CM

## 2017-12-14 DIAGNOSIS — Z91128 Patient's intentional underdosing of medication regimen for other reason: Secondary | ICD-10-CM

## 2017-12-14 DIAGNOSIS — Z8546 Personal history of malignant neoplasm of prostate: Secondary | ICD-10-CM

## 2017-12-14 DIAGNOSIS — J449 Chronic obstructive pulmonary disease, unspecified: Secondary | ICD-10-CM

## 2017-12-14 DIAGNOSIS — E0590 Thyrotoxicosis, unspecified without thyrotoxic crisis or storm: Secondary | ICD-10-CM | POA: Diagnosis present

## 2017-12-14 DIAGNOSIS — Z6822 Body mass index (BMI) 22.0-22.9, adult: Secondary | ICD-10-CM

## 2017-12-14 DIAGNOSIS — Z825 Family history of asthma and other chronic lower respiratory diseases: Secondary | ICD-10-CM

## 2017-12-14 DIAGNOSIS — R7989 Other specified abnormal findings of blood chemistry: Secondary | ICD-10-CM

## 2017-12-14 DIAGNOSIS — E860 Dehydration: Secondary | ICD-10-CM | POA: Diagnosis present

## 2017-12-14 DIAGNOSIS — E2740 Unspecified adrenocortical insufficiency: Secondary | ICD-10-CM | POA: Diagnosis present

## 2017-12-14 DIAGNOSIS — I480 Paroxysmal atrial fibrillation: Secondary | ICD-10-CM | POA: Diagnosis not present

## 2017-12-14 DIAGNOSIS — G629 Polyneuropathy, unspecified: Secondary | ICD-10-CM | POA: Diagnosis present

## 2017-12-14 DIAGNOSIS — I10 Essential (primary) hypertension: Secondary | ICD-10-CM | POA: Diagnosis present

## 2017-12-14 DIAGNOSIS — E440 Moderate protein-calorie malnutrition: Secondary | ICD-10-CM | POA: Diagnosis present

## 2017-12-14 DIAGNOSIS — Z8249 Family history of ischemic heart disease and other diseases of the circulatory system: Secondary | ICD-10-CM

## 2017-12-14 LAB — INFLUENZA PANEL BY PCR (TYPE A & B)
Influenza A By PCR: NEGATIVE
Influenza B By PCR: NEGATIVE

## 2017-12-14 LAB — COMPREHENSIVE METABOLIC PANEL
ALT: 29 U/L (ref 17–63)
AST: 20 U/L (ref 15–41)
Albumin: 3.2 g/dL — ABNORMAL LOW (ref 3.5–5.0)
Alkaline Phosphatase: 60 U/L (ref 38–126)
Anion gap: 17 — ABNORMAL HIGH (ref 5–15)
BUN: 26 mg/dL — ABNORMAL HIGH (ref 6–20)
CO2: 21 mmol/L — ABNORMAL LOW (ref 22–32)
Calcium: 8.8 mg/dL — ABNORMAL LOW (ref 8.9–10.3)
Chloride: 100 mmol/L — ABNORMAL LOW (ref 101–111)
Creatinine, Ser: 1.21 mg/dL (ref 0.61–1.24)
GFR calc Af Amer: >60 mL/min (ref >60)
GFR calc non Af Amer: 56 mL/min — ABNORMAL LOW (ref >60)
Glucose, Bld: 107 mg/dL — ABNORMAL HIGH (ref 65–99)
Potassium: 4 mmol/L (ref 3.5–5.1)
Sodium: 138 mmol/L (ref 135–145)
Total Bilirubin: 2.4 mg/dL — ABNORMAL HIGH (ref 0.3–1.2)
Total Protein: 7.3 g/dL (ref 6.5–8.1)

## 2017-12-14 LAB — URINALYSIS, ROUTINE W REFLEX MICROSCOPIC
Bacteria, UA: NONE SEEN
Bilirubin Urine: NEGATIVE
Glucose, UA: NEGATIVE mg/dL
Ketones, ur: 20 mg/dL — AB
Leukocytes, UA: NEGATIVE
Nitrite: NEGATIVE
Protein, ur: NEGATIVE mg/dL
Specific Gravity, Urine: 1.009 (ref 1.005–1.030)
Squamous Epithelial / LPF: NONE SEEN
pH: 6 (ref 5.0–8.0)

## 2017-12-14 LAB — CBC WITH DIFFERENTIAL/PLATELET
Basophils Absolute: 0 10*3/uL (ref 0.0–0.1)
Basophils Relative: 0 %
Eosinophils Absolute: 0.1 10*3/uL (ref 0.0–0.7)
Eosinophils Relative: 1 %
HCT: 45.2 % (ref 39.0–52.0)
Hemoglobin: 14.9 g/dL (ref 13.0–17.0)
Lymphocytes Relative: 6 %
Lymphs Abs: 1.3 10*3/uL (ref 0.7–4.0)
MCH: 29.9 pg (ref 26.0–34.0)
MCHC: 33 g/dL (ref 30.0–36.0)
MCV: 90.6 fL (ref 78.0–100.0)
Monocytes Absolute: 0.9 10*3/uL (ref 0.1–1.0)
Monocytes Relative: 5 %
Neutro Abs: 17.5 10*3/uL — ABNORMAL HIGH (ref 1.7–7.7)
Neutrophils Relative %: 88 %
Platelets: 183 10*3/uL (ref 150–400)
RBC: 4.99 MIL/uL (ref 4.22–5.81)
RDW: 14.9 % (ref 11.5–15.5)
WBC: 19.8 10*3/uL — ABNORMAL HIGH (ref 4.0–10.5)

## 2017-12-14 LAB — TROPONIN I: Troponin I: <0.03 ng/mL (ref <0.03)

## 2017-12-14 LAB — LACTIC ACID, PLASMA: Lactic Acid, Venous: 3.5 mmol/L (ref 0.5–1.9)

## 2017-12-14 MED ORDER — IPRATROPIUM-ALBUTEROL 0.5-2.5 (3) MG/3ML IN SOLN
3.0000 mL | Freq: Once | RESPIRATORY_TRACT | Status: AC
  Administered 2017-12-14: 3 mL via RESPIRATORY_TRACT
  Filled 2017-12-14: qty 3

## 2017-12-14 MED ORDER — SODIUM CHLORIDE 0.9 % IV SOLN
INTRAVENOUS | Status: AC
  Administered 2017-12-14: 18:00:00 via INTRAVENOUS

## 2017-12-14 MED ORDER — PIPERACILLIN-TAZOBACTAM 3.375 G IVPB 30 MIN
3.3750 g | Freq: Once | INTRAVENOUS | Status: AC
  Administered 2017-12-14: 3.375 g via INTRAVENOUS
  Filled 2017-12-14: qty 50

## 2017-12-14 MED ORDER — ENSURE ENLIVE PO LIQD
237.0000 mL | Freq: Two times a day (BID) | ORAL | Status: DC
  Administered 2017-12-15 – 2017-12-16 (×3): 237 mL via ORAL

## 2017-12-14 MED ORDER — PREGABALIN 75 MG PO CAPS
150.0000 mg | ORAL_CAPSULE | Freq: Three times a day (TID) | ORAL | Status: DC
  Administered 2017-12-14 – 2017-12-17 (×8): 150 mg via ORAL
  Filled 2017-12-14 (×9): qty 2

## 2017-12-14 MED ORDER — SODIUM CHLORIDE 0.9 % IV BOLUS
2000.0000 mL | Freq: Once | INTRAVENOUS | Status: AC
  Administered 2017-12-14: 2000 mL via INTRAVENOUS

## 2017-12-14 MED ORDER — OXYCODONE HCL 5 MG PO TABS: 10.0000 mg | ORAL_TABLET | Freq: Four times a day (QID) | ORAL | Status: DC | PRN

## 2017-12-14 MED ORDER — PIPERACILLIN-TAZOBACTAM 3.375 G IVPB
3.3750 g | Freq: Three times a day (TID) | INTRAVENOUS | Status: DC
  Administered 2017-12-14 – 2017-12-16 (×5): 3.375 g via INTRAVENOUS
  Filled 2017-12-14 (×5): qty 50

## 2017-12-14 MED ORDER — DILTIAZEM HCL ER COATED BEADS 240 MG PO CP24
240.0000 mg | ORAL_CAPSULE | Freq: Every day | ORAL | Status: DC
Start: 2017-12-15 — End: 2017-12-17
  Administered 2017-12-15 – 2017-12-17 (×3): 240 mg via ORAL
  Filled 2017-12-14 (×3): qty 1

## 2017-12-14 MED ORDER — MOMETASONE FURO-FORMOTEROL FUM 200-5 MCG/ACT IN AERO
2.0000 | INHALATION_SPRAY | Freq: Two times a day (BID) | RESPIRATORY_TRACT | Status: DC
  Administered 2017-12-14 – 2017-12-15 (×2): 2 via RESPIRATORY_TRACT
  Filled 2017-12-14 (×2): qty 8.8

## 2017-12-14 MED ORDER — METHYLPREDNISOLONE SODIUM SUCC 125 MG IJ SOLR
80.0000 mg | Freq: Three times a day (TID) | INTRAMUSCULAR | Status: DC
  Administered 2017-12-14 – 2017-12-15 (×3): 80 mg via INTRAVENOUS
  Filled 2017-12-14 (×3): qty 2

## 2017-12-14 MED ORDER — ACETAMINOPHEN 325 MG PO TABS
650.0000 mg | ORAL_TABLET | Freq: Four times a day (QID) | ORAL | Status: DC | PRN
Start: 2017-12-14 — End: 2017-12-17

## 2017-12-14 MED ORDER — ACETAMINOPHEN 650 MG RE SUPP: 650.0000 mg | Freq: Four times a day (QID) | RECTAL | Status: DC | PRN

## 2017-12-14 MED ORDER — DILTIAZEM HCL ER 120 MG PO CP24: 120.0000 mg | ORAL_CAPSULE | ORAL | Status: DC

## 2017-12-14 MED ORDER — VANCOMYCIN HCL IN DEXTROSE 750-5 MG/150ML-% IV SOLN
750.0000 mg | Freq: Two times a day (BID) | INTRAVENOUS | Status: DC
  Administered 2017-12-14 – 2017-12-16 (×4): 750 mg via INTRAVENOUS
  Filled 2017-12-14 (×4): qty 150

## 2017-12-14 MED ORDER — DILTIAZEM HCL ER COATED BEADS 120 MG PO CP24
120.0000 mg | ORAL_CAPSULE | Freq: Every day | ORAL | Status: DC
Start: 2017-12-14 — End: 2017-12-17
  Administered 2017-12-14 – 2017-12-16 (×3): 120 mg via ORAL
  Filled 2017-12-14 (×3): qty 1

## 2017-12-14 MED ORDER — VANCOMYCIN HCL IN DEXTROSE 1-5 GM/200ML-% IV SOLN
1000.0000 mg | Freq: Once | INTRAVENOUS | Status: AC
  Administered 2017-12-14: 1000 mg via INTRAVENOUS
  Filled 2017-12-14: qty 200

## 2017-12-14 MED ORDER — APIXABAN 5 MG PO TABS
5.0000 mg | ORAL_TABLET | Freq: Two times a day (BID) | ORAL | Status: DC
Start: 2017-12-14 — End: 2017-12-17
  Administered 2017-12-14 – 2017-12-17 (×7): 5 mg via ORAL
  Filled 2017-12-14 (×11): qty 1

## 2017-12-14 MED ORDER — TIOTROPIUM BROMIDE MONOHYDRATE 18 MCG IN CAPS
18.0000 ug | ORAL_CAPSULE | Freq: Every day | RESPIRATORY_TRACT | Status: DC
  Filled 2017-12-14: qty 5

## 2017-12-14 NOTE — ED Notes (Signed)
Gave EKG to Dr. Wilson Singer

## 2017-12-14 NOTE — Progress Notes (Signed)
ANTICOAGULATION CONSULT NOTE - Initial Consult  Pharmacy Consult for Eliquis Indication: atrial fibrillation  Allergies  Allergen Reactions  . Albuterol Other (See Comments)    Caused extreme tachycardia. If used by continuous infusion.  May use the inhaler    Patient Measurements: Height: 5\' 10"  (177.8 cm) Weight: 160 lb (72.6 kg) IBW/kg (Calculated) : 73  Vital Signs: Temp: 98 F (36.7 C) (04/01 1249) Temp Source: Oral (04/01 1249) BP: 121/66 (04/01 1600) Pulse Rate: 96 (04/01 1530)  Labs: Recent Labs    12/14/17 1334 12/14/17 1352  HGB  --  14.9  HCT  --  45.2  PLT  --  183  CREATININE  --  1.21  TROPONINI <0.03  --     Estimated Creatinine Clearance: 53.3 mL/min (by C-G formula based on SCr of 1.21 mg/dL).   Medical History: Past Medical History:  Diagnosis Date  . AAA (abdominal aortic aneurysm) ([place])   . COPD with emphysema ([place])    Dr. Joya Gaskins, Gold stage C  . DJD (degenerative joint disease), lumbar   . Emphysema of lung ([place])   . History of prostate cancer    Status post radioactive seed implants  . History of shingles   . Incomplete emptying of bladder   . Mixed hyperlipidemia   . PAF (paroxysmal atrial fibrillation) ([place])   . Palpitations   . Peripheral neuropathy   . Sciatic nerve pain    Secondary to shingles 2011  . Urethral stricture   . Wears partial dentures    Upper and lower    Medications:  See med rec  Assessment: 77 yo male presented with dyspnea and fatigue to ED. Patient is on chronic anticoagulation for afib with eliquis.   Goal of Therapy:   Monitor platelets by anticoagulation protocol: Yes   Plan:  Continue with eliquis 5mg  po bid Monitor for s/s of bleeding  Isac Sarna, BS [facility] [place], BCPS Clinical Pharmacist Pager [phone] 12/14/2017,4:34 PM

## 2017-12-14 NOTE — Patient Instructions (Signed)
GO TO ER °

## 2017-12-14 NOTE — Progress Notes (Signed)
Pharmacy Antibiotic Note  [name] [name] is a 77 y.o. male admitted on 12/14/2017 with sepsis.  Pharmacy has been consulted for Vancomycin and zosyn dosing.  Plan: Vancomycin 1000mg  given in ED, then 750mg   IV every 12 hours.  Goal trough 15-20 mcg/mL. Zosyn 3.375g IV q8h (4 hour infusion).  F/U cxs and clinical progress Monitor V/S, labs, and levels as indicated  Height: 5\' 10"  (177.8 cm) Weight: 160 lb (72.6 kg) IBW/kg (Calculated) : 73  Temp (24hrs), Avg:98.2 F (36.8 C), Min:98 F (36.7 C), Max:98.4 F (36.9 C)  Recent Labs  Lab 12/07/17 1719 12/07/17 1733 12/08/17 0620 12/09/17 0318 12/10/17 0403 12/14/17 1352  WBC 25.4*  --  15.7* 17.1* 17.1* 19.8*  CREATININE 1.55*  --  1.09 0.95 0.86 1.21  LATICACIDVEN  --  1.86  --   --   --  3.5*    Estimated Creatinine Clearance: 53.3 mL/min (by C-G formula based on SCr of 1.21 mg/dL).    Allergies  Allergen Reactions  . Albuterol Other (See Comments)    Caused extreme tachycardia. If used by continuous infusion.  May use the inhaler    Antimicrobials this admission: Vancomycin 4/1 >>   zosyn 4/1 >>   Dose adjustments this admission: f/A  Microbiology results: 4/1 BCx: pending 3/26 MRSA PCR: pending  Thank you for allowing pharmacy to be a part of this patient's care.  Isac Sarna, BS [facility] [place], [place] Clinical Pharmacist Pager [phone] 12/14/2017 4:38 PM

## 2017-12-14 NOTE — ED Triage Notes (Signed)
Pt complaining of SOB. Was discharged from [facility] on Friday after being admitted for pneumonia. Pt is not normally on O2, but wore home O2 today due to being SOB. Pt's sats 95% on RA, but using accessory muscles to breathe. Pursed lip breathing noted.

## 2017-12-14 NOTE — ED Notes (Signed)
Family at bedside. 

## 2017-12-14 NOTE — ED Notes (Signed)
Pt was unable to void and exhibited urinary retention.  In and out done with 760ml of return.

## 2017-12-14 NOTE — ED Notes (Signed)
ED Provider at bedside. 

## 2017-12-14 NOTE — H&P (Signed)
TRH H&P   Patient Demographics:    [name] [name], is a 77 y.o. male  MRN: [id number]   DOB - 1940-10-15  Admit Date - 12/14/2017  Outpatient Primary MD for the patient is [name], MD  Referring MD/NP/PA:  Virgel Manifold  Outpatient Specialists:   Patient coming from: home  Chief Complaint  Patient presents with  . Shortness of Breath      HPI:    [name] [name]  is a 77 y.o. male, w hypertension, hyperlipidemia,  AAA, Pafib, Copd  H/o prostate cancer who apparently presents with c/o Dyspnea, generalized weakness .  Pt denies fever, chills, cough, cp, palp, n/v, diarrhea, brbpr, dysuria.  pt was seen by Dr. Meda Coffee today and noted to be hypotensive and sent to ER for evaluation.   In ED,   CXR IMPRESSION: Mild COPD.  Previously seen right basilar changes have resolved.  Trop negative Wbc 19.8, Hgb 14.9, Plt 183  Na 138, K 4.0, Bun 26, Creatinine 1.21  Pt will be admitted for sepsis, Copd exacerbation   Review of systems:    In addition to the HPI above,  No Fever-chills, No Headache, No changes with Vision or hearing, No problems swallowing food or Liquids, No Chest pain, Cough No Abdominal pain, No Nausea or Vommitting, Bowel movements are regular, No Blood in stool or Urine, No dysuria, No new skin rashes or bruises, No new joints pains-aches,  No new weakness, tingling, numbness in any extremity, No recent weight gain or loss, No polyuria, polydypsia or polyphagia, No significant Mental Stressors.  A full 10 point Review of Systems was done, except as stated above, all other Review of Systems were negative.   With Past History of the following :    Past Medical History:  Diagnosis Date  . AAA (abdominal aortic aneurysm) ([place])   . COPD with emphysema ([place])    Dr. Joya Gaskins, Gold stage C  . DJD (degenerative joint disease), lumbar   . Emphysema  of lung ([place])   . History of prostate cancer    Status post radioactive seed implants  . History of shingles   . Incomplete emptying of bladder   . Mixed hyperlipidemia   . PAF (paroxysmal atrial fibrillation) ([place])   . Palpitations   . Peripheral neuropathy   . Sciatic nerve pain    Secondary to shingles 2011  . Urethral stricture   . Wears partial dentures    Upper and lower      Past Surgical History:  Procedure Laterality Date  . CATARACT EXTRACTION W/ INTRAOCULAR LENS  IMPLANT, BILATERAL  2012  . COLONOSCOPY  02-14-2003  . CYSTO/  BALLOON DILATION AND INCISION BLADDER NECK CONTRACTURE  X5  LAST ONE 01-24-2009  . CYSTOSCOPY WITH URETHRAL DILATATION  09/15/2011   Procedure: CYSTOSCOPY WITH URETHRAL DILATATION;  Surgeon: Bernestine Amass, MD;  Location: [facility];  Service: Urology;  Laterality: N/A;  CYSTOSCOPY, BALLOON DILATION   . CYSTOSCOPY WITH URETHRAL DILATATION N/A 04/03/2014   Procedure: CYSTOSCOPY WITH dilatation;  Surgeon: Bernestine Amass, MD;  Location[facility];  Service: Urology;  Laterality: N/A;  . EYE SURGERY    . RADIOACTIVE PROSTATE SEED IMPLANTS/  CYSTO WITH BALLOON DILATION BLADDER NECK CONTRACTURE  11-26-2006      Social History:     Social History   Tobacco Use  . Smoking status: Former Smoker    Packs/day: 2.00    Years: 40.00    Pack years: 80.00    Types: Cigarettes    Start date: 06/19/1954    Last attempt to quit: 09/15/1998    Years since quitting: 19.2  . Smokeless tobacco: Never Used  . Tobacco comment: Counseled to remain smoke free  Substance Use Topics  . Alcohol use: No    Alcohol/week: 0.0 oz     Lives - at home  Mobility - walks by self   Family History :     Family History  Problem Relation Age of Onset  . COPD Mother   . Heart disease Brother        died at 9      Home Medications:   Prior to Admission medications   Medication Sig Start Date End Date Taking? Authorizing Provider   apixaban (ELIQUIS) 5 MG TABS tablet Take 1 tablet (5 mg total) by mouth 2 (two) times daily. 12/10/17  Yes Dessa Phi, DO  budesonide-formoterol (SYMBICORT) 160-4.5 MCG/ACT inhaler Inhale 2 puffs into the lungs 2 (two) times daily. 11/17/17  Yes Parrett, Tammy S, NP  diltiazem (DILT-XR) 120 MG 24 hr capsule TAKE 2 CAPSULES BY MOUTH ONCE DAILY IN THE MORNING AND 1 CAPSULE ONCE DAILY IN THE EVENING Patient taking differently: Take 120 mg by mouth See admin instructions. TAKE 2 CAPSULES BY MOUTH ONCE DAILY IN THE MORNING AND 1 CAPSULE ONCE DAILY IN THE EVENING 11/06/17  Yes Satira Sark, MD  ipratropium (ATROVENT) 0.02 % nebulizer solution Take 2.5 mLs (0.5 mg total) by nebulization every 6 (six) hours as needed for wheezing or shortness of breath. 11/30/17  Yes Collene Gobble, MD  LYRICA 150 MG capsule TAKE ONE CAPSULE BY MOUTH THREE TIMES DAILY 05/23/14  Yes Marcial Pacas, MD  Oxycodone HCl 10 MG TABS Take 10 mg by mouth every 6 (six) hours as needed (pain).    Yes [provider]  SPIRIVA RESPIMAT 2.5 MCG/ACT AERS INHALE TWO PUFFS BY MOUTH ONCE DAILY 12/19/16  Yes Javier Glazier, MD  Misc. Devices [facility]) MISC Use as directed 10/21/16   Javier Glazier, MD  Spacer/Aero-Holding Chambers (AEROCHAMBER MV) inhaler Use as instructed 10/21/16   Javier Glazier, MD     Allergies:     Allergies  Allergen Reactions  . Albuterol Other (See Comments)    Caused extreme tachycardia. If used by continuous infusion.  May use the inhaler     Physical Exam:   Vitals  Blood pressure 127/66, pulse 96, temperature 98 F (36.7 C), temperature source Oral, resp. rate (!) 21, height 5\' 10"  (1.778 m), weight 72.6 kg (160 lb), SpO2 91 %.   1. General  lying in bed in NAD,    2. Normal affect and insight, Not Suicidal or Homicidal, Awake Alert, Oriented X 3.  3. No F.N deficits, ALL C.Nerves Intact, Strength 5/5 all 4 extremities, Sensation intact all 4 extremities, Plantars down  going.  4. Ears and Eyes appear Normal, Conjunctivae clear, PERRLA. Moist Oral Mucosa.  5. Supple Neck, No JVD, No cervical lymphadenopathy appriciated, No Carotid Bruits.  6. Symmetrical Chest wall movement, Good air movement bilaterally, slight crackle bilateral base, slight bilateral exp wheezing.   7. RRR, No Gallops, Rubs or Murmurs, No Parasternal Heave.  8. Positive Bowel Sounds, Abdomen Soft, No tenderness, No organomegaly appriciated,No rebound -guarding or rigidity.  9.  No Cyanosis, Normal Skin Turgor, No Skin Rash or Bruise.  10. Good muscle tone,  joints appear normal , no effusions, Normal ROM.  11. No Palpable Lymph Nodes in Neck or Axillae     Data Review:    CBC Recent Labs  Lab 12/07/17 1719 12/08/17 0620 12/09/17 0318 12/10/17 0403 12/14/17 1352  WBC 25.4* 15.7* 17.1* 17.1* 19.8*  HGB 13.6 13.0 12.1* 11.9* 14.9  HCT 40.9 39.3 35.7* 35.4* 45.2  PLT 283 218 215 206 183  MCV 92.1 93.1 89.5 89.4 90.6  MCH 30.6 30.8 30.3 30.1 29.9  MCHC 33.3 33.1 33.9 33.6 33.0  RDW 14.8 15.0 14.0 14.0 14.9  LYMPHSABS 2.0  --   --   --  1.3  MONOABS 2.8*  --   --   --  0.9  EOSABS 0.0  --   --   --  0.1  BASOSABS 0.0  --   --   --  0.0   ------------------------------------------------------------------------------------------------------------------  Chemistries  Recent Labs  Lab 12/07/17 1719 12/08/17 0620 12/09/17 0318 12/10/17 0403 12/14/17 1352  NA 140 140 142 146* 138  K 4.1 4.1 3.5 3.4* 4.0  CL 102 108 109 113* 100*  CO2 27 26 24 26  21*  GLUCOSE 141* 190* 198* 147* 107*  BUN 23* 21* 33* 40* 26*  CREATININE 1.55* 1.09 0.95 0.86 1.21  CALCIUM 8.4* 8.0* 8.7* 8.6* 8.8*  MG  --   --  2.2  --   --   AST 20  --   --   --  20  ALT 23  --   --   --  29  ALKPHOS 64  --   --   --  60  BILITOT 1.1  --   --   --  2.4*   ------------------------------------------------------------------------------------------------------------------ estimated creatinine  clearance is 53.3 mL/min (by C-G formula based on SCr of 1.21 mg/dL). ------------------------------------------------------------------------------------------------------------------ No results for input(s): TSH, T4TOTAL, T3FREE, THYROIDAB in the last 72 hours.  Invalid input(s): FREET3  Coagulation profile No results for input(s): INR, PROTIME in the last 168 hours. ------------------------------------------------------------------------------------------------------------------- No results for input(s): DDIMER in the last 72 hours. -------------------------------------------------------------------------------------------------------------------  Cardiac Enzymes Recent Labs  Lab 12/08/17 0329 12/08/17 1013 12/14/17 1334  TROPONINI <0.03 <0.03 <0.03   ------------------------------------------------------------------------------------------------------------------    Component Value Date/Time   BNP 59.0 11/25/2017 2031     ---------------------------------------------------------------------------------------------------------------  Urinalysis    Component Value Date/Time   COLORURINE YELLOW 12/07/2017 1719   APPEARANCEUR CLEAR 12/07/2017 1719   LABSPEC 1.004 (L) 12/07/2017 1719   PHURINE 7.0 12/07/2017 1719   GLUCOSEU NEGATIVE 12/07/2017 1719   HGBUR MODERATE (A) 12/07/2017 1719   BILIRUBINUR NEGATIVE 12/07/2017 1719   KETONESUR NEGATIVE 12/07/2017 1719   PROTEINUR NEGATIVE 12/07/2017 1719   NITRITE NEGATIVE 12/07/2017 1719   LEUKOCYTESUR LARGE (A) 12/07/2017 1719    ----------------------------------------------------------------------------------------------------------------   Imaging Results:    Dg Chest 2 View  Result Date: 12/14/2017 CLINICAL DATA:  Shortness of Breath EXAM: CHEST - 2 VIEW COMPARISON:  3/25/9 FINDINGS: Cardiac shadow is within normal  limits. Lungs are hyper aerated bilaterally. Previously seen right basilar atelectatic changes have  resolved. No sizable effusion is noted. No bony abnormality is noted. IMPRESSION: Mild COPD.  Previously seen right basilar changes have resolved. Electronically Signed   By: Inez Catalina M.D.   On: 12/14/2017 13:34      Assessment & Plan:    Principal Problem:   Sepsis ([place]) Active Problems:   COPD, severe (HCC)   Tachycardia   Hypotension ?sepsis vs stopping prednisone Trop I q6hx 3 Check cortisol If persistent please order cardiac echo  Dyspnea  Copd exacerbation Solumedrol 80mg  iv q8h vanco iv, zosyn iv pharmacy  To dose spiriva 1puff qday symbicort => dulera 2puff bid  Tachycardia Check trop I q6h x3  Pafib Cont eliquis Cont Cardizem  Weakness Check CT brain  DVT Prophylaxis Eliquis - SCDs  AM Labs Ordered, also please review Full Orders  Family Communication: Admission, patients condition and plan of care including tests being ordered have been discussed with the patient  who indicate understanding and agree with the plan and Code Status.  Code Status FULL CODE  Likely DC to  home  Condition GUARDED    Consults called: none  Admission status: inpatient   Time spent in minutes : 45   Jani Gravel M.D on 12/14/2017 at 4:18 PM  Between 7am to 7pm - Pager - [phone]  . After 7pm go to [email or web address] - password [email or web address]  [facility] - Office  [phone]

## 2017-12-14 NOTE — Progress Notes (Signed)
Chief Complaint  Patient presents with  . Shortness of Breath    Since Saturday. Started using 2LPM this morning went up to 3LPM then back down to 2LPM   Patient called today and request to be seen in the office.  He said he had increasing shortness of breath since yesterday.  He was advised by the office staff he should probably go to the emergency room.  She refused, and wanted to be seen here hoping I could give him something to keep him out of the emergency room.  [name] [name] was hospitalized 12/07/2017 through 12/10/2017 for nosocomial acquired pneumonia, respiratory failure, hypoxia.  He was treated with antibiotics, IV fluids, nebulizers, and steroids.  He was discharged home on Thursday of last week to finish 2 or 3 days of Levaquin.  He is not taking any prednisone although the chart indicates he should be.  I am uncertain, I think there is a misunderstanding, about his prednisone taper. He felt well Thursday afternoon and Friday.  Saturday he felt tired he thought from doing too much.  Sunday he had progressive shortness of breath, weakness, fatigue, and periods of confusion.  Initially he was on oxygen only at night, but since Sunday he is needed oxygen day and night to keep his oxygen sats over 90%. He does not think he has had any sweats or chills or fever since going home.  His appetite is terrible, he is eaten very little.  He is trying to drink sips of liquids. He is compliant with his inhalers. He again requests a prescription for portable condenser.  Because of his general debility, and chronic hypoxia, he needs home oxygen and is unable to manage tanks because of their bulk and weight. His hospital stay, lab test results, imaging, and consultations are reviewed with he and his wife [name].  He was started on anticoagulation for his known atrial fibrillation and states he is tolerating the Eliquis twice a day without difficulty.  No bleeding or bruising noted.  Patient Active  Problem List   Diagnosis Date Noted  . Tachycardia 12/14/2017  . HCAP (healthcare-associated pneumonia) 12/09/2017  . Malnutrition of moderate degree 12/09/2017  . Sepsis ([place]) 12/07/2017  . Protein-calorie malnutrition, severe 11/27/2017  . Orthostatic hypotension 11/25/2017  . COPD with acute exacerbation ([place]) 11/25/2017  . Leukocytosis 11/25/2017  . Abdominal aortic aneurysm (AAA) without rupture ([place]) 10/27/2017  . Multiple lung nodules on CT 05/14/2016  . COPD, severe ([place]) 02/19/2016  . Emphysema of lung ([place]) 02/19/2016  . Personal history of prostate cancer   . Mixed hyperlipidemia   . DJD (degenerative joint disease), lumbar   . Neuropathy   . Back pain   . 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 ATRIAL FIBRILLATION, PAROXYSMAL 08/31/2007    No facility-administered encounter medications on file as of 12/14/2017.    Outpatient Encounter Medications as of 12/14/2017  Medication Sig  . apixaban (ELIQUIS) 5 MG TABS tablet Take 1 tablet (5 mg total) by mouth 2 (two) times daily.  . budesonide-formoterol (SYMBICORT) 160-4.5 MCG/ACT inhaler Inhale 2 puffs into the lungs 2 (two) times daily.  Marland Kitchen diltiazem (DILT-XR) 120 MG 24 hr capsule TAKE 2 CAPSULES BY MOUTH ONCE DAILY IN THE MORNING AND 1 CAPSULE ONCE DAILY IN THE EVENING (Patient taking differently: Take 120 mg by mouth See admin instructions. TAKE 2 CAPSULES BY MOUTH ONCE DAILY IN THE MORNING AND 1 CAPSULE ONCE DAILY IN THE EVENING)  . ipratropium (ATROVENT) 0.02 % nebulizer solution Take  2.5 mLs (0.5 mg total) by nebulization every 6 (six) hours as needed for wheezing or shortness of breath.  Marland Kitchen LYRICA 150 MG capsule TAKE ONE CAPSULE BY MOUTH THREE TIMES DAILY  . Misc. Devices (ACAPELLA) MISC Use as directed  . Oxycodone HCl 10 MG TABS Take 10 mg by mouth every 6 (six) hours as needed (pain).   . Spacer/Aero-Holding Chambers (AEROCHAMBER MV) inhaler Use as instructed  . SPIRIVA RESPIMAT 2.5 MCG/ACT AERS INHALE TWO PUFFS BY MOUTH  ONCE DAILY    Allergies  Allergen Reactions  . Albuterol Other (See Comments)    Caused extreme tachycardia. If used by continuous infusion.  May use the inhaler    Review of Systems  Constitutional: Positive for activity change, appetite change and fatigue. Negative for unexpected weight change.  HENT: Negative for congestion, postnasal drip and rhinorrhea.   Eyes: Negative for redness and visual disturbance.  Respiratory: Positive for cough, chest tightness, shortness of breath and wheezing.   Cardiovascular: Negative for chest pain and palpitations.  Gastrointestinal: Negative for abdominal pain, constipation and diarrhea.  Genitourinary: Negative for difficulty urinating and frequency.  Musculoskeletal: Positive for arthralgias, back pain and gait problem.       Chronic orthopedic complaints, chronic pain management  Skin: Negative for color change and pallor.  Neurological: Positive for dizziness, weakness and light-headedness.       Generally weak and dizzy.  Unable to walk  Hematological: Negative for adenopathy. Does not bruise/bleed easily.  Psychiatric/Behavioral: Negative for dysphoric mood and sleep disturbance. The patient is not nervous/anxious.     BP (!) 86/58   Pulse (!) 112   Temp 98.4 F (36.9 C)   Resp (!) 24   Ht 5\' 10"  (1.778 m)   Wt 160 lb (72.6 kg)   SpO2 (!) 88% Comment: RA  BMI 22.96 kg/m   Physical Exam  Constitutional: He appears toxic. He appears ill. He appears distressed.  Thin, frail, weak appearing, very tired, obvious accessory muscles  HENT:  Head: Normocephalic and atraumatic.  Mouth/Throat: Oropharynx is clear and moist.  Eyes: Pupils are equal, round, and reactive to light. EOM are normal.  Neck: Normal range of motion.  Cardiovascular: Regular rhythm.  Tachycardia  Pulmonary/Chest: Accessory muscle usage present. Tachypnea noted. He is in respiratory distress. He has rhonchi in the right lower field and the left lower field. He has  rales in the right lower field and the left lower field. He exhibits no tenderness and no crepitus.  Rales, coarse rhonchi in both lung bases  Abdominal: Soft. Bowel sounds are normal.  Musculoskeletal:       Right lower leg: He exhibits no edema.       Left lower leg: He exhibits no edema.  Lymphadenopathy:    He has no cervical adenopathy.  Neurological: He is alert.  Skin: Skin is warm and dry.  Psychiatric: He has a normal mood and affect. His behavior is normal.  Frustrated with repeated illness/lack of improvement    ASSESSMENT/PLAN:  1. COPD with chronic bronchitis and emphysema ([place])  I am concerned about the patient's vital signs, hypertension, tachycardia, and reduced oxygen.  I believe the physician in the hospital intended for him to go home on prednisone.  I believe he should be back in the hospital.  He initially rebelled against this idea, however I called his pulmonary medicine office in the corroborated my opinion that he should go back to the emergency room for evaluation and treatment.  -  DME Other see comment-order is written for the portable concentrator for [facility]   Patient Instructions  GO TO ER    [name], MD

## 2017-12-14 NOTE — ED Provider Notes (Signed)
[facility] EMERGENCY DEPARTMENT Provider Note   CSN: [id number] Arrival date & time: 12/14/17  1236     History   Chief Complaint Chief Complaint  Patient presents with  . Shortness of Breath    HPI [name] [name] is a 77 y.o. male.  HPI   76yM with dyspnea and fatigue. Recent admission. He presented to his pulmonologist on 12/07/2017 and was noted to be febrile, lethargic, hypoxic and hypotensive. Patient was admitted for sepsissecondary to HCAP. He was discharged this past Thursday with three additional days of levaquin. He reports he was feeling better until about Saturday. Since then he has progressively worsening symptoms of fatigue, dyspnea, anorexia. Chronic urinary complaints 2/2 hx of prostate CA and urethral stricture. Last voided just before coming to the ED.     Past Medical History:  Diagnosis Date  . AAA (abdominal aortic aneurysm) ([place])   . COPD with emphysema ([place])    Dr. Joya Gaskins, Gold stage C  . DJD (degenerative joint disease), lumbar   . Emphysema of lung ([place])   . History of prostate cancer    Status post radioactive seed implants  . History of shingles   . Incomplete emptying of bladder   . Mixed hyperlipidemia   . PAF (paroxysmal atrial fibrillation) ([place])   . Palpitations   . Peripheral neuropathy   . Sciatic nerve pain    Secondary to shingles 2011  . Urethral stricture   . Wears partial dentures    Upper and lower    Patient Active Problem List   Diagnosis Date Noted  . HCAP (healthcare-associated pneumonia) 12/09/2017  . Malnutrition of moderate degree 12/09/2017  . Sepsis ([place]) 12/07/2017  . Protein-calorie malnutrition, severe 11/27/2017  . Orthostatic hypotension 11/25/2017  . COPD with acute exacerbation ([place]) 11/25/2017  . Leukocytosis 11/25/2017  . Abdominal aortic aneurysm (AAA) without rupture ([place]) 10/27/2017  . Multiple lung nodules on CT 05/14/2016  . COPD, severe ([place]) 02/19/2016  . Emphysema of lung ([place]) 02/19/2016  .  Personal history of prostate cancer   . Mixed hyperlipidemia   . DJD (degenerative joint disease), lumbar   . Neuropathy   . Back pain   . 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 ATRIAL FIBRILLATION, PAROXYSMAL 08/31/2007    Past Surgical History:  Procedure Laterality Date  . CATARACT EXTRACTION W/ INTRAOCULAR LENS  IMPLANT, BILATERAL  2012  . COLONOSCOPY  02-14-2003  . CYSTO/  BALLOON DILATION AND INCISION BLADDER NECK CONTRACTURE  X5  LAST ONE 01-24-2009  . CYSTOSCOPY WITH URETHRAL DILATATION  09/15/2011   Procedure: CYSTOSCOPY WITH URETHRAL DILATATION;  Surgeon: Bernestine Amass, MD;  Location[facility];  Service: Urology;  Laterality: N/A;  CYSTOSCOPY, BALLOON DILATION   . CYSTOSCOPY WITH URETHRAL DILATATION N/A 04/03/2014   Procedure: CYSTOSCOPY WITH dilatation;  Surgeon: Bernestine Amass, MD;  [facility];  Service: Urology;  Laterality: N/A;  . EYE SURGERY    . RADIOACTIVE PROSTATE SEED IMPLANTS/  CYSTO WITH BALLOON DILATION BLADDER NECK CONTRACTURE  11-26-2006        Home Medications    Prior to Admission medications   Medication Sig Start Date End Date Taking? Authorizing Provider  apixaban (ELIQUIS) 5 MG TABS tablet Take 1 tablet (5 mg total) by mouth 2 (two) times daily. 12/10/17  Yes Dessa Phi, DO  budesonide-formoterol (SYMBICORT) 160-4.5 MCG/ACT inhaler Inhale 2 puffs into the lungs 2 (two) times daily. 11/17/17  Yes Parrett, Fonnie Mu, NP  diltiazem (DILT-XR)  120 MG 24 hr capsule TAKE 2 CAPSULES BY MOUTH ONCE DAILY IN THE MORNING AND 1 CAPSULE ONCE DAILY IN THE EVENING Patient taking differently: Take 120 mg by mouth See admin instructions. TAKE 2 CAPSULES BY MOUTH ONCE DAILY IN THE MORNING AND 1 CAPSULE ONCE DAILY IN THE EVENING 11/06/17  Yes Satira Sark, MD  ipratropium (ATROVENT) 0.02 % nebulizer solution Take 2.5 mLs (0.5 mg total) by nebulization every 6 (six) hours as needed for wheezing or shortness of breath. 11/30/17   Yes Collene Gobble, MD  LYRICA 150 MG capsule TAKE ONE CAPSULE BY MOUTH THREE TIMES DAILY 05/23/14  Yes Marcial Pacas, MD  Oxycodone HCl 10 MG TABS Take 10 mg by mouth every 6 (six) hours as needed (pain).    Yes [provider]  SPIRIVA RESPIMAT 2.5 MCG/ACT AERS INHALE TWO PUFFS BY MOUTH ONCE DAILY 12/19/16  Yes Javier Glazier, MD  Misc. Devices [facility]) MISC Use as directed 10/21/16   Javier Glazier, MD  Spacer/Aero-Holding Chambers (AEROCHAMBER MV) inhaler Use as instructed 10/21/16   Javier Glazier, MD    Family History Family History  Problem Relation Age of Onset  . COPD Mother   . Heart disease Brother        died at 65    Social History Social History   Tobacco Use  . Smoking status: Former Smoker    Packs/day: 2.00    Years: 40.00    Pack years: 80.00    Types: Cigarettes    Start date: 06/19/1954    Last attempt to quit: 09/15/1998    Years since quitting: 19.2  . Smokeless tobacco: Never Used  . Tobacco comment: Counseled to remain smoke free  Substance Use Topics  . Alcohol use: No    Alcohol/week: 0.0 oz  . Drug use: No     Allergies   Albuterol   Review of Systems Review of Systems  All systems reviewed and negative, other than as noted in HPI.  Physical Exam Updated Vital Signs BP (!) 118/99 (BP Location: Right Arm)   Pulse (!) 101   Temp 98 F (36.7 C) (Oral)   Resp 19   Ht 5\' 10"  (1.778 m)   Wt 72.6 kg (160 lb)   SpO2 97%   BMI 22.96 kg/m   Physical Exam  Constitutional: He appears well-developed and well-nourished. No distress.  Laying in bed. Appears tired.   HENT:  Head: Normocephalic and atraumatic.  Eyes: Conjunctivae are normal. Right eye exhibits no discharge. Left eye exhibits no discharge.  Neck: Neck supple.  Cardiovascular: Normal rate, regular rhythm and normal heart sounds. Exam reveals no gallop and no friction rub.  No murmur heard. Mild tachcyardia  Pulmonary/Chest: Effort normal and breath sounds normal.  No respiratory distress.  Abdominal: Soft. He exhibits no distension. There is no tenderness.  Musculoskeletal: He exhibits no edema or tenderness.  Lower extremities symmetric as compared to each other. No calf tenderness. Negative Homan's. No palpable cords.   Neurological: He is alert.  Skin: Skin is warm and dry.  Psychiatric: He has a normal mood and affect. His behavior is normal. Thought content normal.  Nursing note and vitals reviewed.    ED Treatments / Results  Labs (all labs ordered are listed, but only abnormal results are displayed) Labs Reviewed  LACTIC ACID, PLASMA - Abnormal; Notable for the following components:      Result Value   Lactic Acid, Venous 3.5 (*)  All other components within normal limits  CBC WITH DIFFERENTIAL/PLATELET - Abnormal; Notable for the following components:   WBC 19.8 (*)    Neutro Abs 17.5 (*)    All other components within normal limits  COMPREHENSIVE METABOLIC PANEL - Abnormal; Notable for the following components:   Chloride 100 (*)    CO2 21 (*)    Glucose, Bld 107 (*)    BUN 26 (*)    Calcium 8.8 (*)    Albumin 3.2 (*)    Total Bilirubin 2.4 (*)    GFR calc non Af Amer 56 (*)    Anion gap 17 (*)    All other components within normal limits  CULTURE, BLOOD (ROUTINE X 2)  CULTURE, BLOOD (ROUTINE X 2)  INFLUENZA PANEL BY PCR (TYPE A & B)  TROPONIN I  LACTIC ACID, PLASMA  URINALYSIS, ROUTINE W REFLEX MICROSCOPIC    EKG EKG Interpretation  Date/Time:  Monday December 14 2017 12:46:54 EDT Ventricular Rate:  102 PR Interval:    QRS Duration: 78 QT Interval:  334 QTC Calculation: 435 R Axis:   43 Text Interpretation:  Sinus tachycardia Atrial premature complexes Confirmed by Virgel Manifold [phone]) on 12/14/2017 1:13:43 PM   Radiology Dg Chest 2 View  Result Date: 12/14/2017 CLINICAL DATA:  Shortness of Breath EXAM: CHEST - 2 VIEW COMPARISON:  3/25/9 FINDINGS: Cardiac shadow is within normal limits. Lungs are hyper aerated  bilaterally. Previously seen right basilar atelectatic changes have resolved. No sizable effusion is noted. No bony abnormality is noted. IMPRESSION: Mild COPD.  Previously seen right basilar changes have resolved. Electronically Signed   By: Inez Catalina M.D.   On: 12/14/2017 13:34    Procedures Procedures (including critical care time)  Medications Ordered in ED Medications  sodium chloride 0.9 % bolus 2,000 mL (has no administration in time range)  piperacillin-tazobactam (ZOSYN) IVPB 3.375 g (has no administration in time range)  vancomycin (VANCOCIN) IVPB 1000 mg/200 mL premix (has no administration in time range)     Initial Impression / Assessment and Plan / ED Course  I have reviewed the triage vital signs and the nursing notes.  Pertinent labs & imaging results that were available during my care of the patient were reviewed by me and considered in my medical decision making (see chart for details).     76yM with fever, dyspnea and generalized weakness. Recent admit with pneumonia. CXR improved. Hypoxemia on RA when arrived to ED but resolved when placed back on supplemental o2 which he is on at home. He received empiric abx for sepsis although source not currently clear.WBC of 19.8 and lactic acid of 3.5. Subjective fever but afebrile in ED.  UA is pending. Initial hypotension is responsive to IVF.   Final Clinical Impressions(s) / ED Diagnoses   Final diagnoses:  Generalized weakness  Hypotension, unspecified hypotension type  Elevated lactic acid level    ED Discharge Orders    None       Virgel Manifold, MD 12/14/17 [phone]

## 2017-12-14 NOTE — ED Notes (Signed)
Pt tried to provide urine sample.  Unable to at this time. Will try again at later time

## 2017-12-14 NOTE — ED Notes (Signed)
Date and time results received: 12/14/17 1521 (use smartphrase ".now" to insert current time)  Test: lactic acid Critical Value: 3.5  Name of Provider Notified: [place]  Orders Received? Or Actions Taken?: Orders Received - See Orders for details

## 2017-12-15 ENCOUNTER — Encounter: Payer: Medicare Other | Admitting: Family Medicine

## 2017-12-15 DIAGNOSIS — J9610 Chronic respiratory failure, unspecified whether with hypoxia or hypercapnia: Secondary | ICD-10-CM

## 2017-12-15 DIAGNOSIS — R7989 Other specified abnormal findings of blood chemistry: Secondary | ICD-10-CM

## 2017-12-15 DIAGNOSIS — J441 Chronic obstructive pulmonary disease with (acute) exacerbation: Secondary | ICD-10-CM

## 2017-12-15 DIAGNOSIS — R6510 Systemic inflammatory response syndrome (SIRS) of non-infectious origin without acute organ dysfunction: Secondary | ICD-10-CM

## 2017-12-15 DIAGNOSIS — I959 Hypotension, unspecified: Secondary | ICD-10-CM

## 2017-12-15 DIAGNOSIS — I480 Paroxysmal atrial fibrillation: Secondary | ICD-10-CM

## 2017-12-15 LAB — BILIRUBIN, FRACTIONATED(TOT/DIR/INDIR)
BILIRUBIN INDIRECT: 1.1 mg/dL — AB (ref 0.3–0.9)
Bilirubin, Direct: 0.3 mg/dL (ref 0.1–0.5)
Total Bilirubin: 1.4 mg/dL — ABNORMAL HIGH (ref 0.3–1.2)

## 2017-12-15 LAB — COMPREHENSIVE METABOLIC PANEL
ALT: 29 U/L (ref 17–63)
ANION GAP: 13 (ref 5–15)
AST: 31 U/L (ref 15–41)
Albumin: 2.7 g/dL — ABNORMAL LOW (ref 3.5–5.0)
Alkaline Phosphatase: 56 U/L (ref 38–126)
BUN: 24 mg/dL — ABNORMAL HIGH (ref 6–20)
CHLORIDE: 107 mmol/L (ref 101–111)
CO2: 19 mmol/L — AB (ref 22–32)
Calcium: 8.6 mg/dL — ABNORMAL LOW (ref 8.9–10.3)
Creatinine, Ser: 1.08 mg/dL (ref 0.61–1.24)
Glucose, Bld: 254 mg/dL — ABNORMAL HIGH (ref 65–99)
POTASSIUM: 3.8 mmol/L (ref 3.5–5.1)
SODIUM: 139 mmol/L (ref 135–145)
Total Bilirubin: 1.6 mg/dL — ABNORMAL HIGH (ref 0.3–1.2)
Total Protein: 6.5 g/dL (ref 6.5–8.1)

## 2017-12-15 LAB — CBC
HCT: 40.1 % (ref 39.0–52.0)
HEMOGLOBIN: 13.8 g/dL (ref 13.0–17.0)
MCH: 30.1 pg (ref 26.0–34.0)
MCHC: 34.4 g/dL (ref 30.0–36.0)
MCV: 87.4 fL (ref 78.0–100.0)
Platelets: 173 10*3/uL (ref 150–400)
RBC: 4.59 MIL/uL (ref 4.22–5.81)
RDW: 14.4 % (ref 11.5–15.5)
WBC: 8.1 10*3/uL (ref 4.0–10.5)

## 2017-12-15 LAB — CORTISOL: CORTISOL PLASMA: 12.3 ug/dL

## 2017-12-15 LAB — TROPONIN I

## 2017-12-15 MED ORDER — POTASSIUM CHLORIDE IN NACL 20-0.9 MEQ/L-% IV SOLN
INTRAVENOUS | Status: DC
  Administered 2017-12-15 – 2017-12-17 (×3): via INTRAVENOUS

## 2017-12-15 MED ORDER — LEVALBUTEROL HCL 1.25 MG/0.5ML IN NEBU
1.2500 mg | INHALATION_SOLUTION | Freq: Three times a day (TID) | RESPIRATORY_TRACT | Status: DC
  Administered 2017-12-15 (×2): 1.25 mg via RESPIRATORY_TRACT
  Filled 2017-12-15 (×2): qty 0.5

## 2017-12-15 MED ORDER — IPRATROPIUM BROMIDE 0.02 % IN SOLN
0.5000 mg | Freq: Three times a day (TID) | RESPIRATORY_TRACT | Status: DC
  Administered 2017-12-16 – 2017-12-17 (×4): 0.5 mg via RESPIRATORY_TRACT
  Filled 2017-12-15 (×5): qty 2.5

## 2017-12-15 MED ORDER — METHYLPREDNISOLONE SODIUM SUCC 40 MG IJ SOLR
40.0000 mg | Freq: Two times a day (BID) | INTRAMUSCULAR | Status: DC
  Administered 2017-12-15 – 2017-12-16 (×2): 40 mg via INTRAVENOUS
  Filled 2017-12-15 (×2): qty 1

## 2017-12-15 MED ORDER — LEVALBUTEROL HCL 1.25 MG/0.5ML IN NEBU
1.2500 mg | INHALATION_SOLUTION | Freq: Three times a day (TID) | RESPIRATORY_TRACT | Status: DC
  Administered 2017-12-16 – 2017-12-17 (×4): 1.25 mg via RESPIRATORY_TRACT
  Filled 2017-12-15 (×5): qty 0.5

## 2017-12-15 MED ORDER — IPRATROPIUM BROMIDE 0.02 % IN SOLN
0.5000 mg | Freq: Three times a day (TID) | RESPIRATORY_TRACT | Status: DC
  Administered 2017-12-15 (×2): 0.5 mg via RESPIRATORY_TRACT
  Filled 2017-12-15 (×2): qty 2.5

## 2017-12-15 MED ORDER — BUDESONIDE 0.5 MG/2ML IN SUSP
0.5000 mg | Freq: Two times a day (BID) | RESPIRATORY_TRACT | Status: DC
  Administered 2017-12-15 – 2017-12-17 (×4): 0.5 mg via RESPIRATORY_TRACT
  Filled 2017-12-15 (×4): qty 2

## 2017-12-15 NOTE — Progress Notes (Signed)
PROGRESS NOTE  [name] [name] VOJ:[id number] DOB: [birth year] DOA: 12/14/2017 PCP: [name], MD  Brief History:  77 year old male with a history of hypertension, hyperlipidemia, paroxysmal atrial fibrillation, COPD, prostate cancer, bladder neck constriction and chronic respiratory failure on 4 L at home at night presenting with 2-3-day history of increasing shortness of breath, generalized weakness, and decreased oral intake.  The patient was recently discharged from the hospital after a stay from 12/07/17 through 11/12/17 during which she was treated for HCAP and COPD exacerbation.  The patient was discharged home with levofloxacin and a prednisone taper.  The patient picked up his prednisone but did not start taking his prednisone.  Patient was feeling fine until the afternoon of 12/12/2017 when he began having the above symptoms.  He went to see his primary care provider for follow-up.  During that appointment, the patient was noted to be hypoxic and hypotensive with systolic blood pressure in the 80s.  As a result, the patient was sent to the emergency department for further evaluation.  The patient was noted to be tachycardic with low-grade temperature at the time of admission of 99.1 F with soft blood pressures.  The patient was noted to have WBC 19.8 with lactic acid 3.50.  He was started on IV fluids and antibiotics.  Assessment/Plan: SIRS/ Hypotension/generalized weakness -Likely due to volume depletion with decreased oral intake and a degree of possible adrenal insufficiency as the patient did not take his prednisone as directed after discharge -Continue IV fluids -Continue empiric antibiotics pending culture data -Urinalysis negative for pyuria -Chest x-ray negative for consolidation  Acute on chronic chronic respiratory failure with hypoxia -The patient had oxygen saturation 88% on room air -Likely a mild degree of COPD exacerbation -Continue Solu-Medrol, but start  weaning -Start Pulmicort -Continue duo nebs  COPD exacerbation -Mild -Restart steroids as above  Generalized weakness -Multifactorial including volume depletion, deconditioning, a degree of adrenal insufficiency and possible sepsis -Continue Solu-Medrol, but decrease dose -TSH -PT evaluation -Serum B12 -Urinalysis negative for pyuria  Paroxysmal atrial fibrillation -Presently in sinus rhythm -Continue apixaban -Continue diltiazem CD  Bladder neck contracture -Follows with Dr. Risa Grill [facility] -Daily Flomax  Chronic pain -Continue pregabalin, oxycodone  AAA -11/26/17 abd US shows diameter 2.7 cm up from 2.5 on 11/06/2006 -yearly surveillance      Disposition Plan:   Home in 1-2 days  Family Communication:  Spouse updated at bedside 4/2  Consultants:  none  Code Status:  FULL   DVT Prophylaxis:  apixaban   Procedures: As Listed in Progress Note Above  Antibiotics: vanco 4/1>>> Zosyn 4/1>>    Subjective: Patient still feels weak but it is improving.  His breathing is better than yesterday.  He still has some dyspnea on exertion.  He has a nonproductive cough.  He denies any fevers, chills, chest pain, nausea, vomiting, diarrhea, abdominal pain, dysuria, hematuria.  There is no hematochezia or melena.  Objective: Vitals:   12/14/17 2137 12/14/17 2150 12/15/17 0604 12/15/17 0919  BP: (!) 112/57  (!) 97/56   Pulse: 92  100   Resp: 20  20   Temp: 98.3 F (36.8 C)  97.6 F (36.4 C)   TempSrc: Oral  Oral   SpO2: 97% 97% 97% 94%  Weight:      Height:        Intake/Output Summary (Last 24 hours) at 12/15/2017 1207 Last data filed at 12/14/2017 2150 Gross per 24  hour  Intake 2490 ml  Output 1100 ml  Net 1390 ml   Weight change:  Exam:   General:  Pt is alert, follows commands appropriately, not in acute distress  HEENT: No icterus, No thrush, No neck mass, [place]/AT  Cardiovascular: RRR, S1/S2, no rubs, no gallops  Respiratory: CTA  bilaterally, no wheezing, no crackles, no rhonchi  Abdomen: Soft/+BS, non tender, non distended, no guarding  Extremities: No edema, No lymphangitis, No petechiae, No rashes, no synovitis   Data Reviewed: I have personally reviewed following labs and imaging studies Basic Metabolic Panel: Recent Labs  Lab 12/09/17 0318 12/10/17 0403 12/14/17 1352 12/15/17 0536  NA 142 146* 138 139  K 3.5 3.4* 4.0 3.8  CL 109 113* 100* 107  CO2 24 26 21* 19*  GLUCOSE 198* 147* 107* 254*  BUN 33* 40* 26* 24*  CREATININE 0.95 0.86 1.21 1.08  CALCIUM 8.7* 8.6* 8.8* 8.6*  MG 2.2  --   --   --    Liver Function Tests: Recent Labs  Lab 12/14/17 1352 12/15/17 0536  AST 20 31  ALT 29 29  ALKPHOS 60 56  BILITOT 2.4* 1.4*  1.6*  PROT 7.3 6.5  ALBUMIN 3.2* 2.7*   No results for input(s): LIPASE, AMYLASE in the last 168 hours. No results for input(s): AMMONIA in the last 168 hours. Coagulation Profile: No results for input(s): INR, PROTIME in the last 168 hours. CBC: Recent Labs  Lab 12/09/17 0318 12/10/17 0403 12/14/17 1352 12/15/17 0536  WBC 17.1* 17.1* 19.8* 8.1  NEUTROABS  --   --  17.5*  --   HGB 12.1* 11.9* 14.9 13.8  HCT 35.7* 35.4* 45.2 40.1  MCV 89.5 89.4 90.6 87.4  PLT 215 206 183 173   Cardiac Enzymes: Recent Labs  Lab 12/14/17 1334 12/14/17 1749 12/14/17 2344 12/15/17 0536  TROPONINI <0.03 <0.03 <0.03 <0.03   BNP: Invalid input(s): POCBNP CBG: No results for input(s): GLUCAP in the last 168 hours. HbA1C: No results for input(s): HGBA1C in the last 72 hours. Urine analysis:    Component Value Date/Time   COLORURINE YELLOW 12/14/2017 1600   APPEARANCEUR CLEAR 12/14/2017 1600   LABSPEC 1.009 12/14/2017 1600   PHURINE 6.0 12/14/2017 1600   GLUCOSEU NEGATIVE 12/14/2017 1600   HGBUR SMALL (A) 12/14/2017 1600   BILIRUBINUR NEGATIVE 12/14/2017 1600   KETONESUR 20 (A) 12/14/2017 1600   PROTEINUR NEGATIVE 12/14/2017 1600   NITRITE NEGATIVE 12/14/2017 1600    LEUKOCYTESUR NEGATIVE 12/14/2017 1600   Sepsis Labs: @LABRCNTIP (procalcitonin:4,lacticidven:4) ) Recent Results (from the past 240 hour(s))  Blood Culture (routine x 2)     Status: None   Collection Time: 12/07/17  5:30 PM  Result Value Ref Range Status   Specimen Description   Final    BLOOD LEFT ANTECUBITAL Performed at [facility], [place] [address], [place], [place] [address]    Special Requests   Final    BOTTLES DRAWN AEROBIC AND ANAEROBIC Blood Culture results may not be optimal due to an excessive volume of blood received in culture bottles Performed at [place] [address], [place], [place] [address]    Culture   Final    NO GROWTH 5 DAYS Performed at [facility], [place] [address] [place], [place] [address]    Report Status 12/12/2017 FINAL  Final  Blood Culture (routine x 2)     Status: None   Collection Time: 12/07/17  5:41 PM  Result Value Ref  Range Status   Specimen Description   Final    BLOOD RIGHT FOREARM Performed at [place] [address] [place], [place] [address]    Special Requests   Final    BOTTLES DRAWN AEROBIC AND ANAEROBIC Blood Culture results may not be optimal due to an excessive volume of blood received in culture bottles Performed at [place] [address] [place], [place] [address]    Culture   Final    NO GROWTH 5 DAYS Performed at [facility], [place] [address] [place], [place] [address]    Report Status 12/12/2017 FINAL  Final  Culture, sputum-assessment     Status: None   Collection Time: 12/08/17 10:13 AM  Result Value Ref Range Status   Specimen Description SPUTUM  Final   Special Requests NONE  Final   Sputum evaluation   Final    THIS SPECIMEN IS ACCEPTABLE FOR SPUTUM CULTURE Performed at [facility], [place] [address] [place], [place] [address]    Report Status 12/08/2017 FINAL  Final  Culture, respiratory  (NON-Expectorated)     Status: None   Collection Time: 12/08/17 10:13 AM  Result Value Ref Range Status   Specimen Description   Final    SPUTUM Performed at [place] [address] [place], [place] [address]    Special Requests   Final    NONE Reflexed from [id number] Performed at [facility], [place] [address] [place], [place] [address]    Gram Stain   Final    RARE WBC PRESENT, PREDOMINANTLY PMN FEW SQUAMOUS EPITHELIAL CELLS PRESENT FEW GRAM POSITIVE COCCI    Culture   Final    Consistent with normal respiratory flora. Performed at [facility], [place] [address], [place], [place] [address]    Report Status 12/10/2017 FINAL  Final  MRSA PCR Screening     Status: None   Collection Time: 12/08/17  3:36 PM  Result Value Ref Range Status   MRSA by PCR NEGATIVE NEGATIVE Final    Comment:        The GeneXpert MRSA Assay (FDA approved for NASAL specimens only), is one component of a comprehensive MRSA colonization surveillance program. It is not intended to diagnose MRSA infection nor to guide or monitor treatment for MRSA infections. Performed at [facility], [place] [address] [place], [place] [address]   Blood culture (routine x 2)     Status: None (Preliminary result)   Collection Time: 12/14/17  1:08 PM  Result Value Ref Range Status   Specimen Description LEFT ANTECUBITAL  Final   Special Requests   Final    BOTTLES DRAWN AEROBIC AND ANAEROBIC Blood Culture adequate volume   Culture   Final    NO GROWTH < 24 HOURS Performed at [facility], [address], [place], [place] [address]    Report Status PENDING  Incomplete  Blood culture (routine x 2)     Status: None (Preliminary result)   Collection Time: 12/14/17  1:13 PM  Result Value Ref Range Status   Specimen Description RIGHT ANTECUBITAL  Final   Special Requests   Final    BOTTLES DRAWN AEROBIC AND ANAEROBIC Blood Culture results may not be optimal  due to an excessive volume of blood received in culture bottles   Culture   Final    NO GROWTH < 24 HOURS Performed at [facility], [address] [place], [name]  [address]    Report Status PENDING  Incomplete     Scheduled Meds: . apixaban  5 mg Oral BID  . diltiazem  240 mg Oral Daily   And  . diltiazem  120 mg Oral QHS  . feeding supplement (ENSURE ENLIVE)  237 mL Oral BID BM  . methylPREDNISolone (SOLU-MEDROL) injection  80 mg Intravenous Q8H  . mometasone-formoterol  2 puff Inhalation BID  . pregabalin  150 mg Oral TID  . tiotropium  18 mcg Inhalation Daily   Continuous Infusions: . piperacillin-tazobactam (ZOSYN)  IV Stopped (12/15/17 0935)  . vancomycin Stopped (12/15/17 1028)    Procedures/Studies: Dg Chest 2 View  Result Date: 12/14/2017 CLINICAL DATA:  Shortness of Breath EXAM: CHEST - 2 VIEW COMPARISON:  3/25/9 FINDINGS: Cardiac shadow is within normal limits. Lungs are hyper aerated bilaterally. Previously seen right basilar atelectatic changes have resolved. No sizable effusion is noted. No bony abnormality is noted. IMPRESSION: Mild COPD.  Previously seen right basilar changes have resolved. Electronically Signed   By: Inez Catalina M.D.   On: 12/14/2017 13:34   Dg Chest 2 View  Result Date: 12/07/2017 CLINICAL DATA:  Initial evaluation for acute fever, shortness of breath. EXAM: CHEST - 2 VIEW COMPARISON:  Prior CT from 11/26/2017. FINDINGS: Cardiac and mediastinal silhouettes are stable in size and contour, and remain within normal limits. Aortic atherosclerosis. Lungs are hyperinflated with changes related COPD. Calcified granuloma projects over the left hemidiaphragm, stable. Patchy and linear right basilar opacity, which may reflect atelectasis and/or infiltrate. No pulmonary edema or pleural effusion. No pneumothorax. No acute osseus abnormality. IMPRESSION: 1. Patchy and linear right basilar opacity, which may reflect atelectasis and/or infiltrate. 2. Underlying  COPD. 3. Aortic atherosclerosis. Electronically Signed   By: Jeannine Boga M.D.   On: 12/07/2017 18:13   Dg Chest 2 View  Result Date: 11/25/2017 CLINICAL DATA:  Dyspnea since Friday. EXAM: CHEST - 2 VIEW COMPARISON:  11/18/2017 FINDINGS: The heart size and mediastinal contours are within normal limits. Minimal aortic atherosclerosis at the arch without aneurysm. Hyperinflated lungs without acute pneumonic consolidation or CHF. Small calcified granuloma at the left lung base. The visualized skeletal structures are unremarkable. IMPRESSION: Hyperinflated lungs consistent with COPD. Calcified granuloma projects over the left lung base. Electronically Signed   By: Ashley Royalty M.D.   On: 11/25/2017 20:13   Dg Chest 2 View  Result Date: 11/18/2017 CLINICAL DATA:  77 year old male with shortness of breath for the past day EXAM: CHEST - 2 VIEW COMPARISON:  Prior chest x-ray 11/03/2016 FINDINGS: Stable cardiac and mediastinal contours. Trace atherosclerotic calcifications present in the transverse aorta. The lungs remain hyperinflated. Increased retrosternal clear space on the lateral view. Stable calcified granuloma in the left lower lobe. No pneumothorax, pleural effusion or pulmonary edema. No acute osseous abnormality. IMPRESSION: 1. Pulmonary hyperinflation suggests underlying COPD. 2. No acute cardiopulmonary process. 3.  Aortic Atherosclerosis (ICD10-170.0) Electronically Signed   By: Jacqulynn Cadet M.D.   On: 11/18/2017 15:51   Ct Head Wo Contrast  Result Date: 12/14/2017 CLINICAL DATA:  Generalized weakness worsening over the last few days. EXAM: CT HEAD WITHOUT CONTRAST TECHNIQUE: Contiguous axial images were obtained from the base of the skull through the vertex without intravenous contrast. COMPARISON:  12/23/2004 FINDINGS: Brain: Mild age related volume loss. Few small areas of low-density in the hemispheric white matter likely represent chronic small vessel disease. No sign of acute  infarction, mass lesion, hemorrhage, hydrocephalus or extra-axial collection. Vascular: There is atherosclerotic  calcification of the major vessels at the base of the brain. Skull: Negative Sinuses/Orbits: Clear/normal Other: None IMPRESSION: No acute finding by CT. Age related atrophy. Mild small vessel change of the hemispheric white matter. Electronically Signed   By: Nelson Chimes M.D.   On: 12/14/2017 20:44   Ct Angio Chest Pe W Or Wo Contrast  Result Date: 11/26/2017 CLINICAL DATA:  Acute onset of shortness of breath. Decreased appetite and chills. EXAM: CT ANGIOGRAPHY CHEST WITH CONTRAST TECHNIQUE: Multidetector CT imaging of the chest was performed using the standard protocol during bolus administration of intravenous contrast. Multiplanar CT image reconstructions and MIPs were obtained to evaluate the vascular anatomy. CONTRAST:  133mL ISOVUE-370 IOPAMIDOL (ISOVUE-370) INJECTION 76% COMPARISON:  Chest radiograph performed 11/25/2017, and CT of the chest performed 04/22/2017 FINDINGS: Cardiovascular:  There is no evidence of pulmonary embolus. The heart is normal in size. Mild calcification is seen along the thoracic aorta and proximal great vessels. Scattered coronary artery calcifications are seen. Mediastinum/Nodes: Trace pericardial fluid remains within normal limits. Visualized mediastinal and hilar nodes are borderline normal in size. The visualized portions of the thyroid gland are unremarkable. No axillary lymphadenopathy is seen. Lungs/Pleura: An 8 mm nodule is noted at the periphery of the right upper lobe (image 64 of 144). This has increased in size from 6 mm on the prior study. Additional scattered nodules are noted within the right upper and lower lobes, and the a 5 mm pleural-based nodule is noted at the left lower lobe (image 100 of 144). Bilateral emphysema is noted. No pleural effusion or pneumothorax is seen. Upper Abdomen: The visualized portions of the liver are unremarkable.  Scattered calcified granulomata are seen within the spleen. The visualized portions of the pancreas and adrenal glands are within normal limits. Musculoskeletal: No acute osseous abnormalities are identified. The visualized musculature is unremarkable in appearance. Review of the MIP images confirms the above findings. IMPRESSION: 1. No evidence of pulmonary embolus. 2. The largest pulmonary nodule measures 8 mm, with a tail extending centrally that is new from August. It is increased in size from 6 mm in August. PET/CT could be considered for further evaluation, to help exclude malignancy. 3. Additional bilateral pulmonary nodules appear relatively stable. 4. Bilateral emphysema noted 5. Scattered coronary artery calcifications. Electronically Signed   By: Garald Balding M.D.   On: 11/26/2017 02:27   US Aorta Complete  Result Date: 11/26/2017 CLINICAL DATA:  AAA (abdominal aortic aneurysm) (HCC) I71.4 (ICD-10-CM) EXAM: ULTRASOUND OF ABDOMINAL AORTA TECHNIQUE: Ultrasound examination of the abdominal aorta was performed to evaluate for abdominal aortic aneurysm. COMPARISON:  Lumbar MRI, 09/02/2013. FINDINGS: Abdominal aortic measurements as follows: Proximal:  2.6 cm Mid:  2.0 cm Distal:  2.7 cm Right common iliac: 1.5 cm Left common iliac: 1.1 cm Distal aortic systolic flow velocity: 71 centimeters/second. IMPRESSION: 1. Distal abdominal aortic aneurysm with a anterior-posterior dimension of 2.7 cm, measured at 2.5 cm on a prior ultrasound report dated 11/06/2006. On the MRI of the lumbar spine, the distal aorta measured 2.9 cm from anterior to posterior on the sagittal view. Ectatic abdominal aorta at risk for aneurysm development. Recommend followup by ultrasound in 5 years. This recommendation follows ACR consensus guidelines: White Paper of the ACR Incidental Findings Committee II on Vascular Findings. J Am Coll Radiol 2013; 10:789-794. Electronically Signed   By: Lajean Manes M.D.   On: 11/26/2017 11:32     Orson Eva, DO  [facility] Pager [phone]  If 7PM-7AM, please contact night-coverage  [email or web address] Password [email or web address] 12/15/2017, 12:07 PM   LOS: 1 day

## 2017-12-15 NOTE — Progress Notes (Signed)
Inpatient Diabetes Program Recommendations  AACE/ADA: New Consensus Statement on Inpatient Glycemic Control (2015)  Target Ranges:  Prepandial:   less than 140 mg/dL      Peak postprandial:   less than 180 mg/dL (1-2 hours)      Critically ill patients:  140 - 180 mg/dL   Results for STYLES, FAMBRO (MRN [id number]) as of 12/15/2017 08:16  Ref. Range 12/14/2017 13:52 12/15/2017 05:36  Glucose Latest Ref Range: 65 - 99 mg/dL 107 (H) 254 (H)   Review of Glycemic Control  Diabetes history: No Outpatient Diabetes medications: NA Current orders for Inpatient glycemic control: None  Inpatient Diabetes Program Recommendations: Correction (SSI): While inpatient and ordered steroids, please consider ordering CBGs with Novolog correction scale ACHS.  Thanks, Barnie Alderman, RN, MSN, CDE Diabetes Coordinator Inpatient Diabetes Program [phone] (Team Pager from 8am to 5pm)

## 2017-12-15 NOTE — Evaluation (Signed)
Physical Therapy Evaluation Patient Details Name: [name] [name] MRN: [id number] DOB: January 08, 1941 Today's Date: 12/15/2017   History of Present Illness  [name] [name] is a 77yo male who comes to [facility] on 4/1 c dyspnea, weakness, found to be hypotensive at doctor and sent to ED. Pt was at [facility] 1 week prior for HCAP. PMH: HTN, HLD, AAA, PAF, COPD, PrCA. Pt reports PTA to had Supplemental O2 at home, but it was for PRN use only. Pt did not transition to prednisone s/p DC hence attending following for potential hormonal dysfunciton as well.   Clinical Impression  Pt admitted with above diagnosis. Pt currently with functional limitations due to the deficits listed below (see "PT Problem List"). Upon entry, the patient is received semirecumbent in bed, daughter and wife present. Of note bed is soaked in sweat, pt denying chills today, but some last night.  Pt received on 2LPM O2 c SpO2 >94%, HR 101BPM: at current rate, PT AMB 93ft with HR: 130s, SpO2: 97% with significant increased SOB. Pt left seated in chair with HR: 118-120BPM even after 5 minutes rest. Functional mobility assessment demonstrates severe acute strength impairment globally, the pt now requiring heavy effort to perform bed mobility and transfers, and gait is limited to 22ft, whereas he AMB >348ft with PT 5DA while admitted at [facility]. Empirically, the patient demonstrates increased risk of recurrent falls AEB gait speed <0.34m/s, forward reach <5", and multiple LOB demonstrated throughout session. Pt will benefit from skilled PT intervention to increase independence and safety with basic mobility in preparation for discharge to the venue listed below.       Follow Up Recommendations Home health PT;Supervision for mobility/OOB    Equipment Recommendations  Rolling walker with 5" wheels    Recommendations for Other Services       Precautions / Restrictions Precautions Precautions: Fall Restrictions Weight Bearing Restrictions: No       Mobility  Bed Mobility Overal bed mobility: Modified Independent             General bed mobility comments: additional time needed d/t weakness  Transfers Overall transfer level: Needs assistance Equipment used: Rolling walker (2 wheeled) Transfers: Sit to/from Stand Sit to Stand: Min guard         General transfer comment: pt reports legs feel weak and rubbery   Ambulation/Gait Ambulation/Gait assistance: Min guard Ambulation Distance (Feet): 10 Feet Assistive device: Rolling walker (2 wheeled)       General Gait Details: on 2L/min O2, significant increased Dyspnea upon AMB without desaturation (97% spO2) but HR noted to be 130s. Pt denies CP, but has been diaphoretic all day. Pt reports legs are rubbery and weak   Stairs            Wheelchair Mobility    Modified Rankin (Stroke Patients Only)       Balance Overall balance assessment: Mild deficits observed, not formally tested;Needs assistance Sitting-balance support: No upper extremity supported;Feet supported Sitting balance-Leahy Scale: Good     Standing balance support: During functional activity;Bilateral upper extremity supported Standing balance-Leahy Scale: Poor                               Pertinent Vitals/Pain Pain Assessment: No/denies pain    Home Living Family/patient expects to be discharged to:: Private residence Living Arrangements: Spouse/significant other Available Help at Discharge: Family;Available 24 hours/day Type of Home: House Home Access: Stairs to enter Entrance Stairs-Rails: Right;Left;Can  reach both Entrance Stairs-Number of Steps: 4 Home Layout: One level Home Equipment: Shower seat      Prior Function Level of Independence: Independent               Hand Dominance   Dominant Hand: Right    Extremity/Trunk Assessment        Lower Extremity Assessment Lower Extremity Assessment: Generalized weakness    Cervical / Trunk  Assessment Cervical / Trunk Assessment: Kyphotic  Communication   Communication: No difficulties  Cognition Arousal/Alertness: Awake/alert Behavior During Therapy: WFL for tasks assessed/performed Overall Cognitive Status: Within Functional Limits for tasks assessed                                        General Comments      Exercises     Assessment/Plan    PT Assessment Patient needs continued PT services  PT Problem List Decreased mobility;Cardiopulmonary status limiting activity;Decreased activity tolerance;Decreased balance       PT Treatment Interventions Therapeutic activities;Gait training;Therapeutic exercise;Patient/family education;Stair training;Balance training;Functional mobility training    PT Goals (Current goals can be found in the Care Plan section)  Acute Rehab PT Goals Patient Stated Goal: get stronger PT Goal Formulation: With patient Time For Goal Achievement: 12/29/17 Potential to Achieve Goals: Good    Frequency Min 3X/week   Barriers to discharge        Co-evaluation               AM-PAC PT "6 Clicks" Daily Activity  Outcome Measure Difficulty turning over in bed (including adjusting bedclothes, sheets and blankets)?: A Little Difficulty moving from lying on back to sitting on the side of the bed? : A Little Difficulty sitting down on and standing up from a chair with arms (e.g., wheelchair, bedside commode, etc,.)?: A Lot Help needed moving to and from a bed to chair (including a wheelchair)?: A Lot Help needed walking in hospital room?: A Lot Help needed climbing 3-5 steps with a railing? : Total 6 Click Score: 13    End of Session Equipment Utilized During Treatment: Gait belt Activity Tolerance: Patient limited by fatigue;No increased pain;Treatment limited secondary to medical complications (Comment)(DOE without desaturation, tachycardia. ) Patient left: with call bell/phone within reach;in chair;with  family/visitor present   PT Visit Diagnosis: Difficulty in walking, not elsewhere classified (R26.2);Other abnormalities of gait and mobility (R26.89);Unsteadiness on feet (R26.81);Muscle weakness (generalized) (M62.81)    Time: 9485-4627 PT Time Calculation (min) (ACUTE ONLY): 21 min   Charges:   PT Evaluation $PT Eval Moderate Complexity: 1 Mod     PT G Codes:        3:37 PM, 2017/12/20 Etta Grandchild, PT, DPT Physical Therapist at [place] [phone] (office)     Buccola,Allan C 12/20/17, 3:32 PM

## 2017-12-15 NOTE — Care Management Note (Signed)
Case Management Note  Patient Details  Name: [name] [name] MRN: [id number] Date of Birth: 22-Sep-1940  Subjective/Objective:   Adm with sepsis. From home, ind with ADL's. Inquires about RW. Has oxygen with [facility]. Reports his PCP has sent an order for a "portable concentrator". Discussed with [name] of [facility] who will follow up. Anticipate PT eval prior to DC. Patient reports he would be agreeable to home health if recommended.                 Action/Plan: DC home. CM following for needs.    Expected Discharge Date:  12/17/17               Expected Discharge Plan:     In-House Referral:     Discharge planning Services  CM Consult  Post Acute Care Choice:  Durable Medical Equipment Choice offered to:     DME Arranged:    DME Agency:     HH Arranged:    HH Agency:     Status of Service:  In process, will continue to follow  If discussed at Long Length of Stay Meetings, dates discussed:    Additional Comments:  [name], [name], RN 12/15/2017, 1:26 PM

## 2017-12-15 NOTE — Progress Notes (Signed)
Initial Nutrition Assessment  DOCUMENTATION CODES:      INTERVENTION:  Ensure Enlive po BID, each supplement provides 350 kcal and 20 grams of protein   Heart Healthy diet    NUTRITION DIAGNOSIS:  Moderate Malnutrition  related to poor appetite, acute on chronic COPD exacerbation as evidenced by pt reported intake past 3-4 weeks, unwanted wt loss and moderate muscle and fat  loss.  GOAL:  Pt to meet >/= 90% of their estimated nutrition needs      MONITOR:  Po intake, labs and wt trends     REASON FOR ASSESSMENT:   Malnutrition Screening Tool    ASSESSMENT:  The patient is a 77 yo male former smoker from home with severe COPD, HF and prostate cancer. He has been feeling poorly the past month. On 3/12- he presented to MD nauseated and c/o poor appetite. He was admitted to [facility] (3-13 to 3-16)  COPD exacerbation. F/u with MD 3/22- pt still c/o poor appetite and po intake, weakness, wt loss. 3/25 he presents to pulmonology for f/u suspected pneumonia and sepsis. He was  was sent on to [facility]- and admitted from 3-25 to 3- 28  (PNA) . Yesterday he contacted his primary care for acute visit and after MD assessment has returned to hospital for further treatment.  His appetite has been poor as previously noted however pt is mindful that he "needs to eat and drink something". He eats cold cereal or oatmeal for breakfast most days eats out sometimes [employer] (chicken and biscuit) or [employer] (salad).   His weight has decreased since December from a weight of 167 lb to 153.5 lb (8%). Usual weight around 165 lb. The patient says he did weight 180 lb as few years ago.   Labs reviewed: No results found for: VITAMINB12   CBC Latest Ref Rng & Units 12/15/2017 12/14/2017 12/10/2017  WBC 4.0 - 10.5 K/uL 8.1 19.8(H) 17.1(H)  Hemoglobin 13.0 - 17.0 g/dL 13.8 14.9 11.9(L)  Hematocrit 39.0 - 52.0 % 40.1 45.2 35.4(L)  Platelets 150 - 400 K/uL 173 183 206   Medications reviewed: prednisone, zosyn,  vancomycin  NUTRITION - FOCUSED PHYSICAL EXAM: Patient has moderate orbital and tricep fat depletion, moderate muscle loss clavicle and anterior thigh. NO edema.    Diet Order:  Diet Heart Room service appropriate? Yes; Fluid consistency: Thin  EDUCATION NEEDS:     Skin:  Skin Assessment: Reviewed RN Assessment  Last BM:  4/1  Height:   Ht Readings from Last 1 Encounters:  12/14/17 5\' 10"  (1.778 m)    Weight:   Wt Readings from Last 1 Encounters:  12/14/17 153 lb 7 oz (69.6 kg)    Ideal Body Weight:   75 kg  BMI:  Body mass index is 22.02 kg/m.  Estimated Nutritional Needs:   Kcal:  2000-2200 kcal  Protein:  95-105 gr   Fluid:  < 2 liters daily  Colman Cater MS,RD,CSG,LDN Office: [phone] Pager: [phone]

## 2017-12-16 ENCOUNTER — Encounter: Payer: Medicare Other | Admitting: Family Medicine

## 2017-12-16 DIAGNOSIS — R531 Weakness: Secondary | ICD-10-CM

## 2017-12-16 DIAGNOSIS — R7989 Other specified abnormal findings of blood chemistry: Secondary | ICD-10-CM

## 2017-12-16 DIAGNOSIS — I714 Abdominal aortic aneurysm, without rupture: Secondary | ICD-10-CM

## 2017-12-16 DIAGNOSIS — J9621 Acute and chronic respiratory failure with hypoxia: Secondary | ICD-10-CM

## 2017-12-16 LAB — CBC
HCT: 33.7 % — ABNORMAL LOW (ref 39.0–52.0)
Hemoglobin: 11.5 g/dL — ABNORMAL LOW (ref 13.0–17.0)
MCH: 30 pg (ref 26.0–34.0)
MCHC: 34.1 g/dL (ref 30.0–36.0)
MCV: 88 fL (ref 78.0–100.0)
Platelets: 175 10*3/uL (ref 150–400)
RBC: 3.83 MIL/uL — ABNORMAL LOW (ref 4.22–5.81)
RDW: 14.6 % (ref 11.5–15.5)
WBC: 15.2 10*3/uL — ABNORMAL HIGH (ref 4.0–10.5)

## 2017-12-16 LAB — BASIC METABOLIC PANEL
Anion gap: 11 (ref 5–15)
BUN: 30 mg/dL — AB (ref 6–20)
CALCIUM: 9 mg/dL (ref 8.9–10.3)
CO2: 20 mmol/L — ABNORMAL LOW (ref 22–32)
Chloride: 110 mmol/L (ref 101–111)
Creatinine, Ser: 1.07 mg/dL (ref 0.61–1.24)
GFR calc Af Amer: >60 mL/min (ref >60)
GLUCOSE: 297 mg/dL — AB (ref 65–99)
Potassium: 3.6 mmol/L (ref 3.5–5.1)
Sodium: 141 mmol/L (ref 135–145)

## 2017-12-16 LAB — TSH: TSH: 0.177 u[IU]/mL — AB (ref 0.350–4.500)

## 2017-12-16 LAB — T4, FREE: Free T4: 1.39 ng/dL — ABNORMAL HIGH (ref 0.61–1.12)

## 2017-12-16 LAB — LACTIC ACID, PLASMA
LACTIC ACID, VENOUS: 3.2 mmol/L — AB (ref 0.5–1.9)
Lactic Acid, Venous: 4.1 mmol/L (ref 0.5–1.9)

## 2017-12-16 LAB — VITAMIN B12: VITAMIN B 12: 207 pg/mL (ref 180–914)

## 2017-12-16 MED ORDER — METHYLPREDNISOLONE SODIUM SUCC 40 MG IJ SOLR
40.0000 mg | INTRAMUSCULAR | Status: DC
  Administered 2017-12-17: 40 mg via INTRAVENOUS
  Filled 2017-12-16: qty 1

## 2017-12-16 MED ORDER — CYANOCOBALAMIN 1000 MCG/ML IJ SOLN: 1000.0000 ug | Freq: Once | INTRAMUSCULAR | Status: DC

## 2017-12-16 MED ORDER — POLYVINYL ALCOHOL 1.4 % OP SOLN
1.0000 [drp] | OPHTHALMIC | Status: DC | PRN
  Administered 2017-12-16: 1 [drp] via OPHTHALMIC
  Filled 2017-12-16: qty 15

## 2017-12-16 NOTE — Progress Notes (Signed)
PROGRESS NOTE    [name] [name]  HFW:[id number] DOB: 1940/09/20 DOA: 12/14/2017 PCP: [name], MD     Brief Narrative:  77 year old man admitted from home on 4/1 due to generalized weakness and increasing shortness of breath.  He has a history of COPD and had recently been discharged from the hospital after being treated for hospital-acquired pneumonia and a COPD exacerbation.  He had been sent home with a prednisone taper which he did not take.  Upon presentation he was found to be hypotensive with a systolic blood pressure in the 80s, also had a lactic acid of 3.5 and a WBC count of 19, because of this admission was requested.   Assessment & Plan:   Principal Problem:   Sepsis ([place]) Active Problems:   ATRIAL FIBRILLATION, PAROXYSMAL   Bladder neck contracture   COPD, severe (HCC)   Abdominal aortic aneurysm (AAA) without rupture (HCC)   COPD with acute exacerbation (HCC)   Tachycardia   SIRS (systemic inflammatory response syndrome) (HCC)   Acute on chronic respiratory failure with hypoxia (HCC)   Elevated lactic acid level   Hypotension   SIRS -Suspect this is due to noninfectious causes. -Likely related to abrupt cessation of steroids as he neglected to fill and take his prednisone prescription after being treated with high-dose Solu-Medrol while hospitalized. -Also dehydration was likely at play. -No source of infection has been evident. -We will discontinue all antibiotics today and observe for another 24 hours.  As long as remains afebrile anticipate discharge home in 24 hours. -Recheck lactic acid in a.m.  Acute on chronic hypoxemic respiratory failure -Likely very mild COPD exacerbation. -We will continue steroid titration, continue home O2.  Generalized weakness -Suspect due to volume depletion, deconditioning as well as a degree of relative adrenal insufficiency due to abrupt discontinuation of steroids, doubt sepsis as a potential etiology. -PT has  recommended home health, will be arranged at time of discharge. -B12 level is only 207 which is very low normal.  Will give IM B12 today and tomorrow. -TSH is low, check free T3 and free T4  Paroxysmal atrial fibrillation -Continue apixaban for anticoagulation and Cardizem for rate control.  AAA -Abdominal ultrasound from March 2019 shows a diameter of 2.7 cm that is up from 2.5 cm in 2008. -At this point will recommend yearly surveillance.   DVT prophylaxis: Eliquis Code Status: Full code Family Communication: Wife at bedside updated on plan of care and all questions answered Disposition Plan: Home when ready, anticipate 24 hours  Consultants:   None  Procedures:   None  Antimicrobials:  Anti-infectives (From admission, onward)   Start     Dose/Rate Route Frequency Ordered Stop   12/14/17 2200  vancomycin (VANCOCIN) IVPB 750 mg/150 ml premix  Status:  Discontinued     750 mg 150 mL/hr over 60 Minutes Intravenous Every 12 hours 12/14/17 1645 12/16/17 0937   12/14/17 2000  piperacillin-tazobactam (ZOSYN) IVPB 3.375 g  Status:  Discontinued     3.375 g 12.5 mL/hr over 240 Minutes Intravenous Every 8 hours 12/14/17 1645 12/16/17 0937   12/14/17 1315  piperacillin-tazobactam (ZOSYN) IVPB 3.375 g     3.375 g 100 mL/hr over 30 Minutes Intravenous  Once 12/14/17 1308 12/14/17 1420   12/14/17 1315  vancomycin (VANCOCIN) IVPB 1000 mg/200 mL premix     1,000 mg 200 mL/hr over 60 Minutes Intravenous  Once 12/14/17 1308 12/14/17 1525       Subjective: Feels much improved other than  generalized weakness.  Still little short of breath with ambulation.  Objective: Vitals:   12/16/17 0749 12/16/17 0755 12/16/17 1439 12/16/17 1504  BP:    123/73  Pulse:    (!) 104  Resp:    18  Temp:    97.8 F (36.6 C)  TempSrc:    Oral  SpO2: 97% 100% 95% 97%  Weight:      Height:        Intake/Output Summary (Last 24 hours) at 12/16/2017 1658 Last data filed at 12/16/2017 1026 Gross per  24 hour  Intake 640 ml  Output -  Net 640 ml   Filed Weights   12/14/17 1249 12/14/17 1810  Weight: 72.6 kg (160 lb) 69.6 kg (153 lb 7 oz)    Examination:  General exam: Alert, awake, oriented x 3 Respiratory system: Clear to auscultation. Respiratory effort normal. Cardiovascular system:RRR. No murmurs, rubs, gallops. Gastrointestinal system: Abdomen is nondistended, soft and nontender. No organomegaly or masses felt. Normal bowel sounds heard. Central nervous system: Alert and oriented. No focal neurological deficits. Extremities: No C/C/E, +pedal pulses Skin: No rashes, lesions or ulcers Psychiatry: Judgement and insight appear normal. Mood & affect appropriate.     Data Reviewed: I have personally reviewed following labs and imaging studies  CBC: Recent Labs  Lab 12/10/17 0403 12/14/17 1352 12/15/17 0536 12/16/17 0443  WBC 17.1* 19.8* 8.1 15.2*  NEUTROABS  --  17.5*  --   --   HGB 11.9* 14.9 13.8 11.5*  HCT 35.4* 45.2 40.1 33.7*  MCV 89.4 90.6 87.4 88.0  PLT 206 183 173 269   Basic Metabolic Panel: Recent Labs  Lab 12/10/17 0403 12/14/17 1352 12/15/17 0536 12/16/17 0443  NA 146* 138 139 141  K 3.4* 4.0 3.8 3.6  CL 113* 100* 107 110  CO2 26 21* 19* 20*  GLUCOSE 147* 107* 254* 297*  BUN 40* 26* 24* 30*  CREATININE 0.86 1.21 1.08 1.07  CALCIUM 8.6* 8.8* 8.6* 9.0   GFR: Estimated Creatinine Clearance: 57.8 mL/min (by C-G formula based on SCr of 1.07 mg/dL). Liver Function Tests: Recent Labs  Lab 12/14/17 1352 12/15/17 0536  AST 20 31  ALT 29 29  ALKPHOS 60 56  BILITOT 2.4* 1.4*  1.6*  PROT 7.3 6.5  ALBUMIN 3.2* 2.7*   No results for input(s): LIPASE, AMYLASE in the last 168 hours. No results for input(s): AMMONIA in the last 168 hours. Coagulation Profile: No results for input(s): INR, PROTIME in the last 168 hours. Cardiac Enzymes: Recent Labs  Lab 12/14/17 1334 12/14/17 1749 12/14/17 2344 12/15/17 0536  TROPONINI <0.03 <0.03 <0.03  <0.03   BNP (last 3 results) No results for input(s): PROBNP in the last 8760 hours. HbA1C: No results for input(s): HGBA1C in the last 72 hours. CBG: No results for input(s): GLUCAP in the last 168 hours. Lipid Profile: No results for input(s): CHOL, HDL, LDLCALC, TRIG, CHOLHDL, LDLDIRECT in the last 72 hours. Thyroid Function Tests: Recent Labs    12/16/17 0443  TSH 0.177*   Anemia Panel: Recent Labs    12/16/17 0443  VITAMINB12 207   Urine analysis:    Component Value Date/Time   COLORURINE YELLOW 12/14/2017 1600   APPEARANCEUR CLEAR 12/14/2017 1600   LABSPEC 1.009 12/14/2017 1600   PHURINE 6.0 12/14/2017 1600   GLUCOSEU NEGATIVE 12/14/2017 1600   HGBUR SMALL (A) 12/14/2017 1600   BILIRUBINUR NEGATIVE 12/14/2017 1600   KETONESUR 20 (A) 12/14/2017 1600   PROTEINUR NEGATIVE 12/14/2017 1600  NITRITE NEGATIVE 12/14/2017 1600   LEUKOCYTESUR NEGATIVE 12/14/2017 1600   Sepsis Labs: @LABRCNTIP (procalcitonin:4,lacticidven:4)  ) Recent Results (from the past 240 hour(s))  Blood Culture (routine x 2)     Status: None   Collection Time: 12/07/17  5:30 PM  Result Value Ref Range Status   Specimen Description   Final    BLOOD LEFT ANTECUBITAL Performed at [facility] [address], [place], [place] [address]    Special Requests   Final    BOTTLES DRAWN AEROBIC AND ANAEROBIC Blood Culture results may not be optimal due to an excessive volume of blood received in culture bottles Performed at [facility] [address] [place], [place] [address]    Culture   Final    NO GROWTH 5 DAYS Performed at [facility], [place] [address] [place], [place] [address]    Report Status 12/12/2017 FINAL  Final  Blood Culture (routine x 2)     Status: None   Collection Time: 12/07/17  5:41 PM  Result Value Ref Range Status   Specimen Description   Final    BLOOD RIGHT FOREARM Performed at [facility] [address] [place], [place] [address]    Special Requests   Final    BOTTLES DRAWN AEROBIC AND ANAEROBIC Blood Culture results may not be optimal due to an excessive volume of blood received in culture bottles Performed at [facility] [address] [place], [place] [address]    Culture   Final    NO GROWTH 5 DAYS Performed at [facility], [place] [address], [place], [place] [address]    Report Status 12/12/2017 FINAL  Final  Culture, sputum-assessment     Status: None   Collection Time: 12/08/17 10:13 AM  Result Value Ref Range Status   Specimen Description SPUTUM  Final   Special Requests NONE  Final   Sputum evaluation   Final    THIS SPECIMEN IS ACCEPTABLE FOR SPUTUM CULTURE Performed at [facility], [place] [address], [place], [place] [address]    Report Status 12/08/2017 FINAL  Final  Culture, respiratory (NON-Expectorated)     Status: None   Collection Time: 12/08/17 10:13 AM  Result Value Ref Range Status   Specimen Description   Final    SPUTUM Performed at [facility] [address], [place], [place] [address]    Special Requests   Final    NONE Reflexed from [id number] Performed at [facility], [place] [address], [place],  [address]    Gram Stain   Final    RARE WBC PRESENT, PREDOMINANTLY PMN FEW SQUAMOUS EPITHELIAL CELLS PRESENT FEW GRAM POSITIVE COCCI    Culture   Final    Consistent with normal respiratory flora. Performed at [facility], [place] [address], [place],  [address]    Report Status 12/10/2017 FINAL  Final  MRSA PCR Screening     Status: None   Collection Time: 12/08/17  3:36 PM  Result Value Ref Range Status   MRSA by PCR NEGATIVE NEGATIVE Final    Comment:        The GeneXpert MRSA Assay (FDA approved for NASAL specimens only), is one component of a comprehensive MRSA colonization surveillance program. It is not intended to diagnose MRSA infection nor to  guide or monitor treatment for MRSA infections. Performed at [facility], [place] [address] [place], [name]  [address]   Blood culture (routine x 2)     Status: None (Preliminary result)   Collection Time: 12/14/17  1:08 PM  Result Value Ref Range Status   Specimen Description LEFT ANTECUBITAL  Final   Special Requests   Final    BOTTLES DRAWN AEROBIC AND ANAEROBIC Blood Culture adequate volume   Culture   Final    NO GROWTH 2 DAYS Performed at [facility], [address], [place], [place] [address]    Report Status PENDING  Incomplete  Blood culture (routine x 2)     Status: None (Preliminary result)   Collection Time: 12/14/17  1:13 PM  Result Value Ref Range Status   Specimen Description RIGHT ANTECUBITAL  Final   Special Requests   Final    BOTTLES DRAWN AEROBIC AND ANAEROBIC Blood Culture results may not be optimal due to an excessive volume of blood received in culture bottles   Culture   Final    NO GROWTH 2 DAYS Performed at [facility], [address] [place], [place] [address]    Report Status PENDING  Incomplete         Radiology Studies: Ct Head Wo Contrast  Result Date: 12/14/2017 CLINICAL DATA:  Generalized weakness worsening over the last few days. EXAM: CT HEAD WITHOUT CONTRAST TECHNIQUE: Contiguous axial images were obtained from the base of the skull through the vertex without intravenous contrast. COMPARISON:  12/23/2004 FINDINGS: Brain: Mild age related volume loss. Few small areas of low-density in the hemispheric white matter likely represent chronic small vessel disease. No sign of acute infarction, mass lesion, hemorrhage, hydrocephalus or extra-axial collection. Vascular: There is atherosclerotic calcification of the major vessels at the base of the brain. Skull: Negative Sinuses/Orbits: Clear/normal Other: None IMPRESSION: No acute finding by CT. Age related atrophy. Mild small vessel change of the hemispheric white matter.  Electronically Signed   By: Nelson Chimes M.D.   On: 12/14/2017 20:44        Scheduled Meds: . apixaban  5 mg Oral BID  . budesonide (PULMICORT) nebulizer solution  0.5 mg Nebulization BID  . diltiazem  240 mg Oral Daily   And  . diltiazem  120 mg Oral QHS  . feeding supplement (ENSURE ENLIVE)  237 mL Oral BID BM  . ipratropium  0.5 mg Nebulization TID  . levalbuterol  1.25 mg Nebulization TID  . [START ON 12/17/2017] methylPREDNISolone (SOLU-MEDROL) injection  40 mg Intravenous Q24H  . pregabalin  150 mg Oral TID   Continuous Infusions: . 0.9 % NaCl with KCl 20 mEq / L 75 mL/hr at 12/16/17 1520     LOS: 2 days    Time spent: 35 minutes. Greater than 50% of this time was spent in direct contact with the patient coordinating care.     Lelon Frohlich, MD [facility] Pager [phone]  If 7PM-7AM, please contact night-coverage [email or web address] Password [facility] 12/16/2017, 4:58 PM

## 2017-12-16 NOTE — Progress Notes (Signed)
Patient's redrawn lactic was 3.2. Night time nurse Cristopher Peru text paged Dr. Jerilee Hoh the new results.  Received no new orders at this time.

## 2017-12-16 NOTE — Progress Notes (Signed)
Lab called with critical lactic acid of 4.1.  Vitals are stable and show no sign of sepsis.  Dr. Manuella Ghazi paed and repeat lactic acid was ordered.  Once result for second lactic is in, then we will page MD.

## 2017-12-16 NOTE — Progress Notes (Signed)
Inpatient Diabetes Program Recommendations  AACE/ADA: New Consensus Statement on Inpatient Glycemic Control (2015)  Target Ranges:  Prepandial:   less than 140 mg/dL      Peak postprandial:   less than 180 mg/dL (1-2 hours)      Critically ill patients:  140 - 180 mg/dL   Results for [name] [name], [name] [name] (MRN [id number]) as of 12/16/2017 07:57  Ref. Range 12/14/2017 13:52 12/15/2017 05:36 12/16/2017 04:43  Glucose Latest Ref Range: 65 - 99 mg/dL 107 (H) 254 (H) 297 (H)   Review of Glycemic Control  Diabetes history: No Outpatient Diabetes medications: NA Current orders for Inpatient glycemic control: None  Inpatient Diabetes Program Recommendations: Correction (SSI): While inpatient and ordered steroids, please consider ordering CBGs with Novolog correction scale ACHS.  Thanks, Barnie Alderman, RN, MSN, CDE Diabetes Coordinator Inpatient Diabetes Program [phone] (Team Pager from 8am to 5pm)

## 2017-12-17 DIAGNOSIS — A419 Sepsis, unspecified organism: Secondary | ICD-10-CM

## 2017-12-17 LAB — BASIC METABOLIC PANEL
Anion gap: 9 (ref 5–15)
BUN: 32 mg/dL — AB (ref 6–20)
CALCIUM: 8.8 mg/dL — AB (ref 8.9–10.3)
CO2: 23 mmol/L (ref 22–32)
CREATININE: 0.72 mg/dL (ref 0.61–1.24)
Chloride: 115 mmol/L — ABNORMAL HIGH (ref 101–111)
GFR calc Af Amer: >60 mL/min (ref >60)
GFR calc non Af Amer: >60 mL/min (ref >60)
GLUCOSE: 143 mg/dL — AB (ref 65–99)
Potassium: 4.1 mmol/L (ref 3.5–5.1)
Sodium: 147 mmol/L — ABNORMAL HIGH (ref 135–145)

## 2017-12-17 LAB — CBC
HCT: 31.3 % — ABNORMAL LOW (ref 39.0–52.0)
HEMOGLOBIN: 10.4 g/dL — AB (ref 13.0–17.0)
MCH: 30.1 pg (ref 26.0–34.0)
MCHC: 33.2 g/dL (ref 30.0–36.0)
MCV: 90.5 fL (ref 78.0–100.0)
Platelets: 171 10*3/uL (ref 150–400)
RBC: 3.46 MIL/uL — ABNORMAL LOW (ref 4.22–5.81)
RDW: 15 % (ref 11.5–15.5)
WBC: 14.8 10*3/uL — ABNORMAL HIGH (ref 4.0–10.5)

## 2017-12-17 LAB — T3, FREE: T3, Free: 2 pg/mL (ref 2.0–4.4)

## 2017-12-17 LAB — LACTIC ACID, PLASMA: LACTIC ACID, VENOUS: 2.8 mmol/L — AB (ref 0.5–1.9)

## 2017-12-17 MED ORDER — POLYVINYL ALCOHOL 1.4 % OP SOLN: 1.0000 [drp] | OPHTHALMIC | 0 refills | Status: DC | PRN

## 2017-12-17 MED ORDER — METHIMAZOLE 5 MG PO TABS
5.0000 mg | ORAL_TABLET | Freq: Two times a day (BID) | ORAL | Status: DC
  Administered 2017-12-17: 5 mg via ORAL
  Filled 2017-12-17: qty 1

## 2017-12-17 MED ORDER — METHIMAZOLE 5 MG PO TABS: 5.0000 mg | ORAL_TABLET | Freq: Two times a day (BID) | ORAL | 2 refills | Status: DC

## 2017-12-17 MED ORDER — PREDNISONE 10 MG PO TABS: 10.0000 mg | ORAL_TABLET | Freq: Every day | ORAL | Status: DC

## 2017-12-17 NOTE — Discharge Summary (Addendum)
Physician Discharge Summary  [name] [name] OVZ:[id number] DOB: 1941/01/24 DOA: 12/14/2017  PCP: [name], MD  Admit date: 12/14/2017 Discharge date: 12/17/2017  Time spent: 45 minutes  Recommendations for Outpatient Follow-up:  -To be discharged home today. -Advised to follow-up with primary care provider in 2 weeks. -We will need to continue prednisone taper as prescribed upon discharge, I have stressed the importance of this to both patient and his wife at bedside.  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 Maudie Mercury on 4/1:  [name] [name]  is a 77 y.o. male, w hypertension, hyperlipidemia,  AAA, Pafib, Copd  H/o prostate cancer who apparently presents with c/o Dyspnea, generalized weakness .  Pt denies fever, chills, cough, cp, palp, n/v, diarrhea, brbpr, dysuria.  pt was seen by Dr. Meda Coffee today and noted to be hypotensive and sent to ER for evaluation.   In ED,   CXR IMPRESSION: Mild COPD. Previously seen right basilar changes have resolved.  Trop negative Wbc 19.8, Hgb 14.9, Plt 183  Na 138, K 4.0, Bun 26, Creatinine 1.21  Pt will be admitted for sepsis, Copd exacerbation   Hospital Course:   SIRS -Suspect this is due to noninfectious causes. -Likely related to abrupt cessation of steroids as he neglected to fill and take his prednisone prescription after being treated with  high-dose Solu-Medrol while hospitalized. -Also dehydration was likely at play. -No source of infection has been evident. -No antibiotics to be prescribed on discharge.  Acute on chronic hypoxemic respiratory failure -Likely very mild COPD exacerbation. -We will continue steroid titration, continue home O2.  Generalized weakness -Suspect due to volume depletion, deconditioning as well as a degree of relative adrenal insufficiency due to abrupt discontinuation of steroids, doubt sepsis as a potential etiology. -PT has recommended home health, will be arranged at time of discharge. -B12 level is only 207 which is very low normal.  Received IM x 1.  Paroxysmal atrial fibrillation -Continue apixaban for anticoagulation and Cardizem for rate control.  AAA -Abdominal ultrasound from March 2019 shows a diameter of 2.7 cm that is up from 2.5 cm in 2008. -At this point will recommend yearly surveillance.  Hyperthyroidism -TSH low at 0.177 and free T4 is high at 1.39. -Have started low-dose Tapazole 5 mg twice daily. -We will need repeat thyroid function tests in 4-6 weeks.     Procedures:  None   Consultations:  None  Discharge Instructions  Discharge Instructions    Diet - low sodium heart healthy   Complete by:  As directed    Increase activity slowly   Complete by:  As directed      Allergies as of 12/17/2017      Reactions   Albuterol Other (See Comments)   Caused extreme tachycardia. If used by continuous infusion.  May use the inhaler      Medication List  STOP taking these medications   ACAPELLA Misc     TAKE these medications   AEROCHAMBER MV inhaler Use as instructed   apixaban 5 MG Tabs tablet Commonly known as:  ELIQUIS Take 1 tablet (5 mg total) by mouth 2 (two) times daily.   budesonide-formoterol 160-4.5 MCG/ACT inhaler Commonly known as:  SYMBICORT Inhale 2 puffs into the lungs 2 (two) times daily.   diltiazem 120 MG 24 hr  capsule Commonly known as:  DILT-XR TAKE 2 CAPSULES BY MOUTH ONCE DAILY IN THE MORNING AND 1 CAPSULE ONCE DAILY IN THE EVENING What changed:    how much to take  how to take this  when to take this  additional instructions   ipratropium 0.02 % nebulizer solution Commonly known as:  ATROVENT Take 2.5 mLs (0.5 mg total) by nebulization every 6 (six) hours as needed for wheezing or shortness of breath.   LYRICA 150 MG capsule Generic drug:  pregabalin TAKE ONE CAPSULE BY MOUTH THREE TIMES DAILY   methimazole 5 MG tablet Commonly known as:  TAPAZOLE Take 1 tablet (5 mg total) by mouth 2 (two) times daily.   Oxycodone HCl 10 MG Tabs Take 10 mg by mouth every 6 (six) hours as needed (pain).   polyvinyl alcohol 1.4 % ophthalmic solution Commonly known as:  LIQUIFILM TEARS Place 1 drop into both eyes as needed for dry eyes.   predniSONE 10 MG tablet Commonly known as:  DELTASONE Take 1 tablet (10 mg total) by mouth daily with breakfast. Take 6 tablets today and then decrease by 1 tablet daily until none are left.   SPIRIVA RESPIMAT 2.5 MCG/ACT Aers Generic drug:  Tiotropium Bromide Monohydrate INHALE TWO PUFFS BY MOUTH ONCE DAILY            Durable Medical Equipment  (From admission, onward)        Start     Ordered   12/17/17 1129  For home use only DME Walker rolling  [facility])  Once    Question:  Patient needs a walker to treat with the following condition  Answer:  Physical deconditioning   12/17/17 1128   12/17/17 1015  For home use only DME Walker rolling  Once    Question:  Patient needs a walker to treat with the following condition  Answer:  Weakness of both lower extremities   12/17/17 1018     Allergies  Allergen Reactions  . Albuterol Other (See Comments)    Caused extreme tachycardia. If used by continuous infusion.  May use the inhaler   Follow-up Information    [name], MD. Schedule an appointment as soon as possible for a visit in 2  week(s).   Specialty:  Family Medicine Contact information: [address] [address] [phone]            The results of significant diagnostics from this hospitalization (including imaging, microbiology, ancillary and laboratory) are listed below for reference.    Significant Diagnostic Studies: Dg Chest 2 View  Result Date: 12/14/2017 CLINICAL DATA:  Shortness of Breath EXAM: CHEST - 2 VIEW COMPARISON:  3/25/9 FINDINGS: Cardiac shadow is within normal limits. Lungs are hyper aerated bilaterally. Previously seen right basilar atelectatic changes have resolved. No sizable effusion is noted. No bony abnormality is noted. IMPRESSION: Mild COPD.  Previously seen right basilar changes have resolved. Electronically Signed   By: Inez Catalina M.D.   On: 12/14/2017 13:34   Dg Chest 2 View  Result Date: 12/07/2017 CLINICAL DATA:  Initial evaluation for acute fever, shortness of breath. EXAM: CHEST - 2 VIEW COMPARISON:  Prior CT from 11/26/2017. FINDINGS: Cardiac and mediastinal silhouettes are stable in size and contour, and remain within normal limits. Aortic atherosclerosis. Lungs are hyperinflated with changes related COPD. Calcified granuloma projects over the left hemidiaphragm, stable. Patchy and linear right basilar opacity, which may reflect atelectasis and/or infiltrate. No pulmonary edema or pleural effusion. No pneumothorax. No acute osseus abnormality. IMPRESSION: 1. Patchy and linear right basilar opacity, which may reflect atelectasis and/or infiltrate. 2. Underlying COPD. 3. Aortic atherosclerosis. Electronically Signed   By: Jeannine Boga M.D.   On: 12/07/2017 18:13   Dg Chest 2 View  Result Date: 11/25/2017 CLINICAL DATA:  Dyspnea since Friday. EXAM: CHEST - 2 VIEW COMPARISON:  11/18/2017 FINDINGS: The heart size and mediastinal contours are within normal limits. Minimal aortic atherosclerosis at the arch without aneurysm. Hyperinflated lungs without  acute pneumonic consolidation or CHF. Small calcified granuloma at the left lung base. The visualized skeletal structures are unremarkable. IMPRESSION: Hyperinflated lungs consistent with COPD. Calcified granuloma projects over the left lung base. Electronically Signed   By: Ashley Royalty M.D.   On: 11/25/2017 20:13   Dg Chest 2 View  Result Date: 11/18/2017 CLINICAL DATA:  77 year old male with shortness of breath for the past day EXAM: CHEST - 2 VIEW COMPARISON:  Prior chest x-ray 11/03/2016 FINDINGS: Stable cardiac and mediastinal contours. Trace atherosclerotic calcifications present in the transverse aorta. The lungs remain hyperinflated. Increased retrosternal clear space on the lateral view. Stable calcified granuloma in the left lower lobe. No pneumothorax, pleural effusion or pulmonary edema. No acute osseous abnormality. IMPRESSION: 1. Pulmonary hyperinflation suggests underlying COPD. 2. No acute cardiopulmonary process. 3.  Aortic Atherosclerosis (ICD10-170.0) Electronically Signed   By: Jacqulynn Cadet M.D.   On: 11/18/2017 15:51   Ct Head Wo Contrast  Result Date: 12/14/2017 CLINICAL DATA:  Generalized weakness worsening over the last few days. EXAM: CT HEAD WITHOUT CONTRAST TECHNIQUE: Contiguous axial images were obtained from the base of the skull through the vertex without intravenous contrast. COMPARISON:  12/23/2004 FINDINGS: Brain: Mild age related volume loss. Few small areas of low-density in the hemispheric white matter likely represent chronic small vessel disease. No sign of acute infarction, mass lesion, hemorrhage, hydrocephalus or extra-axial collection. Vascular: There is atherosclerotic calcification of the major vessels at the base of the brain. Skull: Negative Sinuses/Orbits: Clear/normal Other: None IMPRESSION: No acute finding by CT. Age related atrophy. Mild small vessel change of the hemispheric white matter. Electronically Signed   By: Nelson Chimes M.D.   On: 12/14/2017  20:44   Ct Angio Chest Pe W Or Wo Contrast  Result Date: 11/26/2017 CLINICAL DATA:  Acute onset of shortness of breath. Decreased appetite and chills. EXAM: CT ANGIOGRAPHY CHEST WITH CONTRAST TECHNIQUE: Multidetector CT imaging of the chest was performed using the standard protocol during bolus administration of intravenous contrast. Multiplanar CT image reconstructions and MIPs were obtained to evaluate the vascular anatomy. CONTRAST:  146mL ISOVUE-370 IOPAMIDOL (ISOVUE-370) INJECTION 76% COMPARISON:  Chest radiograph performed 11/25/2017, and CT of the chest performed 04/22/2017 FINDINGS: Cardiovascular:  There is no evidence of pulmonary embolus. The heart is normal in size. Mild calcification is seen along the thoracic aorta and proximal great vessels. Scattered coronary artery calcifications are seen. Mediastinum/Nodes: Trace pericardial fluid remains within normal limits. Visualized mediastinal and hilar nodes are borderline normal in size. The visualized portions of the thyroid  gland are unremarkable. No axillary lymphadenopathy is seen. Lungs/Pleura: An 8 mm nodule is noted at the periphery of the right upper lobe (image 64 of 144). This has increased in size from 6 mm on the prior study. Additional scattered nodules are noted within the right upper and lower lobes, and the a 5 mm pleural-based nodule is noted at the left lower lobe (image 100 of 144). Bilateral emphysema is noted. No pleural effusion or pneumothorax is seen. Upper Abdomen: The visualized portions of the liver are unremarkable. Scattered calcified granulomata are seen within the spleen. The visualized portions of the pancreas and adrenal glands are within normal limits. Musculoskeletal: No acute osseous abnormalities are identified. The visualized musculature is unremarkable in appearance. Review of the MIP images confirms the above findings. IMPRESSION: 1. No evidence of pulmonary embolus. 2. The largest pulmonary nodule measures 8 mm,  with a tail extending centrally that is new from August. It is increased in size from 6 mm in August. PET/CT could be considered for further evaluation, to help exclude malignancy. 3. Additional bilateral pulmonary nodules appear relatively stable. 4. Bilateral emphysema noted 5. Scattered coronary artery calcifications. Electronically Signed   By: Garald Balding M.D.   On: 11/26/2017 02:27   US Aorta Complete  Result Date: 11/26/2017 CLINICAL DATA:  AAA (abdominal aortic aneurysm) (HCC) I71.4 (ICD-10-CM) EXAM: ULTRASOUND OF ABDOMINAL AORTA TECHNIQUE: Ultrasound examination of the abdominal aorta was performed to evaluate for abdominal aortic aneurysm. COMPARISON:  Lumbar MRI, 09/02/2013. FINDINGS: Abdominal aortic measurements as follows: Proximal:  2.6 cm Mid:  2.0 cm Distal:  2.7 cm Right common iliac: 1.5 cm Left common iliac: 1.1 cm Distal aortic systolic flow velocity: 71 centimeters/second. IMPRESSION: 1. Distal abdominal aortic aneurysm with a anterior-posterior dimension of 2.7 cm, measured at 2.5 cm on a prior ultrasound report dated 11/06/2006. On the MRI of the lumbar spine, the distal aorta measured 2.9 cm from anterior to posterior on the sagittal view. Ectatic abdominal aorta at risk for aneurysm development. Recommend followup by ultrasound in 5 years. This recommendation follows ACR consensus guidelines: White Paper of the ACR Incidental Findings Committee II on Vascular Findings. J Am Coll Radiol 2013; 10:789-794. Electronically Signed   By: Lajean Manes M.D.   On: 11/26/2017 11:32    Microbiology: Recent Results (from the past 240 hour(s))  Blood Culture (routine x 2)     Status: None   Collection Time: 12/07/17  5:30 PM  Result Value Ref Range Status   Specimen Description   Final    BLOOD LEFT ANTECUBITAL Performed at [facility] [address] [place], [place] [address]    Special Requests   Final    BOTTLES DRAWN AEROBIC AND ANAEROBIC Blood Culture  results may not be optimal due to an excessive volume of blood received in culture bottles Performed at [facility] [address], [place], [place] [address]    Culture   Final    NO GROWTH 5 DAYS Performed at [facility], [place] [address] [place], [place] [address]    Report Status 12/12/2017 FINAL  Final  Blood Culture (routine x 2)     Status: None   Collection Time: 12/07/17  5:41 PM  Result Value Ref Range Status   Specimen Description   Final    BLOOD RIGHT FOREARM Performed at [facility] [address], [place], [place] [address]    Special Requests   Final    BOTTLES  DRAWN AEROBIC AND ANAEROBIC Blood Culture results may not be optimal due to an excessive volume of blood received in culture bottles Performed at [facility], [place] [address], [place], [place] [address]    Culture   Final    NO GROWTH 5 DAYS Performed at [facility], [place] [address] [place], [place] [address]    Report Status 12/12/2017 FINAL  Final  Culture, sputum-assessment     Status: None   Collection Time: 12/08/17 10:13 AM  Result Value Ref Range Status   Specimen Description SPUTUM  Final   Special Requests NONE  Final   Sputum evaluation   Final    THIS SPECIMEN IS ACCEPTABLE FOR SPUTUM CULTURE Performed at [facility], [place] [address] [place], [place] [address]    Report Status 12/08/2017 FINAL  Final  Culture, respiratory (NON-Expectorated)     Status: None   Collection Time: 12/08/17 10:13 AM  Result Value Ref Range Status   Specimen Description   Final    SPUTUM Performed at [facility] [address], [place], [place] [address]    Special Requests   Final    NONE Reflexed from [id number] Performed at [facility], [place] [address], [place], [place] [address]    Gram Stain   Final    RARE WBC PRESENT, PREDOMINANTLY PMN FEW SQUAMOUS EPITHELIAL CELLS  PRESENT FEW GRAM POSITIVE COCCI    Culture   Final    Consistent with normal respiratory flora. Performed at [facility], [place] [address], [place], [place] [address]    Report Status 12/10/2017 FINAL  Final  MRSA PCR Screening     Status: None   Collection Time: 12/08/17  3:36 PM  Result Value Ref Range Status   MRSA by PCR NEGATIVE NEGATIVE Final    Comment:        The GeneXpert MRSA Assay (FDA approved for NASAL specimens only), is one component of a comprehensive MRSA colonization surveillance program. It is not intended to diagnose MRSA infection nor to guide or monitor treatment for MRSA infections. Performed at [facility], [place] [address] [place], [place] [address]   Blood culture (routine x 2)     Status: None (Preliminary result)   Collection Time: 12/14/17  1:08 PM  Result Value Ref Range Status   Specimen Description LEFT ANTECUBITAL  Final   Special Requests   Final    BOTTLES DRAWN AEROBIC AND ANAEROBIC Blood Culture adequate volume   Culture   Final    NO GROWTH 3 DAYS Performed at [facility], [address] [place], [place] [address]    Report Status PENDING  Incomplete  Blood culture (routine x 2)     Status: None (Preliminary result)   Collection Time: 12/14/17  1:13 PM  Result Value Ref Range Status   Specimen Description RIGHT ANTECUBITAL  Final   Special Requests   Final    BOTTLES DRAWN AEROBIC AND ANAEROBIC Blood Culture results may not be optimal due to an excessive volume of blood received in culture bottles   Culture   Final    NO GROWTH 3 DAYS Performed at [facility], [address] [place], [place] [address]    Report Status PENDING  Incomplete     Labs: Basic Metabolic Panel: Recent Labs  Lab 12/14/17 1352 12/15/17 0536 12/16/17 0443 12/17/17 0605  NA 138 139 141 147*  K 4.0 3.8 3.6 4.1  CL  100* 107 110 115*  CO2 21* 19* 20* 23  GLUCOSE 107* 254* 297* 143*  BUN 26* 24* 30* 32*  CREATININE 1.21 1.08  1.07 0.72  CALCIUM 8.8* 8.6* 9.0 8.8*   Liver Function Tests: Recent Labs  Lab 12/14/17 1352 12/15/17 0536  AST 20 31  ALT 29 29  ALKPHOS 60 56  BILITOT 2.4* 1.4*  1.6*  PROT 7.3 6.5  ALBUMIN 3.2* 2.7*   No results for input(s): LIPASE, AMYLASE in the last 168 hours. No results for input(s): AMMONIA in the last 168 hours. CBC: Recent Labs  Lab 12/14/17 1352 12/15/17 0536 12/16/17 0443 12/17/17 0605  WBC 19.8* 8.1 15.2* 14.8*  NEUTROABS 17.5*  --   --   --   HGB 14.9 13.8 11.5* 10.4*  HCT 45.2 40.1 33.7* 31.3*  MCV 90.6 87.4 88.0 90.5  PLT 183 173 175 171   Cardiac Enzymes: Recent Labs  Lab 12/14/17 1334 12/14/17 1749 12/14/17 2344 12/15/17 0536  TROPONINI <0.03 <0.03 <0.03 <0.03   BNP: BNP (last 3 results) Recent Labs    08/12/17 1355 11/18/17 1930 11/25/17 2031  BNP 29.0 40.0 59.0    ProBNP (last 3 results) No results for input(s): PROBNP in the last 8760 hours.  CBG: No results for input(s): GLUCAP in the last 168 hours.     Signed:  Lelon Frohlich  [facility] Pager: [phone] 12/17/2017, 1:56 PM

## 2017-12-17 NOTE — Care Management Important Message (Signed)
Important Message  Patient Details  Name: [name] [name] MRN: [id number] Date of Birth: 08/13/1941   Medicare Important Message Given:  Yes    Lesly Joslyn, [name], RN 12/17/2017, 11:40 AM

## 2017-12-17 NOTE — Progress Notes (Signed)
Patient's IV removed and site intact. Patient discharged with personal belongings, prescriptions, and walker.

## 2017-12-17 NOTE — Progress Notes (Signed)
SATURATION QUALIFICATIONS: (This note is used to comply with regulatory documentation for home oxygen)  Patient Saturations on Room Air at Rest = 93%  Patient Saturations on Room Air while Ambulating = 87%  Patient Saturations on 2Liters of oxygen while Ambulating = 96%  Please briefly explain why patient needs home oxygen: Patient's oxygen saturation dropped to 87% within a minute of walking without oxygen.

## 2017-12-17 NOTE — Care Management (Signed)
Patient discharging home today. LInda of [facility] notified of need for [facility] PT and will obtain orders via [employer]. [facility] will deliver RW to room prior to DC.  Patient and wife have talked with [name] of [facility] and are aware of next steps necessary to receive portable concentrator.

## 2017-12-17 NOTE — Progress Notes (Signed)
Received critical value lactic acid of 2.8 this AM.  It is trending down from previous lactic acids (3.2 and 4.1).

## 2017-12-19 LAB — CULTURE, BLOOD (ROUTINE X 2)
Culture: NO GROWTH
Culture: NO GROWTH
Special Requests: ADEQUATE

## 2017-12-21 ENCOUNTER — Telehealth: Payer: Self-pay | Admitting: Family Medicine

## 2017-12-21 ENCOUNTER — Ambulatory Visit (INDEPENDENT_AMBULATORY_CARE_PROVIDER_SITE_OTHER): Payer: Medicare Other | Admitting: Family Medicine

## 2017-12-21 ENCOUNTER — Encounter: Payer: Self-pay | Admitting: Family Medicine

## 2017-12-21 VITALS — BP 100/64 | HR 103 | Resp 18 | Ht 70.0 in | Wt 147.0 lb

## 2017-12-21 DIAGNOSIS — E0590 Thyrotoxicosis, unspecified without thyrotoxic crisis or storm: Secondary | ICD-10-CM | POA: Diagnosis not present

## 2017-12-21 DIAGNOSIS — D509 Iron deficiency anemia, unspecified: Secondary | ICD-10-CM

## 2017-12-21 DIAGNOSIS — J449 Chronic obstructive pulmonary disease, unspecified: Secondary | ICD-10-CM

## 2017-12-21 NOTE — Telephone Encounter (Signed)
reviewed

## 2017-12-21 NOTE — Telephone Encounter (Signed)
Transition Care Management Follow-up Telephone Call   Date discharged?   12/17/17             How have you been since you were released from the hospital? He states he is doing fine.   Do you understand why you were in the hospital? Yes   Do you understand the discharge instructions? Yes   Where were you discharged to? Home   Items Reviewed:  Medications reviewed: Yes  Allergies reviewed: Yes  Dietary changes reviewed: Yes  Referrals reviewed: Yes   Functional Questionnaire:   Activities of Daily Living (ADLs):  He is having difficulty ambulating, but is making progress.   Any transportation issues/concerns?: No   Any patient concerns? He would like to discuss medications with Dr. Meda Coffee when he comes in     Confirmed importance and date/time of follow-up visits scheduled    Yes  Confirmed with patient if condition begins to worsen call PCP or go to the ER.  Patient was given the office number and encouraged to call back with question or concerns.  :

## 2017-12-21 NOTE — Patient Instructions (Signed)
Continue blood thinner eliquis Need blood test and stool tests  I will send you a letter with your test results.  If there is anything of concern, we will call right away.  We have contacted [facility] about the humidifier and portable concentrator  See me in 2 weeks

## 2017-12-21 NOTE — Progress Notes (Signed)
Chief Complaint  Patient presents with  . Transitions Of Care  . Bleeding/Bruising    since starting the eliquis he has noticed dark tar stool and some dried blood in his nose   . Blurred Vision    since leaving hospital    Patient has had multiple ER visits and hospitalizations this spring.  Most recently I saw him on April 1, and because of unstable vital signs I sent him to the emergency room.  He was hospitalized with hospital-acquired pneumonia.Marland Kitchen  He states that he was treated with antibiotics, fluids, respiratory treatments.  He responded to treatment.  Because of his history of atrial fibrillation he was started on Eliquis.  He previously had declined to take an anticoagulant.  During this hospitalization he states the doctors told him his stroke risk was high and placed him on Eliquis at discharge.  He questions whether this is causing some blurred vision.  He is also had some blood crusting around his nostrils from the oxygen.  He states that he is also had some very dark to black stools.  He still feels quite tired.  He is never had a history of GI bleeding in the past.  I noticed that his hemoglobin appears to be dropping. Results for DOSS, CYBULSKI (MRN [id number]) as of 12/21/2017 12:57  Ref. Range 12/14/2017 13:52 12/15/2017 05:36 12/16/2017 04:43 12/17/2017 06:05  Hemoglobin Latest Ref Range: 13.0 - 17.0 g/dL 14.9 13.8 11.5 (L) 10.4 (L)  We will get a get a repeat CBC today, some iron studies.  I am also going to order stool Hemoccults.  He is not taking any iron, or using Pepto-Bismol, or any other changes that might increase the darkness of his stool.  He is feeling tired, but felt like this was rather normal after recovering from pneumonia.  Another finding during this hospitalization was mild hyperthyroidism.  He was started on Tapazole.  It is recommended that he have repeat thyroid functions in 4 to 6 weeks. Results for BROC, CASPERS (MRN [id number]) as of 12/21/2017 12:57  Ref. Range  12/16/2017 04:43 12/16/2017 17:17 12/16/2017 17:20  TSH Latest Ref Range: 0.350 - 4.500 uIU/mL 0.177 (L)    Triiodothyronine,Free,Serum Latest Ref Range: 2.0 - 4.4 pg/mL  2.0   T4,Free(Direct) Latest Ref Range: 0.61 - 1.12 ng/dL   1.39 (H)  Since going home he feels like he is eating well.  He continues to lose weight.  He is tremulous.  He has a low activity level.  Home physical therapy is due to start.  He is compliant with his medications.  He has had some increased swelling in his ankles, but I explained to him this could be from all the prednisone.  He remains on a prednisone taper at his discharge.  Patient Active Problem List   Diagnosis Date Noted  . SIRS (systemic inflammatory response syndrome) ([place]) 12/15/2017  . Acute on chronic respiratory failure with hypoxia ([place]) 12/15/2017  . Elevated lactic acid level   . Hypotension   . Tachycardia 12/14/2017  . HCAP (healthcare-associated pneumonia) 12/09/2017  . Malnutrition of moderate degree 12/09/2017  . Sepsis ([place]) 12/07/2017  . Protein-calorie malnutrition, severe 11/27/2017  . Orthostatic hypotension 11/25/2017  . COPD with acute exacerbation ([place]) 11/25/2017  . Leukocytosis 11/25/2017  . Abdominal aortic aneurysm (AAA) without rupture ([place]) 10/27/2017  . Multiple lung nodules on CT 05/14/2016  . COPD, severe ([place]) 02/19/2016  . Emphysema of lung ([place]) 02/19/2016  . Personal history of  prostate cancer   . Mixed hyperlipidemia   . DJD (degenerative joint disease), lumbar   . Neuropathy   . Back pain   . 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 ATRIAL FIBRILLATION, PAROXYSMAL 08/31/2007    Outpatient Encounter Medications as of 12/21/2017  Medication Sig  . apixaban (ELIQUIS) 5 MG TABS tablet Take 1 tablet (5 mg total) by mouth 2 (two) times daily.  . budesonide-formoterol (SYMBICORT) 160-4.5 MCG/ACT inhaler Inhale 2 puffs into the lungs 2 (two) times daily.  Marland Kitchen diltiazem (DILT-XR) 120 MG 24 hr capsule TAKE 2 CAPSULES BY MOUTH  ONCE DAILY IN THE MORNING AND 1 CAPSULE ONCE DAILY IN THE EVENING (Patient taking differently: Take 120 mg by mouth See admin instructions. TAKE 2 CAPSULES BY MOUTH ONCE DAILY IN THE MORNING AND 1 CAPSULE ONCE DAILY IN THE EVENING)  . ipratropium (ATROVENT) 0.02 % nebulizer solution Take 2.5 mLs (0.5 mg total) by nebulization every 6 (six) hours as needed for wheezing or shortness of breath.  Marland Kitchen LYRICA 150 MG capsule TAKE ONE CAPSULE BY MOUTH THREE TIMES DAILY  . methimazole (TAPAZOLE) 5 MG tablet Take 1 tablet (5 mg total) by mouth 2 (two) times daily.  . Oxycodone HCl 10 MG TABS Take 10 mg by mouth every 6 (six) hours as needed (pain).   . polyvinyl alcohol (LIQUIFILM TEARS) 1.4 % ophthalmic solution Place 1 drop into both eyes as needed for dry eyes.  . predniSONE (DELTASONE) 10 MG tablet Take 1 tablet (10 mg total) by mouth daily with breakfast. Take 6 tablets today and then decrease by 1 tablet daily until none are left.  Marland Kitchen Spacer/Aero-Holding Chambers (AEROCHAMBER MV) inhaler Use as instructed  . SPIRIVA RESPIMAT 2.5 MCG/ACT AERS INHALE TWO PUFFS BY MOUTH ONCE DAILY   No facility-administered encounter medications on file as of 12/21/2017.     Allergies  Allergen Reactions  . Albuterol Other (See Comments)    Caused extreme tachycardia. If used by continuous infusion.  May use the inhaler    Review of Systems  Constitutional: Positive for activity change, appetite change, fatigue and unexpected weight change.       Generally less active, more fatigued than prior to the pneumonia.  Continues to lose weight  HENT: Negative for congestion, postnasal drip and rhinorrhea.   Eyes: Negative for redness and visual disturbance.  Respiratory: Positive for cough, chest tightness, shortness of breath and wheezing.        Improving.  Remains on oxygen  Cardiovascular: Negative for chest pain and palpitations.  Gastrointestinal: Negative for abdominal pain, constipation and diarrhea.  Genitourinary:  Negative for difficulty urinating and frequency.  Musculoskeletal: Positive for arthralgias, back pain and gait problem.       Chronic orthopedic complaints, chronic pain management  Skin: Negative for color change and pallor.  Neurological: Positive for weakness. Negative for dizziness and light-headedness.       Generally weak , poor exercise tolerance  Hematological: Negative for adenopathy. Does not bruise/bleed easily.  Psychiatric/Behavioral: Negative for dysphoric mood and sleep disturbance. The patient is not nervous/anxious.     Physical Exam  Constitutional: He is oriented to person, place, and time. He appears well-developed and well-nourished. He appears distressed.  Appears frail.  Tired.  Dyspneic  HENT:  Head: Normocephalic and atraumatic.  Right Ear: External ear normal.  Left Ear: External ear normal.  Mouth/Throat: Oropharynx is clear and moist.  Eyes: Pupils are equal, round, and reactive to light. Conjunctivae are normal.  Neck: Normal range  of motion. Neck supple. No thyromegaly present.  Cardiovascular: Normal rate, regular rhythm and normal heart sounds.  Pulmonary/Chest: Effort normal. No respiratory distress.  Decreased breath sounds.  Scattered inspiratory wheeze.  No rales  Abdominal: Soft. Bowel sounds are normal.  Musculoskeletal: Normal range of motion. He exhibits no edema.  Lymphadenopathy:    He has no cervical adenopathy.  Neurological: He is alert and oriented to person, place, and time.  Gait normal.  Resting tremor both hands  Skin: Skin is warm and dry.  Solar damage.  Multiple ecchymosis  Psychiatric: He has a normal mood and affect. His behavior is normal. Thought content normal.  Nursing note and vitals reviewed.   BP 100/64   Pulse (!) 103   Resp 18   Ht 5\' 10"  (1.778 m)   Wt 147 lb (66.7 kg)   SpO2 93% Comment: on 2 liters oxygen  BMI 21.09 kg/m    ASSESSMENT/PLAN:  1. COPD with chronic bronchitis and emphysema ([place]) Currently  improving.  On steroid taper.  Is going to stay on low dose until he returns.  2. Microcytic anemia Appears to be worsening on Eliquis.  Question GI bleeding.  Patient can stand Eliquis until he gets some testing done, may need to stop if he is losing blood into intestinal tract.  He denies nausea vomiting or abdominal pain. - CBC with Differential/Platelet - POC Hemoccult Bld/Stl (3-Cd Home Screen); Future - Fe+TIBC+Fer 3.  Hyperthyroidism. On Tapazole.  Needs lab work in 4 to 6 weeks  Patient Instructions  Continue blood thinner eliquis Need blood test and stool tests  I will send you a letter with your test results.  If there is anything of concern, we will call right away.  We have contacted [facility] about the humidifier and portable concentrator  See me in 2 weeks   [name], MD

## 2017-12-22 LAB — CBC WITH DIFFERENTIAL/PLATELET
BASOS ABS: 126 {cells}/uL (ref 0–200)
Basophils Relative: 0.8 %
EOS ABS: 16 {cells}/uL (ref 15–500)
Eosinophils Relative: 0.1 %
HEMATOCRIT: 37.7 % — AB (ref 38.5–50.0)
HEMOGLOBIN: 13.2 g/dL (ref 13.2–17.1)
Lymphs Abs: 377 cells/uL — ABNORMAL LOW (ref 850–3900)
MCH: 30.2 pg (ref 27.0–33.0)
MCHC: 35 g/dL (ref 32.0–36.0)
MCV: 86.3 fL (ref 80.0–100.0)
MONOS PCT: 4.4 %
MPV: 10.3 fL (ref 7.5–12.5)
Neutro Abs: 14491 cells/uL — ABNORMAL HIGH (ref 1500–7800)
Neutrophils Relative %: 92.3 %
PLATELETS: 285 10*3/uL (ref 140–400)
RBC: 4.37 10*6/uL (ref 4.20–5.80)
RDW: 14.1 % (ref 11.0–15.0)
Total Lymphocyte: 2.4 %
WBC mixed population: 691 cells/uL (ref 200–950)
WBC: 15.7 10*3/uL — ABNORMAL HIGH (ref 3.8–10.8)

## 2017-12-22 LAB — IRON,TIBC AND FERRITIN PANEL
%SAT: 28 % (calc) (ref 15–60)
Ferritin: 704 ng/mL — ABNORMAL HIGH (ref 20–380)
Iron: 53 ug/dL (ref 50–180)
TIBC: 188 mcg/dL (calc) — ABNORMAL LOW (ref 250–425)

## 2017-12-22 NOTE — Progress Notes (Signed)
Spoke with patient and gave results of recent lab work with verbal understanding.

## 2017-12-25 ENCOUNTER — Telehealth (INDEPENDENT_AMBULATORY_CARE_PROVIDER_SITE_OTHER): Payer: Medicare Other

## 2017-12-25 DIAGNOSIS — Z1211 Encounter for screening for malignant neoplasm of colon: Secondary | ICD-10-CM

## 2017-12-25 LAB — HEMOCCULT GUIAC POC 1CARD (OFFICE)
Card #3 Fecal Occult Blood, POC: POSITIVE
FECAL OCCULT BLD: POSITIVE
Fecal Occult Blood, POC: POSITIVE — AB

## 2017-12-25 NOTE — Telephone Encounter (Signed)
Patient dropped off fob cards.

## 2017-12-28 NOTE — Telephone Encounter (Signed)
Called patient and let him know results of fecal occult cards and Dr.Nelson's recommendations with verbal understanding. Patient in agreement with treatment plan. Patient will stop blood thinner and wait for notification of appt with gastroenterology for consult.

## 2018-01-04 ENCOUNTER — Ambulatory Visit (INDEPENDENT_AMBULATORY_CARE_PROVIDER_SITE_OTHER): Payer: Medicare Other | Admitting: Family Medicine

## 2018-01-04 ENCOUNTER — Other Ambulatory Visit: Payer: Self-pay

## 2018-01-04 ENCOUNTER — Encounter: Payer: Self-pay | Admitting: Family Medicine

## 2018-01-04 VITALS — BP 106/58 | HR 89 | Temp 98.4°F | Resp 20 | Ht 70.0 in | Wt 146.0 lb

## 2018-01-04 DIAGNOSIS — R195 Other fecal abnormalities: Secondary | ICD-10-CM | POA: Diagnosis not present

## 2018-01-04 DIAGNOSIS — I480 Paroxysmal atrial fibrillation: Secondary | ICD-10-CM

## 2018-01-04 DIAGNOSIS — J449 Chronic obstructive pulmonary disease, unspecified: Secondary | ICD-10-CM

## 2018-01-04 DIAGNOSIS — R634 Abnormal weight loss: Secondary | ICD-10-CM

## 2018-01-04 MED ORDER — ALBUTEROL SULFATE (2.5 MG/3ML) 0.083% IN NEBU: 2.5000 mg | INHALATION_SOLUTION | Freq: Four times a day (QID) | RESPIRATORY_TRACT | 12 refills | Status: DC | PRN

## 2018-01-04 MED ORDER — PREDNISONE 5 MG PO TABS: 5.0000 mg | ORAL_TABLET | Freq: Every day | ORAL | 1 refills | Status: DC

## 2018-01-04 NOTE — Patient Instructions (Addendum)
Referred to Gastroenterology Call if you have not heard from them in a week  EAT several times a day  Taper the prednisone to 5 a day as tolerated  See Pulmonary as scheduled  No aspirin or blood thinners until seen by GI  Follow up with Dr Mannie Stabile See her in a month or two

## 2018-01-04 NOTE — Progress Notes (Signed)
Chief Complaint  Patient presents with  . COPD with chronic bronichitis and emphysema    follow up   Patient is here for routine scheduled follow-up. At his last follow-up I left him on prednisone 10 mg a day until today.  He states that he feels better and has not had any exacerbation of his COPD, cough, or shortness of breath.  No sputum, fever, or signs of infection.  We discussed today that I am going to try to taper him down to 5 mg a day.  He should take 1/2 pills a day for 2 weeks and then down to 5 mg a day as tolerated.  Stay on 5 mg a day until you see pulmonary medicine. He did develop a dropping hematocrit and heme positive stool.  I called and  told him to stop his Eliquis.  Do not substitute aspirin.  He is referred to gastroenterology.  His last hemoglobin was stable, and is not having any melena at this time.  He is to report immediately any black stool, dizziness, or low blood pressure. This visit he is only lost 1 more pound.  Much better than previous.  A month ago he weighed 160, today he is 146.  He does look lean, pale, and gaunt.  He was seen by a nutritionist when he is in the hospital.  He understands that he has to eat frequent small meals throughout the day, and supplement with shakes and protein as much as he can tolerate.  He states if he eats a large meal it impairs his breathing. He was found to be slightly hyper thyroid started on methimazole.  He is not yet due for thyroid testing.  Should be done next visit. He requests albuterol for his nebulizer.  I discussed with him that this is listed as an allergy because he reports tachycardia with albuterol.  He states he has difficulty every time the emergency room puts him on a continuous albuterol inhaler, but he can use it as needed.  Patient Active Problem List   Diagnosis Date Noted  . SIRS (systemic inflammatory response syndrome) ([place]) 12/15/2017  . Acute on chronic respiratory failure with hypoxia ([place])  12/15/2017  . Elevated lactic acid level   . Hypotension   . Tachycardia 12/14/2017  . HCAP (healthcare-associated pneumonia) 12/09/2017  . Malnutrition of moderate degree 12/09/2017  . Sepsis ([place]) 12/07/2017  . Protein-calorie malnutrition, severe 11/27/2017  . Orthostatic hypotension 11/25/2017  . COPD with acute exacerbation ([place]) 11/25/2017  . Leukocytosis 11/25/2017  . Abdominal aortic aneurysm (AAA) without rupture ([place]) 10/27/2017  . Multiple lung nodules on CT 05/14/2016  . COPD, severe ([place]) 02/19/2016  . Emphysema of lung ([place]) 02/19/2016  . Personal history of prostate cancer   . Mixed hyperlipidemia   . DJD (degenerative joint disease), lumbar   . Neuropathy   . Back pain   . 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 ATRIAL FIBRILLATION, PAROXYSMAL 08/31/2007    Outpatient Encounter Medications as of 01/04/2018  Medication Sig  . budesonide-formoterol (SYMBICORT) 160-4.5 MCG/ACT inhaler Inhale 2 puffs into the lungs 2 (two) times daily.  Marland Kitchen diltiazem (DILT-XR) 120 MG 24 hr capsule TAKE 2 CAPSULES BY MOUTH ONCE DAILY IN THE MORNING AND 1 CAPSULE ONCE DAILY IN THE EVENING (Patient taking differently: Take 120 mg by mouth See admin instructions. TAKE 2 CAPSULES BY MOUTH ONCE DAILY IN THE MORNING AND 1 CAPSULE ONCE DAILY IN THE EVENING)  . ipratropium (ATROVENT) 0.02 % nebulizer solution  Take 2.5 mLs (0.5 mg total) by nebulization every 6 (six) hours as needed for wheezing or shortness of breath.  Marland Kitchen LYRICA 150 MG capsule TAKE ONE CAPSULE BY MOUTH THREE TIMES DAILY  . methimazole (TAPAZOLE) 5 MG tablet Take 1 tablet (5 mg total) by mouth 2 (two) times daily.  . Oxycodone HCl 10 MG TABS Take 10 mg by mouth every 6 (six) hours as needed (pain).   . polyvinyl alcohol (LIQUIFILM TEARS) 1.4 % ophthalmic solution Place 1 drop into both eyes as needed for dry eyes.  Marland Kitchen Spacer/Aero-Holding Chambers (AEROCHAMBER MV) inhaler Use as instructed  . SPIRIVA RESPIMAT 2.5 MCG/ACT AERS INHALE TWO  PUFFS BY MOUTH ONCE DAILY  . predniSONE (DELTASONE) 5 MG tablet Take 1 tablet (5 mg total) by mouth daily with breakfast. Take one or two a day, taper as tolerated   No facility-administered encounter medications on file as of 01/04/2018.     Allergies  Allergen Reactions  . Albuterol Other (See Comments)    Caused extreme tachycardia. If used by continuous infusion.  May use the inhaler    Review of Systems  Constitutional: Positive for activity change, appetite change, fatigue and unexpected weight change.       Generally less active, more fatigued than prior to the pneumonia.  Continues to lose weight  HENT: Negative for congestion, postnasal drip and rhinorrhea.   Eyes: Negative for redness and visual disturbance.  Respiratory: Positive for cough and shortness of breath. Negative for chest tightness and wheezing.        Improving.    Cardiovascular: Negative for chest pain and palpitations.  Gastrointestinal: Negative for abdominal pain, constipation and diarrhea.  Genitourinary: Negative for difficulty urinating and frequency.  Musculoskeletal: Positive for arthralgias, back pain and gait problem.       Chronic orthopedic complaints, chronic pain management  Skin: Negative for color change and pallor.  Neurological: Positive for weakness. Negative for dizziness and light-headedness.       Generally weak , poor exercise tolerance  Hematological: Negative for adenopathy. Does not bruise/bleed easily.  Psychiatric/Behavioral: Negative for dysphoric mood and sleep disturbance. The patient is not nervous/anxious.     Physical Exam  Constitutional: He is oriented to person, place, and time. He appears well-developed and well-nourished. He appears distressed.  Appears frail.  Tired.  Dyspneic  HENT:  Head: Normocephalic and atraumatic.  Right Ear: External ear normal.  Left Ear: External ear normal.  Mouth/Throat: Oropharynx is clear and moist.  Eyes: Pupils are equal, round, and  reactive to light. Conjunctivae are normal.  Neck: Normal range of motion. Neck supple. No thyromegaly present.  Cardiovascular: Normal rate, regular rhythm and normal heart sounds.  Pulmonary/Chest: Effort normal. No respiratory distress. He has wheezes. He has rales.  Decreased breath sounds.  Few scattered end expiratory wheeze.  Faint rales both bases  Abdominal: Soft. Bowel sounds are normal.  Musculoskeletal: Normal range of motion. He exhibits no edema.  Lymphadenopathy:    He has no cervical adenopathy.  Neurological: He is alert and oriented to person, place, and time.  Gait normal.  Resting tremor both hands  Skin: Skin is warm and dry.  Solar damage.  Multiple ecchymosis  Psychiatric: He has a normal mood and affect. His behavior is normal. Thought content normal.  Nursing note and vitals reviewed.   BP (!) 106/58   Pulse 89   Temp 98.4 F (36.9 C) (Oral)   Resp 20   Ht 5\' 10"  (1.778 m)  Wt 146 lb (66.2 kg)   SpO2 91%   BMI 20.95 kg/m     ASSESSMENT/PLAN:  1. COPD with chronic bronchitis and emphysema ([place]) Doing well on steroids.  Will taper as tolerated.  2. Heme positive stool Refer to GI  3. Paroxysmal atrial fibrillation ([facility]) Chronic.  Stopped anticoagulants due to GI bleeding.  4.  Weight loss Chronic.  We discussed the challenges of maintaining his weight with his severe COPD.  There may be a component of thyroid disease.  Patient Instructions  Referred to Gastroenterology Call if you have not heard from them in a week  EAT several times a day  Taper the prednisone to 5 a day as tolerated  See Pulmonary as scheduled  No aspirin or blood thinners until seen by GI  Follow up with Dr Mannie Stabile See her in a month or two      [name], MD

## 2018-01-19 ENCOUNTER — Ambulatory Visit (INDEPENDENT_AMBULATORY_CARE_PROVIDER_SITE_OTHER): Payer: Medicare Other | Admitting: Adult Health

## 2018-01-19 ENCOUNTER — Encounter: Payer: Self-pay | Admitting: Adult Health

## 2018-01-19 DIAGNOSIS — R918 Other nonspecific abnormal finding of lung field: Secondary | ICD-10-CM | POA: Diagnosis not present

## 2018-01-19 DIAGNOSIS — J9611 Chronic respiratory failure with hypoxia: Secondary | ICD-10-CM

## 2018-01-19 MED ORDER — FLUTICASONE-UMECLIDIN-VILANT 100-62.5-25 MCG/INH IN AEPB: 1.0000 | INHALATION_SPRAY | Freq: Every day | RESPIRATORY_TRACT | 0 refills | Status: DC

## 2018-01-19 MED ORDER — FLUTICASONE-UMECLIDIN-VILANT 100-62.5-25 MCG/INH IN AEPB: 1.0000 | INHALATION_SPRAY | Freq: Every day | RESPIRATORY_TRACT | 5 refills | Status: DC

## 2018-01-19 NOTE — Addendum Note (Signed)
Addended by: Parke Poisson E on: 01/19/2018 03:58 PM   Modules accepted: Orders

## 2018-01-19 NOTE — Progress Notes (Signed)
@Patient  ID: [name] [name], male    DOB: 20-Feb-1941, 77 y.o.   MRN: [id number]  No chief complaint on file.   Referring provider: [name], MD  HPI: 77 year old male former smoker followed for severe COPD, pulmonary nodules Past medical history significant for A. Fib  PFT 05/216 >FEV 1 50%, ratio 50 .   01/19/2018 Follow up ; COPD , Lung nodules Patient presents for a post hospital follow-up.  Patient was admitted last month after acute on chronic respiratory failure with a mild COPD exacerbation and suspected decompensation after abruptly stopping chronic steroids.  On admission he had some hypotension elevated lactic acid at 3.5.  He was treated with initial antibiotics.  Started on steroid therapy.  And given gentle hydration.  Since discharge patient is feeling some better, feels he is starting to turn the corner.  He feels 80% better. Is able to get dressed and walk slowly in house. Able to drive. Gets winded easily .  Remains on Symbicort and Spriiva .  PVX and Prevnar 13 utd.   On Oxygen 2l/m with act and At bedtime  . Would like a POC , wants to be more mobile .    Was admitted in March 2 different times for slow to resolve PNA . Has been seen in ER several times. Was treated with abx and steroids . He was placed on low dose prednisone 5mg  daily .  Patient has underlying severe COPD and is on   Patient has a history of lung nodules.  CT chest March 2018 shows scattered pulmonary nodules measuring X millimeters or less.  Considered benign.  Since no change since 2007 CT chest August 2018 showed scattered pulmonary nodules that were stable however there were small pulmonary nodules in the right lower lobe and left upper lobe that were new.  This was along with COPD changes and severe emphysema noted. CT chest November 26, 2017 did show a slight increase in pulmonary nodule from 6 mm to 8 mm In the right upper lobe.     Allergies  Allergen Reactions  . Albuterol Other  (See Comments)    Caused extreme tachycardia. If used by continuous infusion.  May use the inhaler    Immunization History  Administered Date(s) Administered  . Influenza Split 06/15/2012  . Influenza Whole 06/21/2009, 06/15/2010, 06/17/2011  . Influenza, High Dose Seasonal PF 05/21/2016, 06/23/2017  . Influenza,inj,Quad PF,6+ Mos 05/18/2013, 05/15/2014, 10/29/2015  . Pneumococcal Conjugate-13 05/15/2014  . Pneumococcal Polysaccharide-23 05/18/2008, 05/18/2013    Past Medical History:  Diagnosis Date  . AAA (abdominal aortic aneurysm) ([place])   . COPD with emphysema ([place])    Dr. Joya Gaskins, Gold stage C  . DJD (degenerative joint disease), lumbar   . Emphysema of lung ([place])   . History of prostate cancer    Status post radioactive seed implants  . History of shingles   . Incomplete emptying of bladder   . Mixed hyperlipidemia   . PAF (paroxysmal atrial fibrillation) ([place])   . Palpitations   . Peripheral neuropathy   . Sciatic nerve pain    Secondary to shingles 2011  . Urethral stricture   . Wears partial dentures    Upper and lower    Tobacco History: Social History   Tobacco Use  Smoking Status Former Smoker  . Packs/day: 2.00  . Years: 40.00  . Pack years: 80.00  . Types: Cigarettes  . Start date: 06/19/1954  . Last attempt to quit: 09/15/1998  . Years  since quitting: 19.3  Smokeless Tobacco Never Used  Tobacco Comment   Counseled to remain smoke free   Counseling given: Not Answered Comment: Counseled to remain smoke free   Outpatient Encounter Medications as of 01/19/2018  Medication Sig  . albuterol (PROVENTIL) (2.5 MG/3ML) 0.083% nebulizer solution Take 3 mLs (2.5 mg total) by nebulization every 6 (six) hours as needed for wheezing or shortness of breath.  . budesonide-formoterol (SYMBICORT) 160-4.5 MCG/ACT inhaler Inhale 2 puffs into the lungs 2 (two) times daily.  Marland Kitchen diltiazem (DILT-XR) 120 MG 24 hr capsule TAKE 2 CAPSULES BY MOUTH ONCE DAILY IN THE MORNING  AND 1 CAPSULE ONCE DAILY IN THE EVENING (Patient taking differently: Take 120 mg by mouth See admin instructions. TAKE 2 CAPSULES BY MOUTH ONCE DAILY IN THE MORNING AND 1 CAPSULE ONCE DAILY IN THE EVENING)  . ipratropium (ATROVENT) 0.02 % nebulizer solution Take 2.5 mLs (0.5 mg total) by nebulization every 6 (six) hours as needed for wheezing or shortness of breath.  Marland Kitchen LYRICA 150 MG capsule TAKE ONE CAPSULE BY MOUTH THREE TIMES DAILY  . methimazole (TAPAZOLE) 5 MG tablet Take 1 tablet (5 mg total) by mouth 2 (two) times daily.  . Oxycodone HCl 10 MG TABS Take 10 mg by mouth every 6 (six) hours as needed (pain).   . predniSONE (DELTASONE) 5 MG tablet Take 1 tablet (5 mg total) by mouth daily with breakfast. Take one or two a day, taper as tolerated  . Spacer/Aero-Holding Chambers (AEROCHAMBER MV) inhaler Use as instructed  . SPIRIVA RESPIMAT 2.5 MCG/ACT AERS INHALE TWO PUFFS BY MOUTH ONCE DAILY  . [DISCONTINUED] polyvinyl alcohol (LIQUIFILM TEARS) 1.4 % ophthalmic solution Place 1 drop into both eyes as needed for dry eyes. (Patient not taking: Reported on 01/19/2018)   No facility-administered encounter medications on file as of 01/19/2018.      Review of Systems  Constitutional:   No  weight loss, night sweats,  Fevers, chills +, fatigue, or  lassitude.  HEENT:   No headaches,  Difficulty swallowing,  Tooth/dental problems, or  Sore throat,                No sneezing, itching, ear ache, nasal congestion, post nasal drip,   CV:  No chest pain,  Orthopnea, PND, swelling in lower extremities, anasarca, dizziness, palpitations, syncope.   GI  No heartburn, indigestion, abdominal pain, nausea, vomiting, diarrhea, change in bowel habits, loss of appetite, bloody stools.   Resp:  .  No chest wall deformity  Skin: no rash or lesions.  GU: no dysuria, change in color of urine, no urgency or frequency.  No flank pain, no hematuria   MS:  No joint pain or swelling.  No decreased range of motion.   No back pain.    Physical Exam  BP (!) 114/56 (BP Location: Left Arm, Cuff Size: Normal)   Pulse 78   Ht 5\' 10"  (1.778 m)   Wt 155 lb (70.3 kg)   SpO2 96%   BMI 22.24 kg/m   GEN: A/Ox3; pleasant , NAD, thin male    HEENT:  [place]/AT,  EACs-clear, TMs-wnl, NOSE-clear, THROAT-clear, no lesions, no postnasal drip or exudate noted.   NECK:  Supple w/ fair ROM; no JVD; normal carotid impulses w/o bruits; no thyromegaly or nodules palpated; no lymphadenopathy.    RESP  Decreased BS in bases . no accessory muscle use, no dullness to percussion  CARD:  RRR, no m/r/g, no peripheral edema, pulses intact, no cyanosis  or clubbing.  GI:   Soft & nt; nml bowel sounds; no organomegaly or masses detected.   Musco: Warm bil, no deformities or joint swelling noted.   Neuro: alert, no focal deficits noted.    Skin: Warm, no lesions or rashes    Lab Results:  CBC  BMET  BNP  Imaging: No results found.   Assessment & Plan:   COPD, severe ([place]) Recently difficult to control with recurrent exacerbations now on chronic steroids. Patient is having trouble with medication costs.  Says he has not missed any medications but they are very expensive  Plan  Patient Instructions  May Try TRELEGY 1 puff daily (in place of Symbicort /Spiriva to see if more affordable )  Continue on Oxygen with 2l/m with activity and At bedtime   POC order .  CT chest in June to follow lung nodule  Follow up with Dr. Lamonte Sakai  In 2 months and As needed         Multiple lung nodules on CT Bilateral lung nodules noted on CT since 2017.  Most recent CT chest in March showed a slight increase in her right upper lobe nodule from 6 mm to 8 mm.  We will recheck CT chest in 3 months.  This will be in June 2019.  Chronic respiratory failure (HCC) Continue on oxygen with activity at bedtime.  Goal is to have O2 saturation greater than 88 to 90%. Patient wants to be more independent and mobile.  Will order a portable  oxygen concentrator to help with this.     Rexene Edison, NP 01/19/2018

## 2018-01-19 NOTE — Assessment & Plan Note (Signed)
Continue on oxygen with activity at bedtime.  Goal is to have O2 saturation greater than 88 to 90%. Patient wants to be more independent and mobile.  Will order a portable oxygen concentrator to help with this.

## 2018-01-19 NOTE — Assessment & Plan Note (Signed)
Bilateral lung nodules noted on CT since 2017.  Most recent CT chest in March showed a slight increase in her right upper lobe nodule from 6 mm to 8 mm.  We will recheck CT chest in 3 months.  This will be in June 2019.

## 2018-01-19 NOTE — Patient Instructions (Addendum)
May Try TRELEGY 1 puff daily (in place of Symbicort /Spiriva to see if more affordable )  Continue on Oxygen with 2l/m with activity and At bedtime   POC order .  CT chest in June to follow lung nodule  Follow up with Dr. Lamonte Sakai  In 2 months and As needed

## 2018-01-19 NOTE — Addendum Note (Signed)
Addended by: Parke Poisson E on: 01/19/2018 03:32 PM   Modules accepted: Orders

## 2018-01-19 NOTE — Assessment & Plan Note (Signed)
Recently difficult to control with recurrent exacerbations now on chronic steroids. Patient is having trouble with medication costs.  Says he has not missed any medications but they are very expensive  Plan  Patient Instructions  May Try TRELEGY 1 puff daily (in place of Symbicort /Spiriva to see if more affordable )  Continue on Oxygen with 2l/m with activity and At bedtime   POC order .  CT chest in June to follow lung nodule  Follow up with Dr. Lamonte Sakai  In 2 months and As needed

## 2018-01-20 ENCOUNTER — Telehealth: Payer: Self-pay | Admitting: Adult Health

## 2018-01-20 DIAGNOSIS — J449 Chronic obstructive pulmonary disease, unspecified: Secondary | ICD-10-CM

## 2018-01-20 DIAGNOSIS — J439 Emphysema, unspecified: Secondary | ICD-10-CM

## 2018-01-20 NOTE — Telephone Encounter (Signed)
Order has been placed on the oxygen template.

## 2018-02-05 DIAGNOSIS — M6281 Muscle weakness (generalized): Secondary | ICD-10-CM | POA: Diagnosis not present

## 2018-02-05 DIAGNOSIS — J439 Emphysema, unspecified: Secondary | ICD-10-CM | POA: Diagnosis not present

## 2018-02-05 DIAGNOSIS — M4306 Spondylolysis, lumbar region: Secondary | ICD-10-CM | POA: Diagnosis not present

## 2018-02-05 DIAGNOSIS — J9621 Acute and chronic respiratory failure with hypoxia: Secondary | ICD-10-CM | POA: Diagnosis not present

## 2018-02-16 ENCOUNTER — Ambulatory Visit: Payer: Medicare Other | Admitting: Family Medicine

## 2018-02-18 ENCOUNTER — Emergency Department (HOSPITAL_COMMUNITY): Payer: Medicare Other

## 2018-02-18 ENCOUNTER — Encounter (HOSPITAL_COMMUNITY): Payer: Self-pay | Admitting: Emergency Medicine

## 2018-02-18 ENCOUNTER — Encounter: Payer: Self-pay | Admitting: Family Medicine

## 2018-02-18 ENCOUNTER — Emergency Department (HOSPITAL_COMMUNITY)
Admission: EM | Admit: 2018-02-18 | Discharge: 2018-02-18 | Disposition: A | Discharge status: Home / Self Care | Payer: Medicare Other | Attending: Emergency Medicine | Admitting: Emergency Medicine

## 2018-02-18 ENCOUNTER — Other Ambulatory Visit: Payer: Self-pay

## 2018-02-18 DIAGNOSIS — M7989 Other specified soft tissue disorders: Secondary | ICD-10-CM | POA: Diagnosis present

## 2018-02-18 DIAGNOSIS — R600 Localized edema: Secondary | ICD-10-CM | POA: Insufficient documentation

## 2018-02-18 DIAGNOSIS — Z79899 Other long term (current) drug therapy: Secondary | ICD-10-CM | POA: Diagnosis not present

## 2018-02-18 DIAGNOSIS — I714 Abdominal aortic aneurysm, without rupture: Secondary | ICD-10-CM | POA: Diagnosis not present

## 2018-02-18 DIAGNOSIS — Z87891 Personal history of nicotine dependence: Secondary | ICD-10-CM | POA: Insufficient documentation

## 2018-02-18 DIAGNOSIS — Z8546 Personal history of malignant neoplasm of prostate: Secondary | ICD-10-CM | POA: Diagnosis not present

## 2018-02-18 LAB — CBC WITH DIFFERENTIAL/PLATELET
BASOS ABS: 0 10*3/uL (ref 0.0–0.1)
Basophils Relative: 1 %
EOS PCT: 1 %
Eosinophils Absolute: 0.1 10*3/uL (ref 0.0–0.7)
HCT: 44.7 % (ref 39.0–52.0)
Hemoglobin: 14.5 g/dL (ref 13.0–17.0)
LYMPHS PCT: 22 %
Lymphs Abs: 1.8 10*3/uL (ref 0.7–4.0)
MCH: 29.5 pg (ref 26.0–34.0)
MCHC: 32.4 g/dL (ref 30.0–36.0)
MCV: 90.9 fL (ref 78.0–100.0)
MONO ABS: 0.7 10*3/uL (ref 0.1–1.0)
MONOS PCT: 9 %
Neutro Abs: 5.4 10*3/uL (ref 1.7–7.7)
Neutrophils Relative %: 67 %
PLATELETS: 226 10*3/uL (ref 150–400)
RBC: 4.92 MIL/uL (ref 4.22–5.81)
RDW: 13.8 % (ref 11.5–15.5)
WBC: 8 10*3/uL (ref 4.0–10.5)

## 2018-02-18 LAB — BASIC METABOLIC PANEL
Anion gap: 5 (ref 5–15)
BUN: 8 mg/dL (ref 6–20)
CO2: 30 mmol/L (ref 22–32)
CREATININE: 1.01 mg/dL (ref 0.61–1.24)
Calcium: 9.4 mg/dL (ref 8.9–10.3)
Chloride: 107 mmol/L (ref 101–111)
GFR calc Af Amer: >60 mL/min (ref >60)
GLUCOSE: 96 mg/dL (ref 65–99)
Potassium: 4.1 mmol/L (ref 3.5–5.1)
Sodium: 142 mmol/L (ref 135–145)

## 2018-02-18 MED ORDER — DOXYCYCLINE HYCLATE 100 MG PO TABS: 100.0000 mg | ORAL_TABLET | Freq: Two times a day (BID) | ORAL | 0 refills | Status: DC

## 2018-02-18 NOTE — ED Provider Notes (Signed)
[facility] EMERGENCY DEPARTMENT Provider Note   CSN: [id number] Arrival date & time: 02/18/18  7829     History   Chief Complaint Chief Complaint  Patient presents with  . Ankle Pain    HPI AREON COCUZZA is a 77 y.o. male.  HPI  Pt was seen at 1110. Per pt, c/o gradual onset and persistence of constant right lower leg and foot "swelling" for the past 2 months. Pt states he was discharged 2 months ago for dx pneumonia and the swelling began after that. Pt was evaluated by his PMD today, then sent to the ED for "an ultrasound to make sure I didn't have a blood clot." Pt endorses erythema to bilat LE's that also has been present for the past 2 months. Denies fevers, no focal motor weakness, no tingling/numbness in extremities, no abd pain, no back pain, no injury, no CP/SOB, no LE joints pain.    Past Medical History:  Diagnosis Date  . AAA (abdominal aortic aneurysm) ([place])   . COPD with emphysema ([place])    Dr. Joya Gaskins, Gold stage C  . DJD (degenerative joint disease), lumbar   . Emphysema of lung ([place])   . History of prostate cancer    Status post radioactive seed implants  . History of shingles   . Incomplete emptying of bladder   . Mixed hyperlipidemia   . PAF (paroxysmal atrial fibrillation) ([place])   . Palpitations   . Peripheral neuropathy   . Sciatic nerve pain    Secondary to shingles 2011  . Urethral stricture   . Wears partial dentures    Upper and lower    Patient Active Problem List   Diagnosis Date Noted  . SIRS (systemic inflammatory response syndrome) ([place]) 12/15/2017  . Chronic respiratory failure ([place]) 12/15/2017  . Elevated lactic acid level   . Hypotension   . Tachycardia 12/14/2017  . HCAP (healthcare-associated pneumonia) 12/09/2017  . Malnutrition of moderate degree 12/09/2017  . Sepsis ([place]) 12/07/2017  . Protein-calorie malnutrition, severe 11/27/2017  . Orthostatic hypotension 11/25/2017  . COPD with acute exacerbation ([place]) 11/25/2017  .  Leukocytosis 11/25/2017  . Abdominal aortic aneurysm (AAA) without rupture ([place]) 10/27/2017  . Multiple lung nodules on CT 05/14/2016  . COPD, severe ([place]) 02/19/2016  . Emphysema of lung ([place]) 02/19/2016  . Personal history of prostate cancer   . Mixed hyperlipidemia   . DJD (degenerative joint disease), lumbar   . Neuropathy   . Back pain   . 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 ATRIAL FIBRILLATION, PAROXYSMAL 08/31/2007    Past Surgical History:  Procedure Laterality Date  . CATARACT EXTRACTION W/ INTRAOCULAR LENS  IMPLANT, BILATERAL  2012  . COLONOSCOPY  02-14-2003  . CYSTO/  BALLOON DILATION AND INCISION BLADDER NECK CONTRACTURE  X5  LAST ONE 01-24-2009  . CYSTOSCOPY WITH URETHRAL DILATATION  09/15/2011   Procedure: CYSTOSCOPY WITH URETHRAL DILATATION;  Surgeon: Bernestine Amass, MD;  Location: [facility];  Service: Urology;  Laterality: N/A;  CYSTOSCOPY, BALLOON DILATION   . CYSTOSCOPY WITH URETHRAL DILATATION N/A 04/03/2014   Procedure: CYSTOSCOPY WITH dilatation;  Surgeon: Bernestine Amass, MD;  Location: [facility];  Service: Urology;  Laterality: N/A;  . EYE SURGERY    . RADIOACTIVE PROSTATE SEED IMPLANTS/  CYSTO WITH BALLOON DILATION BLADDER NECK CONTRACTURE  11-26-2006        Home Medications    Prior to Admission medications   Medication Sig Start Date End Date Taking? Authorizing  Provider  diltiazem (DILT-XR) 120 MG 24 hr capsule TAKE 2 CAPSULES BY MOUTH ONCE DAILY IN THE MORNING AND 1 CAPSULE ONCE DAILY IN THE EVENING Patient taking differently: Take 120 mg by mouth See admin instructions. TAKE 2 CAPSULES BY MOUTH ONCE DAILY IN THE MORNING AND 1 CAPSULE ONCE DAILY IN THE EVENING 11/06/17  Yes Satira Sark, MD  Fluticasone-Umeclidin-Vilant (TRELEGY ELLIPTA) 100-62.5-25 MCG/INH AEPB Inhale 1 puff into the lungs daily. 01/19/18  Yes Parrett, Fonnie Mu, NP  LYRICA 150 MG capsule TAKE ONE CAPSULE BY MOUTH THREE TIMES DAILY 05/23/14  Yes Marcial Pacas, MD  methimazole (TAPAZOLE) 5 MG tablet Take 1 tablet (5 mg total) by mouth 2 (two) times daily. 12/17/17  Yes Isaac Bliss, Rayford Halsted, MD  Oxycodone HCl 10 MG TABS Take 10 mg by mouth every 6 (six) hours as needed (pain).    Yes [provider]  predniSONE (DELTASONE) 5 MG tablet Take 1 tablet (5 mg total) by mouth daily with breakfast. Take one or two a day, taper as tolerated 01/04/18  Yes [name], MD  albuterol (PROVENTIL) (2.5 MG/3ML) 0.083% nebulizer solution Take 3 mLs (2.5 mg total) by nebulization every 6 (six) hours as needed for wheezing or shortness of breath. Patient not taking: Reported on 02/18/2018 01/04/18   [name], MD  budesonide-formoterol [facility]) 160-4.5 MCG/ACT inhaler Inhale 2 puffs into the lungs 2 (two) times daily. Patient not taking: Reported on 02/18/2018 11/17/17   Parrett, Fonnie Mu, NP  ipratropium (ATROVENT) 0.02 % nebulizer solution Take 2.5 mLs (0.5 mg total) by nebulization every 6 (six) hours as needed for wheezing or shortness of breath. Patient not taking: Reported on 02/18/2018 11/30/17   Collene Gobble, MD  Spacer/Aero-Holding Chambers (AEROCHAMBER MV) inhaler Use as instructed 10/21/16   Javier Glazier, MD  SPIRIVA RESPIMAT 2.5 MCG/ACT AERS INHALE TWO PUFFS BY MOUTH ONCE DAILY Patient not taking: Reported on 02/18/2018 12/19/16   Javier Glazier, MD    Family History Family History  Problem Relation Age of Onset  . COPD Mother   . Heart disease Brother        died at 56    Social History Social History   Tobacco Use  . Smoking status: Former Smoker    Packs/day: 2.00    Years: 40.00    Pack years: 80.00    Types: Cigarettes    Start date: 06/19/1954    Last attempt to quit: 09/15/1998    Years since quitting: 19.4  . Smokeless tobacco: Never Used  . Tobacco comment: Counseled to remain smoke free  Substance Use Topics  . Alcohol use: No    Alcohol/week: 0.0 oz  . Drug use: No     Allergies    Albuterol   Review of Systems Review of Systems ROS: Statement: All systems negative except as marked or noted in the HPI; Constitutional: Negative for fever and chills. ; ; Eyes: Negative for eye pain, redness and discharge. ; ; ENMT: Negative for ear pain, hoarseness, nasal congestion, sinus pressure and sore throat. ; ; Cardiovascular: Negative for chest pain, palpitations, diaphoresis, dyspnea and +RLE peripheral edema. ; ; Respiratory: Negative for cough, wheezing and stridor. ; ; Gastrointestinal: Negative for nausea, vomiting, diarrhea, abdominal pain, blood in stool, hematemesis, jaundice and rectal bleeding. . ; ; Genitourinary: Negative for dysuria, flank pain and hematuria. ; ; Musculoskeletal: Negative for back pain and neck pain. Negative for swelling and trauma.; ; Skin: +rash. Negative for  pruritus, abrasions, blisters, bruising and skin lesion.; ; Neuro: Negative for headache, lightheadedness and neck stiffness. Negative for weakness, altered level of consciousness, altered mental status, extremity weakness, paresthesias, involuntary movement, seizure and syncope.       Physical Exam Updated Vital Signs BP 119/71 (BP Location: Right Arm)   Pulse 80   Temp 98.2 F (36.8 C) (Oral)   Resp 19   Wt 70.3 kg (155 lb)   SpO2 97%   BMI 22.24 kg/m   Physical Exam 1115: Physical examination:  Nursing notes reviewed; Vital signs and O2 SAT reviewed;  Constitutional: Well developed, Well nourished, Well hydrated, In no acute distress; Head:  Normocephalic, atraumatic; Eyes: EOMI, PERRL, No scleral icterus; ENMT: Mouth and pharynx normal, Mucous membranes moist; Neck: Supple, Full range of motion, No lymphadenopathy; Cardiovascular: Regular rate and rhythm, No gallop; Respiratory: Breath sounds clear & equal bilaterally, No wheezes.  Speaking full sentences with ease, Normal respiratory effort/excursion; Chest: Nontender, Movement normal; Abdomen: Soft, Nontender, Nondistended, Normal  bowel sounds; Genitourinary: No CVA tenderness; Extremities: Peripheral pulses normal, No tenderness, +1 edema to RLE foot to knee with calf asymmetry. Mild erythema to R>L feet, no streaking erythema. Strong palp pedal pulses. NT right foot/ankle/calf/knee. No open wounds. No ecchymosis.; Neuro: AA&Ox3, Major CN grossly intact.  Speech clear. No gross focal motor or sensory deficits in extremities. Climbs on and off stretcher easily by himself. Gait steady..; Skin: Color normal, Warm, Dry.   ED Treatments / Results  Labs (all labs ordered are listed, but only abnormal results are displayed)   EKG None  Radiology   Procedures Procedures (including critical care time)  Medications Ordered in ED Medications - No data to display   Initial Impression / Assessment and Plan / ED Course  I have reviewed the triage vital signs and the nursing notes.  Pertinent labs & imaging results that were available during my care of the patient were reviewed by me and considered in my medical decision making (see chart for details).  MDM Reviewed: previous chart, nursing note and vitals Reviewed previous: labs Interpretation: labs and ultrasound    Results for orders placed or performed during the hospital encounter of 02/18/18  CBC with Differential  Result Value Ref Range   WBC 8.0 4.0 - 10.5 K/uL   RBC 4.92 4.22 - 5.81 MIL/uL   Hemoglobin 14.5 13.0 - 17.0 g/dL   HCT 44.7 39.0 - 52.0 %   MCV 90.9 78.0 - 100.0 fL   MCH 29.5 26.0 - 34.0 pg   MCHC 32.4 30.0 - 36.0 g/dL   RDW 13.8 11.5 - 15.5 %   Platelets 226 150 - 400 K/uL   Neutrophils Relative % 67 %   Neutro Abs 5.4 1.7 - 7.7 K/uL   Lymphocytes Relative 22 %   Lymphs Abs 1.8 0.7 - 4.0 K/uL   Monocytes Relative 9 %   Monocytes Absolute 0.7 0.1 - 1.0 K/uL   Eosinophils Relative 1 %   Eosinophils Absolute 0.1 0.0 - 0.7 K/uL   Basophils Relative 1 %   Basophils Absolute 0.0 0.0 - 0.1 K/uL  Basic metabolic panel  Result Value Ref  Range   Sodium 142 135 - 145 mmol/L   Potassium 4.1 3.5 - 5.1 mmol/L   Chloride 107 101 - 111 mmol/L   CO2 30 22 - 32 mmol/L   Glucose, Bld 96 65 - 99 mg/dL   BUN 8 6 - 20 mg/dL   Creatinine, Ser 1.01 0.61 - 1.24  mg/dL   Calcium 9.4 8.9 - 10.3 mg/dL   GFR calc non Af Amer >60 >60 mL/min   GFR calc Af Amer >60 >60 mL/min   Anion gap 5 5 - 15   US Venous Img Lower Right (dvt Study) Result Date: 02/18/2018 CLINICAL DATA:  Right lower extremity edema for the past 2 months. History of prostate cancer. Evaluate for DVT. EXAM: RIGHT LOWER EXTREMITY VENOUS DOPPLER ULTRASOUND TECHNIQUE: Gray-scale sonography with graded compression, as well as color Doppler and duplex ultrasound were performed to evaluate the lower extremity deep venous systems from the level of the common femoral vein and including the common femoral, femoral, profunda femoral, popliteal and calf veins including the posterior tibial, peroneal and gastrocnemius veins when visible. The superficial great saphenous vein was also interrogated. Spectral Doppler was utilized to evaluate flow at rest and with distal augmentation maneuvers in the common femoral, femoral and popliteal veins. COMPARISON:  None. FINDINGS: Contralateral Common Femoral Vein: Respiratory phasicity is normal and symmetric with the symptomatic side. No evidence of thrombus. Normal compressibility. Common Femoral Vein: No evidence of thrombus. Normal compressibility, respiratory phasicity and response to augmentation. Saphenofemoral Junction: No evidence of thrombus. Normal compressibility and flow on color Doppler imaging. Profunda Femoral Vein: No evidence of thrombus. Normal compressibility and flow on color Doppler imaging. Femoral Vein: No evidence of thrombus. Normal compressibility, respiratory phasicity and response to augmentation. Popliteal Vein: No evidence of thrombus. Normal compressibility, respiratory phasicity and response to augmentation. Calf Veins: No evidence  of thrombus. Normal compressibility and flow on color Doppler imaging. Superficial Great Saphenous Vein: No evidence of thrombus. Normal compressibility. Venous Reflux:  None. Other Findings:  None. IMPRESSION: No evidence of DVT within the right lower extremity. Electronically Signed   By: Sandi Mariscal M.D.   On: 02/18/2018 12:11    1410:  Workup reassuring. Will tx with abx for possible cellulitis. Tx swelling symptomatically at this time. Dx and testing d/w pt.  Questions answered.  Verb understanding, agreeable to d/c home with outpt f/u.    Final Clinical Impressions(s) / ED Diagnoses   Final diagnoses:  None    ED Discharge Orders    None       Francine Graven, DO 02/22/18 1651

## 2018-02-18 NOTE — ED Triage Notes (Signed)
Pt c/o right ankle swelling x weeks. Just dc as inpatient for PNA. Seen pcp today and told to come here for [place] to r/o DVT. Denies any more sob than usual since d/c. Nad. Swelling and warmth not to right lower leg and ankle.

## 2018-02-18 NOTE — Discharge Instructions (Signed)
Take the prescription as directed.  Elevate your leg as much as possible. Call your regular medical doctor today to schedule a follow up appointment within the next 3 days. Call the Vascular doctor today to schedule a follow up appointment within the next week.  Return to the Emergency Department immediately sooner if worsening.

## 2018-02-18 NOTE — ED Notes (Signed)
Pt refused to take pants and shirt off for assessment.

## 2018-02-19 ENCOUNTER — Other Ambulatory Visit: Payer: Self-pay | Admitting: Cardiology

## 2018-02-19 ENCOUNTER — Encounter: Payer: Self-pay | Admitting: Family Medicine

## 2018-03-02 ENCOUNTER — Ambulatory Visit (HOSPITAL_COMMUNITY)
Admission: RE | Admit: 2018-03-02 | Discharge: 2018-03-02 | Disposition: A | Discharge status: Home / Self Care | Payer: Medicare Other | Source: Ambulatory Visit | Attending: Adult Health | Admitting: Adult Health

## 2018-03-02 DIAGNOSIS — R918 Other nonspecific abnormal finding of lung field: Secondary | ICD-10-CM | POA: Insufficient documentation

## 2018-03-02 DIAGNOSIS — R911 Solitary pulmonary nodule: Secondary | ICD-10-CM | POA: Diagnosis not present

## 2018-03-02 DIAGNOSIS — J439 Emphysema, unspecified: Secondary | ICD-10-CM | POA: Insufficient documentation

## 2018-03-02 DIAGNOSIS — I2510 Atherosclerotic heart disease of native coronary artery without angina pectoris: Secondary | ICD-10-CM | POA: Insufficient documentation

## 2018-03-02 DIAGNOSIS — I700 Atherosclerosis of aorta: Secondary | ICD-10-CM | POA: Diagnosis not present

## 2018-03-04 ENCOUNTER — Telehealth: Payer: Self-pay | Admitting: Emergency Medicine

## 2018-03-04 DIAGNOSIS — R911 Solitary pulmonary nodule: Secondary | ICD-10-CM

## 2018-03-04 NOTE — Telephone Encounter (Signed)
Notes recorded by [name] [name], CMA on 03/04/2018 at 4:22 PM EDT lmtcb x1 for pt. ------  Notes recorded by [name] Needles, NP on 03/04/2018 at 10:57 AM EDT CAD noted-this is an incidental finding when he discussed with primary care physician if further evaluation is needed  All pulmonary nodules are stable without change. Needs CT chest in 1 year to follow lung nodules  Spoke with patient. He is aware of results. Verbalized understanding. Wishes to proceed with CT scan in 1 year and keep appt with RB in a few weeks.   Nothing else needed at time of call.

## 2018-03-16 ENCOUNTER — Other Ambulatory Visit: Payer: Self-pay | Admitting: Adult Health

## 2018-03-16 DIAGNOSIS — R918 Other nonspecific abnormal finding of lung field: Secondary | ICD-10-CM

## 2018-03-19 ENCOUNTER — Other Ambulatory Visit: Payer: Self-pay

## 2018-03-19 DIAGNOSIS — M7989 Other specified soft tissue disorders: Secondary | ICD-10-CM

## 2018-03-30 ENCOUNTER — Ambulatory Visit (HOSPITAL_COMMUNITY)
Admission: RE | Admit: 2018-03-30 | Discharge: 2018-03-30 | Disposition: A | Discharge status: Home / Self Care | Payer: Medicare Other | Source: Ambulatory Visit | Attending: Family | Admitting: Family

## 2018-03-30 DIAGNOSIS — M7989 Other specified soft tissue disorders: Secondary | ICD-10-CM

## 2018-03-31 ENCOUNTER — Ambulatory Visit (INDEPENDENT_AMBULATORY_CARE_PROVIDER_SITE_OTHER): Payer: Medicare Other | Admitting: Vascular Surgery

## 2018-03-31 ENCOUNTER — Other Ambulatory Visit: Payer: Self-pay

## 2018-03-31 ENCOUNTER — Encounter: Payer: Self-pay | Admitting: Vascular Surgery

## 2018-03-31 DIAGNOSIS — I872 Venous insufficiency (chronic) (peripheral): Secondary | ICD-10-CM | POA: Diagnosis not present

## 2018-03-31 NOTE — Progress Notes (Signed)
Requested by:  [name], MD [address] [place], [place] [address]  Reason for consultation: right ankle pain    History of Present Illness   [name] [name] is a 77 y.o. (Nov 21, 1940) male who presents with chief complaint: right ankle pain.  The patient was admitted in May for reportedly 12 days.  After such he started having pain in right ankle that "aching" in character moderate to severe in intensity.  He noticed increasing swelling with this pain.  He went to the ED for evaluation in June 2019, think this might be a blood clot.  Venous duplex was negative.  The patient notes pattern of increasing R foot and ankle swelling with extended standing.  The patient notes also pain radiating from medial ankle to foot.  The patient has had mp history of DVT, no history of varicose vein, no history of venous stasis ulcers, no history of  Lymphedema and no history of skin changes in lower legs.  There is no family history of venous disorders.  The patient has never used compression stockings in the past.  Past Medical History:  Diagnosis Date  . AAA (abdominal aortic aneurysm) ([place])   . COPD with emphysema ([place])    Dr. Joya Gaskins, Gold stage C  . DJD (degenerative joint disease), lumbar   . Emphysema of lung ([place])   . History of prostate cancer    Status post radioactive seed implants  . History of shingles   . Incomplete emptying of bladder   . Mixed hyperlipidemia   . PAF (paroxysmal atrial fibrillation) ([place])   . Palpitations   . Peripheral neuropathy   . Sciatic nerve pain    Secondary to shingles 2011  . Urethral stricture   . Wears partial dentures    Upper and lower    Past Surgical History:  Procedure Laterality Date  . CATARACT EXTRACTION W/ INTRAOCULAR LENS  IMPLANT, BILATERAL  2012  . COLONOSCOPY  02-14-2003  . CYSTO/  BALLOON DILATION AND INCISION BLADDER NECK CONTRACTURE  X5  LAST ONE 01-24-2009  . CYSTOSCOPY WITH URETHRAL DILATATION  09/15/2011   Procedure:  CYSTOSCOPY WITH URETHRAL DILATATION;  Surgeon: Bernestine Amass, MD;  Location: [facility];  Service: Urology;  Laterality: N/A;  CYSTOSCOPY, BALLOON DILATION   . CYSTOSCOPY WITH URETHRAL DILATATION N/A 04/03/2014   Procedure: CYSTOSCOPY WITH dilatation;  Surgeon: Bernestine Amass, MD;  Location: [facility];  Service: Urology;  Laterality: N/A;  . EYE SURGERY    . RADIOACTIVE PROSTATE SEED IMPLANTS/  CYSTO WITH BALLOON DILATION BLADDER NECK CONTRACTURE  11-26-2006    Social History   Socioeconomic History  . Marital status: Married    Spouse name: [name] [name]  . Number of children: 3  . Years of education: 9 th  . Highest education level: Not on file  Occupational History    Employer: RETIRED    Comment: Retired  Scientific laboratory technician  . Financial resource strain: Not hard at all  . Food insecurity:    Worry: Never true    Inability: Never true  . Transportation needs:    Medical: No    Non-medical: No  Tobacco Use  . Smoking status: Former Smoker    Packs/day: 2.00    Years: 40.00    Pack years: 80.00    Types: Cigarettes    Start date: 06/19/1954    Last attempt to quit: 09/15/1998    Years since quitting: 19.5  . Smokeless  tobacco: Never Used  . Tobacco comment: Counseled to remain smoke free  Substance and Sexual Activity  . Alcohol use: No    Alcohol/week: 0.0 oz  . Drug use: No  . Sexual activity: Never  Lifestyle  . Physical activity:    Days per week: 0 days    Minutes per session: 0 min  . Stress: Not at all  Relationships  . Social connections:    Talks on phone: Not on file    Gets together: Not on file    Attends religious service: Not on file    Active member of club or organization: Not on file    Attends meetings of clubs or organizations: Not on file    Relationship status: Not on file  . Intimate partner violence:    Fear of current or ex partner: No    Emotionally abused: No    Physically abused: No    Forced sexual activity: Not  on file  Other Topics Concern  . Not on file  Social History Narrative   Patient lives at home with [name] [name]). Patient is retired and has three daughters and one step daughter. Patient has a 9th grade education.   Right handed.   Caffeine- three  cups daily.      [facility]:   Originally from [place], [name]. Always lived in [name]. 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    Has a dog currently. No bird, mold, or asbestos exposure. 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     Family History  Problem Relation Age of Onset  . COPD Mother   . Heart disease Brother        died at 30    Current Outpatient Medications  Medication Sig Dispense Refill  . albuterol (PROVENTIL) (2.5 MG/3ML) 0.083% nebulizer solution Take 3 mLs (2.5 mg total) by nebulization every 6 (six) hours as needed for wheezing or shortness of breath. 75 mL 12  . diltiazem (DILT-XR) 120 MG 24 hr capsule TAKE 2 CAPSULES BY MOUTH IN THE MORNING AND 1 IN THE EVENING 270 capsule 1  . Fluticasone-Umeclidin-Vilant (TRELEGY ELLIPTA) 100-62.5-25 MCG/INH AEPB Inhale into the lungs.    Marland Kitchen LYRICA 150 MG capsule TAKE ONE CAPSULE BY MOUTH THREE TIMES DAILY 90 capsule 0  . methimazole (TAPAZOLE) 5 MG tablet Take 1 tablet (5 mg total) by mouth 2 (two) times daily. 60 tablet 2  . Oxycodone HCl 10 MG TABS Take 10 mg by mouth every 6 (six) hours as needed (pain).     . predniSONE (DELTASONE) 5 MG tablet Take 1 tablet (5 mg total) by mouth daily with breakfast. Take one or two a day, taper as tolerated 60 tablet 1   No current facility-administered medications for this visit.     Allergies  Allergen Reactions  . Albuterol Other (See Comments)    Caused extreme tachycardia. If used by continuous infusion.  May use the inhaler    REVIEW OF SYSTEMS (negative unless checked):   Cardiac:  []  Chest pain or chest pressure? [x]  Shortness of breath upon activity? []   Shortness of breath when lying flat? []  Irregular heart rhythm?  Vascular:  [x]  Pain in calf, thigh, or hip brought on by walking? []  Pain in feet at night that wakes you up from your sleep? []  Blood clot in your veins? [x]  Leg swelling?  Pulmonary:  []  Oxygen at home? []  Productive cough? []   Wheezing?  Neurologic:  []  Sudden weakness in arms or legs? []  Sudden numbness in arms or legs? []  Sudden onset of difficult speaking or slurred speech? []  Temporary loss of vision in one eye? []  Problems with dizziness?  Gastrointestinal:  []  Blood in stool? []  Vomited blood?  Genitourinary:  []  Burning when urinating? []  Blood in urine?  Psychiatric:  []  Major depression  Hematologic:  []  Bleeding problems? []  Problems with blood clotting?  Dermatologic:  []  Rashes or ulcers?  Constitutional:  []  Fever or chills?  Ear/Nose/Throat:  []  Change in hearing? []  Nose bleeds? []  Sore throat?  Musculoskeletal:  []  Back pain? []  Joint pain? []  Muscle pain?   Physical Examination     Vitals:   03/31/18 1546  BP: 128/71  Pulse: 75  Resp: 16  Temp: 98.4 F (36.9 C)  TempSrc: Oral  SpO2: 95%  Weight: 157 lb (71.2 kg)  Height: 5\' 10"  (1.778 m)   Body mass index is 22.53 kg/m.  General Alert, O x 3, WD, NAD  Head Gregory/AT,    Ear/Nose/ Throat Hearing grossly intact, nares without erythema or drainage, oropharynx without Erythema or Exudate, Mallampati score: 3,   Eyes PERRLA, EOMI,    Neck Supple, mid-line trachea,    Pulmonary Sym exp, good B air movt, CTA B  Cardiac RRR, Nl S1, S2, no Murmurs, No rubs, No S3,S4  Vascular Vessel Right Left  Radial Palpable Palpable  Brachial Palpable Palpable  Carotid Palpable, No Bruit Palpable, No Bruit  Aorta Not palpable N/A  Femoral Palpable Palpable  Popliteal Not palpable Not palpable  PT Palpable Palpable  DP Palpable Palpable    Gastro- intestinal soft, non-distended, non-tender to palpation, No guarding or  rebound, no HSM, no masses, no CVAT B, No palpable prominent aortic pulse,    Musculo- skeletal M/S 5/5 throughout  , Extremities without ischemic changes  , Non-pitting edema present: R distal calf and ankle, No visible varicosities , No Lipodermatosclerosis present, distal ankle and foot somewhat erythematous  Neurologic Cranial nerves 2-12 intact , Pain and light touch intact in extremities , Motor exam as listed above  Psychiatric Judgement intact, Mood & affect appropriate for pt's clinical situation  Dermatologic See M/S exam for extremity exam, No rashes otherwise noted  Lymphatic  Palpable lymph nodes: None    Non-invasive Vascular Imaging   RLE Venous Insufficiency Duplex (03/31/2018):   RLE:   no DVT and SVT,   + GSV reflux: 3.9-5.5 mm  no SSV reflux,  no deep venous reflux   Outside Studies/Documentation   4 pages of outside documents were reviewed including: recent ED reports.   Medical Decision Making   EMAN MORIMOTO is a 77 y.o. male who presents with: RLE chronic venous insufficiency (C3), R ankle pain   I doubt this patient's chronic venous insufficiency is the source of his pain.  He definitely has chronic venous insufficiency sx that will need to be managed initially with compressive therapy.  Further orthopedic work-up of the ankle and foot including X-ray are likely needed.  Thank you for allowing [place] to participate in this patient's care.   Adele Barthel, MD, FACS [facility] Office: [phone] Pager: [phone]  03/31/2018, 4:54 PM

## 2018-04-06 ENCOUNTER — Ambulatory Visit: Payer: Medicare Other | Admitting: Family Medicine

## 2018-04-20 ENCOUNTER — Encounter: Payer: Self-pay | Admitting: Emergency Medicine

## 2018-04-20 ENCOUNTER — Ambulatory Visit (INDEPENDENT_AMBULATORY_CARE_PROVIDER_SITE_OTHER): Payer: Medicare Other | Admitting: Emergency Medicine

## 2018-04-20 DIAGNOSIS — R918 Other nonspecific abnormal finding of lung field: Secondary | ICD-10-CM

## 2018-04-20 DIAGNOSIS — J449 Chronic obstructive pulmonary disease, unspecified: Secondary | ICD-10-CM

## 2018-04-20 MED ORDER — PREDNISONE 10 MG PO TABS: 10.0000 mg | ORAL_TABLET | Freq: Every day | ORAL | 5 refills | Status: DC

## 2018-04-20 NOTE — Assessment & Plan Note (Signed)
Multiple scattered nodules without significant interval change.  Repeat in June 2020 no contrast.

## 2018-04-20 NOTE — Progress Notes (Signed)
Subjective:    Patient ID: [name] [name], male    DOB: 1941/08/02, 77 y.o.   MRN: [id number]  HPI 77 year old former smoker (10 pack years) with a history of atrial fibrillation, prostate cancer, followed here most recently by Dr. Ashok Cordia for severe COPD and bilateral pulmonary nodules that have been tracked by serial CT scans.  He has had frequent exacerbations and has been seen in the emergency room at least 3 times since he was seen in this office. He reports chest congestion that he has difficulty clearing. Last was 11/18/17 > received pred and azithro. CXR that visit negative.   He uses albuterol about 2-3x a week when he is at baseline. Denies any nasal gtt or congestion. He can walk through the store, minimal exertion. Comfortable when still.  Sx are worst when the weather changes.   Most recent CT chest was 04/22/17 and I have reviewed.  This showed multiple bilateral pulmonary nodules of various sizes all less than 40mm, with a new small nodule noted in the right lower lobe in the left upper lobe.   ROV 04/20/18 --77 year old former heavy smoker with A. fib, prostate cancer, severe COPD and pulmonary nodules as outlined above.  He has frequent exacerbations that are characterized by more cough, dyspnea, mucus production.  Ultimately he was started on chronic prednisone and is currently on 5mg .  He underwent a CT scan of his chest on 03/02/2018 that I reviewed.  This shows overall stability of his multiple scattered pulmonary nodules, largest 7 mm.  Recommendation was to repeat a CT in June 2020.  At his last visit he was changed from Symbicort/Spiriva over to Trelegy to see if he would benefit and whether this was more cost effective. He begins to get more dyspnea at the end of the day. He has a POC, isn't sure that it is triggering adequately, that he is benefiting from it. He is using albuterol about 3x a day.    Review of Systems  Past Medical History:  Diagnosis Date  . AAA (abdominal aortic  aneurysm) ([place])   . COPD with emphysema ([place])    Dr. Joya Gaskins, Gold stage C  . DJD (degenerative joint disease), lumbar   . Emphysema of lung ([place])   . History of prostate cancer    Status post radioactive seed implants  . History of shingles   . Incomplete emptying of bladder   . Mixed hyperlipidemia   . PAF (paroxysmal atrial fibrillation) ([place])   . Palpitations   . Peripheral neuropathy   . Sciatic nerve pain    Secondary to shingles 2011  . Urethral stricture   . Wears partial dentures    Upper and lower     Family History  Problem Relation Age of Onset  . COPD Mother   . Heart disease Brother        died at 42     Social History   Socioeconomic History  . Marital status: Married    Spouse name: [name]  . Number of children: 3  . Years of education: 9 th  . Highest education level: Not on file  Occupational History    Employer: RETIRED    Comment: Retired  Scientific laboratory technician  . Financial resource strain: Not hard at all  . Food insecurity:    Worry: Never true    Inability: Never true  . Transportation needs:    Medical: No    Non-medical: No  Tobacco Use  . Smoking status:  Former Smoker    Packs/day: 2.00    Years: 40.00    Pack years: 80.00    Types: Cigarettes    Start date: 06/19/1954    Last attempt to quit: 09/15/1998    Years since quitting: 19.6  . Smokeless tobacco: Never Used  . Tobacco comment: Counseled to remain smoke free  Substance and Sexual Activity  . Alcohol use: No    Alcohol/week: 0.0 oz  . Drug use: No  . Sexual activity: Never  Lifestyle  . Physical activity:    Days per week: 0 days    Minutes per session: 0 min  . Stress: Not at all  Relationships  . Social connections:    Talks on phone: Not on file    Gets together: Not on file    Attends religious service: Not on file    Active member of club or organization: Not on file    Attends meetings of clubs or organizations: Not on file    Relationship status: Not on file  .  Intimate partner violence:    Fear of current or ex partner: No    Emotionally abused: No    Physically abused: No    Forced sexual activity: Not on file  Other Topics Concern  . Not on file  Social History Narrative   Patient lives at home with [name]). Patient is retired and has three daughters and one step daughter. Patient has a 9th grade education.   Right handed.   Caffeine- three  cups daily.      [facility]:   Originally from [place], [name]. Always lived in [name]. 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    Has a dog currently. No bird, mold, or asbestos exposure. 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      Allergies  Allergen Reactions  . Albuterol Other (See Comments)    Caused extreme tachycardia. If used by continuous infusion.  May use the inhaler     Outpatient Medications Prior to Visit  Medication Sig Dispense Refill  . albuterol (PROVENTIL) (2.5 MG/3ML) 0.083% nebulizer solution Take 3 mLs (2.5 mg total) by nebulization every 6 (six) hours as needed for wheezing or shortness of breath. 75 mL 12  . diltiazem (DILT-XR) 120 MG 24 hr capsule TAKE 2 CAPSULES BY MOUTH IN THE MORNING AND 1 IN THE EVENING 270 capsule 1  . Fluticasone-Umeclidin-Vilant (TRELEGY ELLIPTA) 100-62.5-25 MCG/INH AEPB Inhale into the lungs.    Marland Kitchen LYRICA 150 MG capsule TAKE ONE CAPSULE BY MOUTH THREE TIMES DAILY 90 capsule 0  . methimazole (TAPAZOLE) 5 MG tablet Take 1 tablet (5 mg total) by mouth 2 (two) times daily. 60 tablet 2  . Oxycodone HCl 10 MG TABS Take 10 mg by mouth every 6 (six) hours as needed (pain).     . predniSONE (DELTASONE) 5 MG tablet Take 1 tablet (5 mg total) by mouth daily with breakfast. Take one or two a day, taper as tolerated 60 tablet 1   No facility-administered medications prior to visit.         Objective:   Physical Exam Vitals:   04/20/18 1621  BP: 118/70  Pulse: 79  SpO2: 96%  Weight:  161 lb (73 kg)  Height: 5\' 10"  (1.778 m)   Gen: Pleasant, thin, in no distress,  normal affect  ENT: No lesions,  mouth clear,  oropharynx clear, no postnasal drip  Neck:  No JVD, no stridor  Lungs: No use of accessory muscles, distant, no wheeze  Cardiovascular: RRR, heart sounds normal, no murmur or gallops, no peripheral edema  Musculoskeletal: No deformities, no cyanosis or clubbing  Neuro: alert, non focal  Skin: Warm, no lesions or rash     Assessment & Plan:  COPD, severe ([place]) Continues to have exertional dyspnea especially at the end of the day.  He does like to Trelegy and wants to continue it.  He uses albuterol as needed and we will continue this.  I will increase his prednisone to 10 mg daily to see if he gets more benefit.  I will follow-up with him in 3 months to assess for interval improvement.  If he does not notice significant change then I would like to get him back down to the lowest effective dose.  Please continue your Trelegy once a day. Rinse and gargle after using Take albuterol 2 puffs up to every 4 hours if needed for shortness of breath.  Increase prednisone 10mg  once a day.  Continue your oxygen at 2L/min at night.  We will work on obtaining an alternative portable oxygen concentrator.  You may need to have a repeat walking oximetry in order to qualify for this. Follow with Dr Lamonte Sakai in 3 months or sooner if you have any problems.  Multiple lung nodules on CT Multiple scattered nodules without significant interval change.  Repeat in June 2020 no contrast.  Baltazar Apo, MD, PhD 04/20/2018, 4:54 PM [facility] [phone] or if no answer [phone]

## 2018-04-20 NOTE — Patient Instructions (Addendum)
Please continue your Trelegy once a day. Rinse and gargle after using Take albuterol 2 puffs up to every 4 hours if needed for shortness of breath.  Increase prednisone 10mg  once a day.  Continue your oxygen at 2L/min at night.  We will work on obtaining an alternative portable oxygen concentrator.  You may need to have a repeat walking oximetry in order to qualify for this. You need a repeat CT scan of the chest without contrast in June 2020 to follow pulmonary nodules, largest 7 mm. Follow with Dr Lamonte Sakai in 3 months or sooner if you have any problems.

## 2018-04-20 NOTE — Addendum Note (Signed)
Addended by: Desmond Dike C on: 04/20/2018 04:56 PM   Modules accepted: Orders

## 2018-04-20 NOTE — Assessment & Plan Note (Addendum)
Continues to have exertional dyspnea especially at the end of the day.  He does like to Trelegy and wants to continue it.  He uses albuterol as needed and we will continue this.  I will increase his prednisone to 10 mg daily to see if he gets more benefit.  I will follow-up with him in 3 months to assess for interval improvement.  If he does not notice significant change then I would like to get him back down to the lowest effective dose.  Please continue your Trelegy once a day. Rinse and gargle after using Take albuterol 2 puffs up to every 4 hours if needed for shortness of breath.  Increase prednisone 10mg  once a day.  Continue your oxygen at 2L/min at night.  We will work on obtaining an alternative portable oxygen concentrator.  You may need to have a repeat walking oximetry in order to qualify for this. Follow with Dr Lamonte Sakai in 3 months or sooner if you have any problems.

## 2018-04-22 ENCOUNTER — Encounter (HOSPITAL_COMMUNITY): Payer: Self-pay

## 2018-04-22 ENCOUNTER — Emergency Department (HOSPITAL_COMMUNITY): Payer: Medicare Other

## 2018-04-22 ENCOUNTER — Other Ambulatory Visit: Payer: Self-pay

## 2018-04-22 ENCOUNTER — Emergency Department (HOSPITAL_COMMUNITY)
Admission: EM | Admit: 2018-04-22 | Discharge: 2018-04-22 | Disposition: A | Discharge status: Home / Self Care | Payer: Medicare Other | Attending: Emergency Medicine | Admitting: Emergency Medicine

## 2018-04-22 DIAGNOSIS — Z87891 Personal history of nicotine dependence: Secondary | ICD-10-CM | POA: Insufficient documentation

## 2018-04-22 DIAGNOSIS — Z79899 Other long term (current) drug therapy: Secondary | ICD-10-CM | POA: Diagnosis not present

## 2018-04-22 DIAGNOSIS — J449 Chronic obstructive pulmonary disease, unspecified: Secondary | ICD-10-CM | POA: Insufficient documentation

## 2018-04-22 DIAGNOSIS — Z8546 Personal history of malignant neoplasm of prostate: Secondary | ICD-10-CM | POA: Insufficient documentation

## 2018-04-22 DIAGNOSIS — I714 Abdominal aortic aneurysm, without rupture: Secondary | ICD-10-CM | POA: Insufficient documentation

## 2018-04-22 DIAGNOSIS — R079 Chest pain, unspecified: Secondary | ICD-10-CM | POA: Insufficient documentation

## 2018-04-22 LAB — BASIC METABOLIC PANEL
ANION GAP: 9 (ref 5–15)
BUN: 19 mg/dL (ref 8–23)
CALCIUM: 9.4 mg/dL (ref 8.9–10.3)
CO2: 26 mmol/L (ref 22–32)
CREATININE: 1.08 mg/dL (ref 0.61–1.24)
Chloride: 104 mmol/L (ref 98–111)
Glucose, Bld: 92 mg/dL (ref 70–99)
Potassium: 3.6 mmol/L (ref 3.5–5.1)
SODIUM: 139 mmol/L (ref 135–145)

## 2018-04-22 LAB — CBC
HCT: 48.3 % (ref 39.0–52.0)
Hemoglobin: 16.7 g/dL (ref 13.0–17.0)
MCH: 30 pg (ref 26.0–34.0)
MCHC: 34.6 g/dL (ref 30.0–36.0)
MCV: 86.7 fL (ref 78.0–100.0)
PLATELETS: 206 10*3/uL (ref 150–400)
RBC: 5.57 MIL/uL (ref 4.22–5.81)
RDW: 13.6 % (ref 11.5–15.5)
WBC: 13.4 10*3/uL — AB (ref 4.0–10.5)

## 2018-04-22 LAB — TROPONIN I

## 2018-04-22 MED ORDER — NITROGLYCERIN 0.4 MG SL SUBL: 0.4000 mg | SUBLINGUAL_TABLET | SUBLINGUAL | 0 refills | Status: AC | PRN

## 2018-04-22 MED ORDER — ASPIRIN 325 MG PO TABS
325.0000 mg | ORAL_TABLET | Freq: Once | ORAL | Status: AC
  Administered 2018-04-22: 325 mg via ORAL
  Filled 2018-04-22: qty 1

## 2018-04-22 MED ORDER — NITROGLYCERIN 0.4 MG SL SUBL: 0.4000 mg | SUBLINGUAL_TABLET | SUBLINGUAL | 0 refills | Status: DC | PRN

## 2018-04-22 MED ORDER — NITROGLYCERIN 0.4 MG SL SUBL
0.4000 mg | SUBLINGUAL_TABLET | Freq: Once | SUBLINGUAL | Status: AC
  Administered 2018-04-22: 0.4 mg via SUBLINGUAL
  Filled 2018-04-22: qty 1

## 2018-04-22 NOTE — ED Provider Notes (Signed)
[facility] EMERGENCY DEPARTMENT Provider Note   CSN: [id number] Arrival date & time: 04/22/18  1015     History   Chief Complaint Chief Complaint  Patient presents with  . Chest Pain    HPI [name] [name] is a 78 y.o. male.  Patient reports chest pain described as a "bear hug" approximately 1 hour prior to admission with associated dyspnea, but no diaphoresis or nausea.  He has known history of COPD, but no CAD, hypertension, diabetes.  He is a longtime smoker but quit several years ago.  He wears nasal oxygen at night.  Severity of symptoms is moderate.  Patient was brushing his teeth when symptoms started.     Past Medical History:  Diagnosis Date  . AAA (abdominal aortic aneurysm) ([place])   . Cancer [facility])    prostate  . COPD with emphysema ([place])    Dr. Joya Gaskins, Gold stage C  . DJD (degenerative joint disease), lumbar   . Emphysema of lung ([place])   . History of prostate cancer    Status post radioactive seed implants  . History of shingles   . Incomplete emptying of bladder   . Mixed hyperlipidemia   . PAF (paroxysmal atrial fibrillation) ([place])   . Palpitations   . Peripheral neuropathy   . Sciatic nerve pain    Secondary to shingles 2011  . Urethral stricture   . Wears partial dentures    Upper and lower    Patient Active Problem List   Diagnosis Date Noted  . Chronic venous insufficiency 03/31/2018  . SIRS (systemic inflammatory response syndrome) ([place]) 12/15/2017  . Chronic respiratory failure ([place]) 12/15/2017  . Elevated lactic acid level   . Hypotension   . Tachycardia 12/14/2017  . Malnutrition of moderate degree 12/09/2017  . Sepsis ([place]) 12/07/2017  . Protein-calorie malnutrition, severe 11/27/2017  . Orthostatic hypotension 11/25/2017  . Leukocytosis 11/25/2017  . Abdominal aortic aneurysm (AAA) without rupture ([place]) 10/27/2017  . Multiple lung nodules on CT 05/14/2016  . COPD, severe ([place]) 02/19/2016  . Emphysema of lung ([place]) 02/19/2016  .  Personal history of prostate cancer   . Mixed hyperlipidemia   . DJD (degenerative joint disease), lumbar   . Neuropathy   . Back pain   . 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 ATRIAL FIBRILLATION, PAROXYSMAL 08/31/2007    Past Surgical History:  Procedure Laterality Date  . CATARACT EXTRACTION W/ INTRAOCULAR LENS  IMPLANT, BILATERAL  2012  . COLONOSCOPY  02-14-2003  . CYSTO/  BALLOON DILATION AND INCISION BLADDER NECK CONTRACTURE  X5  LAST ONE 01-24-2009  . CYSTOSCOPY WITH URETHRAL DILATATION  09/15/2011   Procedure: CYSTOSCOPY WITH URETHRAL DILATATION;  Surgeon: Bernestine Amass, MD;  [facility];  Service: Urology;  Laterality: N/A;  CYSTOSCOPY, BALLOON DILATION   . CYSTOSCOPY WITH URETHRAL DILATATION N/A 04/03/2014   Procedure: CYSTOSCOPY WITH dilatation;  Surgeon: Bernestine Amass, MD;  Location: [facility];  Service: Urology;  Laterality: N/A;  . EYE SURGERY    . RADIOACTIVE PROSTATE SEED IMPLANTS/  CYSTO WITH BALLOON DILATION BLADDER NECK CONTRACTURE  11-26-2006        Home Medications    Prior to Admission medications   Medication Sig Start Date End Date Taking? Authorizing Provider  albuterol (PROVENTIL) (2.5 MG/3ML) 0.083% nebulizer solution Take 3 mLs (2.5 mg total) by nebulization every 6 (six) hours as needed for wheezing or shortness of breath. 01/04/18  Yes [name], MD  diltiazem (DILT-XR)  120 MG 24 hr capsule TAKE 2 CAPSULES BY MOUTH IN THE MORNING AND 1 IN THE EVENING 02/19/18  Yes Satira Sark, MD  Fluticasone-Umeclidin-Vilant (TRELEGY ELLIPTA) 100-62.5-25 MCG/INH AEPB Inhale into the lungs.   Yes [provider]  LYRICA 150 MG capsule TAKE ONE CAPSULE BY MOUTH THREE TIMES DAILY 05/23/14  Yes Marcial Pacas, MD  methimazole (TAPAZOLE) 5 MG tablet Take 1 tablet (5 mg total) by mouth 2 (two) times daily. 12/17/17  Yes Isaac Bliss, Rayford Halsted, MD  Oxycodone HCl 10 MG TABS Take 10 mg by mouth every 6 (six) hours  as needed (pain).    Yes [provider]  predniSONE (DELTASONE) 10 MG tablet Take 1 tablet (10 mg total) by mouth daily with breakfast. 04/20/18  Yes Byrum, Rose Fillers, MD  nitroGLYCERIN (NITROSTAT) 0.4 MG SL tablet Place 1 tablet (0.4 mg total) under the tongue every 5 (five) minutes as needed for chest pain. 04/22/18   Nat Christen, MD  predniSONE (DELTASONE) 5 MG tablet Take 1 tablet (5 mg total) by mouth daily with breakfast. Take one or two a day, taper as tolerated Patient not taking: Reported on 04/22/2018 01/04/18   [name], MD    Family History Family History  Problem Relation Age of Onset  . COPD Mother   . Heart disease Brother        died at 20    Social History Social History   Tobacco Use  . Smoking status: Former Smoker    Packs/day: 2.00    Years: 40.00    Pack years: 80.00    Types: Cigarettes    Start date: 06/19/1954    Last attempt to quit: 09/15/1998    Years since quitting: 19.6  . Smokeless tobacco: Never Used  . Tobacco comment: Counseled to remain smoke free  Substance Use Topics  . Alcohol use: No    Alcohol/week: 0.0 standard drinks  . Drug use: No     Allergies   Albuterol   Review of Systems Review of Systems  All other systems reviewed and are negative.    Physical Exam Updated Vital Signs BP 115/73   Pulse 73   Temp 98 F (36.7 C)   Resp 19   Ht 5\' 10"  (1.778 m)   Wt 72.6 kg   SpO2 96%   BMI 22.96 kg/m   Physical Exam  Constitutional: He is oriented to person, place, and time. He appears well-developed and well-nourished.  HENT:  Head: Normocephalic and atraumatic.  Eyes: Conjunctivae are normal.  Neck: Neck supple.  Cardiovascular: Normal rate and regular rhythm.  Pulmonary/Chest: Effort normal and breath sounds normal.  Abdominal: Soft. Bowel sounds are normal.  Musculoskeletal: Normal range of motion.  Neurological: He is alert and oriented to person, place, and time.  Skin: Skin is warm and dry.    Psychiatric: He has a normal mood and affect. His behavior is normal.  Nursing note and vitals reviewed.    ED Treatments / Results  Labs (all labs ordered are listed, but only abnormal results are displayed) Labs Reviewed  CBC - Abnormal; Notable for the following components:      Result Value   WBC 13.4 (*)    All other components within normal limits  BASIC METABOLIC PANEL  TROPONIN I  TROPONIN I    EKG EKG Interpretation  Date/Time:  Thursday April 22 2018 10:24:56 EDT Ventricular Rate:  79 PR Interval:    QRS Duration: 73 QT Interval:  370 QTC Calculation: 425 R Axis:   75 Text Interpretation:  Sinus rhythm RSR' in V1 or V2, probably normal variant Probable anteroseptal infarct, old Confirmed by Nat Christen [phone]) on 04/22/2018 10:39:19 AM   Radiology Dg Chest 2 View  Result Date: 04/22/2018 CLINICAL DATA:  77 year old male with a history of chest pain EXAM: CHEST - 2 VIEW COMPARISON:  CT 03/02/2018, 12/14/2017 FINDINGS: Cardiomediastinal silhouette unchanged in size and contour. Stigmata of emphysema, with increased retrosternal airspace, flattened hemidiaphragms, increased AP diameter, and hyperinflation on the AP view. Coarsened interstitial markings, similar to the comparison chest x-ray. Nodules identified on prior CT are not well visualized on the current chest x-ray. No pneumothorax. No pleural effusion. No new confluent airspace disease. IMPRESSION: Chronic lung changes and emphysema without evidence of superimposed acute cardiopulmonary disease Electronically Signed   By: Corrie Mckusick D.O.   On: 04/22/2018 11:54    Procedures Procedures (including critical care time)  Medications Ordered in ED Medications  aspirin tablet 325 mg (325 mg Oral Given 04/22/18 1057)  nitroGLYCERIN (NITROSTAT) SL tablet 0.4 mg (0.4 mg Sublingual Given 04/22/18 1057)     Initial Impression / Assessment and Plan / ED Course  I have reviewed the triage vital signs and the nursing  notes.  Pertinent labs & imaging results that were available during my care of the patient were reviewed by me and considered in my medical decision making (see chart for details).     History and physical most consistent with angina.  EKG, delta troponin, chest x-ray negative.  He responded well to nitroglycerin.  Patient did not want to be admitted to the hospital.  He will follow-up as an outpatient with cardiology.  This was discussed with the patient and his wife.  Final Clinical Impressions(s) / ED Diagnoses   Final diagnoses:  Chest pain, unspecified type    ED Discharge Orders         Ordered    nitroGLYCERIN (NITROSTAT) 0.4 MG SL tablet  Every 5 min PRN,   Status:  Discontinued     04/22/18 1419    nitroGLYCERIN (NITROSTAT) 0.4 MG SL tablet  Every 5 min PRN     04/22/18 1420           Nat Christen, MD 04/22/18 1519

## 2018-04-22 NOTE — Discharge Instructions (Addendum)
Tests showed no life-threatening condition.  However, I think this pain is caused from angina.  Recommend daily aspirin.  Prescription for nitroglycerin.  Follow-up with cardiologist or return if worse.

## 2018-04-22 NOTE — ED Triage Notes (Signed)
Pt reports chest pain that started about an hour ago while he was brushing teeth. Pain across chest and no radiation into arms. Reports SOB and wears o2 at night

## 2018-05-04 NOTE — Progress Notes (Signed)
Cardiology Office Note  Date: 05/05/2018    ID: [name], [name] 06-15-1941, MRN [id number]  PCP: [name] Squibb, MD  Primary Cardiologist: [name] Lesches, MD   Chief Complaint  Patient presents with  . ER follow-up    History of Present Illness: [name] [name] is a 77 y.o. male last seen in December 2018.  He is here for a follow-up visit. I reviewed his recent chart, he was seen in the ER in early August with an episode of chest discomfort.  He was seen by Dr. Lacinda Axon.  Episode was felt to be potentially anginal based on description however his troponin I levels were negative.  I reviewed the recent tracing.  He tells me that he experienced a very intense chest discomfort like a squeezing sensation after he got up in the morning and was brushing his teeth.  This lasted until he got to the ER, eventually went away after taking nitroglycerin.  He has had no recurrence since that time.  He has a history of paroxysmal atrial fibrillation.  CHADSVASC score is 3. He has not wanted to pursue anticoagulation.  He did not tolerate Eliquis due to significant nosebleeds.  He is also somewhat hesitant to take aspirin due to easy bleeding on his forearms.  Today we discussed arranging formal ischemic testing for further evaluation.  Past Medical History:  Diagnosis Date  . AAA (abdominal aortic aneurysm) ([place])   . COPD with emphysema ([place])    Dr. Joya Gaskins, Gold stage C  . DJD (degenerative joint disease), lumbar   . Emphysema of lung ([place])   . History of prostate cancer    Status post radioactive seed implants  . History of shingles   . Incomplete emptying of bladder   . Mixed hyperlipidemia   . PAF (paroxysmal atrial fibrillation) ([place])   . Palpitations   . Peripheral neuropathy   . Prostate cancer ([place])   . Sciatic nerve pain    Secondary to shingles 2011  . Urethral stricture   . Wears partial dentures    Upper and lower    Past Surgical History:  Procedure Laterality Date  .  CATARACT EXTRACTION W/ INTRAOCULAR LENS  IMPLANT, BILATERAL  2012  . COLONOSCOPY  02-14-2003  . CYSTO/  BALLOON DILATION AND INCISION BLADDER NECK CONTRACTURE  X5  LAST ONE 01-24-2009  . CYSTOSCOPY WITH URETHRAL DILATATION  09/15/2011   Procedure: CYSTOSCOPY WITH URETHRAL DILATATION;  Surgeon: Bernestine Amass, MD;  [facility];  Service: Urology;  Laterality: N/A;  CYSTOSCOPY, BALLOON DILATION   . CYSTOSCOPY WITH URETHRAL DILATATION N/A 04/03/2014   Procedure: CYSTOSCOPY WITH dilatation;  Surgeon: Bernestine Amass, MD;  [facility];  Service: Urology;  Laterality: N/A;  . EYE SURGERY    . RADIOACTIVE PROSTATE SEED IMPLANTS/  CYSTO WITH BALLOON DILATION BLADDER NECK CONTRACTURE  11-26-2006    Current Outpatient Medications  Medication Sig Dispense Refill  . albuterol (PROVENTIL) (2.5 MG/3ML) 0.083% nebulizer solution Take 3 mLs (2.5 mg total) by nebulization every 6 (six) hours as needed for wheezing or shortness of breath. 75 mL 12  . diltiazem (DILT-XR) 120 MG 24 hr capsule TAKE 2 CAPSULES BY MOUTH IN THE MORNING AND 1 IN THE EVENING 270 capsule 1  . Fluticasone-Umeclidin-Vilant (TRELEGY ELLIPTA) 100-62.5-25 MCG/INH AEPB Inhale into the lungs.    Marland Kitchen LYRICA 150 MG capsule TAKE ONE CAPSULE BY MOUTH THREE TIMES DAILY 90 capsule 0  . methimazole (TAPAZOLE) 5  MG tablet Take 1 tablet (5 mg total) by mouth 2 (two) times daily. 60 tablet 2  . nitroGLYCERIN (NITROSTAT) 0.4 MG SL tablet Place 1 tablet (0.4 mg total) under the tongue every 5 (five) minutes as needed for chest pain. 30 tablet 0  . Oxycodone HCl 10 MG TABS Take 10 mg by mouth every 6 (six) hours as needed (pain).     . predniSONE (DELTASONE) 10 MG tablet Take 1 tablet (10 mg total) by mouth daily with breakfast. 30 tablet 5  . aspirin EC 81 MG tablet Take 1 tablet (81 mg total) by mouth every other day. 30 tablet 0   No current facility-administered medications for this visit.    Allergies:   Albuterol   Social History: The patient  reports that he quit smoking about 19 years ago. His smoking use included cigarettes. He started smoking about 63 years ago. He has a 80.00 pack-year smoking history. He has never used smokeless tobacco. He reports that he does not drink alcohol or use drugs.   ROS:  Please see the history of present illness. Otherwise, complete review of systems is positive for chronic shortness of breath.  All other systems are reviewed and negative.   Physical Exam: VS:  BP 130/65   Pulse 80   Ht 5\' 10"  (1.778 m)   Wt 162 lb 9.6 oz (73.8 kg)   SpO2 95%   BMI 23.33 kg/m , BMI Body mass index is 23.33 kg/m.  Wt Readings from Last 3 Encounters:  05/05/18 162 lb 9.6 oz (73.8 kg)  04/22/18 160 lb (72.6 kg)  04/20/18 161 lb (73 kg)    General: Patient appears comfortable at rest. HEENT: Conjunctiva and lids normal, oropharynx clear. Neck: Supple, no elevated JVP or carotid bruits, no thyromegaly. Lungs: Decreased breath sounds without wheezing, nonlabored breathing at rest. Cardiac: Regular rate and rhythm, no S3 or significant systolic murmur. Abdomen: Soft, nontender, bowel sounds present. Extremities: No pitting edema, distal pulses 2+. Skin: Warm and dry. Musculoskeletal: No kyphosis. Neuropsychiatric: Alert and oriented x3, affect grossly appropriate.  ECG: I personally reviewed the tracing from 04/22/2018 which showed sinus rhythm with R' in lead V1.  Recent Labwork: 11/25/2017: B Natriuretic Peptide 59.0 12/09/2017: Magnesium 2.2 12/15/2017: ALT 29; AST 31 12/16/2017: TSH 0.177 04/22/2018: BUN 19; Creatinine, Ser 1.08; Hemoglobin 16.7; Platelets 206; Potassium 3.6; Sodium 139     Component Value Date/Time   CHOL 154 10/27/2017 1435   TRIG 64 10/27/2017 1435   HDL 62 10/27/2017 1435   CHOLHDL 2.5 10/27/2017 1435   [place] 78 10/27/2017 1435    Other Studies Reviewed Today:  Echocardiogram 11/26/2017: Study Conclusions  - Left ventricle: The  cavity size was normal. Wall thickness was at   the upper limits of normal. Systolic function was vigorous. The   estimated ejection fraction was in the range of 70% to 75% with   mild intracavitary gradient. Wall motion was normal; there were   no regional wall motion abnormalities. Doppler parameters are   consistent with abnormal left ventricular relaxation (grade 1   diastolic dysfunction). - Aortic valve: Mildly calcified annulus. Trileaflet. - Mitral valve: There was trivial regurgitation. - Right atrium: Central venous pressure (est): 3 mm Hg. - Atrial septum: No defect or patent foramen ovale was identified. - Tricuspid valve: There was trivial regurgitation. - Pulmonary arteries: Systolic pressure could not be accurately   estimated. - Pericardium, extracardiac: A prominent pericardial fat pad was   present versus organized anterior  pericardial effusion.  Assessment and Plan:  1.  Episode of chest pain as outlined above, possibly anginal.  He has had no recurrence, does have a prescription now for sublingual nitroglycerin.  I asked him to consider going back on aspirin 81 mg at least every other day.  We will arrange a [place] for further ischemic testing.  He does have COPD, but reports no progressive wheezing or recent decompensations.  2.  COPD, followed in the pulmonary division.  3.  Paroxysmal atrial fibrillation, CHADSVASC score is 3.  He continues on Cardizem CD and has declined anticoagulation.  Current medicines were reviewed with the patient today.   Orders Placed This Encounter  Procedures  . NM Myocar Multi W/Spect W/Wall Motion / EF    Disposition: Call with test results and follow-up in 4 to 6 weeks.  Signed, Satira Sark, MD, [facility] 05/05/2018 4:43 PM    [place] [facility] [place], [place], [place] [address] Phone: [phone]; Fax: [phone]

## 2018-05-05 ENCOUNTER — Telehealth: Payer: Self-pay | Admitting: Cardiology

## 2018-05-05 ENCOUNTER — Ambulatory Visit (INDEPENDENT_AMBULATORY_CARE_PROVIDER_SITE_OTHER): Payer: Medicare Other | Admitting: Cardiology

## 2018-05-05 ENCOUNTER — Encounter: Payer: Self-pay | Admitting: Cardiology

## 2018-05-05 VITALS — BP 130/65 | HR 80 | Ht 70.0 in | Wt 162.6 lb

## 2018-05-05 DIAGNOSIS — I209 Angina pectoris, unspecified: Secondary | ICD-10-CM

## 2018-05-05 DIAGNOSIS — J449 Chronic obstructive pulmonary disease, unspecified: Secondary | ICD-10-CM

## 2018-05-05 DIAGNOSIS — I480 Paroxysmal atrial fibrillation: Secondary | ICD-10-CM | POA: Diagnosis not present

## 2018-05-05 MED ORDER — ASPIRIN EC 81 MG PO TBEC: 81.0000 mg | DELAYED_RELEASE_TABLET | ORAL | 0 refills | Status: DC

## 2018-05-05 NOTE — Telephone Encounter (Signed)
°  Precert needed for: Lexi- angina   Location: [facility]   Date: May 12, 2018

## 2018-05-05 NOTE — Patient Instructions (Signed)
Medication Instructions:  Your physician has recommended you make the following change in your medication:    START Aspirin 81 mg every other day    Please continue all other medications as prescribed  Labwork: NONE  Testing/Procedures: Your physician has requested that you have a lexiscan myoview. For further information please visit [email or web address]. Please follow instruction sheet, as given.  Follow-Up: Your physician recommends that you schedule a follow-up appointment in: 4-6 [place]  Any Other Special Instructions Will Be Listed Below (If Applicable).  If you need a refill on your cardiac medications before your next appointment, please call your pharmacy.

## 2018-05-12 ENCOUNTER — Encounter (HOSPITAL_COMMUNITY): Payer: Self-pay

## 2018-05-12 ENCOUNTER — Encounter (HOSPITAL_COMMUNITY)
Admission: RE | Admit: 2018-05-12 | Discharge: 2018-05-12 | Disposition: A | Discharge status: Home / Self Care | Payer: Medicare Other | Source: Ambulatory Visit | Attending: Cardiology | Admitting: Cardiology

## 2018-05-12 ENCOUNTER — Encounter (HOSPITAL_BASED_OUTPATIENT_CLINIC_OR_DEPARTMENT_OTHER)
Admission: RE | Admit: 2018-05-12 | Discharge: 2018-05-12 | Disposition: A | Discharge status: Home / Self Care | Payer: Medicare Other | Source: Ambulatory Visit | Attending: Cardiology | Admitting: Cardiology

## 2018-05-12 DIAGNOSIS — I209 Angina pectoris, unspecified: Secondary | ICD-10-CM | POA: Diagnosis not present

## 2018-05-12 HISTORY — DX: Unspecified asthma, uncomplicated: J45.909

## 2018-05-12 LAB — NM MYOCAR MULTI W/SPECT W/WALL MOTION / EF
CHL CUP NUCLEAR SDS: 1
LHR: 0.29
LV dias vol: 67 mL (ref 62–150)
LV sys vol: 14 mL
Peak HR: 88 {beats}/min
Rest HR: 67 {beats}/min
SRS: 0
SSS: 1
TID: 0.87

## 2018-05-12 MED ORDER — REGADENOSON 0.4 MG/5ML IV SOLN
INTRAVENOUS | Status: AC
  Administered 2018-05-12: 0.4 mg via INTRAVENOUS
  Filled 2018-05-12: qty 5

## 2018-05-12 MED ORDER — TECHNETIUM TC 99M TETROFOSMIN IV KIT
10.0000 | PACK | Freq: Once | INTRAVENOUS | Status: AC | PRN
Start: 2018-05-12 — End: 2018-05-12
  Administered 2018-05-12: 11 via INTRAVENOUS

## 2018-05-12 MED ORDER — TECHNETIUM TC 99M TETROFOSMIN IV KIT
30.0000 | PACK | Freq: Once | INTRAVENOUS | Status: AC | PRN
  Administered 2018-05-12: 33 via INTRAVENOUS

## 2018-05-12 MED ORDER — SODIUM CHLORIDE 0.9% FLUSH
INTRAVENOUS | Status: AC
  Administered 2018-05-12: 10 mL via INTRAVENOUS
  Filled 2018-05-12: qty 10

## 2018-06-03 ENCOUNTER — Encounter: Payer: Self-pay | Admitting: General Surgery

## 2018-06-03 ENCOUNTER — Ambulatory Visit (INDEPENDENT_AMBULATORY_CARE_PROVIDER_SITE_OTHER): Payer: Medicare Other | Admitting: General Surgery

## 2018-06-03 VITALS — BP 125/77 | HR 73 | Temp 97.5°F | Resp 22 | Wt 167.0 lb

## 2018-06-03 DIAGNOSIS — K6289 Other specified diseases of anus and rectum: Secondary | ICD-10-CM | POA: Diagnosis not present

## 2018-06-03 DIAGNOSIS — I209 Angina pectoris, unspecified: Secondary | ICD-10-CM

## 2018-06-03 NOTE — Progress Notes (Signed)
[name] [name]; [id number]; 29-Sep-1940   HPI Patient is a 77 year old white male who was referred to my care by Dr. Allyn Kenner for evaluation and treatment of a cystic lesion in the perineal region.  Patient states is been present for some time now.  It seems to become tender to touch recently.  He denies any drainage from that region.  He denies any fever or chills.  He has a pain level of 2 out of 10.  It seems to be somewhat enlarging.  He does have a history of prostate cancer, treated with implanted seed radiation.  This was done in the remote past. Past Medical History:  Diagnosis Date  . AAA (abdominal aortic aneurysm) ([place])   . Asthma   . COPD with emphysema ([place])    Dr. Joya Gaskins, Gold stage C  . DJD (degenerative joint disease), lumbar   . Emphysema of lung ([place])   . History of prostate cancer    Status post radioactive seed implants  . History of shingles   . Incomplete emptying of bladder   . Mixed hyperlipidemia   . PAF (paroxysmal atrial fibrillation) ([place])   . Palpitations   . Peripheral neuropathy   . Prostate cancer ([place])   . Sciatic nerve pain    Secondary to shingles 2011  . Urethral stricture   . Wears partial dentures    Upper and lower    Past Surgical History:  Procedure Laterality Date  . CATARACT EXTRACTION W/ INTRAOCULAR LENS  IMPLANT, BILATERAL  2012  . COLONOSCOPY  02-14-2003  . CYSTO/  BALLOON DILATION AND INCISION BLADDER NECK CONTRACTURE  X5  LAST ONE 01-24-2009  . CYSTOSCOPY WITH URETHRAL DILATATION  09/15/2011   Procedure: CYSTOSCOPY WITH URETHRAL DILATATION;  Surgeon: Bernestine Amass, MD;  Location[facility];  Service: Urology;  Laterality: N/A;  CYSTOSCOPY, BALLOON DILATION   . CYSTOSCOPY WITH URETHRAL DILATATION N/A 04/03/2014   Procedure: CYSTOSCOPY WITH dilatation;  Surgeon: Bernestine Amass, MD;  [facility];  Service: Urology;  Laterality: N/A;  . EYE SURGERY    . RADIOACTIVE PROSTATE SEED IMPLANTS/  CYSTO  WITH BALLOON DILATION BLADDER NECK CONTRACTURE  11-26-2006    Family History  Problem Relation Age of Onset  . COPD Mother   . Heart disease Brother        died at 32    Current Outpatient Medications on File Prior to Visit  Medication Sig Dispense Refill  . aspirin EC 81 MG tablet Take 1 tablet (81 mg total) by mouth every other day. 30 tablet 0  . diltiazem (DILT-XR) 120 MG 24 hr capsule TAKE 2 CAPSULES BY MOUTH IN THE MORNING AND 1 IN THE EVENING 270 capsule 1  . Fluticasone-Umeclidin-Vilant (TRELEGY ELLIPTA) 100-62.5-25 MCG/INH AEPB Inhale into the lungs.    Marland Kitchen LYRICA 150 MG capsule TAKE ONE CAPSULE BY MOUTH THREE TIMES DAILY 90 capsule 0  . methimazole (TAPAZOLE) 5 MG tablet Take 1 tablet (5 mg total) by mouth 2 (two) times daily. 60 tablet 2  . nitroGLYCERIN (NITROSTAT) 0.4 MG SL tablet Place 1 tablet (0.4 mg total) under the tongue every 5 (five) minutes as needed for chest pain. 30 tablet 0  . Oxycodone HCl 10 MG TABS Take 10 mg by mouth every 6 (six) hours as needed (pain).     . predniSONE (DELTASONE) 10 MG tablet Take 1 tablet (10 mg total) by mouth daily with breakfast. 30 tablet 5   No current facility-administered  medications on file prior to visit.     Allergies  Allergen Reactions  . Albuterol Other (See Comments)    Caused extreme tachycardia. If used by continuous infusion.  May use the inhaler    Social History   Substance and Sexual Activity  Alcohol Use No  . Alcohol/week: 0.0 standard drinks    Social History   Tobacco Use  Smoking Status Former Smoker  . Packs/day: 2.00  . Years: 40.00  . Pack years: 80.00  . Types: Cigarettes  . Start date: 06/19/1954  . Last attempt to quit: 09/15/1998  . Years since quitting: 19.7  Smokeless Tobacco Never Used  Tobacco Comment   Counseled to remain smoke free    Review of Systems  Constitutional: Positive for malaise/fatigue.  HENT: Negative.   Eyes: Negative.   Respiratory: Positive for shortness of  breath and wheezing. Negative for cough.   Cardiovascular: Negative.   Gastrointestinal: Negative.   Genitourinary: Negative.   Musculoskeletal: Positive for back pain.  Skin: Negative.   Neurological: Negative.   Endo/Heme/Allergies: Negative.   Psychiatric/Behavioral: Negative.     Objective   Vitals:   06/03/18 0925  BP: 125/77  Pulse: 73  Resp: (!) 22  Temp: (!) 97.5 F (36.4 C)    Physical Exam  Constitutional: He is oriented to person, place, and time. He appears well-developed and well-nourished. No distress.  HENT:  Head: Normocephalic and atraumatic.  Cardiovascular: Normal rate, regular rhythm and normal heart sounds. Exam reveals no gallop and no friction rub.  No murmur heard. Pulmonary/Chest: Effort normal and breath sounds normal. No stridor. No respiratory distress. He has no wheezes. He has no rales.  Genitourinary:  Genitourinary Comments: Small cyst with tenderness at the 11 o'clock position just outside the anal verge.  No fluctuance is present.  Rectal examination unremarkable.  Neurological: He is alert and oriented to person, place, and time.  Skin: Skin is warm and dry.  Vitals reviewed.   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  The risks and benefits of the procedure including bleeding, infection, recurrence of the cyst were fully explained to the patient, who gave informed consent.

## 2018-06-03 NOTE — H&P (Signed)
[name] [name]; [id number]; July 09, 1941   HPI Patient is a 77 year old white male who was referred to my care by Dr. Allyn Kenner for evaluation and treatment of a cystic lesion in the perineal region.  Patient states is been present for some time now.  It seems to become tender to touch recently.  He denies any drainage from that region.  He denies any fever or chills.  He has a pain level of 2 out of 10.  It seems to be somewhat enlarging.  He does have a history of prostate cancer, treated with implanted seed radiation.  This was done in the remote past. Past Medical History:  Diagnosis Date  . AAA (abdominal aortic aneurysm) ([place])   . Asthma   . COPD with emphysema ([place])    Dr. Joya Gaskins, Gold stage C  . DJD (degenerative joint disease), lumbar   . Emphysema of lung ([place])   . History of prostate cancer    Status post radioactive seed implants  . History of shingles   . Incomplete emptying of bladder   . Mixed hyperlipidemia   . PAF (paroxysmal atrial fibrillation) ([place])   . Palpitations   . Peripheral neuropathy   . Prostate cancer ([place])   . Sciatic nerve pain    Secondary to shingles 2011  . Urethral stricture   . Wears partial dentures    Upper and lower    Past Surgical History:  Procedure Laterality Date  . CATARACT EXTRACTION W/ INTRAOCULAR LENS  IMPLANT, BILATERAL  2012  . COLONOSCOPY  02-14-2003  . CYSTO/  BALLOON DILATION AND INCISION BLADDER NECK CONTRACTURE  X5  LAST ONE 01-24-2009  . CYSTOSCOPY WITH URETHRAL DILATATION  09/15/2011   Procedure: CYSTOSCOPY WITH URETHRAL DILATATION;  Surgeon: Bernestine Amass, MD;  Location: [facility];  Service: Urology;  Laterality: N/A;  CYSTOSCOPY, BALLOON DILATION   . CYSTOSCOPY WITH URETHRAL DILATATION N/A 04/03/2014   Procedure: CYSTOSCOPY WITH dilatation;  Surgeon: Bernestine Amass, MD;  Location: [facility];  Service: Urology;  Laterality: N/A;  . EYE SURGERY    . RADIOACTIVE PROSTATE SEED IMPLANTS/  CYSTO  WITH BALLOON DILATION BLADDER NECK CONTRACTURE  11-26-2006    Family History  Problem Relation Age of Onset  . COPD Mother   . Heart disease Brother        died at 34    Current Outpatient Medications on File Prior to Visit  Medication Sig Dispense Refill  . aspirin EC 81 MG tablet Take 1 tablet (81 mg total) by mouth every other day. 30 tablet 0  . diltiazem (DILT-XR) 120 MG 24 hr capsule TAKE 2 CAPSULES BY MOUTH IN THE MORNING AND 1 IN THE EVENING 270 capsule 1  . Fluticasone-Umeclidin-Vilant (TRELEGY ELLIPTA) 100-62.5-25 MCG/INH AEPB Inhale into the lungs.    Marland Kitchen LYRICA 150 MG capsule TAKE ONE CAPSULE BY MOUTH THREE TIMES DAILY 90 capsule 0  . methimazole (TAPAZOLE) 5 MG tablet Take 1 tablet (5 mg total) by mouth 2 (two) times daily. 60 tablet 2  . nitroGLYCERIN (NITROSTAT) 0.4 MG SL tablet Place 1 tablet (0.4 mg total) under the tongue every 5 (five) minutes as needed for chest pain. 30 tablet 0  . Oxycodone HCl 10 MG TABS Take 10 mg by mouth every 6 (six) hours as needed (pain).     . predniSONE (DELTASONE) 10 MG tablet Take 1 tablet (10 mg total) by mouth daily with breakfast. 30 tablet 5   No current facility-administered  medications on file prior to visit.     Allergies  Allergen Reactions  . Albuterol Other (See Comments)    Caused extreme tachycardia. If used by continuous infusion.  May use the inhaler    Social History   Substance and Sexual Activity  Alcohol Use No  . Alcohol/week: 0.0 standard drinks    Social History   Tobacco Use  Smoking Status Former Smoker  . Packs/day: 2.00  . Years: 40.00  . Pack years: 80.00  . Types: Cigarettes  . Start date: 06/19/1954  . Last attempt to quit: 09/15/1998  . Years since quitting: 19.7  Smokeless Tobacco Never Used  Tobacco Comment   Counseled to remain smoke free    Review of Systems  Constitutional: Positive for malaise/fatigue.  HENT: Negative.   Eyes: Negative.   Respiratory: Positive for shortness of  breath and wheezing. Negative for cough.   Cardiovascular: Negative.   Gastrointestinal: Negative.   Genitourinary: Negative.   Musculoskeletal: Positive for back pain.  Skin: Negative.   Neurological: Negative.   Endo/Heme/Allergies: Negative.   Psychiatric/Behavioral: Negative.     Objective   Vitals:   06/03/18 0925  BP: 125/77  Pulse: 73  Resp: (!) 22  Temp: (!) 97.5 F (36.4 C)    Physical Exam  Constitutional: He is oriented to person, place, and time. He appears well-developed and well-nourished. No distress.  HENT:  Head: Normocephalic and atraumatic.  Cardiovascular: Normal rate, regular rhythm and normal heart sounds. Exam reveals no gallop and no friction rub.  No murmur heard. Pulmonary/Chest: Effort normal and breath sounds normal. No stridor. No respiratory distress. He has no wheezes. He has no rales.  Genitourinary:  Genitourinary Comments: Small cyst with tenderness at the 11 o'clock position just outside the anal verge.  No fluctuance is present.  Rectal examination unremarkable.  Neurological: He is alert and oriented to person, place, and time.  Skin: Skin is warm and dry.  Vitals reviewed.   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  The risks and benefits of the procedure including bleeding, infection, recurrence of the cyst were fully explained to the patient, who gave informed consent.

## 2018-06-10 ENCOUNTER — Encounter (HOSPITAL_COMMUNITY): Payer: Self-pay

## 2018-06-10 ENCOUNTER — Encounter (HOSPITAL_COMMUNITY)
Admission: RE | Admit: 2018-06-10 | Discharge: 2018-06-10 | Disposition: A | Discharge status: Home / Self Care | Payer: Medicare Other | Source: Ambulatory Visit | Attending: General Surgery | Admitting: General Surgery

## 2018-06-10 NOTE — Progress Notes (Deleted)
Cardiology Office Note  Date: 06/10/2018   ID: Uthman, Mroczkowski 1941-07-08, MRN [id number]  PCP: [name], MD  Primary Cardiologist: Rozann Lesches, MD   No chief complaint on file.   History of Present Illness: [name] [name] is a 77 y.o. male last seen in August.  He has a history of paroxysmal atrial fibrillation.  CHADSVASC score is 3.He has not wanted to pursue anticoagulation.  He did not tolerate Eliquis due to significant nosebleeds.  He is also somewhat hesitant to take aspirin due to easy bleeding on his forearms.  Follow-up Lexiscan Myoview in August was low risk as outlined below.  Past Medical History:  Diagnosis Date  . AAA (abdominal aortic aneurysm) ([place])   . Asthma   . COPD with emphysema ([place])    Dr. Joya Gaskins, Gold stage C  . DJD (degenerative joint disease), lumbar   . Emphysema of lung ([place])   . History of prostate cancer    Status post radioactive seed implants  . History of shingles   . Incomplete emptying of bladder   . Mixed hyperlipidemia   . PAF (paroxysmal atrial fibrillation) ([place])   . Palpitations   . Peripheral neuropathy   . Prostate cancer ([place])   . Sciatic nerve pain    Secondary to shingles 2011  . Urethral stricture   . Wears partial dentures    Upper and lower    Past Surgical History:  Procedure Laterality Date  . CATARACT EXTRACTION W/ INTRAOCULAR LENS  IMPLANT, BILATERAL  2012  . COLONOSCOPY  02-14-2003  . CYSTO/  BALLOON DILATION AND INCISION BLADDER NECK CONTRACTURE  X5  LAST ONE 01-24-2009  . CYSTOSCOPY WITH URETHRAL DILATATION  09/15/2011   Procedure: CYSTOSCOPY WITH URETHRAL DILATATION;  Surgeon: Bernestine Amass, MD;  [facility];  Service: Urology;  Laterality: N/A;  CYSTOSCOPY, BALLOON DILATION   . CYSTOSCOPY WITH URETHRAL DILATATION N/A 04/03/2014   Procedure: CYSTOSCOPY WITH dilatation;  Surgeon: Bernestine Amass, MD;  Location: [facility];  Service: Urology;  Laterality:  N/A;  . EYE SURGERY    . RADIOACTIVE PROSTATE SEED IMPLANTS/  CYSTO WITH BALLOON DILATION BLADDER NECK CONTRACTURE  11-26-2006    Current Outpatient Medications  Medication Sig Dispense Refill  . albuterol (PROVENTIL HFA;VENTOLIN HFA) 108 (90 Base) MCG/ACT inhaler Inhale 1 puff into the lungs every 6 (six) hours as needed for wheezing or shortness of breath.    Marland Kitchen aspirin EC 81 MG tablet Take 1 tablet (81 mg total) by mouth every other day. (Patient taking differently: Take 81 mg by mouth every Monday, Wednesday, and Friday. ) 30 tablet 0  . diltiazem (DILT-XR) 120 MG 24 hr capsule TAKE 2 CAPSULES BY MOUTH IN THE MORNING AND 1 IN THE EVENING (Patient taking differently: Take 120-240 mg by mouth See admin instructions. TAKE 2 CAPSULES BY MOUTH IN THE MORNING AND 1 IN THE EVENING) 270 capsule 1  . Fluticasone-Umeclidin-Vilant (TRELEGY ELLIPTA) 100-62.5-25 MCG/INH AEPB Inhale 1 puff into the lungs daily.     Marland Kitchen LYRICA 150 MG capsule TAKE ONE CAPSULE BY MOUTH THREE TIMES DAILY (Patient taking differently: Take 150 mg by mouth every 8 (eight) hours. ) 90 capsule 0  . nitroGLYCERIN (NITROSTAT) 0.4 MG SL tablet Place 1 tablet (0.4 mg total) under the tongue every 5 (five) minutes as needed for chest pain. 30 tablet 0  . nystatin cream (MYCOSTATIN) Apply 1 application topically 2 (two) times daily.  1  .  Oxycodone HCl 10 MG TABS Take 10 mg by mouth every 6 (six) hours as needed (pain).     . predniSONE (DELTASONE) 10 MG tablet Take 1 tablet (10 mg total) by mouth daily with breakfast. 30 tablet 5   No current facility-administered medications for this visit.    Allergies:  Albuterol   Social History: The patient  reports that he quit smoking about 19 years ago. His smoking use included cigarettes. He started smoking about 64 years ago. He has a 80.00 pack-year smoking history. He has never used smokeless tobacco. He reports that he does not drink alcohol or use drugs.   Family History: The patient's  family history includes COPD in his mother; Heart disease in his brother.   ROS:  Please see the history of present illness. Otherwise, complete review of systems is positive for {NONE DEFAULTED:18576::"none"}.  All other systems are reviewed and negative.   Physical Exam: VS:  There were no vitals taken for this visit., BMI There is no height or weight on file to calculate BMI.  Wt Readings from Last 3 Encounters:  06/03/18 167 lb (75.8 kg)  05/05/18 162 lb 9.6 oz (73.8 kg)  04/22/18 160 lb (72.6 kg)    General: Patient appears comfortable at rest. HEENT: Conjunctiva and lids normal, oropharynx clear with moist mucosa. Neck: Supple, no elevated JVP or carotid bruits, no thyromegaly. Lungs: Clear to auscultation, nonlabored breathing at rest. Cardiac: Regular rate and rhythm, no S3 or significant systolic murmur, no pericardial rub. Abdomen: Soft, nontender, no hepatomegaly, bowel sounds present, no guarding or rebound. Extremities: No pitting edema, distal pulses 2+. Skin: Warm and dry. Musculoskeletal: No kyphosis. Neuropsychiatric: Alert and oriented x3, affect grossly appropriate.  ECG: I personally reviewed the tracing from 04/22/2018 which showed sinus rhythm with anteroseptal Q waves.  Recent Labwork: 11/25/2017: B Natriuretic Peptide 59.0 12/09/2017: Magnesium 2.2 12/15/2017: ALT 29; AST 31 12/16/2017: TSH 0.177 04/22/2018: BUN 19; Creatinine, Ser 1.08; Hemoglobin 16.7; Platelets 206; Potassium 3.6; Sodium 139     Component Value Date/Time   CHOL 154 10/27/2017 1435   TRIG 64 10/27/2017 1435   HDL 62 10/27/2017 1435   CHOLHDL 2.5 10/27/2017 1435   Bell 78 10/27/2017 1435    Other Studies Reviewed Today:  Echocardiogram 11/26/2017: Study Conclusions  - Left ventricle: The cavity size was normal. Wall thickness was at the upper limits of normal. Systolic function was vigorous. The estimated ejection fraction was in the range of 70% to 75% with mild intracavitary  gradient. Wall motion was normal; there were no regional wall motion abnormalities. Doppler parameters are consistent with abnormal left ventricular relaxation (grade 1 diastolic dysfunction). - Aortic valve: Mildly calcified annulus. Trileaflet. - Mitral valve: There was trivial regurgitation. - Right atrium: Central venous pressure (est): 3 mm Hg. - Atrial septum: No defect or patent foramen ovale was identified. - Tricuspid valve: There was trivial regurgitation. - Pulmonary arteries: Systolic pressure could not be accurately estimated. - Pericardium, extracardiac: A prominent pericardial fat pad was present versus organized anterior pericardial effusion.  Lexiscan Myoview 05/12/2018:  No diagnostic ST segment changes to indicate ischemia.  Medium sized, mild intensity, fixed inferior defect that is more prominent at rest and consistent with soft tissue attenuation in light of normal wall motion. No definite ischemia.  This is a low risk study.  Nuclear stress EF: 80%.  Assessment and Plan:   Current medicines were reviewed with the patient today.  No orders of the defined types were placed  in this encounter.   Disposition:  Signed, Satira Sark, MD, [facility] 06/10/2018 3:59 PM    [facility] [address], [place], [place] [address] Phone: [phone]; Fax: [phone]

## 2018-06-11 ENCOUNTER — Ambulatory Visit: Payer: Medicare Other | Admitting: Cardiology

## 2018-06-16 ENCOUNTER — Ambulatory Visit (HOSPITAL_COMMUNITY): Payer: Medicare Other | Admitting: Anesthesiology

## 2018-06-16 ENCOUNTER — Encounter (HOSPITAL_COMMUNITY): Payer: Self-pay | Admitting: Anesthesiology

## 2018-06-16 ENCOUNTER — Other Ambulatory Visit: Payer: Self-pay

## 2018-06-16 ENCOUNTER — Ambulatory Visit (HOSPITAL_COMMUNITY)
Admission: RE | Admit: 2018-06-16 | Discharge: 2018-06-16 | Disposition: A | Discharge status: Home / Self Care | Payer: Medicare Other | Source: Ambulatory Visit | Attending: General Surgery | Admitting: General Surgery

## 2018-06-16 ENCOUNTER — Encounter (HOSPITAL_COMMUNITY)
Admission: RE | Disposition: A | Discharge status: Home / Self Care | Payer: Self-pay | Source: Ambulatory Visit | Attending: General Surgery

## 2018-06-16 DIAGNOSIS — Z79899 Other long term (current) drug therapy: Secondary | ICD-10-CM | POA: Insufficient documentation

## 2018-06-16 DIAGNOSIS — Z7952 Long term (current) use of systemic steroids: Secondary | ICD-10-CM | POA: Diagnosis not present

## 2018-06-16 DIAGNOSIS — I480 Paroxysmal atrial fibrillation: Secondary | ICD-10-CM | POA: Diagnosis not present

## 2018-06-16 DIAGNOSIS — J439 Emphysema, unspecified: Secondary | ICD-10-CM | POA: Diagnosis not present

## 2018-06-16 DIAGNOSIS — G629 Polyneuropathy, unspecified: Secondary | ICD-10-CM | POA: Diagnosis not present

## 2018-06-16 DIAGNOSIS — Z79891 Long term (current) use of opiate analgesic: Secondary | ICD-10-CM | POA: Insufficient documentation

## 2018-06-16 DIAGNOSIS — Z8546 Personal history of malignant neoplasm of prostate: Secondary | ICD-10-CM | POA: Insufficient documentation

## 2018-06-16 DIAGNOSIS — L723 Sebaceous cyst: Secondary | ICD-10-CM | POA: Insufficient documentation

## 2018-06-16 DIAGNOSIS — Z7982 Long term (current) use of aspirin: Secondary | ICD-10-CM | POA: Insufficient documentation

## 2018-06-16 DIAGNOSIS — K6289 Other specified diseases of anus and rectum: Secondary | ICD-10-CM | POA: Diagnosis present

## 2018-06-16 DIAGNOSIS — Z7951 Long term (current) use of inhaled steroids: Secondary | ICD-10-CM | POA: Insufficient documentation

## 2018-06-16 DIAGNOSIS — F17210 Nicotine dependence, cigarettes, uncomplicated: Secondary | ICD-10-CM | POA: Insufficient documentation

## 2018-06-16 DIAGNOSIS — I10 Essential (primary) hypertension: Secondary | ICD-10-CM | POA: Insufficient documentation

## 2018-06-16 HISTORY — PX: MASS EXCISION: SHX2000

## 2018-06-16 SURGERY — EXCISION MASS
Anesthesia: General

## 2018-06-16 MED ORDER — CHLORHEXIDINE GLUCONATE CLOTH 2 % EX PADS: 6.0000 | MEDICATED_PAD | Freq: Once | CUTANEOUS | Status: DC

## 2018-06-16 MED ORDER — BUPIVACAINE HCL (PF) 0.5 % IJ SOLN
INTRAMUSCULAR | Status: DC | PRN
  Administered 2018-06-16: 2 mL

## 2018-06-16 MED ORDER — LACTATED RINGERS IV SOLN: INTRAVENOUS | Status: DC

## 2018-06-16 MED ORDER — EPHEDRINE SULFATE 50 MG/ML IJ SOLN
INTRAMUSCULAR | Status: AC
  Filled 2018-06-16: qty 1

## 2018-06-16 MED ORDER — SODIUM CHLORIDE 0.9 % IV SOLN
2.0000 g | INTRAVENOUS | Status: AC
  Administered 2018-06-16: 2 g via INTRAVENOUS
  Filled 2018-06-16: qty 2

## 2018-06-16 MED ORDER — HYDROMORPHONE HCL 1 MG/ML IJ SOLN: 0.2500 mg | INTRAMUSCULAR | Status: DC | PRN

## 2018-06-16 MED ORDER — LACTATED RINGERS IV SOLN
INTRAVENOUS | Status: DC
  Administered 2018-06-16: 08:00:00 via INTRAVENOUS

## 2018-06-16 MED ORDER — PROPOFOL 10 MG/ML IV BOLUS
INTRAVENOUS | Status: AC
  Filled 2018-06-16: qty 20

## 2018-06-16 MED ORDER — GLYCOPYRROLATE 0.2 MG/ML IJ SOLN
INTRAMUSCULAR | Status: AC
  Filled 2018-06-16: qty 4

## 2018-06-16 MED ORDER — KETOROLAC TROMETHAMINE 30 MG/ML IJ SOLN
INTRAMUSCULAR | Status: AC
  Filled 2018-06-16: qty 1

## 2018-06-16 MED ORDER — 0.9 % SODIUM CHLORIDE (POUR BTL) OPTIME
TOPICAL | Status: DC | PRN
  Administered 2018-06-16: 1000 mL

## 2018-06-16 MED ORDER — PROPOFOL 10 MG/ML IV BOLUS
INTRAVENOUS | Status: DC | PRN
  Administered 2018-06-16: 140 mg via INTRAVENOUS

## 2018-06-16 MED ORDER — MEPERIDINE HCL 50 MG/ML IJ SOLN: 6.2500 mg | INTRAMUSCULAR | Status: DC | PRN

## 2018-06-16 MED ORDER — SODIUM CHLORIDE 0.9 % IJ SOLN
INTRAMUSCULAR | Status: AC
  Filled 2018-06-16: qty 10

## 2018-06-16 MED ORDER — PROMETHAZINE HCL 25 MG/ML IJ SOLN: 6.2500 mg | INTRAMUSCULAR | Status: DC | PRN

## 2018-06-16 MED ORDER — ROCURONIUM BROMIDE 50 MG/5ML IV SOLN
INTRAVENOUS | Status: AC
  Filled 2018-06-16: qty 1

## 2018-06-16 MED ORDER — KETOROLAC TROMETHAMINE 30 MG/ML IJ SOLN
30.0000 mg | Freq: Once | INTRAMUSCULAR | Status: AC
  Administered 2018-06-16: 30 mg via INTRAVENOUS
  Filled 2018-06-16: qty 1

## 2018-06-16 MED ORDER — HYDROCODONE-ACETAMINOPHEN 7.5-325 MG PO TABS: 1.0000 | ORAL_TABLET | Freq: Once | ORAL | Status: DC | PRN

## 2018-06-16 MED ORDER — FENTANYL CITRATE (PF) 250 MCG/5ML IJ SOLN
INTRAMUSCULAR | Status: AC
  Filled 2018-06-16: qty 5

## 2018-06-16 MED ORDER — FENTANYL CITRATE (PF) 100 MCG/2ML IJ SOLN
INTRAMUSCULAR | Status: DC | PRN
  Administered 2018-06-16: 50 ug via INTRAVENOUS

## 2018-06-16 MED ORDER — LIDOCAINE HCL (CARDIAC) PF 50 MG/5ML IV SOSY
PREFILLED_SYRINGE | INTRAVENOUS | Status: DC | PRN
  Administered 2018-06-16: 40 mg via INTRAVENOUS

## 2018-06-16 MED ORDER — EPHEDRINE SULFATE 50 MG/ML IJ SOLN
INTRAMUSCULAR | Status: DC | PRN
  Administered 2018-06-16 (×2): 5 mg via INTRAVENOUS

## 2018-06-16 MED ORDER — BUPIVACAINE HCL (PF) 0.5 % IJ SOLN
INTRAMUSCULAR | Status: AC
  Filled 2018-06-16: qty 30

## 2018-06-16 MED ORDER — ONDANSETRON HCL 4 MG/2ML IJ SOLN
INTRAMUSCULAR | Status: AC
  Filled 2018-06-16: qty 2

## 2018-06-16 SURGICAL SUPPLY — 37 items
ADH SKN CLS APL DERMABOND .7 (GAUZE/BANDAGES/DRESSINGS) ×1
BLADE SURG SZ11 CARB STEEL (BLADE) IMPLANT
CHLORAPREP W/TINT 10.5 ML (MISCELLANEOUS) ×3 IMPLANT
CLOTH BEACON ORANGE TIMEOUT ST (SAFETY) ×3 IMPLANT
COVER LIGHT HANDLE STERIS (MISCELLANEOUS) ×6 IMPLANT
DECANTER SPIKE VIAL GLASS SM (MISCELLANEOUS) ×3 IMPLANT
DERMABOND ADVANCED (GAUZE/BANDAGES/DRESSINGS) ×2
DERMABOND ADVANCED .7 DNX12 (GAUZE/BANDAGES/DRESSINGS) IMPLANT
DRAPE EENT ADH APERT 31X51 STR (DRAPES) IMPLANT
ELECT NDL TIP 2.8 STRL (NEEDLE) IMPLANT
ELECT NEEDLE TIP 2.8 STRL (NEEDLE) IMPLANT
ELECT REM PT RETURN 9FT ADLT (ELECTROSURGICAL) ×3
ELECTRODE REM PT RTRN 9FT ADLT (ELECTROSURGICAL) ×1 IMPLANT
GAUZE SPONGE 4X4 12PLY STRL (GAUZE/BANDAGES/DRESSINGS) ×3 IMPLANT
GLOVE BIOGEL M 7.0 STRL (GLOVE) ×2 IMPLANT
GLOVE BIOGEL PI IND STRL 7.0 (GLOVE) ×1 IMPLANT
GLOVE BIOGEL PI IND STRL 7.5 (GLOVE) IMPLANT
GLOVE BIOGEL PI INDICATOR 7.0 (GLOVE) ×4
GLOVE BIOGEL PI INDICATOR 7.5 (GLOVE) ×2
GLOVE SURG SS PI 7.5 STRL IVOR (GLOVE) ×3 IMPLANT
GOWN STRL REUS W/ TWL XL LVL3 (GOWN DISPOSABLE) ×1 IMPLANT
GOWN STRL REUS W/TWL LRG LVL3 (GOWN DISPOSABLE) ×3 IMPLANT
GOWN STRL REUS W/TWL XL LVL3 (GOWN DISPOSABLE) ×3
KIT TURNOVER KIT A (KITS) ×3 IMPLANT
MANIFOLD NEPTUNE II (INSTRUMENTS) ×3 IMPLANT
NDL HYPO 25X1 1.5 SAFETY (NEEDLE) ×1 IMPLANT
NEEDLE HYPO 25X1 1.5 SAFETY (NEEDLE) ×3 IMPLANT
NS IRRIG 1000ML POUR BTL (IV SOLUTION) ×3 IMPLANT
PACK MINOR (CUSTOM PROCEDURE TRAY) ×3 IMPLANT
PAD ARMBOARD 7.5X6 YLW CONV (MISCELLANEOUS) ×3 IMPLANT
SET BASIN LINEN APH (SET/KITS/TRAYS/PACK) ×3 IMPLANT
SUT ETHILON 3 0 FSL (SUTURE) IMPLANT
SUT MNCRL AB 4-0 PS2 18 (SUTURE) IMPLANT
SUT PROLENE 4 0 PS 2 18 (SUTURE) IMPLANT
SUT VIC AB 3-0 SH 27 (SUTURE)
SUT VIC AB 3-0 SH 27X BRD (SUTURE) IMPLANT
SYR CONTROL 10ML LL (SYRINGE) ×3 IMPLANT

## 2018-06-16 NOTE — Anesthesia Preprocedure Evaluation (Signed)
Anesthesia Evaluation  Patient identified by MRN, date of birth, ID band Patient awake    Reviewed: Allergy & Precautions, H&P , NPO status , Patient's Chart, lab work & pertinent test results, reviewed documented beta blocker date and time   Airway Mallampati: II  TM Distance: >3 FB Neck ROM: full    Dental no notable dental hx. (+) Partial Upper, Partial Lower, Dental Advidsory Given   Pulmonary neg pulmonary ROS, asthma , COPD,  COPD inhaler, former smoker,    Pulmonary exam normal breath sounds clear to auscultation       Cardiovascular Exercise Tolerance: Good hypertension, negative cardio ROS  Atrial Fibrillation  Rhythm:regular Rate:Normal     Neuro/Psych  Neuromuscular disease negative neurological ROS  negative psych ROS   GI/Hepatic negative GI ROS, Neg liver ROS,   Endo/Other  negative endocrine ROS  Renal/GU negative Renal ROS  negative genitourinary   Musculoskeletal   Abdominal   Peds  Hematology negative hematology ROS (+)   Anesthesia Other Findings COPD, severe with h/o SIRS Afib, paroxysmal  Reproductive/Obstetrics negative OB ROS                             Anesthesia Physical Anesthesia Plan  ASA: IV  Anesthesia Plan: General LMA   Post-op Pain Management:    Induction:   PONV Risk Score and Plan:   Airway Management Planned:   Additional Equipment:   Intra-op Plan:   Post-operative Plan:   Informed Consent: I have reviewed the patients History and Physical, chart, labs and discussed the procedure including the risks, benefits and alternatives for the proposed anesthesia with the patient or authorized representative who has indicated his/her understanding and acceptance.   Dental Advisory Given  Plan Discussed with: CRNA and Anesthesiologist  Anesthesia Plan Comments:         Anesthesia Quick Evaluation

## 2018-06-16 NOTE — Discharge Instructions (Signed)
Epidermal Cyst Removal, Care After Refer to this sheet in the next few weeks. These instructions provide you with information about caring for yourself after your procedure. Your health care provider may also give you more specific instructions. Your treatment has been planned according to current medical practices, but problems sometimes occur. Call your health care provider if you have any problems or questions after your procedure. What can I expect after the procedure? After the procedure, it is common to have:  Soreness in the area where your cyst was removed.  Tightness or itching from your skin sutures.  Follow these instructions at home:  Take medicines only as directed by your health care provider.  If you were prescribed an antibiotic medicine, finish all of it even if you start to feel better.  Use antibiotic ointment as directed by your health care provider. Follow the instructions carefully.  There are many different ways to close and cover an incision, including stitches (sutures), skin glue, and adhesive strips. Follow your health care provider's instructions about: ? Incision care. ? Bandage (dressing) changes and removal. ? Incision closure removal.  Keep the bandage (dressing) dry until your health care provider says that it can be removed. Take sponge baths only. Ask your health care provider when you can start showering or taking a bath.  After your dressing is off, check your incision every day for signs of infection. Watch for: ? Redness, swelling, or pain. ? Fluid, blood, or pus.  You can return to your normal activities. Do not do anything that stretches or puts pressure on your incision.  You can return to your normal diet.  Keep all follow-up visits as directed by your health care provider. This is important. Contact a health care provider if:  You have a fever.  Your incision bleeds.  You have redness, swelling, or pain in the incision area.  You  have fluid, blood, or pus coming from your incision.  Your cyst comes back after surgery. This information is not intended to replace advice given to you by your health care provider. Make sure you discuss any questions you have with your health care provider. Document Released: 09/22/2014 Document Revised: 02/07/2016 Document Reviewed: 05/17/2014 Elsevier Interactive Patient Education  2018 Elsevier Inc.  

## 2018-06-16 NOTE — Anesthesia Procedure Notes (Signed)
Procedure Name: LMA Insertion Date/Time: 06/16/2018 9:02 AM Performed by: Ollen Bowl, CRNA Pre-anesthesia Checklist: Patient identified, Patient being monitored, Emergency Drugs available, Timeout performed and Suction available Patient Re-evaluated:Patient Re-evaluated prior to induction Oxygen Delivery Method: Circle System Utilized Preoxygenation: Pre-oxygenation with 100% oxygen Induction Type: IV induction Ventilation: Mask ventilation without difficulty LMA: LMA inserted LMA Size: 4.0 Number of attempts: 1 Placement Confirmation: positive ETCO2 and breath sounds checked- equal and bilateral

## 2018-06-16 NOTE — Op Note (Signed)
Patient:  [name] [name]  DOB:  1941-03-04  MRN:  [id number]   Preop Diagnosis: Cyst, perianal  Postop Diagnosis: Same  Procedure: Excision of cyst, perianal  Surgeon: Aviva Signs, MD  Anes: General  Indications: Patient is a 77 year old white male who presents with a cystic lesion at the 11 o'clock position of the anus.  The risks and benefits of the procedure including bleeding, infection, and the possibility of a fistula were fully explained to the patient, who gave informed consent.  Procedure note: The patient was placed in the lithotomy position after general anesthesia was administered.  The perineum was prepped and draped using usual sterile technique with Betadine.  Surgical site confirmation was performed.  A 1 cm incision was made over the cystic lesion outside the anal verge of the 11 o'clock position.  A sebaceous cyst was found.  No fistula was present.  The cyst and the wall were removed without difficulty.  They were disposed of.  Wound was irrigated with normal saline.  0.5% Sensorcaine was instilled into the surrounding wound.  The skin was reapproximated using 4-0 Monocryl subcuticular sutures.  Dermabond was applied.  All tape and needle counts were correct at the end of the procedure.  The patient was awakened and transferred to PACU in stable condition.  Complications: None  EBL: Minimal  Specimen: None

## 2018-06-16 NOTE — Transfer of Care (Signed)
Immediate Anesthesia Transfer of Care Note  Patient: [name] [name]  Procedure(s) Performed: EXCISION PERIANAL CYST (N/A )  Patient Location: PACU  Anesthesia Type:General  Level of Consciousness: awake, alert  and oriented  Airway & Oxygen Therapy: Patient Spontanous Breathing  Post-op Assessment: Report given to RN  Post vital signs: Reviewed and stable  Last Vitals:  Vitals Value Taken Time  BP 122/67 06/16/2018  9:35 AM  Temp    Pulse 72 06/16/2018  9:38 AM  Resp 13 06/16/2018  9:38 AM  SpO2 95 % 06/16/2018  9:38 AM  Vitals shown include unvalidated device data.  Last Pain:  Vitals:   06/16/18 0736  TempSrc: Oral  PainSc: 2       Patients Stated Pain Goal: 7 (19/75/88 3254)  Complications: No apparent anesthesia complications

## 2018-06-16 NOTE — Anesthesia Postprocedure Evaluation (Signed)
Anesthesia Post Note  Patient: [name] [name]  Procedure(s) Performed: EXCISION PERIANAL CYST (N/A )  Patient location during evaluation: PACU Anesthesia Type: General Level of consciousness: awake and alert and oriented Pain management: pain level controlled Vital Signs Assessment: post-procedure vital signs reviewed and stable Respiratory status: spontaneous breathing Cardiovascular status: blood pressure returned to baseline and stable Postop Assessment: no apparent nausea or vomiting and adequate PO intake Anesthetic complications: no     Last Vitals:  Vitals:   06/16/18 1000 06/16/18 1017  BP: 122/67 124/63  Pulse: 66 70  Resp: 15   Temp:  36.5 C  SpO2: 93% 92%    Last Pain:  Vitals:   06/16/18 1017  TempSrc: Oral  PainSc: 0-No pain                 Mars Scheaffer

## 2018-06-16 NOTE — Interval H&P Note (Signed)
History and Physical Interval Note:  06/16/2018 8:15 AM  [name] [name]  has presented today for surgery, with the diagnosis of perianal cyst  The various methods of treatment have been discussed with the patient and family. After consideration of risks, benefits and other options for treatment, the patient has consented to  Procedure(s): EXCISION PERIANAL CYST (N/A) as a surgical intervention .  The patient's history has been reviewed, patient examined, no change in status, stable for surgery.  I have reviewed the patient's chart and labs.  Questions were answered to the patient's satisfaction.     Aviva Signs

## 2018-06-17 ENCOUNTER — Encounter (HOSPITAL_COMMUNITY): Payer: Self-pay | Admitting: General Surgery

## 2018-06-18 ENCOUNTER — Other Ambulatory Visit: Payer: Self-pay | Admitting: Cardiology

## 2018-06-22 ENCOUNTER — Ambulatory Visit: Payer: Self-pay | Admitting: General Surgery

## 2018-06-24 ENCOUNTER — Ambulatory Visit (INDEPENDENT_AMBULATORY_CARE_PROVIDER_SITE_OTHER): Payer: Self-pay | Admitting: General Surgery

## 2018-06-24 ENCOUNTER — Encounter: Payer: Self-pay | Admitting: General Surgery

## 2018-06-24 VITALS — BP 149/95 | HR 63 | Temp 97.8°F | Resp 20 | Wt 184.0 lb

## 2018-06-24 DIAGNOSIS — Z09 Encounter for follow-up examination after completed treatment for conditions other than malignant neoplasm: Secondary | ICD-10-CM

## 2018-06-24 NOTE — Progress Notes (Signed)
Subjective:     [name] [name]  Status post excision of perineal cyst.  Doing well.  Has no complaints. Objective:    BP (!) 149/95 (BP Location: Left Arm, Patient Position: Sitting, Cuff Size: Normal)   Pulse 63   Temp 97.8 F (36.6 C) (Temporal)   Resp 20   Wt 184 lb (83.5 kg)   BMI 26.40 kg/m   General:  alert, cooperative and no distress  Incision healing well.  No purulent drainage present.     Assessment:    Doing well postoperatively.    Plan:   Follow-up here as needed.

## 2018-07-19 ENCOUNTER — Telehealth: Payer: Self-pay | Admitting: Emergency Medicine

## 2018-07-19 NOTE — Telephone Encounter (Signed)
Called patient unable to reach left message to give us a call back.

## 2018-07-19 NOTE — Telephone Encounter (Signed)
Patient returned phone call. °

## 2018-07-20 NOTE — Telephone Encounter (Signed)
Attempted to contact pt. I did not receive an answer. I have left a message for pt to return our call.  

## 2018-07-21 NOTE — Progress Notes (Signed)
Cardiology Office Note  Date: 07/22/2018   ID: [name] [name], DOB May 24, 1941, MRN [id number]  PCP: [name], MD  Primary Cardiologist: Rozann Lesches, MD   Chief Complaint  Patient presents with  . Atrial Fibrillation    History of Present Illness: DERRAL COLUCCI is a 77 y.o. male last seen in August.  He is here for a routine visit.  Continues to experience chronic shortness of breath in the setting of lung disease, using oxygen at nighttime now but is in the process of increasing this use for exertion during the daytime.  He has had no progressive episodes of angina and has not required nitroglycerin.  He has a history of paroxysmal atrial fibrillation.  CHADSVASC score is 3.He has not wanted to pursue anticoagulation.  He did not tolerate Eliquis due to significant nosebleeds.  He is taking an aspirin.  Also continues on diltiazem CD.  Follow-up Lexiscan Myoview in August was overall low risk showing a region of soft tissue attenuation but no definite ischemia, LVEF vigorous.  Past Medical History:  Diagnosis Date  . AAA (abdominal aortic aneurysm) ([place])   . Asthma   . COPD with emphysema ([place])    Dr. Joya Gaskins, Gold stage C  . DJD (degenerative joint disease), lumbar   . Emphysema of lung ([place])   . History of prostate cancer    Status post radioactive seed implants  . History of shingles   . Incomplete emptying of bladder   . Mixed hyperlipidemia   . PAF (paroxysmal atrial fibrillation) ([place])   . Palpitations   . Peripheral neuropathy   . Prostate cancer ([place])   . Sciatic nerve pain    Secondary to shingles 2011  . Urethral stricture   . Wears partial dentures    Upper and lower    Past Surgical History:  Procedure Laterality Date  . CATARACT EXTRACTION W/ INTRAOCULAR LENS  IMPLANT, BILATERAL  2012  . COLONOSCOPY  02-14-2003  . CYSTO/  BALLOON DILATION AND INCISION BLADDER NECK CONTRACTURE  X5  LAST ONE 01-24-2009  . CYSTOSCOPY WITH URETHRAL DILATATION   09/15/2011   Procedure: CYSTOSCOPY WITH URETHRAL DILATATION;  Surgeon: Bernestine Amass, MD;  Location[facility];  Service: Urology;  Laterality: N/A;  CYSTOSCOPY, BALLOON DILATION   . CYSTOSCOPY WITH URETHRAL DILATATION N/A 04/03/2014   Procedure: CYSTOSCOPY WITH dilatation;  Surgeon: Bernestine Amass, MD;  Location: [facility];  Service: Urology;  Laterality: N/A;  . EYE SURGERY    . MASS EXCISION N/A 06/16/2018   Procedure: EXCISION PERIANAL CYST;  Surgeon: Aviva Signs, MD;  Location: [facility];  Service: General;  Laterality: N/A;  . RADIOACTIVE PROSTATE SEED IMPLANTS/  CYSTO WITH BALLOON DILATION BLADDER NECK CONTRACTURE  11-26-2006    Current Outpatient Medications  Medication Sig Dispense Refill  . albuterol (PROVENTIL HFA;VENTOLIN HFA) 108 (90 Base) MCG/ACT inhaler Inhale 1 puff into the lungs every 6 (six) hours as needed for wheezing or shortness of breath.    Marland Kitchen aspirin EC 81 MG tablet Take 1 tablet (81 mg total) by mouth every other day. (Patient taking differently: Take 81 mg by mouth every Monday, Wednesday, and Friday. ) 30 tablet 0  . diltiazem (DILT-XR) 120 MG 24 hr capsule TAKE 2 CAPSULES BY MOUTH IN THE MORNING AND 1 IN THE EVENING 270 capsule 3  . Fluticasone-Umeclidin-Vilant (TRELEGY ELLIPTA) 100-62.5-25 MCG/INH AEPB Inhale 1 puff into the lungs daily.     Marland Kitchen LYRICA 150  MG capsule TAKE ONE CAPSULE BY MOUTH THREE TIMES DAILY (Patient taking differently: Take 150 mg by mouth every 8 (eight) hours. ) 90 capsule 0  . nitroGLYCERIN (NITROSTAT) 0.4 MG SL tablet Place 1 tablet (0.4 mg total) under the tongue every 5 (five) minutes as needed for chest pain. 30 tablet 0  . predniSONE (DELTASONE) 10 MG tablet Take 1 tablet (10 mg total) by mouth daily with breakfast. 30 tablet 5   No current facility-administered medications for this visit.    Allergies:  Albuterol   Social History: The patient  reports that he quit smoking about 19 years ago. His smoking  use included cigarettes. He started smoking about 64 years ago. He has a 80.00 pack-year smoking history. He has never used smokeless tobacco. He reports that he does not drink alcohol or use drugs.   ROS:  Please see the history of present illness. Otherwise, complete review of systems is positive for fatigue, chronic shortness of breath.  All other systems are reviewed and negative.   Physical Exam: VS:  BP 128/78   Pulse 89   Ht 5\' 10"  (1.778 m)   Wt 167 lb (75.8 kg)   SpO2 93%   BMI 23.96 kg/m , BMI Body mass index is 23.96 kg/m.  Wt Readings from Last 3 Encounters:  07/22/18 167 lb (75.8 kg)  06/24/18 184 lb (83.5 kg)  06/03/18 167 lb (75.8 kg)    General: Patient appears comfortable at rest. HEENT: Conjunctiva and lids normal, oropharynx clear. Neck: Supple, no elevated JVP or carotid bruits, no thyromegaly. Lungs: Diminished breath sounds throughout without wheezing, nonlabored breathing at rest. Cardiac: Regular rate and rhythm, no S3 or significant systolic murmur. Abdomen: Soft, nontender, bowel sounds present. Extremities: No pitting edema, distal pulses 2+. Skin: Warm and dry. Musculoskeletal: No kyphosis. Neuropsychiatric: Alert and oriented x3, affect grossly appropriate.  ECG: I personally reviewed the tracing from 04/22/2018 which showed sinus rhythm with small R' in lead V1.  Recent Labwork: 11/25/2017: B Natriuretic Peptide 59.0 12/09/2017: Magnesium 2.2 12/15/2017: ALT 29; AST 31 12/16/2017: TSH 0.177 04/22/2018: BUN 19; Creatinine, Ser 1.08; Hemoglobin 16.7; Platelets 206; Potassium 3.6; Sodium 139     Component Value Date/Time   CHOL 154 10/27/2017 1435   TRIG 64 10/27/2017 1435   HDL 62 10/27/2017 1435   CHOLHDL 2.5 10/27/2017 1435   LDLCALC 78 10/27/2017 1435    Other Studies Reviewed Today:  Lexiscan Myoview 05/12/2018:  No diagnostic ST segment changes to indicate ischemia.  Medium sized, mild intensity, fixed inferior defect that is more prominent  at rest and consistent with soft tissue attenuation in light of normal wall motion. No definite ischemia.  This is a low risk study.  Nuclear stress EF: 80%.  Assessment and Plan:  1.  Paroxysmal atrial fibrillation.  He continues on Cardizem CD as well as aspirin, declines anticoagulation. CHADSVASC score is 3.  2.  COPD with chronic hypoxic respiratory failure.  He uses oxygen at nighttime.  Continues to follow with Pulmonary.  3.  History of anginal chest pain although with reassuring Myoview in August.  Plan to continue observation on aspirin, as needed use of nitroglycerin.  Current medicines were reviewed with the patient today.  Disposition: Follow-up in 6 months.  Signed, Satira Sark, MD, [facility] 07/22/2018 1:10 PM    [place] at [facility] [address], [place], [place] [address] Phone: [phone]; Fax: [phone]

## 2018-07-21 NOTE — Telephone Encounter (Signed)
Unable to reach patient left message to give us a call back. 

## 2018-07-22 ENCOUNTER — Encounter: Payer: Self-pay | Admitting: Cardiology

## 2018-07-22 ENCOUNTER — Ambulatory Visit (INDEPENDENT_AMBULATORY_CARE_PROVIDER_SITE_OTHER): Payer: Medicare Other | Admitting: Cardiology

## 2018-07-22 VITALS — BP 128/78 | HR 89 | Ht 70.0 in | Wt 167.0 lb

## 2018-07-22 DIAGNOSIS — J449 Chronic obstructive pulmonary disease, unspecified: Secondary | ICD-10-CM | POA: Diagnosis not present

## 2018-07-22 DIAGNOSIS — I209 Angina pectoris, unspecified: Secondary | ICD-10-CM | POA: Diagnosis not present

## 2018-07-22 DIAGNOSIS — I480 Paroxysmal atrial fibrillation: Secondary | ICD-10-CM | POA: Diagnosis not present

## 2018-07-22 MED ORDER — DILTIAZEM HCL ER 120 MG PO CP24: ORAL_CAPSULE | ORAL | 3 refills | Status: DC

## 2018-07-22 NOTE — Patient Instructions (Signed)
Medication Instructions:  No changes If you need a refill on your cardiac medications before your next appointment, please call your pharmacy.   Lab work: NONE If you have labs (blood work) drawn today and your tests are completely normal, you will receive your results only by: Marland Kitchen MyChart Message (if you have MyChart) OR . A paper copy in the mail If you have any lab test that is abnormal or we need to change your treatment, we will call you to review the results.  Testing/Procedures: NINE  Follow-Up: At [facility], you and your health needs are our priority.  As part of our continuing mission to provide you with exceptional heart care, we have created designated Provider Care Teams.  These Care Teams include your primary Cardiologist (physician) and Advanced Practice Providers (APPs -  Physician Assistants and Nurse Practitioners) who all work together to provide you with the care you need, when you need it. You will need a follow up appointment in 6 months.  Please call our office 2 months in advance to schedule this appointment.  You may see Rozann Lesches, MD or one of the following Advanced Practice Providers on your designated Care Team:   6 mons  smBrittany Strader, PA-C ([place]) . Ermalinda Barrios, PA-C ([place])  Any Other Special Instructions Will Be Listed Below (If Applicable). NONE

## 2018-07-22 NOTE — Telephone Encounter (Signed)
Per triage protocol, attempted to call pt X 3 but no response. Will close encounter.

## 2018-08-11 ENCOUNTER — Other Ambulatory Visit: Payer: Self-pay

## 2018-08-11 ENCOUNTER — Emergency Department (HOSPITAL_COMMUNITY): Payer: Medicare Other

## 2018-08-11 ENCOUNTER — Encounter (HOSPITAL_COMMUNITY): Payer: Self-pay

## 2018-08-11 ENCOUNTER — Inpatient Hospital Stay (HOSPITAL_COMMUNITY)
Admission: EM | Admit: 2018-08-11 | Discharge: 2018-08-14 | DRG: 190 | Disposition: A | Discharge status: Home / Self Care | Payer: Medicare Other | Attending: Internal Medicine | Admitting: Internal Medicine

## 2018-08-11 DIAGNOSIS — Z9981 Dependence on supplemental oxygen: Secondary | ICD-10-CM | POA: Diagnosis not present

## 2018-08-11 DIAGNOSIS — J439 Emphysema, unspecified: Principal | ICD-10-CM | POA: Diagnosis present

## 2018-08-11 DIAGNOSIS — G629 Polyneuropathy, unspecified: Secondary | ICD-10-CM | POA: Diagnosis present

## 2018-08-11 DIAGNOSIS — Z79899 Other long term (current) drug therapy: Secondary | ICD-10-CM | POA: Diagnosis not present

## 2018-08-11 DIAGNOSIS — Z7952 Long term (current) use of systemic steroids: Secondary | ICD-10-CM | POA: Diagnosis not present

## 2018-08-11 DIAGNOSIS — I951 Orthostatic hypotension: Secondary | ICD-10-CM | POA: Diagnosis present

## 2018-08-11 DIAGNOSIS — Z8546 Personal history of malignant neoplasm of prostate: Secondary | ICD-10-CM | POA: Diagnosis not present

## 2018-08-11 DIAGNOSIS — D72829 Elevated white blood cell count, unspecified: Secondary | ICD-10-CM | POA: Diagnosis present

## 2018-08-11 DIAGNOSIS — E43 Unspecified severe protein-calorie malnutrition: Secondary | ICD-10-CM | POA: Diagnosis present

## 2018-08-11 DIAGNOSIS — T380X5A Adverse effect of glucocorticoids and synthetic analogues, initial encounter: Secondary | ICD-10-CM | POA: Diagnosis present

## 2018-08-11 DIAGNOSIS — I714 Abdominal aortic aneurysm, without rupture, unspecified: Secondary | ICD-10-CM | POA: Diagnosis present

## 2018-08-11 DIAGNOSIS — I480 Paroxysmal atrial fibrillation: Secondary | ICD-10-CM | POA: Diagnosis present

## 2018-08-11 DIAGNOSIS — Z7951 Long term (current) use of inhaled steroids: Secondary | ICD-10-CM | POA: Diagnosis not present

## 2018-08-11 DIAGNOSIS — Z7982 Long term (current) use of aspirin: Secondary | ICD-10-CM

## 2018-08-11 DIAGNOSIS — Z87891 Personal history of nicotine dependence: Secondary | ICD-10-CM | POA: Diagnosis not present

## 2018-08-11 DIAGNOSIS — R509 Fever, unspecified: Secondary | ICD-10-CM

## 2018-08-11 DIAGNOSIS — I4891 Unspecified atrial fibrillation: Secondary | ICD-10-CM | POA: Diagnosis present

## 2018-08-11 DIAGNOSIS — J9601 Acute respiratory failure with hypoxia: Secondary | ICD-10-CM | POA: Diagnosis present

## 2018-08-11 DIAGNOSIS — Z6823 Body mass index (BMI) 23.0-23.9, adult: Secondary | ICD-10-CM | POA: Diagnosis not present

## 2018-08-11 DIAGNOSIS — J441 Chronic obstructive pulmonary disease with (acute) exacerbation: Secondary | ICD-10-CM | POA: Diagnosis not present

## 2018-08-11 DIAGNOSIS — E782 Mixed hyperlipidemia: Secondary | ICD-10-CM | POA: Diagnosis present

## 2018-08-11 DIAGNOSIS — Z972 Presence of dental prosthetic device (complete) (partial): Secondary | ICD-10-CM

## 2018-08-11 LAB — CBC WITH DIFFERENTIAL/PLATELET
Abs Immature Granulocytes: 0.09 10*3/uL — ABNORMAL HIGH (ref 0.00–0.07)
Basophils Absolute: 0.1 10*3/uL (ref 0.0–0.1)
Basophils Relative: 0 %
EOS ABS: 0 10*3/uL (ref 0.0–0.5)
EOS PCT: 0 %
HCT: 50 % (ref 39.0–52.0)
Hemoglobin: 16.9 g/dL (ref 13.0–17.0)
Immature Granulocytes: 0 %
LYMPHS ABS: 1.9 10*3/uL (ref 0.7–4.0)
Lymphocytes Relative: 9 %
MCH: 30.3 pg (ref 26.0–34.0)
MCHC: 33.8 g/dL (ref 30.0–36.0)
MCV: 89.6 fL (ref 80.0–100.0)
MONO ABS: 2.8 10*3/uL — AB (ref 0.1–1.0)
MONOS PCT: 13 %
Neutro Abs: 17 10*3/uL — ABNORMAL HIGH (ref 1.7–7.7)
Neutrophils Relative %: 78 %
Platelets: 250 10*3/uL (ref 150–400)
RBC: 5.58 MIL/uL (ref 4.22–5.81)
RDW: 13.1 % (ref 11.5–15.5)
WBC: 21.9 10*3/uL — ABNORMAL HIGH (ref 4.0–10.5)
nRBC: 0 % (ref 0.0–0.2)

## 2018-08-11 LAB — COMPREHENSIVE METABOLIC PANEL
ALT: 16 U/L (ref 0–44)
AST: 18 U/L (ref 15–41)
Albumin: 4 g/dL (ref 3.5–5.0)
Alkaline Phosphatase: 61 U/L (ref 38–126)
Anion gap: 13 (ref 5–15)
BUN: 24 mg/dL — ABNORMAL HIGH (ref 8–23)
CO2: 22 mmol/L (ref 22–32)
Calcium: 9.4 mg/dL (ref 8.9–10.3)
Chloride: 102 mmol/L (ref 98–111)
Creatinine, Ser: 1.23 mg/dL (ref 0.61–1.24)
GFR calc Af Amer: >60 mL/min (ref >60)
GFR calc non Af Amer: 56 mL/min — ABNORMAL LOW (ref >60)
Glucose, Bld: 116 mg/dL — ABNORMAL HIGH (ref 70–99)
Potassium: 3.7 mmol/L (ref 3.5–5.1)
Sodium: 137 mmol/L (ref 135–145)
Total Bilirubin: 2.4 mg/dL — ABNORMAL HIGH (ref 0.3–1.2)
Total Protein: 7.7 g/dL (ref 6.5–8.1)

## 2018-08-11 LAB — URINALYSIS, ROUTINE W REFLEX MICROSCOPIC
Bilirubin Urine: NEGATIVE
GLUCOSE, UA: NEGATIVE mg/dL
HGB URINE DIPSTICK: NEGATIVE
Ketones, ur: 20 mg/dL — AB
Leukocytes, UA: NEGATIVE
Nitrite: NEGATIVE
PROTEIN: NEGATIVE mg/dL
Specific Gravity, Urine: 1.025 (ref 1.005–1.030)
pH: 5 (ref 5.0–8.0)

## 2018-08-11 LAB — INFLUENZA PANEL BY PCR (TYPE A & B)
Influenza A By PCR: NEGATIVE
Influenza B By PCR: NEGATIVE

## 2018-08-11 LAB — PROTIME-INR
INR: 1.09
Prothrombin Time: 14 seconds (ref 11.4–15.2)

## 2018-08-11 LAB — LACTIC ACID, PLASMA: Lactic Acid, Venous: 1.7 mmol/L (ref 0.5–1.9)

## 2018-08-11 MED ORDER — SODIUM CHLORIDE 0.9% FLUSH: 3.0000 mL | INTRAVENOUS | Status: DC | PRN

## 2018-08-11 MED ORDER — OXYCODONE HCL 5 MG PO TABS: 10.0000 mg | ORAL_TABLET | Freq: Four times a day (QID) | ORAL | Status: DC | PRN

## 2018-08-11 MED ORDER — ACETAMINOPHEN 325 MG PO TABS
650.0000 mg | ORAL_TABLET | Freq: Four times a day (QID) | ORAL | Status: DC | PRN
  Administered 2018-08-11: 650 mg via ORAL
  Filled 2018-08-11: qty 2

## 2018-08-11 MED ORDER — ONDANSETRON HCL 4 MG/2ML IJ SOLN: 4.0000 mg | Freq: Four times a day (QID) | INTRAMUSCULAR | Status: DC | PRN

## 2018-08-11 MED ORDER — SODIUM CHLORIDE 0.9 % IV BOLUS
1000.0000 mL | Freq: Once | INTRAVENOUS | Status: AC
  Administered 2018-08-11: 1000 mL via INTRAVENOUS

## 2018-08-11 MED ORDER — LEVALBUTEROL HCL 1.25 MG/0.5ML IN NEBU
1.2500 mg | INHALATION_SOLUTION | Freq: Four times a day (QID) | RESPIRATORY_TRACT | Status: DC
  Administered 2018-08-11 – 2018-08-12 (×2): 1.25 mg via RESPIRATORY_TRACT
  Filled 2018-08-11 (×2): qty 0.5

## 2018-08-11 MED ORDER — IPRATROPIUM BROMIDE 0.02 % IN SOLN
0.5000 mg | Freq: Four times a day (QID) | RESPIRATORY_TRACT | Status: DC
  Administered 2018-08-11 – 2018-08-14 (×10): 0.5 mg via RESPIRATORY_TRACT
  Filled 2018-08-11 (×11): qty 2.5

## 2018-08-11 MED ORDER — IPRATROPIUM BROMIDE 0.02 % IN SOLN
0.5000 mg | RESPIRATORY_TRACT | Status: DC
  Administered 2018-08-11: 0.5 mg via RESPIRATORY_TRACT
  Filled 2018-08-11: qty 2.5

## 2018-08-11 MED ORDER — DOXYCYCLINE HYCLATE 100 MG PO TABS
100.0000 mg | ORAL_TABLET | Freq: Two times a day (BID) | ORAL | Status: DC
  Administered 2018-08-11 – 2018-08-14 (×6): 100 mg via ORAL
  Filled 2018-08-11 (×6): qty 1

## 2018-08-11 MED ORDER — TRAZODONE HCL 50 MG PO TABS
25.0000 mg | ORAL_TABLET | Freq: Every evening | ORAL | Status: DC | PRN
  Administered 2018-08-13 (×2): 25 mg via ORAL
  Filled 2018-08-11 (×2): qty 1

## 2018-08-11 MED ORDER — DILTIAZEM HCL ER COATED BEADS 120 MG PO CP24
120.0000 mg | ORAL_CAPSULE | Freq: Every day | ORAL | Status: DC
  Administered 2018-08-11 – 2018-08-13 (×3): 120 mg via ORAL
  Filled 2018-08-11 (×3): qty 1

## 2018-08-11 MED ORDER — SODIUM CHLORIDE 0.9% FLUSH
3.0000 mL | Freq: Two times a day (BID) | INTRAVENOUS | Status: DC
  Administered 2018-08-11 – 2018-08-14 (×5): 3 mL via INTRAVENOUS

## 2018-08-11 MED ORDER — METHYLPREDNISOLONE SODIUM SUCC 125 MG IJ SOLR
60.0000 mg | Freq: Four times a day (QID) | INTRAMUSCULAR | Status: DC
  Administered 2018-08-11 – 2018-08-14 (×12): 60 mg via INTRAVENOUS
  Filled 2018-08-11 (×12): qty 2

## 2018-08-11 MED ORDER — ONDANSETRON HCL 4 MG PO TABS: 4.0000 mg | ORAL_TABLET | Freq: Four times a day (QID) | ORAL | Status: DC | PRN

## 2018-08-11 MED ORDER — ACETAMINOPHEN 650 MG RE SUPP: 650.0000 mg | Freq: Four times a day (QID) | RECTAL | Status: DC | PRN

## 2018-08-11 MED ORDER — SODIUM CHLORIDE 0.9 % IV SOLN: 250.0000 mL | INTRAVENOUS | Status: DC | PRN

## 2018-08-11 MED ORDER — IPRATROPIUM BROMIDE 0.02 % IN SOLN
0.5000 mg | Freq: Once | RESPIRATORY_TRACT | Status: AC
  Administered 2018-08-11: 0.5 mg via RESPIRATORY_TRACT
  Filled 2018-08-11: qty 2.5

## 2018-08-11 MED ORDER — MOMETASONE FURO-FORMOTEROL FUM 200-5 MCG/ACT IN AERO
2.0000 | INHALATION_SPRAY | Freq: Two times a day (BID) | RESPIRATORY_TRACT | Status: DC
  Administered 2018-08-11 – 2018-08-14 (×6): 2 via RESPIRATORY_TRACT
  Filled 2018-08-11: qty 8.8

## 2018-08-11 MED ORDER — SODIUM CHLORIDE 0.9 % IV SOLN
1.0000 g | Freq: Once | INTRAVENOUS | Status: AC
  Administered 2018-08-11: 1 g via INTRAVENOUS
  Filled 2018-08-11: qty 10

## 2018-08-11 MED ORDER — DILTIAZEM HCL ER COATED BEADS 240 MG PO CP24
240.0000 mg | ORAL_CAPSULE | Freq: Every morning | ORAL | Status: DC
  Administered 2018-08-11 – 2018-08-14 (×4): 240 mg via ORAL
  Filled 2018-08-11 (×2): qty 2
  Filled 2018-08-11: qty 1
  Filled 2018-08-11: qty 2
  Filled 2018-08-11: qty 1
  Filled 2018-08-11: qty 2
  Filled 2018-08-11: qty 1

## 2018-08-11 MED ORDER — SODIUM CHLORIDE 0.9 % IV SOLN
500.0000 mg | INTRAVENOUS | Status: DC
  Administered 2018-08-11 – 2018-08-13 (×3): 500 mg via INTRAVENOUS
  Filled 2018-08-11 (×6): qty 500

## 2018-08-11 MED ORDER — NITROGLYCERIN 0.4 MG SL SUBL: 0.4000 mg | SUBLINGUAL_TABLET | SUBLINGUAL | Status: DC | PRN

## 2018-08-11 MED ORDER — ASPIRIN EC 81 MG PO TBEC
81.0000 mg | DELAYED_RELEASE_TABLET | ORAL | Status: DC
  Administered 2018-08-11 – 2018-08-14 (×3): 81 mg via ORAL
  Filled 2018-08-11 (×4): qty 1

## 2018-08-11 MED ORDER — METHYLPREDNISOLONE SODIUM SUCC 125 MG IJ SOLR
125.0000 mg | Freq: Once | INTRAMUSCULAR | Status: AC
  Administered 2018-08-11: 125 mg via INTRAVENOUS
  Filled 2018-08-11: qty 2

## 2018-08-11 MED ORDER — PREGABALIN 75 MG PO CAPS
150.0000 mg | ORAL_CAPSULE | Freq: Three times a day (TID) | ORAL | Status: DC
  Administered 2018-08-11 – 2018-08-14 (×9): 150 mg via ORAL
  Filled 2018-08-11 (×10): qty 2

## 2018-08-11 MED ORDER — ENOXAPARIN SODIUM 40 MG/0.4ML ~~LOC~~ SOLN
40.0000 mg | SUBCUTANEOUS | Status: DC
  Administered 2018-08-11 – 2018-08-13 (×2): 40 mg via SUBCUTANEOUS
  Filled 2018-08-11 (×2): qty 0.4

## 2018-08-11 MED ORDER — PANTOPRAZOLE SODIUM 40 MG PO TBEC
40.0000 mg | DELAYED_RELEASE_TABLET | Freq: Every day | ORAL | Status: DC
  Administered 2018-08-11 – 2018-08-14 (×3): 40 mg via ORAL
  Filled 2018-08-11 (×4): qty 1

## 2018-08-11 MED ORDER — ACETAMINOPHEN 325 MG PO TABS
650.0000 mg | ORAL_TABLET | Freq: Once | ORAL | Status: AC
  Administered 2018-08-11: 650 mg via ORAL
  Filled 2018-08-11: qty 2

## 2018-08-11 MED ORDER — BISACODYL 5 MG PO TBEC: 5.0000 mg | DELAYED_RELEASE_TABLET | Freq: Every day | ORAL | Status: DC | PRN

## 2018-08-11 NOTE — ED Notes (Signed)
ED TO INPATIENT HANDOFF REPORT  Name/Age/Gender [name] [name] 77 y.o. male  Code Status Code Status History    Date Active Date Inactive Code Status Order ID Comments User Context   12/14/2017 1806 12/17/2017 1649 Full Code [id number]  Jani Gravel, MD Inpatient   12/07/2017 2128 12/10/2017 1940 Full Code [id number]  Merton Border, MD ED   11/25/2017 2306 11/28/2017 1644 Full Code [id number]  Reubin Milan, MD ED      Home/SNF/Other home  Chief Complaint sob  Level of Care/Admitting Diagnosis ED Disposition    ED Disposition Condition [place]: [facility] [[id number]]  Level of Care: Med-Surg [16]  Diagnosis: COPD with acute exacerbation [facility]) [[id number]]  Admitting Physician: [place], [place]  Attending Physician: Murlean Iba [[id number]]  Estimated length of stay: past midnight tomorrow  Certification:: I certify this patient will need inpatient services for at least 2 midnights  PT Class (Do Not Modify): Inpatient [101]  PT Acc Code (Do Not Modify): Private [1]       Medical History Past Medical History:  Diagnosis Date  . AAA (abdominal aortic aneurysm) ([place])   . Asthma   . COPD with emphysema ([place])    Dr. Joya Gaskins, Gold stage C  . DJD (degenerative joint disease), lumbar   . Emphysema of lung ([place])   . History of prostate cancer    Status post radioactive seed implants  . History of shingles   . Incomplete emptying of bladder   . Mixed hyperlipidemia   . PAF (paroxysmal atrial fibrillation) ([place])   . Palpitations   . Peripheral neuropathy   . Prostate cancer ([place])   . Sciatic nerve pain    Secondary to shingles 2011  . Urethral stricture   . Wears partial dentures    Upper and lower    Allergies Allergies  Allergen Reactions  . Albuterol Other (See Comments)    Continuous nebulization caused extreme tachycardia. May use the inhaler.    IV Location/Drains/Wounds Patient Lines/Drains/Airways Status   Active  Line/Drains/Airways    Name:   Placement date:   Placement time:   Site:   Days:   Peripheral IV 08/11/18 Right Antecubital   08/11/18    0825    Antecubital   less than 1   Peripheral IV 08/11/18 Left Antecubital   08/11/18    0836    Antecubital   less than 1   Incision (Closed) 06/16/18 Other (Comment)   06/16/18    0937     56          Labs/Imaging Results for orders placed or performed during the hospital encounter of 08/11/18 (from the past 48 hour(s))  Urinalysis, Routine w reflex microscopic     Status: Abnormal   Collection Time: 08/11/18  8:23 AM  Result Value Ref Range   Color, Urine YELLOW YELLOW   APPearance CLEAR CLEAR   Specific Gravity, Urine 1.025 1.005 - 1.030   pH 5.0 5.0 - 8.0   Glucose, UA NEGATIVE NEGATIVE mg/dL   Hgb urine dipstick NEGATIVE NEGATIVE   Bilirubin Urine NEGATIVE NEGATIVE   Ketones, ur 20 (A) NEGATIVE mg/dL   Protein, ur NEGATIVE NEGATIVE mg/dL   Nitrite NEGATIVE NEGATIVE   Leukocytes, UA NEGATIVE NEGATIVE    Comment: Performed at [facility], [address], [place], [place] [address]  Comprehensive metabolic panel     Status: Abnormal   Collection Time: 08/11/18  8:40 AM  Result Value  Ref Range   Sodium 137 135 - 145 mmol/L   Potassium 3.7 3.5 - 5.1 mmol/L   Chloride 102 98 - 111 mmol/L   CO2 22 22 - 32 mmol/L   Glucose, Bld 116 (H) 70 - 99 mg/dL   BUN 24 (H) 8 - 23 mg/dL   Creatinine, Ser 1.23 0.61 - 1.24 mg/dL   Calcium 9.4 8.9 - 10.3 mg/dL   Total Protein 7.7 6.5 - 8.1 g/dL   Albumin 4.0 3.5 - 5.0 g/dL   AST 18 15 - 41 U/L   ALT 16 0 - 44 U/L   Alkaline Phosphatase 61 38 - 126 U/L   Total Bilirubin 2.4 (H) 0.3 - 1.2 mg/dL   GFR calc non Af Amer 56 (L) >60 mL/min   GFR calc Af Amer >60 >60 mL/min   Anion gap 13 5 - 15    Comment: Performed at [facility], [address], [place], [place] [address]  CBC with Differential     Status: Abnormal   Collection Time: 08/11/18  8:40 AM  Result Value Ref Range   WBC 21.9 (H) 4.0 - 10.5  K/uL   RBC 5.58 4.22 - 5.81 MIL/uL   Hemoglobin 16.9 13.0 - 17.0 g/dL   HCT 50.0 39.0 - 52.0 %   MCV 89.6 80.0 - 100.0 fL   MCH 30.3 26.0 - 34.0 pg   MCHC 33.8 30.0 - 36.0 g/dL   RDW 13.1 11.5 - 15.5 %   Platelets 250 150 - 400 K/uL   nRBC 0.0 0.0 - 0.2 %   Neutrophils Relative % 78 %   Neutro Abs 17.0 (H) 1.7 - 7.7 K/uL   Lymphocytes Relative 9 %   Lymphs Abs 1.9 0.7 - 4.0 K/uL   Monocytes Relative 13 %   Monocytes Absolute 2.8 (H) 0.1 - 1.0 K/uL   Eosinophils Relative 0 %   Eosinophils Absolute 0.0 0.0 - 0.5 K/uL   Basophils Relative 0 %   Basophils Absolute 0.1 0.0 - 0.1 K/uL   Immature Granulocytes 0 %   Abs Immature Granulocytes 0.09 (H) 0.00 - 0.07 K/uL    Comment: Performed at [facility], [address] [place], [place] [address]  Protime-INR     Status: None   Collection Time: 08/11/18  8:40 AM  Result Value Ref Range   Prothrombin Time 14.0 11.4 - 15.2 seconds   INR 1.09     Comment: Performed at [facility], [address] [place], [place] [address]  Culture, blood (Routine x 2)     Status: None (Preliminary result)   Collection Time: 08/11/18  8:40 AM  Result Value Ref Range   Specimen Description RIGHT ANTECUBITAL    Special Requests      BOTTLES DRAWN AEROBIC AND ANAEROBIC Blood Culture adequate volume Performed at [facility], [address] [place], [place] [address]    Culture PENDING    Report Status PENDING   Lactic acid, plasma     Status: None   Collection Time: 08/11/18  8:40 AM  Result Value Ref Range   Lactic Acid, Venous 1.7 0.5 - 1.9 mmol/L    Comment: Performed at [facility], [address] [place], [place] [address]  Culture, blood (Routine x 2)     Status: None (Preliminary result)   Collection Time: 08/11/18  8:47 AM  Result Value Ref Range   Specimen Description BLOOD LEFT FOREARM    Special Requests      BOTTLES DRAWN AEROBIC  AND ANAEROBIC Blood Culture adequate volume Performed at [facility], [address], [place],   [address]    Culture PENDING    Report Status PENDING    Dg Chest 2 View  Result Date: 08/11/2018 CLINICAL DATA:  Shortness of breath and cough EXAM: CHEST - 2 VIEW COMPARISON:  April 22, 2018 FINDINGS: Lungs remain mildly hyperexpanded with prominence of the retrosternal clear space, findings indicative of emphysematous change, stable. There are scattered areas of scarring bilaterally with diminished vascularity in the upper lobes, a stable finding again consistent with underlying emphysematous change. There is no edema or consolidation. Heart size and pulmonary vascularity normal. No adenopathy. There is aortic atherosclerosis. No adenopathy. No bone lesions. IMPRESSION: Stable underlying emphysematous change with areas of mild scarring. Aortic atherosclerosis noted. No edema or consolidation. Stable cardiac silhouette. Aortic Atherosclerosis (ICD10-I70.0) and Emphysema (ICD10-J43.9). Electronically Signed   By: Lowella Grip III M.D.   On: 08/11/2018 09:35   EKG Interpretation  Date/Time:  Wednesday August 11 2018 08:18:22 EST Ventricular Rate:  125 PR Interval:    QRS Duration: 79 QT Interval:  299 QTC Calculation: 432 R Axis:   70 Text Interpretation:  Sinus tachycardia RSR' in V1 or V2, probably normal variant Borderline ST depression, lateral leads Artifact in lead(s) V5 Confirmed by Nat Christen [phone]) on 08/11/2018 8:22:56 AM   Pending Labs Unresulted Labs (From admission, onward)    Start     Ordered   08/11/18 1036  Influenza panel by PCR (type A & B)  (Influenza PCR Panel)  Once,   R     08/11/18 1035   08/11/18 0823  Urine culture  ONCE - STAT,   STAT     08/11/18 0823   Signed and Held  CBC  (enoxaparin (LOVENOX)    CrCl >/= 30 ml/min)  Once,   R    Comments:  Baseline for enoxaparin therapy IF NOT ALREADY DRAWN.  Notify MD if PLT < 100 K.    Signed and Held   Signed and Held  Creatinine, serum  (enoxaparin (LOVENOX)    CrCl >/= 30 ml/min)  Once,   R    Comments:   Baseline for enoxaparin therapy IF NOT ALREADY DRAWN.    Signed and Held   Signed and Held  Creatinine, serum  (enoxaparin (LOVENOX)    CrCl >/= 30 ml/min)  Weekly,   R    Comments:  while on enoxaparin therapy    Signed and Held   Signed and Held  Basic metabolic panel  Tomorrow morning,   R     Signed and Held   Signed and Held  Magnesium  Tomorrow morning,   R     Signed and Held   Signed and Held  CBC WITH DIFFERENTIAL  Daily,   R     Signed and Held          Vitals/Pain Today's Vitals   08/11/18 1030 08/11/18 1045 08/11/18 1100 08/11/18 1111  BP: (!) 123/57  133/61   Pulse:  (!) 102 98   Resp: 20 (!) 23 (!) 22   Temp:    99.2 F (37.3 C)  TempSrc:    Oral  SpO2:  99% 100%   Weight:      Height:      PainSc:        Isolation Precautions Droplet precaution  Medications Medications  azithromycin (ZITHROMAX) 500 mg in sodium chloride 0.9 % 250 mL IVPB (0 mg Intravenous Stopped  08/11/18 1011)  acetaminophen (TYLENOL) tablet 650 mg (650 mg Oral Given 08/11/18 0829)  sodium chloride 0.9 % bolus 1,000 mL (0 mLs Intravenous Stopped 08/11/18 1008)  sodium chloride 0.9 % bolus 1,000 mL ( Intravenous Rate/Dose Verify 08/11/18 0930)  cefTRIAXone (ROCEPHIN) 1 g in sodium chloride 0.9 % 100 mL IVPB ( Intravenous Stopped 08/11/18 0928)  ipratropium (ATROVENT) nebulizer solution 0.5 mg (0.5 mg Nebulization Given 08/11/18 0847)  methylPREDNISolone sodium succinate (SOLU-MEDROL) 125 mg/2 mL injection 125 mg (125 mg Intravenous Given 08/11/18 1017)    Mobility ambulatory

## 2018-08-11 NOTE — ED Notes (Signed)
Reduced O2 to 3.5L via nasal canula.

## 2018-08-11 NOTE — H&P (Signed)
History and Physical  [name] [name] LNL:[id number] DOB: Nov 18, 1940 DOA: 08/11/2018  Referring physician: Lacinda Axon, MD PCP: [name] Squibb, MD   Chief Complaint: SOB   HPI: [name] [name] is a 77 y.o. male with oxygen dependent, prednisone dependent COPD apparently has been having upper respiratory symptoms at home for the past several days and his symptoms have progressed.  He is developed fever.  He has had some chest pressure.  He has had a dry cough.  His symptoms are reported to be moderate to severe.  Exertion makes his symptoms much worse.  EMS evaluated him at home increase his oxygen to 6 L from 2 L.  And they brought him to the emergency department for further evaluation.  He was noted to have an elevated white blood cell count of 21.  His chest x-ray did not show any acute infiltrate but clinically he was noted to be in severe acute COPD exacerbation.  He was started on IV fluids, IV steroids and antibiotics.  The patient was febrile to 101 F on arrival.  With supportive care he began to show some improvement but was not able to improve enough to be able to go home.  He is being admitted for hospital care with IV steroids, and respiratory treatments.  The patient reports that he is unable to tolerate albuterol nebs secondary to paroxysmal atrial fibrillation.  He is followed by cardiology for that.  He is not interested in anticoagulation for his atrial fibrillation according to cardiology records.  Review of Systems: Unable to fully obtain due to patient's acute condition  Past Medical History:  Diagnosis Date  . AAA (abdominal aortic aneurysm) ([place])   . Asthma   . COPD with emphysema ([place])    Dr. Joya [name], Gold stage C  . DJD (degenerative joint disease), lumbar   . Emphysema of lung ([place])   . History of prostate cancer    Status post radioactive seed implants  . History of shingles   . Incomplete emptying of bladder   . Mixed hyperlipidemia   . PAF (paroxysmal atrial fibrillation)  ([place])   . Palpitations   . Peripheral neuropathy   . Prostate cancer ([place])   . Sciatic nerve pain    Secondary to shingles 2011  . Urethral stricture   . Wears partial dentures    Upper and lower   Past Surgical History:  Procedure Laterality Date  . CATARACT EXTRACTION W/ INTRAOCULAR LENS  IMPLANT, BILATERAL  2012  . COLONOSCOPY  02-14-2003  . CYSTO/  BALLOON DILATION AND INCISION BLADDER NECK CONTRACTURE  X5  LAST ONE 01-24-2009  . CYSTOSCOPY WITH URETHRAL DILATATION  09/15/2011   Procedure: CYSTOSCOPY WITH URETHRAL DILATATION;  Surgeon: [name] Amass, MD;  Location[facility];  Service: Urology;  Laterality: N/A;  CYSTOSCOPY, BALLOON DILATION   . CYSTOSCOPY WITH URETHRAL DILATATION N/A 04/03/2014   Procedure: CYSTOSCOPY WITH dilatation;  Surgeon: [name] Amass, MD;  Location: [facility];  Service: Urology;  Laterality: N/A;  . EYE SURGERY    . MASS EXCISION N/A 06/16/2018   Procedure: EXCISION PERIANAL CYST;  Surgeon: [name] Signs, MD;  Location: [facility];  Service: General;  Laterality: N/A;  . RADIOACTIVE PROSTATE SEED IMPLANTS/  CYSTO WITH BALLOON DILATION BLADDER NECK CONTRACTURE  11-26-2006   Social History:  reports that he quit smoking about 19 years ago. His smoking use included cigarettes. He started smoking about 64 years ago. He has a 80.00 pack-year  smoking history. He has never used smokeless tobacco. He reports that he does not drink alcohol or use drugs.  Allergies  Allergen Reactions  . Albuterol Other (See Comments)    Continuous nebulization caused extreme tachycardia. May use the inhaler.    Family History  Problem Relation Age of Onset  . COPD Mother   . Heart disease Brother        died at 67    Prior to Admission medications   Medication Sig Start Date End Date Taking? Authorizing Provider  albuterol (PROVENTIL HFA;VENTOLIN HFA) 108 (90 Base) MCG/ACT inhaler Inhale 1 puff into the lungs every 6 (six) hours as needed  for wheezing or shortness of breath.    [provider]  aspirin EC 81 MG tablet Take 1 tablet (81 mg total) by mouth every other day. Patient taking differently: Take 81 mg by mouth every Monday, Wednesday, and Friday.  05/05/18   [name] Sark, MD  diltiazem (DILT-XR) 120 MG 24 hr capsule TAKE 2 CAPSULES BY MOUTH IN THE MORNING AND 1 IN THE EVENING 07/22/18   [name] Sark, MD  Fluticasone-Umeclidin-Vilant (TRELEGY ELLIPTA) 100-62.5-25 MCG/INH AEPB Inhale 1 puff into the lungs daily.     [provider]  LYRICA 150 MG capsule TAKE ONE CAPSULE BY MOUTH THREE TIMES DAILY Patient taking differently: Take 150 mg by mouth every 8 (eight) hours.  05/23/14   [name] Pacas, MD  nitroGLYCERIN (NITROSTAT) 0.4 MG SL tablet Place 1 tablet (0.4 mg total) under the tongue every 5 (five) minutes as needed for chest pain. 04/22/18   [name] Christen, MD  predniSONE (DELTASONE) 10 MG tablet Take 1 tablet (10 mg total) by mouth daily with breakfast. 04/20/18   [name] Gobble, MD   Physical Exam: Vitals:   08/11/18 0936 08/11/18 1008 08/11/18 1015 08/11/18 1030  BP: 121/65 (!) 101/59  (!) 123/57  Pulse: (!) 113 (!) 107 (!) 104   Resp: (!) 25 16 20 20   Temp: 98.9 F (37.2 C)     TempSrc: Oral     SpO2: 99% 98% 99%   Weight:      Height:         General exam: The patient appears acutely ill, chronically ill-appearing, emaciated male and in moderate distress moderately built and nourished patient, lying comfortably supine on the gurney.  Head, eyes and ENT: Nontraumatic and normocephalic. Pupils equally reacting to light and accommodation. Oral mucosa dry  Neck: Supple. No JVD, carotid bruit or thyromegaly.  Lymphatics: No lymphadenopathy.  Respiratory system: Diffuse Rales and expiratory wheezes.  Moderate increased work of breathing.  Patient is tachypneic.  He has poor air movement.  Cardiovascular system: S1 and S2 heard, RRR. No JVD, murmurs, gallops, clicks or pedal  edema.  Gastrointestinal system: Abdomen is nondistended, soft and nontender. Normal bowel sounds heard. No organomegaly or masses appreciated.  Central nervous system: Alert and oriented. No focal neurological deficits.  Extremities: Symmetric 5 x 5 power. Peripheral pulses symmetrically felt.   Skin: No rashes or acute findings.  Musculoskeletal system: Negative exam.  Psychiatry: Unable to assess.  Labs on Admission:  Basic Metabolic Panel: Recent Labs  Lab 08/11/18 0840  NA 137  K 3.7  CL 102  CO2 22  GLUCOSE 116*  BUN 24*  CREATININE 1.23  CALCIUM 9.4   Liver Function Tests: Recent Labs  Lab 08/11/18 0840  AST 18  ALT 16  ALKPHOS 61  BILITOT 2.4*  PROT 7.7  ALBUMIN 4.0  No results for input(s): LIPASE, AMYLASE in the last 168 hours. No results for input(s): AMMONIA in the last 168 hours. CBC: Recent Labs  Lab 08/11/18 0840  WBC 21.9*  NEUTROABS 17.0*  HGB 16.9  HCT 50.0  MCV 89.6  PLT 250   Cardiac Enzymes: No results for input(s): CKTOTAL, CKMB, CKMBINDEX, TROPONINI in the last 168 hours.  BNP (last 3 results) No results for input(s): PROBNP in the last 8760 hours. CBG: No results for input(s): GLUCAP in the last 168 hours.  Radiological Exams on Admission: Dg Chest 2 View  Result Date: 08/11/2018 CLINICAL DATA:  Shortness of breath and cough EXAM: CHEST - 2 VIEW COMPARISON:  April 22, 2018 FINDINGS: Lungs remain mildly hyperexpanded with prominence of the retrosternal clear space, findings indicative of emphysematous change, stable. There are scattered areas of scarring bilaterally with diminished vascularity in the upper lobes, a stable finding again consistent with underlying emphysematous change. There is no edema or consolidation. Heart size and pulmonary vascularity normal. No adenopathy. There is aortic atherosclerosis. No adenopathy. No bone lesions. IMPRESSION: Stable underlying emphysematous change with areas of mild scarring. Aortic  atherosclerosis noted. No edema or consolidation. Stable cardiac silhouette. Aortic Atherosclerosis (ICD10-I70.0) and Emphysema (ICD10-J43.9). Electronically Signed   By: Lowella Grip III M.D.   On: 08/11/2018 09:35    EKG: Independently reviewed.  He appears to be in a sinus tachycardia.  Assessment/Plan Principal Problem:   COPD with acute exacerbation (HCC) Active Problems:   ATRIAL FIBRILLATION, PAROXYSMAL   Personal history of prostate cancer   Mixed hyperlipidemia   Neuropathy   Emphysema of lung (HCC)   Abdominal aortic aneurysm (AAA) without rupture (HCC)   Orthostatic hypotension   Leukocytosis   Protein-calorie malnutrition, severe   Wears partial dentures   1. Acute respiratory failure with hypoxia-secondary to acute COPD exacerbation.  Admit to inpatient, continue IV steroids, antibiotics and schedule Atrovent treatments.  Continue supportive care.  Patient is improving with treatment started in the ED.  Will arrange for MedSurg bed. 2. Acute COPD exacerbation-treating as above.   3. Paroxysmal atrial fibrillation- patient says that he cannot tolerate albuterol nebulizer treatments therefore we are using Atrovent treatments.  According to cardiology records that I reviewed the patient has no interest in full anticoagulation at this time. 4. Leukocytosis-likely secondary to chronic steroid use and acute infection.  Follow CBC with differential. 5. Fever-Tylenol ordered as needed.  Treating infection as above.  Follow vitals.  DVT Prophylaxis: Lovenox Code Status: Full Family Communication: Updated at bedside Disposition Plan: Inpatient for IV steroids and respiratory support  Time spent: 59 minutes  Irwin Brakeman, MD [facility] Pager [phone]  If 7PM-7AM, please contact night-coverage [email or web address] Password [facility] 08/11/2018, 10:46 AM

## 2018-08-11 NOTE — ED Provider Notes (Signed)
[facility] EMERGENCY DEPARTMENT Provider Note   CSN: [id number] Arrival date & time: 08/11/18  0813     History   Chief Complaint Chief Complaint  Patient presents with  . Shortness of Breath    HPI [name] [name] is a 77 y.o. male.  Level 5 caveat for acuity of condition.  Patient has known COPD and is on 2-3 L of nasal cannula on a regular basis.  He has been short of breath since Sunday with associated dry cough and fever.  Review of systems positive for chest pressure that began this morning.  Severity of symptoms is moderate to severe.  Exertion makes symptoms worse.  EMS bumped up O2 from 2-6 L this morning     Past Medical History:  Diagnosis Date  . AAA (abdominal aortic aneurysm) ([place])   . Asthma   . COPD with emphysema ([place])    Dr. Joya Gaskins, Gold stage C  . DJD (degenerative joint disease), lumbar   . Emphysema of lung ([place])   . History of prostate cancer    Status post radioactive seed implants  . History of shingles   . Incomplete emptying of bladder   . Mixed hyperlipidemia   . PAF (paroxysmal atrial fibrillation) ([place])   . Palpitations   . Peripheral neuropathy   . Prostate cancer ([place])   . Sciatic nerve pain    Secondary to shingles 2011  . Urethral stricture   . Wears partial dentures    Upper and lower    Patient Active Problem List   Diagnosis Date Noted  . Sebaceous cyst   . Chronic venous insufficiency 03/31/2018  . SIRS (systemic inflammatory response syndrome) ([place]) 12/15/2017  . Chronic respiratory failure ([place]) 12/15/2017  . Elevated lactic acid level   . Hypotension   . Tachycardia 12/14/2017  . Malnutrition of moderate degree 12/09/2017  . Sepsis ([place]) 12/07/2017  . Protein-calorie malnutrition, severe 11/27/2017  . Orthostatic hypotension 11/25/2017  . Leukocytosis 11/25/2017  . Abdominal aortic aneurysm (AAA) without rupture ([place]) 10/27/2017  . Multiple lung nodules on CT 05/14/2016  . COPD, severe ([place]) 02/19/2016  .  Emphysema of lung ([place]) 02/19/2016  . Personal history of prostate cancer   . Mixed hyperlipidemia   . DJD (degenerative joint disease), lumbar   . Neuropathy   . Back pain   . 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 ATRIAL FIBRILLATION, PAROXYSMAL 08/31/2007    Past Surgical History:  Procedure Laterality Date  . CATARACT EXTRACTION W/ INTRAOCULAR LENS  IMPLANT, BILATERAL  2012  . COLONOSCOPY  02-14-2003  . CYSTO/  BALLOON DILATION AND INCISION BLADDER NECK CONTRACTURE  X5  LAST ONE 01-24-2009  . CYSTOSCOPY WITH URETHRAL DILATATION  09/15/2011   Procedure: CYSTOSCOPY WITH URETHRAL DILATATION;  Surgeon: Bernestine Amass, MD;  Location: [facility];  Service: Urology;  Laterality: N/A;  CYSTOSCOPY, BALLOON DILATION   . CYSTOSCOPY WITH URETHRAL DILATATION N/A 04/03/2014   Procedure: CYSTOSCOPY WITH dilatation;  Surgeon: Bernestine Amass, MD;  Location: [facility];  Service: Urology;  Laterality: N/A;  . EYE SURGERY    . MASS EXCISION N/A 06/16/2018   Procedure: EXCISION PERIANAL CYST;  Surgeon: Aviva Signs, MD;  Location: [facility];  Service: General;  Laterality: N/A;  . RADIOACTIVE PROSTATE SEED IMPLANTS/  CYSTO WITH BALLOON DILATION BLADDER NECK CONTRACTURE  11-26-2006        Home Medications    Prior to Admission medications   Medication Sig Start Date End Date  Taking? Authorizing Provider  albuterol (PROVENTIL HFA;VENTOLIN HFA) 108 (90 Base) MCG/ACT inhaler Inhale 1 puff into the lungs every 6 (six) hours as needed for wheezing or shortness of breath.    [provider]  aspirin EC 81 MG tablet Take 1 tablet (81 mg total) by mouth every other day. Patient taking differently: Take 81 mg by mouth every Monday, Wednesday, and Friday.  05/05/18   Satira Sark, MD  diltiazem (DILT-XR) 120 MG 24 hr capsule TAKE 2 CAPSULES BY MOUTH IN THE MORNING AND 1 IN THE EVENING 07/22/18   Satira Sark, MD  Fluticasone-Umeclidin-Vilant (TRELEGY ELLIPTA)  100-62.5-25 MCG/INH AEPB Inhale 1 puff into the lungs daily.     [provider]  LYRICA 150 MG capsule TAKE ONE CAPSULE BY MOUTH THREE TIMES DAILY Patient taking differently: Take 150 mg by mouth every 8 (eight) hours.  05/23/14   Marcial Pacas, MD  nitroGLYCERIN (NITROSTAT) 0.4 MG SL tablet Place 1 tablet (0.4 mg total) under the tongue every 5 (five) minutes as needed for chest pain. 04/22/18   Nat Christen, MD  predniSONE (DELTASONE) 10 MG tablet Take 1 tablet (10 mg total) by mouth daily with breakfast. 04/20/18   Collene Gobble, MD    Family History Family History  Problem Relation Age of Onset  . COPD Mother   . Heart disease Brother        died at 71    Social History Social History   Tobacco Use  . Smoking status: Former Smoker    Packs/day: 2.00    Years: 40.00    Pack years: 80.00    Types: Cigarettes    Start date: 06/19/1954    Last attempt to quit: 09/15/1998    Years since quitting: 19.9  . Smokeless tobacco: Never Used  . Tobacco comment: Counseled to remain smoke free  Substance Use Topics  . Alcohol use: No    Alcohol/week: 0.0 standard drinks  . Drug use: No     Allergies   Albuterol   Review of Systems Review of Systems  Unable to perform ROS: Acuity of condition     Physical Exam Updated Vital Signs BP 121/65 (BP Location: Left Arm)   Pulse (!) 113   Temp 98.9 F (37.2 C) (Oral)   Resp (!) 25   Ht 5\' 10"  (1.778 m)   Wt 75.8 kg   SpO2 99%   BMI 23.96 kg/m   Physical Exam  Constitutional: He is oriented to person, place, and time.  Pale, alert, tachypneic  HENT:  Head: Normocephalic and atraumatic.  Eyes: Conjunctivae are normal.  Neck: Neck supple.  Cardiovascular: Normal rate and regular rhythm.  Pulmonary/Chest:  Tachypneic and dyspneic, not moving air well.  Abdominal: Soft. Bowel sounds are normal.  Musculoskeletal: Normal range of motion.  Neurological: He is alert and oriented to person, place, and time.  Skin: Skin is  warm and dry.  Psychiatric: He has a normal mood and affect. His behavior is normal.  Nursing note and vitals reviewed.    ED Treatments / Results  Labs (all labs ordered are listed, but only abnormal results are displayed) Labs Reviewed  COMPREHENSIVE METABOLIC PANEL - Abnormal; Notable for the following components:      Result Value   Glucose, Bld 116 (*)    BUN 24 (*)    Total Bilirubin 2.4 (*)    GFR calc non Af Amer 56 (*)    All other components within normal limits  CBC WITH DIFFERENTIAL/PLATELET - Abnormal; Notable for the following components:   WBC 21.9 (*)    Neutro Abs 17.0 (*)    Monocytes Absolute 2.8 (*)    Abs Immature Granulocytes 0.09 (*)    All other components within normal limits  URINALYSIS, ROUTINE W REFLEX MICROSCOPIC - Abnormal; Notable for the following components:   Ketones, ur 20 (*)    All other components within normal limits  CULTURE, BLOOD (ROUTINE X 2)  CULTURE, BLOOD (ROUTINE X 2)  URINE CULTURE  PROTIME-INR  LACTIC ACID, PLASMA  LACTIC ACID, PLASMA    EKG EKG Interpretation  Date/Time:  Wednesday August 11 2018 08:18:22 EST Ventricular Rate:  125 PR Interval:    QRS Duration: 79 QT Interval:  299 QTC Calculation: 432 R Axis:   70 Text Interpretation:  Sinus tachycardia RSR' in V1 or V2, probably normal variant Borderline ST depression, lateral leads Artifact in lead(s) V5 Confirmed by Nat Christen [id number]) on 08/11/2018 8:22:56 AM   Radiology Dg Chest 2 View  Result Date: 08/11/2018 CLINICAL DATA:  Shortness of breath and cough EXAM: CHEST - 2 VIEW COMPARISON:  April 22, 2018 FINDINGS: Lungs remain mildly hyperexpanded with prominence of the retrosternal clear space, findings indicative of emphysematous change, stable. There are scattered areas of scarring bilaterally with diminished vascularity in the upper lobes, a stable finding again consistent with underlying emphysematous change. There is no edema or consolidation. Heart size  and pulmonary vascularity normal. No adenopathy. There is aortic atherosclerosis. No adenopathy. No bone lesions. IMPRESSION: Stable underlying emphysematous change with areas of mild scarring. Aortic atherosclerosis noted. No edema or consolidation. Stable cardiac silhouette. Aortic Atherosclerosis (ICD10-I70.0) and Emphysema (ICD10-J43.9). Electronically Signed   By: Lowella Grip III M.D.   On: 08/11/2018 09:35    Procedures Procedures (including critical care time)  Medications Ordered in ED Medications  azithromycin (ZITHROMAX) 500 mg in sodium chloride 0.9 % 250 mL IVPB ( Intravenous Rate/Dose Verify 08/11/18 0930)  methylPREDNISolone sodium succinate (SOLU-MEDROL) 125 mg/2 mL injection 125 mg (has no administration in time range)  acetaminophen (TYLENOL) tablet 650 mg (650 mg Oral Given 08/11/18 0829)  sodium chloride 0.9 % bolus 1,000 mL (1,000 mLs Intravenous New Bag/Given 08/11/18 0844)  sodium chloride 0.9 % bolus 1,000 mL ( Intravenous Rate/Dose Verify 08/11/18 0930)  cefTRIAXone (ROCEPHIN) 1 g in sodium chloride 0.9 % 100 mL IVPB ( Intravenous Stopped 08/11/18 0928)  ipratropium (ATROVENT) nebulizer solution 0.5 mg (0.5 mg Nebulization Given 08/11/18 0847)     Initial Impression / Assessment and Plan / ED Course  I have reviewed the triage vital signs and the nursing notes.  Pertinent labs & imaging results that were available during my care of the patient were reviewed by me and considered in my medical decision making (see chart for details).    Patient presents with worsening respiratory status, fever, chest pain.  He is tachycardic.  Initial white count 21.9 K.  Chest x-ray shows no obvious edema or consolidation.  IV fluids, IV antibiotics, IV steroids, Atrovent nebulizer treatment administered.  CRITICAL CARE Performed by: Nat Christen Total critical care time: 30 minutes Critical care time was exclusive of separately billable procedures and treating other  patients. Critical care was necessary to treat or prevent imminent or life-threatening deterioration. Critical care was time spent personally by me on the following activities: development of treatment plan with patient and/or surrogate as well as nursing, discussions with consultants, evaluation of patient's response to treatment, examination of patient, obtaining  history from patient or surrogate, ordering and performing treatments and interventions, ordering and review of laboratory studies, ordering and review of radiographic studies, pulse oximetry and re-evaluation of patient's condition.  Final Clinical Impressions(s) / ED Diagnoses   Final diagnoses:  COPD exacerbation ([place])  Fever, unspecified fever cause    ED Discharge Orders    None       Nat Christen, MD 08/11/18 1005

## 2018-08-11 NOTE — ED Triage Notes (Addendum)
Pt brought in by EMS due to SOB since Sunday. Pt has COPD and chronic O2 @ 3 L/via [place]. Pt reports chest pressure that began this am. EMS increased 02 to 6 L/via [place] Non Productive cough

## 2018-08-11 NOTE — ED Notes (Signed)
2 sets of blood cultures obtained at this time

## 2018-08-12 DIAGNOSIS — Z8546 Personal history of malignant neoplasm of prostate: Secondary | ICD-10-CM

## 2018-08-12 DIAGNOSIS — J439 Emphysema, unspecified: Principal | ICD-10-CM

## 2018-08-12 LAB — CBC WITH DIFFERENTIAL/PLATELET
Abs Immature Granulocytes: 0.05 10*3/uL (ref 0.00–0.07)
BASOS PCT: 0 %
Basophils Absolute: 0 10*3/uL (ref 0.0–0.1)
EOS ABS: 0 10*3/uL (ref 0.0–0.5)
Eosinophils Relative: 0 %
HCT: 42.5 % (ref 39.0–52.0)
Hemoglobin: 14.5 g/dL (ref 13.0–17.0)
IMMATURE GRANULOCYTES: 0 %
Lymphocytes Relative: 7 %
Lymphs Abs: 0.9 10*3/uL (ref 0.7–4.0)
MCH: 30.7 pg (ref 26.0–34.0)
MCHC: 34.1 g/dL (ref 30.0–36.0)
MCV: 90 fL (ref 80.0–100.0)
Monocytes Absolute: 0.3 10*3/uL (ref 0.1–1.0)
Monocytes Relative: 2 %
NEUTROS PCT: 91 %
NRBC: 0 % (ref 0.0–0.2)
Neutro Abs: 11.7 10*3/uL — ABNORMAL HIGH (ref 1.7–7.7)
PLATELETS: 227 10*3/uL (ref 150–400)
RBC: 4.72 MIL/uL (ref 4.22–5.81)
RDW: 12.5 % (ref 11.5–15.5)
WBC: 13 10*3/uL — ABNORMAL HIGH (ref 4.0–10.5)

## 2018-08-12 LAB — BASIC METABOLIC PANEL
Anion gap: 9 (ref 5–15)
BUN: 24 mg/dL — ABNORMAL HIGH (ref 8–23)
CO2: 22 mmol/L (ref 22–32)
Calcium: 9 mg/dL (ref 8.9–10.3)
Chloride: 109 mmol/L (ref 98–111)
Creatinine, Ser: 0.94 mg/dL (ref 0.61–1.24)
GFR calc Af Amer: >60 mL/min (ref >60)
GLUCOSE: 178 mg/dL — AB (ref 70–99)
POTASSIUM: 3.6 mmol/L (ref 3.5–5.1)
Sodium: 140 mmol/L (ref 135–145)

## 2018-08-12 LAB — URINE CULTURE: Culture: <10000 — AB

## 2018-08-12 LAB — MAGNESIUM: Magnesium: 2.3 mg/dL (ref 1.7–2.4)

## 2018-08-12 MED ORDER — LEVALBUTEROL HCL 1.25 MG/0.5ML IN NEBU
1.2500 mg | INHALATION_SOLUTION | Freq: Four times a day (QID) | RESPIRATORY_TRACT | Status: DC
  Administered 2018-08-12 – 2018-08-14 (×8): 1.25 mg via RESPIRATORY_TRACT
  Filled 2018-08-12 (×9): qty 0.5

## 2018-08-12 MED ORDER — GUAIFENESIN ER 600 MG PO TB12
600.0000 mg | ORAL_TABLET | Freq: Two times a day (BID) | ORAL | Status: DC
  Administered 2018-08-12 – 2018-08-13 (×3): 600 mg via ORAL
  Filled 2018-08-12 (×3): qty 1

## 2018-08-12 NOTE — Progress Notes (Signed)
PROGRESS NOTE  [name] [name]  GYB:[id number]  DOB: 1940/12/05  DOA: 08/11/2018 PCP: [name], MD   Brief Admission Hx: 77 y.o. male with oxygen dependent, prednisone dependent COPD apparently has been having upper respiratory symptoms at home for the past several days and his symptoms have progressed. He was admitted with an acute COPD exacerbation.   MDM/Assessment & Plan: 1. Acute respiratory failure with hypoxia-secondary to acute COPD exacerbation.  He remains SOB with only minimal improvement.  He needs further inpatient treatment.  Plan to continue IV steroids, antibiotics and schedule Atrovent treatments.  Continue supportive care.  Pt is not medically ready to discharge home.   2. Acute COPD exacerbation-treating as above.   3. Paroxysmal atrial fibrillation- patient says that he cannot tolerate albuterol nebulizer treatments therefore we are using Atrovent treatments.  According to cardiology records that I reviewed the patient has no interest in full anticoagulation at this time.  His heart rate remains controlled.  Follow.  4. Leukocytosis-His WBC is coming down with treatment.  Follow CBC with differential. 5. Fever-Tylenol ordered as needed.  Treating infection as above.  Follow vitals.  DVT Prophylaxis: Lovenox Code Status: Full Family Communication: Updated at bedside Disposition Plan: Inpatient for IV steroids and respiratory support  Consultants:    Procedures:    Antimicrobials:  Doxycycline 11/27>  Subjective: Pt says that he continues to be short of breath but the nebulizer treatments have been helping some.  He is not able to ambulate much due to shortness of breath.   Objective: Vitals:   08/11/18 2100 08/12/18 0710 08/12/18 0737 08/12/18 0745  BP: 112/71 137/80    Pulse: (!) 103 95    Resp: 18 18    Temp: 98.4 F (36.9 C) 98 F (36.7 C)    TempSrc: Oral Oral    SpO2: 99% 97% 96% 98%  Weight:      Height:        Intake/Output Summary  (Last 24 hours) at 08/12/2018 1018 Last data filed at 08/11/2018 1123 Gross per 24 hour  Intake 354.76 ml  Output -  Net 354.76 ml   Filed Weights   08/11/18 0820  Weight: 75.8 kg   REVIEW OF SYSTEMS  As per history otherwise all reviewed and reported negative  Exam:  General exam: elderly chronically ill appearing male, mild distress. Cooperative.  Respiratory system: bilateral breath sounds clear with bibasilar expiratory wheezes heard. No increased work of breathing. Cardiovascular system: S1 & S2 heard, RRR. No JVD, murmurs, gallops, clicks or pedal edema. Gastrointestinal system: Abdomen is nondistended, soft and nontender. Normal bowel sounds heard. Central nervous system: Alert and oriented. No focal neurological deficits. Extremities: no cyanosis or clubbing.  Data Reviewed: Basic Metabolic Panel: Recent Labs  Lab 08/11/18 0840 08/12/18 0418  NA 137 140  K 3.7 3.6  CL 102 109  CO2 22 22  GLUCOSE 116* 178*  BUN 24* 24*  CREATININE 1.23 0.94  CALCIUM 9.4 9.0  MG  --  2.3   Liver Function Tests: Recent Labs  Lab 08/11/18 0840  AST 18  ALT 16  ALKPHOS 61  BILITOT 2.4*  PROT 7.7  ALBUMIN 4.0   No results for input(s): LIPASE, AMYLASE in the last 168 hours. No results for input(s): AMMONIA in the last 168 hours. CBC: Recent Labs  Lab 08/11/18 0840 08/12/18 0418  WBC 21.9* 13.0*  NEUTROABS 17.0* 11.7*  HGB 16.9 14.5  HCT 50.0 42.5  MCV 89.6 90.0  PLT 250  227   Cardiac Enzymes: No results for input(s): CKTOTAL, CKMB, CKMBINDEX, TROPONINI in the last 168 hours. CBG (last 3)  No results for input(s): GLUCAP in the last 72 hours. Recent Results (from the past 240 hour(s))  Culture, blood (Routine x 2)     Status: None (Preliminary result)   Collection Time: 08/11/18  8:40 AM  Result Value Ref Range Status   Specimen Description RIGHT ANTECUBITAL  Final   Special Requests   Final    BOTTLES DRAWN AEROBIC AND ANAEROBIC Blood Culture adequate  volume   Culture   Final    NO GROWTH < 24 HOURS Performed at [facility], [address] [place], [place] [address]    Report Status PENDING  Incomplete  Culture, blood (Routine x 2)     Status: None (Preliminary result)   Collection Time: 08/11/18  8:47 AM  Result Value Ref Range Status   Specimen Description BLOOD LEFT FOREARM  Final   Special Requests   Final    BOTTLES DRAWN AEROBIC AND ANAEROBIC Blood Culture adequate volume   Culture   Final    NO GROWTH < 24 HOURS Performed at [facility], [address], [place], [place] [address]    Report Status PENDING  Incomplete     Studies: Dg Chest 2 View  Result Date: 08/11/2018 CLINICAL DATA:  Shortness of breath and cough EXAM: CHEST - 2 VIEW COMPARISON:  April 22, 2018 FINDINGS: Lungs remain mildly hyperexpanded with prominence of the retrosternal clear space, findings indicative of emphysematous change, stable. There are scattered areas of scarring bilaterally with diminished vascularity in the upper lobes, a stable finding again consistent with underlying emphysematous change. There is no edema or consolidation. Heart size and pulmonary vascularity normal. No adenopathy. There is aortic atherosclerosis. No adenopathy. No bone lesions. IMPRESSION: Stable underlying emphysematous change with areas of mild scarring. Aortic atherosclerosis noted. No edema or consolidation. Stable cardiac silhouette. Aortic Atherosclerosis (ICD10-I70.0) and Emphysema (ICD10-J43.9). Electronically Signed   By: Lowella Grip III M.D.   On: 08/11/2018 09:35   Scheduled Meds: . aspirin EC  81 mg Oral Q M,W,F  . diltiazem  120 mg Oral QHS  . diltiazem  240 mg Oral q morning - 10a  . doxycycline  100 mg Oral Q12H  . enoxaparin (LOVENOX) injection  40 mg Subcutaneous Q24H  . ipratropium  0.5 mg Nebulization Q6H  . levalbuterol  1.25 mg Nebulization Q6H  . methylPREDNISolone (SOLU-MEDROL) injection  60 mg Intravenous Q6H  . mometasone-formoterol  2  puff Inhalation BID  . pantoprazole  40 mg Oral Q0600  . pregabalin  150 mg Oral Q8H  . sodium chloride flush  3 mL Intravenous Q12H   Continuous Infusions: . sodium chloride    . azithromycin 500 mg (08/12/18 0901)    Principal Problem:   COPD with acute exacerbation (HCC) Active Problems:   ATRIAL FIBRILLATION, PAROXYSMAL   Personal history of prostate cancer   Mixed hyperlipidemia   Neuropathy   Emphysema of lung ([place])   Abdominal aortic aneurysm (AAA) without rupture (HCC)   Orthostatic hypotension   Leukocytosis   Protein-calorie malnutrition, severe   Wears partial dentures  Time spent:   Irwin Brakeman, MD, FAAFP [facility] Pager [phone]  If 7PM-7AM, please contact night-coverage [email or web address] Password [email or web address] 08/12/2018, 10:18 AM    LOS: 1 day

## 2018-08-13 LAB — CBC WITH DIFFERENTIAL/PLATELET
ABS IMMATURE GRANULOCYTES: 0.09 10*3/uL — AB (ref 0.00–0.07)
BASOS ABS: 0 10*3/uL (ref 0.0–0.1)
BASOS PCT: 0 %
Eosinophils Absolute: 0 10*3/uL (ref 0.0–0.5)
Eosinophils Relative: 0 %
HCT: 41.7 % (ref 39.0–52.0)
HEMOGLOBIN: 14.1 g/dL (ref 13.0–17.0)
IMMATURE GRANULOCYTES: 1 %
LYMPHS PCT: 3 %
Lymphs Abs: 0.6 10*3/uL — ABNORMAL LOW (ref 0.7–4.0)
MCH: 30.9 pg (ref 26.0–34.0)
MCHC: 33.8 g/dL (ref 30.0–36.0)
MCV: 91.2 fL (ref 80.0–100.0)
Monocytes Absolute: 0.7 10*3/uL (ref 0.1–1.0)
Monocytes Relative: 4 %
NEUTROS ABS: 17 10*3/uL — AB (ref 1.7–7.7)
NEUTROS PCT: 92 %
NRBC: 0 % (ref 0.0–0.2)
PLATELETS: 260 10*3/uL (ref 150–400)
RBC: 4.57 MIL/uL (ref 4.22–5.81)
RDW: 12.7 % (ref 11.5–15.5)
WBC: 18.4 10*3/uL — AB (ref 4.0–10.5)

## 2018-08-13 LAB — BASIC METABOLIC PANEL
ANION GAP: 8 (ref 5–15)
BUN: 30 mg/dL — AB (ref 8–23)
CO2: 24 mmol/L (ref 22–32)
Calcium: 9.2 mg/dL (ref 8.9–10.3)
Chloride: 110 mmol/L (ref 98–111)
Creatinine, Ser: 1.05 mg/dL (ref 0.61–1.24)
Glucose, Bld: 202 mg/dL — ABNORMAL HIGH (ref 70–99)
Potassium: 3.1 mmol/L — ABNORMAL LOW (ref 3.5–5.1)
SODIUM: 142 mmol/L (ref 135–145)

## 2018-08-13 MED ORDER — POTASSIUM CHLORIDE CRYS ER 20 MEQ PO TBCR
40.0000 meq | EXTENDED_RELEASE_TABLET | Freq: Once | ORAL | Status: AC
  Administered 2018-08-13: 40 meq via ORAL
  Filled 2018-08-13: qty 2

## 2018-08-13 MED ORDER — POLYVINYL ALCOHOL 1.4 % OP SOLN
2.0000 [drp] | OPHTHALMIC | Status: DC | PRN
  Administered 2018-08-13: 2 [drp] via OPHTHALMIC
  Filled 2018-08-13: qty 15

## 2018-08-13 MED ORDER — GUAIFENESIN ER 600 MG PO TB12
1200.0000 mg | ORAL_TABLET | Freq: Two times a day (BID) | ORAL | Status: DC
  Administered 2018-08-13 – 2018-08-14 (×2): 1200 mg via ORAL
  Filled 2018-08-13 (×2): qty 2

## 2018-08-13 NOTE — Progress Notes (Signed)
PROGRESS NOTE  [name] [name]  GBT:[id number]  DOB: 09/19/1940  DOA: 08/11/2018 PCP: [name], MD  Brief Admission Hx: 77 y.o. male with oxygen dependent, prednisone dependent COPD apparently has been having upper respiratory symptoms at home for the past several days and his symptoms have progressed. He was admitted with an acute COPD exacerbation.   MDM/Assessment & Plan: 1. Acute respiratory failure with hypoxia-secondary to acute COPD exacerbation.  He remains SOB with only minimal improvement.  He continues to need inpatient treatment.  Plan to continue IV steroids, antibiotics and schedule Atrovent treatments.  Continue supportive care.     2. Acute COPD exacerbation-treating as above.   3. Paroxysmal atrial fibrillation- patient says that he cannot tolerate albuterol nebulizer treatments therefore we are using Atrovent treatments.  According to cardiology records that I reviewed the patient has no interest in full anticoagulation at this time.  His heart rate remains controlled on home diltiazem.   Follow.  4. Leukocytosis- Following CBC with differential. 5. Fever-Tylenol ordered as needed.  Treating infection as above.  Follow vitals.  DVT Prophylaxis: Lovenox Code Status: Full Family Communication: Updated at bedside Disposition Plan: Inpatient for IV steroids and respiratory support  Consultants:    Procedures:    Antimicrobials:  Doxycycline 11/27 >  Subjective: Pt says that he continues to be short of breath with only a little improvement.  He is asking about getting a CT scan of his lungs.   Objective: Vitals:   08/12/18 2120 08/13/18 0246 08/13/18 0626 08/13/18 0733  BP: 135/65  137/78   Pulse: (!) 113  (!) 106   Resp:      Temp: 98.1 F (36.7 C)  98.2 F (36.8 C)   TempSrc: Oral  Oral   SpO2: 98% 97% 99% 98%  Weight:      Height:        Intake/Output Summary (Last 24 hours) at 08/13/2018 1101 Last data filed at 08/13/2018 0900 Gross per 24  hour  Intake 1320 ml  Output -  Net 1320 ml   Filed Weights   08/11/18 0820  Weight: 75.8 kg   REVIEW OF SYSTEMS  As per history otherwise all reviewed and reported negative  Exam:  General exam: elderly chronically ill appearing male, no apparent distress. Cooperative.  Respiratory system: BBS with bibasilar expiratory wheezes heard. No increased work of breathing. Cardiovascular system: S1 & S2 heard, tachycardic. No JVD, murmurs, gallops, clicks or pedal edema. Gastrointestinal system: Abdomen is nondistended, soft and nontender. Normal bowel sounds heard. Central nervous system: Alert and oriented. No focal neurological deficits. Extremities: no cyanosis or clubbing.  Data Reviewed: Basic Metabolic Panel: Recent Labs  Lab 08/11/18 0840 08/12/18 0418 08/13/18 0456  NA 137 140 142  K 3.7 3.6 3.1*  CL 102 109 110  CO2 22 22 24   GLUCOSE 116* 178* 202*  BUN 24* 24* 30*  CREATININE 1.23 0.94 1.05  CALCIUM 9.4 9.0 9.2  MG  --  2.3  --    Liver Function Tests: Recent Labs  Lab 08/11/18 0840  AST 18  ALT 16  ALKPHOS 61  BILITOT 2.4*  PROT 7.7  ALBUMIN 4.0   No results for input(s): LIPASE, AMYLASE in the last 168 hours. No results for input(s): AMMONIA in the last 168 hours. CBC: Recent Labs  Lab 08/11/18 0840 08/12/18 0418 08/13/18 0456  WBC 21.9* 13.0* 18.4*  NEUTROABS 17.0* 11.7* 17.0*  HGB 16.9 14.5 14.1  HCT 50.0 42.5 41.7  MCV  89.6 90.0 91.2  PLT 250 227 260   Cardiac Enzymes: No results for input(s): CKTOTAL, CKMB, CKMBINDEX, TROPONINI in the last 168 hours. CBG (last 3)  No results for input(s): GLUCAP in the last 72 hours. Recent Results (from the past 240 hour(s))  Urine culture     Status: Abnormal   Collection Time: 08/11/18  8:23 AM  Result Value Ref Range Status   Specimen Description   Final    URINE, CLEAN CATCH Performed at [facility], [address], [place], [place] [address]    Special Requests   Final    NONE Performed at  [facility], [address], [place], [place] [address]    Culture (A)  Final    <10,000 COLONIES/mL INSIGNIFICANT GROWTH Performed at [facility] [address] [place], [place] [address]    Report Status 08/12/2018 FINAL  Final  Culture, blood (Routine x 2)     Status: None (Preliminary result)   Collection Time: 08/11/18  8:40 AM  Result Value Ref Range Status   Specimen Description RIGHT ANTECUBITAL  Final   Special Requests   Final    BOTTLES DRAWN AEROBIC AND ANAEROBIC Blood Culture adequate volume   Culture   Final    NO GROWTH 2 DAYS Performed at [facility], [address], [place], [place] [address]    Report Status PENDING  Incomplete  Culture, blood (Routine x 2)     Status: None (Preliminary result)   Collection Time: 08/11/18  8:47 AM  Result Value Ref Range Status   Specimen Description BLOOD LEFT FOREARM  Final   Special Requests   Final    BOTTLES DRAWN AEROBIC AND ANAEROBIC Blood Culture adequate volume   Culture   Final    NO GROWTH 2 DAYS Performed at [facility], [address] [place],  [address]    Report Status PENDING  Incomplete    Studies: No results found. Scheduled Meds: . aspirin EC  81 mg Oral Q M,W,F  . diltiazem  120 mg Oral QHS  . diltiazem  240 mg Oral q morning - 10a  . doxycycline  100 mg Oral Q12H  . enoxaparin (LOVENOX) injection  40 mg Subcutaneous Q24H  . guaiFENesin  1,200 mg Oral BID  . ipratropium  0.5 mg Nebulization Q6H  . levalbuterol  1.25 mg Nebulization Q6H  . methylPREDNISolone (SOLU-MEDROL) injection  60 mg Intravenous Q6H  . mometasone-formoterol  2 puff Inhalation BID  . pantoprazole  40 mg Oral Q0600  . potassium chloride  40 mEq Oral Once  . pregabalin  150 mg Oral Q8H  . sodium chloride flush  3 mL Intravenous Q12H   Continuous Infusions: . sodium chloride     Principal Problem:   COPD with acute exacerbation (HCC) Active Problems:   ATRIAL FIBRILLATION, PAROXYSMAL   Personal history of  prostate cancer   Mixed hyperlipidemia   Neuropathy   Emphysema of lung (HCC)   Abdominal aortic aneurysm (AAA) without rupture (HCC)   Orthostatic hypotension   Leukocytosis   Protein-calorie malnutrition, severe   Wears partial dentures  Time spent:   Irwin Brakeman, MD, FAAFP [facility] Pager [phone]  If 7PM-7AM, please contact night-coverage [email or web address] Password [email or web address] 08/13/2018, 11:01 AM    LOS: 2 days

## 2018-08-14 LAB — BASIC METABOLIC PANEL
ANION GAP: 7 (ref 5–15)
BUN: 32 mg/dL — ABNORMAL HIGH (ref 8–23)
CHLORIDE: 112 mmol/L — AB (ref 98–111)
CO2: 24 mmol/L (ref 22–32)
Calcium: 9.1 mg/dL (ref 8.9–10.3)
Creatinine, Ser: 0.99 mg/dL (ref 0.61–1.24)
GFR calc Af Amer: >60 mL/min (ref >60)
GLUCOSE: 161 mg/dL — AB (ref 70–99)
POTASSIUM: 3.7 mmol/L (ref 3.5–5.1)
Sodium: 143 mmol/L (ref 135–145)

## 2018-08-14 LAB — CBC WITH DIFFERENTIAL/PLATELET
Abs Immature Granulocytes: 0.11 10*3/uL — ABNORMAL HIGH (ref 0.00–0.07)
Basophils Absolute: 0 10*3/uL (ref 0.0–0.1)
Basophils Relative: 0 %
Eosinophils Absolute: 0 10*3/uL (ref 0.0–0.5)
Eosinophils Relative: 0 %
HEMATOCRIT: 40.4 % (ref 39.0–52.0)
HEMOGLOBIN: 13.7 g/dL (ref 13.0–17.0)
IMMATURE GRANULOCYTES: 1 %
LYMPHS ABS: 0.8 10*3/uL (ref 0.7–4.0)
Lymphocytes Relative: 5 %
MCH: 30.7 pg (ref 26.0–34.0)
MCHC: 33.9 g/dL (ref 30.0–36.0)
MCV: 90.6 fL (ref 80.0–100.0)
MONOS PCT: 3 %
Monocytes Absolute: 0.5 10*3/uL (ref 0.1–1.0)
NEUTROS ABS: 13.6 10*3/uL — AB (ref 1.7–7.7)
NEUTROS PCT: 91 %
Platelets: 253 10*3/uL (ref 150–400)
RBC: 4.46 MIL/uL (ref 4.22–5.81)
RDW: 12.9 % (ref 11.5–15.5)
WBC: 15.1 10*3/uL — ABNORMAL HIGH (ref 4.0–10.5)
nRBC: 0 % (ref 0.0–0.2)

## 2018-08-14 LAB — MAGNESIUM: Magnesium: 2.3 mg/dL (ref 1.7–2.4)

## 2018-08-14 MED ORDER — PREDNISONE 10 MG PO TABS: ORAL_TABLET | ORAL | 5 refills | Status: DC

## 2018-08-14 MED ORDER — PANTOPRAZOLE SODIUM 40 MG PO TBEC: 40.0000 mg | DELAYED_RELEASE_TABLET | Freq: Every day | ORAL | 0 refills | Status: DC

## 2018-08-14 MED ORDER — MOMETASONE FURO-FORMOTEROL FUM 200-5 MCG/ACT IN AERO: 2.0000 | INHALATION_SPRAY | Freq: Two times a day (BID) | RESPIRATORY_TRACT | 0 refills | Status: DC

## 2018-08-14 MED ORDER — DOXYCYCLINE HYCLATE 100 MG PO TABS: 100.0000 mg | ORAL_TABLET | Freq: Two times a day (BID) | ORAL | 0 refills | Status: DC

## 2018-08-14 MED ORDER — GUAIFENESIN ER 600 MG PO TB12: 600.0000 mg | ORAL_TABLET | Freq: Two times a day (BID) | ORAL | 0 refills | Status: AC

## 2018-08-14 NOTE — Progress Notes (Signed)
IV removed and discharge instructions reviewed.  Wife to drive home 

## 2018-08-14 NOTE — Discharge Summary (Signed)
Physician Discharge Summary  [name] [name] GHW:[id number] DOB: 10/02/1940 DOA: 08/11/2018  PCP: [name], MD  Admit date: 08/11/2018 Discharge date: 08/14/2018  Admitted From: Home Disposition: Home  Recommendations for Outpatient Follow-up:  1. Follow up with PCP in 1-2 weeks 2. Please obtain BMP/CBC in one week  Home Health:  Equipment/Devices:  Discharge Condition: Stable CODE STATUS: Full code Diet recommendation: Heart healthy  Brief/Interim Summary: 77 year old male with oxygen dependent, prednisone dependent COPD, who was admitted to the hospital with worsening upper respiratory tract symptoms, shortness of breath, cough and wheezing.  He was found to have COPD exacerbation.  Patient was treated with intravenous steroids, antibiotics and bronchodilators.  His overall condition has improved.  He is now down to room air and is able to ambulate without becoming significantly hypoxic.  The patient does report that he uses oxygen at night and during the day as needed.  He has been advised to continue on this regimen.  He will be continued on a tapering dose of prednisone back to his home dose of 10 mg daily.  He will complete a course of antibiotics.  He already has nebulizer treatments at home.  In terms of maintenance therapy, he reports that Symbicort and Trelegy have been discontinued at home and he has been using Dulera with good effect.  This will be continued on discharge.  He does have a history of atrial fibrillation, paroxysmal and his heart rate remained controlled through his hospital stay.  He is continued on diltiazem.  The remainder of his medical problems are stable and he feels ready for discharge home today.  Discharge Diagnoses:  Principal Problem:   COPD with acute exacerbation ([place]) Active Problems:   ATRIAL FIBRILLATION, PAROXYSMAL   Personal history of prostate cancer   Mixed hyperlipidemia   Neuropathy   Emphysema of lung (HCC)   Abdominal aortic aneurysm  (AAA) without rupture (HCC)   Orthostatic hypotension   Leukocytosis   Protein-calorie malnutrition, severe   Wears partial dentures    Discharge Instructions  Discharge Instructions    Diet - low sodium heart healthy   Complete by:  As directed    Increase activity slowly   Complete by:  As directed      Allergies as of 08/14/2018      Reactions   Albuterol Other (See Comments)   Continuous nebulization caused extreme tachycardia. May use the inhaler.      Medication List    STOP taking these medications   SYMBICORT 160-4.5 MCG/ACT inhaler Generic drug:  budesonide-formoterol Replaced by:  mometasone-formoterol 200-5 MCG/ACT Aero   TRELEGY ELLIPTA 100-62.5-25 MCG/INH Aepb Generic drug:  Fluticasone-Umeclidin-Vilant     TAKE these medications   albuterol 108 (90 Base) MCG/ACT inhaler Commonly known as:  PROVENTIL HFA;VENTOLIN HFA Inhale 1 puff into the lungs every 6 (six) hours as needed for wheezing or shortness of breath.   albuterol (2.5 MG/3ML) 0.083% nebulizer solution Commonly known as:  PROVENTIL USE 1 VIAL IN NEBULIZER EVERY 6 HOURS AS NEEDED FOR WHEEZING OR SHORTNESS OF BREATH   aspirin EC 81 MG tablet Take 1 tablet (81 mg total) by mouth every other day. What changed:  when to take this   diltiazem 120 MG 24 hr capsule Commonly known as:  DILACOR XR TAKE 2 CAPSULES BY MOUTH IN THE MORNING AND 1 IN THE EVENING   doxycycline 100 MG tablet Commonly known as:  VIBRA-TABS Take 1 tablet (100 mg total) by mouth every 12 (twelve) hours.  guaiFENesin 600 MG 12 hr tablet Commonly known as:  MUCINEX Take 1 tablet (600 mg total) by mouth 2 (two) times daily.   LYRICA 150 MG capsule Generic drug:  pregabalin TAKE ONE CAPSULE BY MOUTH THREE TIMES DAILY What changed:    how much to take  when to take this   mometasone-formoterol 200-5 MCG/ACT Aero Commonly known as:  DULERA Inhale 2 puffs into the lungs 2 (two) times daily. Replaces:  SYMBICORT  160-4.5 MCG/ACT inhaler   nitroGLYCERIN 0.4 MG SL tablet Commonly known as:  NITROSTAT Place 1 tablet (0.4 mg total) under the tongue every 5 (five) minutes as needed for chest pain.   Oxycodone HCl 10 MG Tabs Take 10 mg by mouth 4 (four) times daily. Take 1 tablet every 6 hours   pantoprazole 40 MG tablet Commonly known as:  PROTONIX Take 1 tablet (40 mg total) by mouth daily at 6 (six) AM. Start taking on:  08/15/2018   predniSONE 10 MG tablet Commonly known as:  DELTASONE Take 40mg  po daily for 2 days then 30mg  daily for 2 days then 20mg  daily for 2 days then 10mg  daily What changed:    how much to take  how to take this  when to take this  additional instructions       Allergies  Allergen Reactions  . Albuterol Other (See Comments)    Continuous nebulization caused extreme tachycardia. May use the inhaler.    Consultations:     Procedures/Studies: Dg Chest 2 View  Result Date: 08/11/2018 CLINICAL DATA:  Shortness of breath and cough EXAM: CHEST - 2 VIEW COMPARISON:  April 22, 2018 FINDINGS: Lungs remain mildly hyperexpanded with prominence of the retrosternal clear space, findings indicative of emphysematous change, stable. There are scattered areas of scarring bilaterally with diminished vascularity in the upper lobes, a stable finding again consistent with underlying emphysematous change. There is no edema or consolidation. Heart size and pulmonary vascularity normal. No adenopathy. There is aortic atherosclerosis. No adenopathy. No bone lesions. IMPRESSION: Stable underlying emphysematous change with areas of mild scarring. Aortic atherosclerosis noted. No edema or consolidation. Stable cardiac silhouette. Aortic Atherosclerosis (ICD10-I70.0) and Emphysema (ICD10-J43.9). Electronically Signed   By: Lowella Grip III M.D.   On: 08/11/2018 09:35       Subjective: Feeling better today.  Shortness of breath improving.  Able to ambulate without supplemental  oxygen.  Wheezing is better.  Discharge Exam: Vitals:   08/14/18 0130 08/14/18 0600 08/14/18 0831 08/14/18 1350  BP:  (!) 147/77    Pulse:  98  (!) 117  Resp:  19  18  Temp:  99.2 F (37.3 C)    TempSrc:  Oral    SpO2: 96% 96% 98% 91%  Weight:      Height:        General: Pt is alert, awake, not in acute distress Cardiovascular: RRR, S1/S2 +, no rubs, no gallops Respiratory: CTA bilaterally, no wheezing, no rhonchi Abdominal: Soft, NT, ND, bowel sounds + Extremities: no edema, no cyanosis    The results of significant diagnostics from this hospitalization (including imaging, microbiology, ancillary and laboratory) are listed below for reference.     Microbiology: Recent Results (from the past 240 hour(s))  Urine culture     Status: Abnormal   Collection Time: 08/11/18  8:23 AM  Result Value Ref Range Status   Specimen Description   Final    URINE, CLEAN CATCH Performed at [facility], [address], [place], [name]  [address]    Special Requests   Final    NONE Performed at [facility], [address], [place], [place] [address]    Culture (A)  Final    <10,000 COLONIES/mL INSIGNIFICANT GROWTH Performed at [facility] [address] [place], [place] [address]    Report Status 08/12/2018 FINAL  Final  Culture, blood (Routine x 2)     Status: None (Preliminary result)   Collection Time: 08/11/18  8:40 AM  Result Value Ref Range Status   Specimen Description RIGHT ANTECUBITAL  Final   Special Requests   Final    BOTTLES DRAWN AEROBIC AND ANAEROBIC Blood Culture adequate volume   Culture   Final    NO GROWTH 3 DAYS Performed at [facility], [address], [place], [place] [address]    Report Status PENDING  Incomplete  Culture, blood (Routine x 2)     Status: None (Preliminary result)   Collection Time: 08/11/18  8:47 AM  Result Value Ref Range Status   Specimen Description BLOOD LEFT FOREARM  Final   Special Requests   Final    BOTTLES DRAWN  AEROBIC AND ANAEROBIC Blood Culture adequate volume   Culture   Final    NO GROWTH 3 DAYS Performed at [facility], [address], [place], [place] [address]    Report Status PENDING  Incomplete     Labs: BNP (last 3 results) Recent Labs    11/18/17 1930 11/25/17 2031  BNP 40.0 21.1   Basic Metabolic Panel: Recent Labs  Lab 08/11/18 0840 08/12/18 0418 08/13/18 0456 08/14/18 0639  NA 137 140 142 143  K 3.7 3.6 3.1* 3.7  CL 102 109 110 112*  CO2 22 22 24 24   GLUCOSE 116* 178* 202* 161*  BUN 24* 24* 30* 32*  CREATININE 1.23 0.94 1.05 0.99  CALCIUM 9.4 9.0 9.2 9.1  MG  --  2.3  --  2.3   Liver Function Tests: Recent Labs  Lab 08/11/18 0840  AST 18  ALT 16  ALKPHOS 61  BILITOT 2.4*  PROT 7.7  ALBUMIN 4.0   No results for input(s): LIPASE, AMYLASE in the last 168 hours. No results for input(s): AMMONIA in the last 168 hours. CBC: Recent Labs  Lab 08/11/18 0840 08/12/18 0418 08/13/18 0456 08/14/18 0639  WBC 21.9* 13.0* 18.4* 15.1*  NEUTROABS 17.0* 11.7* 17.0* 13.6*  HGB 16.9 14.5 14.1 13.7  HCT 50.0 42.5 41.7 40.4  MCV 89.6 90.0 91.2 90.6  PLT 250 227 260 253   Cardiac Enzymes: No results for input(s): CKTOTAL, CKMB, CKMBINDEX, TROPONINI in the last 168 hours. BNP: Invalid input(s): POCBNP CBG: No results for input(s): GLUCAP in the last 168 hours. D-Dimer No results for input(s): DDIMER in the last 72 hours. Hgb A1c No results for input(s): HGBA1C in the last 72 hours. Lipid Profile No results for input(s): CHOL, HDL, LDLCALC, TRIG, CHOLHDL, LDLDIRECT in the last 72 hours. Thyroid function studies No results for input(s): TSH, T4TOTAL, T3FREE, THYROIDAB in the last 72 hours.  Invalid input(s): FREET3 Anemia work up No results for input(s): VITAMINB12, FOLATE, FERRITIN, TIBC, IRON, RETICCTPCT in the last 72 hours. Urinalysis    Component Value Date/Time   COLORURINE YELLOW 08/11/2018 0823   APPEARANCEUR CLEAR 08/11/2018 0823   LABSPEC 1.025  08/11/2018 0823   PHURINE 5.0 08/11/2018 0823   GLUCOSEU NEGATIVE 08/11/2018 0823   HGBUR NEGATIVE 08/11/2018 0823   BILIRUBINUR NEGATIVE 08/11/2018 0823   KETONESUR 20 (A) 08/11/2018  [place] 08/11/2018 0823   NITRITE NEGATIVE 08/11/2018 0823   LEUKOCYTESUR NEGATIVE 08/11/2018 0823   Sepsis Labs Invalid input(s): PROCALCITONIN,  WBC,  LACTICIDVEN Microbiology Recent Results (from the past 240 hour(s))  Urine culture     Status: Abnormal   Collection Time: 08/11/18  8:23 AM  Result Value Ref Range Status   Specimen Description   Final    URINE, CLEAN CATCH Performed at [facility], [address] [place], [place] [address]    Special Requests   Final    NONE Performed at [facility], [address] [place], [place] [address]    Culture (A)  Final    <10,000 COLONIES/mL INSIGNIFICANT GROWTH Performed at [facility] [address], [place], [place] [address]    Report Status 08/12/2018 FINAL  Final  Culture, blood (Routine x 2)     Status: None (Preliminary result)   Collection Time: 08/11/18  8:40 AM  Result Value Ref Range Status   Specimen Description RIGHT ANTECUBITAL  Final   Special Requests   Final    BOTTLES DRAWN AEROBIC AND ANAEROBIC Blood Culture adequate volume   Culture   Final    NO GROWTH 3 DAYS Performed at [facility], [address], [place], [place] [address]    Report Status PENDING  Incomplete  Culture, blood (Routine x 2)     Status: None (Preliminary result)   Collection Time: 08/11/18  8:47 AM  Result Value Ref Range Status   Specimen Description BLOOD LEFT FOREARM  Final   Special Requests   Final    BOTTLES DRAWN AEROBIC AND ANAEROBIC Blood Culture adequate volume   Culture   Final    NO GROWTH 3 DAYS Performed at [facility], [address] [place], [place] [address]    Report Status PENDING  Incomplete     Time coordinating discharge: 3mins  SIGNED:   Kathie Dike, MD  [facility] 08/14/2018,  7:53 PM Pager   If 7PM-7AM, please contact night-coverage [email or web address] Password [email or web address]

## 2018-08-16 LAB — CULTURE, BLOOD (ROUTINE X 2)
CULTURE: NO GROWTH
Culture: NO GROWTH
Special Requests: ADEQUATE
Special Requests: ADEQUATE

## 2018-08-20 ENCOUNTER — Telehealth: Payer: Self-pay | Admitting: Emergency Medicine

## 2018-08-20 MED ORDER — PREDNISONE 10 MG PO TABS: ORAL_TABLET | ORAL | 0 refills | Status: DC

## 2018-08-20 NOTE — Telephone Encounter (Signed)
I think we need to extend his prednisone taper as below. Also please make sure he is taking his mucinex as prescribed.  If he is not better next week he needs to come in to be seen  Prednisone > Take 40mg  daily for 3 days, then 30mg  daily for 3 days, then 20mg  daily for 3 days, then 10mg  daily for 3 days, then stop

## 2018-08-20 NOTE — Telephone Encounter (Signed)
Call made to patient, made aware of RB recommendations. While on phone patient wanted to confirm his appointment, he feels like he needed to be seen sooner. Appointment moved up to 12.10.19. Confirmed pharmacy, prednisone sent it. Patient voiced understanding. Nothing further is needed at this time.

## 2018-08-20 NOTE — Telephone Encounter (Signed)
Called pt and spoke with wife [name] [name] who states pt has been coughing and trying to get phlegm up when he coughs but pt is unable to get any phlegm up. When I asked [name] [name] how long pt has had the cough, the only thing she could tell me is that pt has had it for awhile and it has become worse since he has been out of the hospital. Pt denies any fever.    Due to the cough, pt is having more problems with his O2 sats and pt has had to increase O2 to 3L.   I advised [name] [name] that we might need to get pt in for a sooner appt than 12/17 but pt stated to [facility] that he is still unable to walk and also due to him having to bump up his O2, pt states he cannot come in for an appt. Pt and [name] [name] want to know what is recommended.  Dr.Byrum, please advise on this for pt. Thanks!

## 2018-08-24 ENCOUNTER — Encounter: Payer: Self-pay | Admitting: Primary Care

## 2018-08-24 ENCOUNTER — Ambulatory Visit (INDEPENDENT_AMBULATORY_CARE_PROVIDER_SITE_OTHER): Payer: Medicare Other | Admitting: Primary Care

## 2018-08-24 ENCOUNTER — Ambulatory Visit (INDEPENDENT_AMBULATORY_CARE_PROVIDER_SITE_OTHER)
Admission: RE | Admit: 2018-08-24 | Discharge: 2018-08-24 | Disposition: A | Discharge status: Home / Self Care | Payer: Medicare Other | Source: Ambulatory Visit | Attending: Primary Care | Admitting: Primary Care

## 2018-08-24 VITALS — BP 134/76 | HR 109 | Temp 98.4°F | Ht 70.0 in | Wt 155.2 lb

## 2018-08-24 DIAGNOSIS — J441 Chronic obstructive pulmonary disease with (acute) exacerbation: Secondary | ICD-10-CM

## 2018-08-24 DIAGNOSIS — J449 Chronic obstructive pulmonary disease, unspecified: Secondary | ICD-10-CM | POA: Diagnosis not present

## 2018-08-24 MED ORDER — PREDNISONE 10 MG PO TABS: ORAL_TABLET | ORAL | 3 refills | Status: DC

## 2018-08-24 MED ORDER — IPRATROPIUM-ALBUTEROL 0.5-2.5 (3) MG/3ML IN SOLN: 3.0000 mL | Freq: Four times a day (QID) | RESPIRATORY_TRACT | 0 refills | Status: DC | PRN

## 2018-08-24 MED ORDER — LEVOFLOXACIN 500 MG PO TABS: 500.0000 mg | ORAL_TABLET | Freq: Every day | ORAL | 0 refills | Status: DC

## 2018-08-24 NOTE — Progress Notes (Signed)
@Patient  ID: [name] [name], male    DOB: [birth year], 77 y.o.   MRN: [id number]  Chief Complaint  Patient presents with  . Follow-up    worse than in hospital, SOB all the time especially with exertion, cough with chest rumble, nothing coming out, constant pressure in chest and between shoulder blades    Referring provider: [name], MD  HPI: 77 year old male, former smoker (80 pack year hx). PMH significant for COPD with frequent exacerbations (oxygen and prednisone dependent). Patient of Dr. Lamonte Sakai, last seen 04/20/18. Recent hospitalization for COPD exacerbation.  Hospital course 11/27-11/30: Admitted for worsening upper respiratory tract symptoms, shortness of breath, cough and wheezing. Treated with IV steroids, antibiotics and bronchodilators. Tapering prednisone. Using dulera with good effect, continued on discharge.   08/30/2018 Patient presents today for hospital follow-up visit. He was discharged home 10 days ago. Continues to have congested cough, unable to produce sputum. Reports that his cough is more frequent. He is taking mucinex and using flutter valve a couple times a day. Completed doxycyline course. Still on prednisone, currently taking 30mg . Continues using Albuterol nebulizer 4 times a day. Started on [place], only taking 2 puffs in am (stopped symbicort and trelegy). States that he's been on Spiriva respimat since 2001. Needs new spacer. Remains on 3L oxygen. Denies fever, chills, aspiration.    Allergies  Allergen Reactions  . Albuterol Other (See Comments)    Continuous nebulization caused extreme tachycardia. May use the inhaler.    Immunization History  Administered Date(s) Administered  . Influenza Split 06/15/2012  . Influenza Whole 06/21/2009, 06/15/2010, 06/17/2011  . Influenza, High Dose Seasonal PF 05/21/2016, 06/23/2017  . Influenza,inj,Quad PF,6+ Mos 05/18/2013, 05/15/2014, 10/29/2015  . Pneumococcal Conjugate-13 05/15/2014  . Pneumococcal  Polysaccharide-23 05/18/2008, 05/18/2013    Past Medical History:  Diagnosis Date  . AAA (abdominal aortic aneurysm) ([place])   . Asthma   . COPD with emphysema ([place])    Dr. Joya Gaskins, [place] stage C  . DJD (degenerative joint disease), lumbar   . Emphysema of lung ([place])   . History of prostate cancer    Status post radioactive seed implants  . History of shingles   . Incomplete emptying of bladder   . Mixed hyperlipidemia   . PAF (paroxysmal atrial fibrillation) ([place])   . Palpitations   . Peripheral neuropathy   . Prostate cancer ([place])   . Sciatic nerve pain    Secondary to shingles 2011  . Urethral stricture   . Wears partial dentures    Upper and lower    Tobacco History: Social History   Tobacco Use  Smoking Status Former Smoker  . Packs/day: 2.00  . Years: 40.00  . Pack years: 80.00  . Types: Cigarettes  . Start date: 06/19/1954  . Last attempt to quit: 09/15/1998  . Years since quitting: 19.9  Smokeless Tobacco Never Used  Tobacco Comment   Counseled to remain smoke free   Counseling given: Not Answered Comment: Counseled to remain smoke free   Outpatient Medications Prior to Visit  Medication Sig Dispense Refill  . albuterol (PROVENTIL HFA;VENTOLIN HFA) 108 (90 Base) MCG/ACT inhaler Inhale 1 puff into the lungs every 6 (six) hours as needed for wheezing or shortness of breath.    [place] Kitchen aspirin EC 81 MG tablet Take 1 tablet (81 mg total) by mouth every other day. (Patient taking differently: Take 81 mg by mouth every Monday, Wednesday, and Friday. ) 30 tablet 0  . diltiazem (DILT-XR) 120  MG 24 hr capsule TAKE 2 CAPSULES BY MOUTH IN THE MORNING AND 1 IN THE EVENING 270 capsule 3  . guaiFENesin (MUCINEX) 600 MG 12 hr tablet Take 1 tablet (600 mg total) by mouth 2 (two) times daily. 30 tablet 0  . LYRICA 150 MG capsule TAKE ONE CAPSULE BY MOUTH THREE TIMES DAILY (Patient taking differently: Take 150 mg by mouth every 8 (eight) hours. ) 90 capsule 0  . mometasone-formoterol  (DULERA) 200-5 MCG/ACT AERO Inhale 2 puffs into the lungs 2 (two) times daily. 1 Inhaler 0  . nitroGLYCERIN (NITROSTAT) 0.4 MG SL tablet Place 1 tablet (0.4 mg total) under the tongue every 5 (five) minutes as needed for chest pain. 30 tablet 0  . Oxycodone HCl 10 MG TABS Take 10 mg by mouth 4 (four) times daily. Take 1 tablet every 6 hours    . predniSONE (DELTASONE) 10 MG tablet Take 40mg  daily for 3 days, then 30mg  daily for 3 days, then 20mg  daily for 3 days, then 10mg  daily for 3 days, then STOP. 30 tablet 0  . albuterol (PROVENTIL) (2.5 MG/3ML) 0.083% nebulizer solution USE 1 VIAL IN NEBULIZER EVERY 6 HOURS AS NEEDED FOR WHEEZING OR SHORTNESS OF BREATH  12  . doxycycline (VIBRA-TABS) 100 MG tablet Take 1 tablet (100 mg total) by mouth every 12 (twelve) hours. 6 tablet 0  . pantoprazole (PROTONIX) 40 MG tablet Take 1 tablet (40 mg total) by mouth daily at 6 (six) AM. (Patient not taking: Reported on 08/24/2018) 30 tablet 0   No facility-administered medications prior to visit.     Review of Systems  Review of Systems  Constitutional: Negative.   HENT: Positive for congestion.   Respiratory: Positive for cough, shortness of breath and wheezing.   Cardiovascular: Negative.   Skin: Negative.     Physical Exam  BP 134/76 (BP Location: Left Arm, Cuff Size: Normal)   Pulse (!) 109   Temp 98.4 F (36.9 C)   Ht 5\' 10"  (1.778 m)   Wt 155 lb 3.2 oz (70.4 kg)   SpO2 96%   BMI 22.27 kg/m  Physical Exam Constitutional:      General: He is not in acute distress.    Appearance: He is well-developed and well-nourished.     Comments: Slender adult male  HENT:     Head: Normocephalic and atraumatic.  Eyes:     Extraocular Movements: EOM normal.     Pupils: Pupils are equal, round, and reactive to light.  Neck:     Musculoskeletal: Normal range of motion and neck supple.  Cardiovascular:     Rate and Rhythm: Normal rate and regular rhythm.  Pulmonary:     Effort: Pulmonary effort is  normal. No respiratory distress.     Breath sounds: Wheezing and rales present.     Comments: Rhonchi t/o with wheeze 96% 3L Musculoskeletal: Normal range of motion.  Skin:    General: Skin is warm.  Neurological:     Mental Status: He is alert and oriented to person, place, and time.  Psychiatric:        Mood and Affect: Mood and affect normal.        Behavior: Behavior normal.        Thought Content: Thought content normal.        Judgment: Judgment normal.      Lab Results:  CBC    Component Value Date/Time   WBC 15.1 (H) 08/14/2018 0639   RBC 4.46 08/14/2018 1194  HGB 13.7 08/14/2018 0639   HCT 40.4 08/14/2018 0639   PLT 253 08/14/2018 0639   MCV 90.6 08/14/2018 0639   MCH 30.7 08/14/2018 0639   MCHC 33.9 08/14/2018 0639   RDW 12.9 08/14/2018 0639   LYMPHSABS 0.8 08/14/2018 0639   MONOABS 0.5 08/14/2018 0639   EOSABS 0.0 08/14/2018 0639   BASOSABS 0.0 08/14/2018 0639    BMET    Component Value Date/Time   NA 143 08/14/2018 0639   K 3.7 08/14/2018 0639   CL 112 (H) 08/14/2018 0639   CO2 24 08/14/2018 0639   GLUCOSE 161 (H) 08/14/2018 0639   BUN 32 (H) 08/14/2018 0639   CREATININE 0.99 08/14/2018 0639   CREATININE 1.00 10/27/2017 1435   CALCIUM 9.1 08/14/2018 0639   GFRNONAA >60 08/14/2018 0639   GFRNONAA 73 10/27/2017 1435   GFRAA >60 08/14/2018 0639   GFRAA 84 10/27/2017 1435    BNP    Component Value Date/Time   BNP 59.0 11/25/2017 2031    ProBNP    Component Value Date/Time   PROBNP 33.7 06/25/2012 1026    Imaging: Dg Chest 2 View  Result Date: 08/24/2018 CLINICAL DATA:  Severe COPD with cough and short of breath. EXAM: CHEST - 2 VIEW COMPARISON:  08/11/2018 FINDINGS: Severe pulmonary hyperinflation. Lungs are clear without acute infiltrate effusion or mass. Negative for heart failure. IMPRESSION: Severe pulmonary hyperinflation without acute abnormality. Electronically Signed   By: Franchot Gallo M.D.   On: 08/24/2018 16:14   Dg Chest 2  View  Result Date: 08/11/2018 CLINICAL DATA:  Shortness of breath and cough EXAM: CHEST - 2 VIEW COMPARISON:  April 22, 2018 FINDINGS: Lungs remain mildly hyperexpanded with prominence of the retrosternal clear space, findings indicative of emphysematous change, stable. There are scattered areas of scarring bilaterally with diminished vascularity in the upper lobes, a stable finding again consistent with underlying emphysematous change. There is no edema or consolidation. Heart size and pulmonary vascularity normal. No adenopathy. There is aortic atherosclerosis. No adenopathy. No bone lesions. IMPRESSION: Stable underlying emphysematous change with areas of mild scarring. Aortic atherosclerosis noted. No edema or consolidation. Stable cardiac silhouette. Aortic Atherosclerosis (ICD10-I70.0) and Emphysema (ICD10-J43.9). Electronically Signed   By: Lowella Grip III M.D.   On: 08/11/2018 09:35     Assessment & Plan:   COPD with acute exacerbation (HCC) Prolonged COPD exacerbation RX Levaquin 500mg  qd x 7 days Continues prednisone taper to 10mg  daily CXR showed hyperinflation (CO2 was 24 on most recent BMET 11/30)  Change albuterol nebulizer to duonebs q6-8 hours  Continue Dulera 200 BID and spiriva respimat  Recommend mucinex twice daily and flutter valve for congestion  FU in 1 month with Dr. Gigi Gin, NP 08/30/2018

## 2018-08-24 NOTE — Patient Instructions (Addendum)
Chest x-ray today  Start Levaquin as prescribed   After complete prednisone taper please continue 10 mg prednisone daily  Continue Mucinex twice daily  Use flutter valve as much as possible during the day  Please take Dulera 2 puffs twice daily Continue Spiriva Respimat daily  Change nebulizer to DuoNeb every 4-6 hours as needed for shortness of breath or wheezing If develops urinary retention please letter office know and discontinue duoneb use   Okay to fax our office paperwork to change POC today [facility]   Please call our office and let us know how you are doing in 5 days Please return if not better, present to emergency room if shortness of breath worsens in any way  Follow-up with Dr. Lamonte Sakai in 1 month

## 2018-08-26 ENCOUNTER — Telehealth: Payer: Self-pay | Admitting: Primary Care

## 2018-08-26 MED ORDER — IPRATROPIUM-ALBUTEROL 0.5-2.5 (3) MG/3ML IN SOLN: 3.0000 mL | Freq: Four times a day (QID) | RESPIRATORY_TRACT | 0 refills | Status: DC | PRN

## 2018-08-26 NOTE — Telephone Encounter (Signed)
New rx was sent with dx code J44.9

## 2018-08-30 ENCOUNTER — Encounter: Payer: Self-pay | Admitting: Primary Care

## 2018-08-30 MED ORDER — IPRATROPIUM-ALBUTEROL 0.5-2.5 (3) MG/3ML IN SOLN: 3.0000 mL | Freq: Four times a day (QID) | RESPIRATORY_TRACT | 1 refills | Status: DC | PRN

## 2018-08-30 NOTE — Assessment & Plan Note (Addendum)
Prolonged COPD exacerbation RX Levaquin 500mg  qd x 7 days Continues prednisone taper to 10mg  daily CXR showed hyperinflation (CO2 was 24 on most recent BMET 11/30)  Change albuterol nebulizer to duonebs q6-8 hours  Continue Dulera 200 BID and spiriva respimat  Recommend mucinex twice daily and flutter valve for congestion  FU in 1 month with Dr. Lamonte Sakai

## 2018-08-31 ENCOUNTER — Inpatient Hospital Stay: Payer: Medicare Other | Admitting: Primary Care

## 2018-09-21 ENCOUNTER — Telehealth: Payer: Self-pay | Admitting: Emergency Medicine

## 2018-09-21 NOTE — Telephone Encounter (Signed)
[name] [name], with [facility]. Unable to reach left message to give [place] a call back. Called patient, unable to reach and no option to leave voicemail as phone kept ringing.

## 2018-09-21 NOTE — Telephone Encounter (Signed)
Shannon/[facility] returned call, CB is [phone].

## 2018-09-21 NOTE — Telephone Encounter (Signed)
Called and spoke with Larene Beach, she stated that the patient was given mucomyst in the hospital and it works best for him. They would like for this to be called in. RB please advise, thank you.

## 2018-09-22 MED ORDER — ACETYLCYSTEINE 10 % IN SOLN: 2.0000 mL | Freq: Two times a day (BID) | RESPIRATORY_TRACT | 5 refills | Status: DC

## 2018-09-22 NOTE — Telephone Encounter (Signed)
Thanks - I'm Ok leaving it in the current 15 min slot w me

## 2018-09-22 NOTE — Telephone Encounter (Signed)
Acetylcysteinem sent to [facility] in [place]. Call made to patient to make aware. Call made to [facility] at [facility] to make aware.   RB please advise patient has a appt with you 01/15 at 130 but it is a f/u in a 15 minute slot. I called the patient to have it rescheduled in a 30 minute slot however patient requested to keep his appt with you as he would rather see you than NP. If you would like me to reschedule him in a 30 minute slot it will push his appointment out to the end of January.

## 2018-09-22 NOTE — Telephone Encounter (Signed)
Ok will leave appt as is

## 2018-09-22 NOTE — Telephone Encounter (Signed)
Okay to call in acetylcysteine 10% solution, 2 cc nebulized twice a day.  He can use this until we see him in follow-up  Please set him up with APP or Dr. Lamonte Sakai for a hospital follow-up visit if not already planned

## 2018-09-28 ENCOUNTER — Ambulatory Visit: Payer: Medicare Other | Admitting: Emergency Medicine

## 2018-10-04 ENCOUNTER — Telehealth: Payer: Self-pay | Admitting: Emergency Medicine

## 2018-10-04 ENCOUNTER — Other Ambulatory Visit: Payer: Self-pay

## 2018-10-04 ENCOUNTER — Inpatient Hospital Stay (HOSPITAL_COMMUNITY)
Admission: EM | Admit: 2018-10-04 | Discharge: 2018-10-15 | DRG: 175 | Disposition: A | Discharge status: Hospice | Payer: Medicare Other | Attending: Internal Medicine | Admitting: Internal Medicine

## 2018-10-04 ENCOUNTER — Encounter (HOSPITAL_COMMUNITY): Payer: Self-pay | Admitting: Emergency Medicine

## 2018-10-04 ENCOUNTER — Emergency Department (HOSPITAL_COMMUNITY): Payer: Medicare Other

## 2018-10-04 DIAGNOSIS — Z8249 Family history of ischemic heart disease and other diseases of the circulatory system: Secondary | ICD-10-CM

## 2018-10-04 DIAGNOSIS — I2699 Other pulmonary embolism without acute cor pulmonale: Principal | ICD-10-CM | POA: Diagnosis present

## 2018-10-04 DIAGNOSIS — D696 Thrombocytopenia, unspecified: Secondary | ICD-10-CM

## 2018-10-04 DIAGNOSIS — I4891 Unspecified atrial fibrillation: Secondary | ICD-10-CM | POA: Diagnosis present

## 2018-10-04 DIAGNOSIS — J441 Chronic obstructive pulmonary disease with (acute) exacerbation: Secondary | ICD-10-CM | POA: Diagnosis present

## 2018-10-04 DIAGNOSIS — Z7982 Long term (current) use of aspirin: Secondary | ICD-10-CM

## 2018-10-04 DIAGNOSIS — R7989 Other specified abnormal findings of blood chemistry: Secondary | ICD-10-CM

## 2018-10-04 DIAGNOSIS — Z972 Presence of dental prosthetic device (complete) (partial): Secondary | ICD-10-CM

## 2018-10-04 DIAGNOSIS — I872 Venous insufficiency (chronic) (peripheral): Secondary | ICD-10-CM | POA: Diagnosis present

## 2018-10-04 DIAGNOSIS — Z87891 Personal history of nicotine dependence: Secondary | ICD-10-CM

## 2018-10-04 DIAGNOSIS — A419 Sepsis, unspecified organism: Secondary | ICD-10-CM | POA: Diagnosis present

## 2018-10-04 DIAGNOSIS — Z8546 Personal history of malignant neoplasm of prostate: Secondary | ICD-10-CM

## 2018-10-04 DIAGNOSIS — R059 Cough, unspecified: Secondary | ICD-10-CM

## 2018-10-04 DIAGNOSIS — E860 Dehydration: Secondary | ICD-10-CM | POA: Diagnosis present

## 2018-10-04 DIAGNOSIS — R05 Cough: Secondary | ICD-10-CM

## 2018-10-04 DIAGNOSIS — Z9841 Cataract extraction status, right eye: Secondary | ICD-10-CM

## 2018-10-04 DIAGNOSIS — Z66 Do not resuscitate: Secondary | ICD-10-CM | POA: Diagnosis present

## 2018-10-04 DIAGNOSIS — J9621 Acute and chronic respiratory failure with hypoxia: Secondary | ICD-10-CM | POA: Diagnosis not present

## 2018-10-04 DIAGNOSIS — Z7401 Bed confinement status: Secondary | ICD-10-CM

## 2018-10-04 DIAGNOSIS — Z961 Presence of intraocular lens: Secondary | ICD-10-CM | POA: Diagnosis present

## 2018-10-04 DIAGNOSIS — Z7951 Long term (current) use of inhaled steroids: Secondary | ICD-10-CM

## 2018-10-04 DIAGNOSIS — R0602 Shortness of breath: Secondary | ICD-10-CM

## 2018-10-04 DIAGNOSIS — B379 Candidiasis, unspecified: Secondary | ICD-10-CM | POA: Diagnosis present

## 2018-10-04 DIAGNOSIS — R791 Abnormal coagulation profile: Secondary | ICD-10-CM | POA: Diagnosis present

## 2018-10-04 DIAGNOSIS — R5381 Other malaise: Secondary | ICD-10-CM

## 2018-10-04 DIAGNOSIS — Z825 Family history of asthma and other chronic lower respiratory diseases: Secondary | ICD-10-CM

## 2018-10-04 DIAGNOSIS — D649 Anemia, unspecified: Secondary | ICD-10-CM | POA: Diagnosis present

## 2018-10-04 DIAGNOSIS — E782 Mixed hyperlipidemia: Secondary | ICD-10-CM | POA: Diagnosis present

## 2018-10-04 DIAGNOSIS — I714 Abdominal aortic aneurysm, without rupture: Secondary | ICD-10-CM | POA: Diagnosis present

## 2018-10-04 DIAGNOSIS — Z888 Allergy status to other drugs, medicaments and biological substances status: Secondary | ICD-10-CM

## 2018-10-04 DIAGNOSIS — G629 Polyneuropathy, unspecified: Secondary | ICD-10-CM | POA: Diagnosis present

## 2018-10-04 DIAGNOSIS — Z79899 Other long term (current) drug therapy: Secondary | ICD-10-CM

## 2018-10-04 DIAGNOSIS — I480 Paroxysmal atrial fibrillation: Secondary | ICD-10-CM | POA: Diagnosis present

## 2018-10-04 DIAGNOSIS — Z515 Encounter for palliative care: Secondary | ICD-10-CM | POA: Diagnosis not present

## 2018-10-04 DIAGNOSIS — Z9842 Cataract extraction status, left eye: Secondary | ICD-10-CM

## 2018-10-04 DIAGNOSIS — Z7952 Long term (current) use of systemic steroids: Secondary | ICD-10-CM

## 2018-10-04 DIAGNOSIS — Z9981 Dependence on supplemental oxygen: Secondary | ICD-10-CM

## 2018-10-04 LAB — D-DIMER, QUANTITATIVE: D-Dimer, Quant: 12.18 ug/mL-FEU — ABNORMAL HIGH (ref 0.00–0.50)

## 2018-10-04 LAB — BASIC METABOLIC PANEL
Anion gap: 12 (ref 5–15)
BUN: 33 mg/dL — ABNORMAL HIGH (ref 8–23)
CO2: 23 mmol/L (ref 22–32)
CREATININE: 1.01 mg/dL (ref 0.61–1.24)
Calcium: 8.8 mg/dL — ABNORMAL LOW (ref 8.9–10.3)
Chloride: 103 mmol/L (ref 98–111)
GFR calc Af Amer: >60 mL/min (ref >60)
GFR calc non Af Amer: >60 mL/min (ref >60)
Glucose, Bld: 251 mg/dL — ABNORMAL HIGH (ref 70–99)
Potassium: 4.3 mmol/L (ref 3.5–5.1)
Sodium: 138 mmol/L (ref 135–145)

## 2018-10-04 LAB — CBC
HCT: 41.2 % (ref 39.0–52.0)
Hemoglobin: 13.3 g/dL (ref 13.0–17.0)
MCH: 30 pg (ref 26.0–34.0)
MCHC: 32.3 g/dL (ref 30.0–36.0)
MCV: 93 fL (ref 80.0–100.0)
Platelets: 146 10*3/uL — ABNORMAL LOW (ref 150–400)
RBC: 4.43 MIL/uL (ref 4.22–5.81)
RDW: 16.9 % — ABNORMAL HIGH (ref 11.5–15.5)
WBC: 16.4 10*3/uL — ABNORMAL HIGH (ref 4.0–10.5)
nRBC: 0 % (ref 0.0–0.2)

## 2018-10-04 LAB — I-STAT VENOUS BLOOD GAS, ED
Acid-Base Excess: 4 mmol/L — ABNORMAL HIGH (ref 0.0–2.0)
Bicarbonate: 28.1 mmol/L — ABNORMAL HIGH (ref 20.0–28.0)
O2 Saturation: 87 %
TCO2: 29 mmol/L (ref 22–32)
pCO2, Ven: 38.7 mmHg — ABNORMAL LOW (ref 44.0–60.0)
pH, Ven: 7.468 — ABNORMAL HIGH (ref 7.250–7.430)
pO2, Ven: 50 mmHg — ABNORMAL HIGH (ref 32.0–45.0)

## 2018-10-04 LAB — I-STAT CG4 LACTIC ACID, ED
Lactic Acid, Venous: 3.97 mmol/L (ref 0.5–1.9)
Lactic Acid, Venous: 1.66 mmol/L (ref 0.5–1.9)

## 2018-10-04 LAB — PROCALCITONIN: Procalcitonin: 0.11 ng/mL

## 2018-10-04 LAB — I-STAT TROPONIN, ED: Troponin i, poc: 0.05 ng/mL (ref 0.00–0.08)

## 2018-10-04 MED ORDER — ACETAMINOPHEN 325 MG PO TABS
650.0000 mg | ORAL_TABLET | Freq: Four times a day (QID) | ORAL | Status: DC | PRN
  Administered 2018-10-05 – 2018-10-14 (×2): 650 mg via ORAL
  Filled 2018-10-04 (×2): qty 2

## 2018-10-04 MED ORDER — SODIUM CHLORIDE 0.9 % IV SOLN
500.0000 mg | INTRAVENOUS | Status: DC
  Administered 2018-10-04: 500 mg via INTRAVENOUS
  Filled 2018-10-04: qty 500

## 2018-10-04 MED ORDER — ENOXAPARIN SODIUM 40 MG/0.4ML ~~LOC~~ SOLN: 40.0000 mg | SUBCUTANEOUS | Status: DC

## 2018-10-04 MED ORDER — GUAIFENESIN ER 600 MG PO TB12
600.0000 mg | ORAL_TABLET | Freq: Two times a day (BID) | ORAL | Status: DC
  Administered 2018-10-04 – 2018-10-15 (×22): 600 mg via ORAL
  Filled 2018-10-04 (×22): qty 1

## 2018-10-04 MED ORDER — SODIUM CHLORIDE 0.9 % IV SOLN
2.0000 g | INTRAVENOUS | Status: DC
  Administered 2018-10-04: 2 g via INTRAVENOUS
  Filled 2018-10-04: qty 20

## 2018-10-04 MED ORDER — ACETAMINOPHEN 650 MG RE SUPP: 650.0000 mg | Freq: Four times a day (QID) | RECTAL | Status: DC | PRN

## 2018-10-04 MED ORDER — METHYLPREDNISOLONE SODIUM SUCC 125 MG IJ SOLR
125.0000 mg | Freq: Once | INTRAMUSCULAR | Status: AC
  Administered 2018-10-04: 125 mg via INTRAVENOUS
  Filled 2018-10-04: qty 2

## 2018-10-04 MED ORDER — DILTIAZEM HCL ER COATED BEADS 120 MG PO CP24: 120.0000 mg | ORAL_CAPSULE | Freq: Every evening | ORAL | Status: DC

## 2018-10-04 MED ORDER — SODIUM CHLORIDE 0.9 % IV SOLN
INTRAVENOUS | Status: AC
  Administered 2018-10-04 – 2018-10-05 (×2): via INTRAVENOUS

## 2018-10-04 MED ORDER — IPRATROPIUM BROMIDE 0.02 % IN SOLN
0.5000 mg | Freq: Four times a day (QID) | RESPIRATORY_TRACT | Status: DC
  Administered 2018-10-05 (×4): 0.5 mg via RESPIRATORY_TRACT
  Filled 2018-10-04 (×4): qty 2.5

## 2018-10-04 MED ORDER — ACETYLCYSTEINE 10 % IN SOLN
2.0000 mL | Freq: Two times a day (BID) | RESPIRATORY_TRACT | Status: DC
  Filled 2018-10-04 (×8): qty 2

## 2018-10-04 MED ORDER — DILTIAZEM HCL ER 120 MG PO CP24: 120.0000 mg | ORAL_CAPSULE | ORAL | Status: DC

## 2018-10-04 MED ORDER — SODIUM CHLORIDE 0.9 % IV BOLUS
1000.0000 mL | Freq: Once | INTRAVENOUS | Status: AC
  Administered 2018-10-04: 1000 mL via INTRAVENOUS

## 2018-10-04 MED ORDER — LEVALBUTEROL HCL 0.63 MG/3ML IN NEBU
0.6300 mg | INHALATION_SOLUTION | Freq: Four times a day (QID) | RESPIRATORY_TRACT | Status: DC
  Administered 2018-10-05: 0.63 mg via RESPIRATORY_TRACT
  Filled 2018-10-04: qty 3

## 2018-10-04 MED ORDER — DILTIAZEM HCL ER COATED BEADS 240 MG PO CP24: 240.0000 mg | ORAL_CAPSULE | Freq: Every morning | ORAL | Status: DC

## 2018-10-04 MED ORDER — IPRATROPIUM BROMIDE 0.02 % IN SOLN
0.5000 mg | Freq: Once | RESPIRATORY_TRACT | Status: AC
  Administered 2018-10-04: 0.5 mg via RESPIRATORY_TRACT
  Filled 2018-10-04: qty 2.5

## 2018-10-04 MED ORDER — SODIUM CHLORIDE 0.9 % IV BOLUS (SEPSIS)
1000.0000 mL | Freq: Once | INTRAVENOUS | Status: AC
  Administered 2018-10-04: 1000 mL via INTRAVENOUS

## 2018-10-04 MED ORDER — PREGABALIN 75 MG PO CAPS
150.0000 mg | ORAL_CAPSULE | Freq: Three times a day (TID) | ORAL | Status: DC
  Administered 2018-10-04 – 2018-10-15 (×33): 150 mg via ORAL
  Filled 2018-10-04 (×12): qty 2
  Filled 2018-10-04 (×2): qty 6
  Filled 2018-10-04 (×19): qty 2

## 2018-10-04 MED ORDER — METHYLPREDNISOLONE SODIUM SUCC 125 MG IJ SOLR
60.0000 mg | Freq: Three times a day (TID) | INTRAMUSCULAR | Status: DC
  Administered 2018-10-05 (×2): 60 mg via INTRAVENOUS
  Filled 2018-10-04 (×2): qty 2

## 2018-10-04 NOTE — ED Notes (Signed)
Faxed medical records request to [facility] supervisor

## 2018-10-04 NOTE — ED Triage Notes (Signed)
Per EMS Pt is from home with complaints of SOB x24hours pt is on 3L of O2 all the time.  EMS reports that family states he has had pneumonia recently.  NAD noted at this time 94% on 3L

## 2018-10-04 NOTE — ED Provider Notes (Signed)
Medical screening examination/treatment/procedure(s) were conducted as a shared visit with non-physician practitioner(s) and myself.  I personally evaluated the patient during the encounter.  None Patient has had recent discharge from the hospital.  He has had recurrent hospitalizations now for COPD exacerbations and pneumonia.  He is only been on the hospital less than a week and having difficulty breathing with wet cough and severely increasing fatigue.  They have not documented a fever since discharge.  Patient was discharged on doxycycline.  He also has been prescribed liquid morphine to be used every 4 hours for respiratory distress.  He reports it makes him too tired to be awake or do anything and thus he self discontinued it as of yesterday afternoon.  Patient is fatigued and chronically ill in appearance.  He has tachypnea.  He is situationally oriented and answers questions.  Breath sounds are very soft with wet cough and occasional rhonchi.  Heart tachycardic to the low 100s.  Abdomen nondistended.  No significant peripheral edema.  Skin pale but warm and dry.  I agree with plan and management.   Charlesetta Shanks, MD 10/04/18 2200

## 2018-10-04 NOTE — ED Provider Notes (Signed)
[facility] EMERGENCY DEPARTMENT Provider Note   CSN: [id number] Arrival date & time: 10/04/18  1729   History   Chief Complaint Chief Complaint  Patient presents with  . Shortness of Breath    HPI [name] [name] is a 78 y.o. male with past medical history significant for atrial fibrillation, AAA, COPD, recurrent pneumonia who presents for evaluation of shortness of breath.  Family in room helps provide history.  Patient recently hospitalized at [facility] rocking him approximately 1 week ago.  Patient was admitted at that time for urinary tract infection as well as pneumonia.  Patient DC home on doxycycline.  Family states that patient has been progressively declining since he has got home.  Patient has had shortness of breath x24 hours.  Has been using home nebulizers without relief of symptoms.  Patient on baseline oxygen 2-3 L.  Patient states he has had a cough productive of green mucus.  Denies fevers however states he has had chills. Denies abdominal pain, nausea, vomiting, diarrhea, dysuria or constipation.  Patient's family states he is unable to tolerate albuterol nebulizers secondary to his atrial fibrillation.  Has been using home Atrovent nebulizers.  Patient states he is unable to ambulate secondary to shortness of breath.  History obtained from family and patient.  No interpreter was used.  HPI  Past Medical History:  Diagnosis Date  . AAA (abdominal aortic aneurysm) ([place])   . Asthma   . COPD with emphysema ([place])    Dr. Joya Gaskins, [place] stage C  . DJD (degenerative joint disease), lumbar   . Emphysema of lung ([place])   . History of prostate cancer    Status post radioactive seed implants  . History of shingles   . Incomplete emptying of bladder   . Mixed hyperlipidemia   . PAF (paroxysmal atrial fibrillation) ([place])   . Palpitations   . Peripheral neuropathy   . Prostate cancer ([place])   . Sciatic nerve pain    Secondary to shingles 2011  . Urethral stricture     . Wears partial dentures    Upper and lower    Patient Active Problem List   Diagnosis Date Noted  . COPD with acute exacerbation ([place]) 08/11/2018  . Wears partial dentures 08/11/2018  . Sebaceous cyst   . Chronic venous insufficiency 03/31/2018  . SIRS (systemic inflammatory response syndrome) ([place]) 12/15/2017  . Chronic respiratory failure ([place]) 12/15/2017  . Elevated lactic acid level   . Hypotension   . Tachycardia 12/14/2017  . Malnutrition of moderate degree 12/09/2017  . Sepsis ([place]) 12/07/2017  . Protein-calorie malnutrition, severe 11/27/2017  . Orthostatic hypotension 11/25/2017  . Leukocytosis 11/25/2017  . Abdominal aortic aneurysm (AAA) without rupture ([place]) 10/27/2017  . Multiple lung nodules on CT 05/14/2016  . COPD, severe ([place]) 02/19/2016  . Emphysema of lung ([place]) 02/19/2016  . Personal history of prostate cancer   . Mixed hyperlipidemia   . DJD (degenerative joint disease), lumbar   . Neuropathy   . Back pain   . 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 ATRIAL FIBRILLATION, PAROXYSMAL 08/31/2007    Past Surgical History:  Procedure Laterality Date  . CATARACT EXTRACTION W/ INTRAOCULAR LENS  IMPLANT, BILATERAL  2012  . COLONOSCOPY  02-14-2003  . CYSTO/  BALLOON DILATION AND INCISION BLADDER NECK CONTRACTURE  X5  LAST ONE 01-24-2009  . CYSTOSCOPY WITH URETHRAL DILATATION  09/15/2011   Procedure: CYSTOSCOPY WITH URETHRAL DILATATION;  Surgeon: Bernestine Amass, MD;  Location: [facility];  Service: Urology;  Laterality: N/A;  CYSTOSCOPY, BALLOON DILATION   . CYSTOSCOPY WITH URETHRAL DILATATION N/A 04/03/2014   Procedure: CYSTOSCOPY WITH dilatation;  Surgeon: Bernestine Amass, MD;  Location: [facility];  Service: Urology;  Laterality: N/A;  . EYE SURGERY    . MASS EXCISION N/A 06/16/2018   Procedure: EXCISION PERIANAL CYST;  Surgeon: Aviva Signs, MD;  Location: [facility];  Service: General;  Laterality: N/A;  . RADIOACTIVE PROSTATE  SEED IMPLANTS/  CYSTO WITH BALLOON DILATION BLADDER NECK CONTRACTURE  11-26-2006        Home Medications    Prior to Admission medications   Medication Sig Start Date End Date Taking? Authorizing Provider  acetylcysteine (MUCOMYST) 10 % nebulizer solution Take 2 mLs by nebulization 2 (two) times daily. 09/22/18   Collene Gobble, MD  aspirin EC 81 MG tablet Take 1 tablet (81 mg total) by mouth every other day. Patient taking differently: Take 81 mg by mouth every Monday, Wednesday, and Friday.  05/05/18   Satira Sark, MD  diltiazem (DILT-XR) 120 MG 24 hr capsule TAKE 2 CAPSULES BY MOUTH IN THE MORNING AND 1 IN THE EVENING 07/22/18   Satira Sark, MD  guaiFENesin (MUCINEX) 600 MG 12 hr tablet Take 1 tablet (600 mg total) by mouth 2 (two) times daily. 08/14/18   Kathie Dike, MD  ipratropium-albuterol (DUONEB) 0.5-2.5 (3) MG/3ML SOLN Take 3 mLs by nebulization every 6 (six) hours as needed. 08/30/18   Martyn Ehrich, NP  levofloxacin (LEVAQUIN) 500 MG tablet Take 1 tablet (500 mg total) by mouth daily. 08/24/18   Martyn Ehrich, NP  LYRICA 150 MG capsule TAKE ONE CAPSULE BY MOUTH THREE TIMES DAILY Patient taking differently: Take 150 mg by mouth every 8 (eight) hours.  05/23/14   Marcial Pacas, MD  mometasone-formoterol (DULERA) 200-5 MCG/ACT AERO Inhale 2 puffs into the lungs 2 (two) times daily. 08/14/18   Kathie Dike, MD  nitroGLYCERIN (NITROSTAT) 0.4 MG SL tablet Place 1 tablet (0.4 mg total) under the tongue every 5 (five) minutes as needed for chest pain. 04/22/18   Nat Christen, MD  Oxycodone HCl 10 MG TABS Take 10 mg by mouth 4 (four) times daily. Take 1 tablet every 6 hours    [provider]  pantoprazole (PROTONIX) 40 MG tablet Take 1 tablet (40 mg total) by mouth daily at 6 (six) AM. Patient not taking: Reported on 08/24/2018 08/15/18   Kathie Dike, MD  predniSONE (DELTASONE) 10 MG tablet Take 40mg  daily for 3 days, then 30mg  daily for 3 days, then 20mg   daily for 3 days, then 10mg  daily for 3 days, then STOP. 08/20/18   Collene Gobble, MD  predniSONE (DELTASONE) 10 MG tablet 1 tab daily 08/24/18   Martyn Ehrich, NP    Family History Family History  Problem Relation Age of Onset  . COPD Mother   . Heart disease Brother        died at 49    Social History Social History   Tobacco Use  . Smoking status: Former Smoker    Packs/day: 2.00    Years: 40.00    Pack years: 80.00    Types: Cigarettes    Start date: 06/19/1954    Last attempt to quit: 09/15/1998    Years since quitting: 20.0  . Smokeless tobacco: Never Used  . Tobacco comment: Counseled to remain smoke free  Substance Use Topics  . Alcohol use: No  Alcohol/week: 0.0 standard drinks  . Drug use: No     Allergies   Albuterol   Review of Systems Review of Systems  Constitutional: Positive for chills.  HENT: Negative.   Respiratory: Positive for cough, chest tightness, shortness of breath and wheezing. Negative for apnea, choking and stridor.   Gastrointestinal: Negative.   Genitourinary: Negative.   Musculoskeletal: Negative.   Skin: Negative.   Neurological: Negative.   All other systems reviewed and are negative.    Physical Exam Updated Vital Signs BP (!) 141/74   Pulse 93   Temp 99 F (37.2 C)   Resp (!) 25   Ht 5\' 10"  (1.778 m)   Wt 59 kg   SpO2 97%   BMI 18.65 kg/m   Physical Exam Vitals signs and nursing note reviewed.  Constitutional:      General: He is not in acute distress.    Appearance: He is well-developed. He is not diaphoretic.  HENT:     Head: Atraumatic.     Mouth/Throat:     Comments: Dry mucous membranes. Eyes:     Pupils: Pupils are equal, round, and reactive to light.  Neck:     Musculoskeletal: Normal range of motion and neck supple.  Cardiovascular:     Rate and Rhythm: Regular rhythm. Tachycardia present.     Pulses: Normal pulses.     Heart sounds: Normal heart sounds.  Pulmonary:     Effort: Pulmonary  effort is normal. Tachypnea and prolonged expiration present. No accessory muscle usage, respiratory distress or retractions.     Comments: Decreased breath sounds bilaterally.  Has mild wheezing and course rhonchi diffusely. Unable to speak in full sentences without difficulty. Speaks in 3-4 word answers. Abdominal:     General: There is no distension.     Palpations: Abdomen is soft.     Comments: Soft, nontender without.rebound or guarding.  Musculoskeletal: Normal range of motion.     Comments: No lower extremity swelling.  Skin:    General: Skin is warm and dry.  Neurological:     Mental Status: He is alert.      ED Treatments / Results  Labs (all labs ordered are listed, but only abnormal results are displayed) Labs Reviewed  BASIC METABOLIC PANEL - Abnormal; Notable for the following components:      Result Value   Glucose, Bld 251 (*)    BUN 33 (*)    Calcium 8.8 (*)    All other components within normal limits  CBC - Abnormal; Notable for the following components:   WBC 16.4 (*)    RDW 16.9 (*)    Platelets 146 (*)    All other components within normal limits  I-STAT CG4 LACTIC ACID, ED - Abnormal; Notable for the following components:   Lactic Acid, Venous 3.97 (*)    All other components within normal limits  I-STAT VENOUS BLOOD GAS, ED - Abnormal; Notable for the following components:   pH, Ven 7.468 (*)    pCO2, Ven 38.7 (*)    pO2, Ven 50.0 (*)    Bicarbonate 28.1 (*)    Acid-Base Excess 4.0 (*)    All other components within normal limits  URINE CULTURE  CULTURE, BLOOD (ROUTINE X 2)  CULTURE, BLOOD (ROUTINE X 2)  URINALYSIS, ROUTINE W REFLEX MICROSCOPIC  CBC  PROCALCITONIN  I-STAT TROPONIN, ED  I-STAT CG4 LACTIC ACID, ED    EKG EKG Interpretation  Date/Time:  Monday October 04 2018 17:57:28 EST  Ventricular Rate:  100 PR Interval:    QRS Duration: 68 QT Interval:  346 QTC Calculation: 447 R Axis:   79 Text Interpretation:  Sinus tachycardia  Probable left atrial enlargement no sig change from previous Confirmed by Charlesetta Shanks [phone]) on 10/04/2018 9:59:59 PM   Radiology Dg Chest Port 1 View  Result Date: 10/04/2018 CLINICAL DATA:  78 year old male with deep productive cough and shortness breath for 24 hours. History of COPD. Recent hospitalization for right lower lobe pneumonia. Subsequent encounter. EXAM: PORTABLE CHEST 1 VIEW COMPARISON:  09/23/2018 chest x-ray.  09/13/2018 chest CT. FINDINGS: No infiltrate, congestive heart failure or pneumothorax noted. Emphysematous changes. CT detected right upper lobe solid/sub solid nodule not appreciated on present plain film exam. Heart size within normal limits.  Calcified aorta. IMPRESSION: 1. No infiltrate, congestive heart failure or pneumothorax noted. 2. 09/13/2018 CT detected right upper lobe solid/sub solid nodule not appreciated on present plain film exam exam. Follow-up CT as previously recommended (3 months from 09/13/2018). 3.  Emphysema (ICD10-J43.9). Electronically Signed   By: Genia Del M.D.   On: 10/04/2018 18:31    Procedures Procedures (including critical care time)  Medications Ordered in ED Medications  cefTRIAXone (ROCEPHIN) 2 g in sodium chloride 0.9 % 100 mL IVPB (0 g Intravenous Stopped 10/04/18 1950)  azithromycin (ZITHROMAX) 500 mg in sodium chloride 0.9 % 250 mL IVPB (0 mg Intravenous Stopped 10/04/18 2059)  ipratropium (ATROVENT) nebulizer solution 0.5 mg (0.5 mg Nebulization Given 10/04/18 1843)  sodium chloride 0.9 % bolus 1,000 mL (0 mLs Intravenous Stopped 10/04/18 2022)  methylPREDNISolone sodium succinate (SOLU-MEDROL) 125 mg/2 mL injection 125 mg (125 mg Intravenous Given 10/04/18 1844)  sodium chloride 0.9 % bolus 1,000 mL (0 mLs Intravenous Stopped 10/04/18 2059)  ipratropium (ATROVENT) nebulizer solution 0.5 mg (0.5 mg Nebulization Given 10/04/18 2035)     Initial Impression / Assessment and Plan / ED Course  I have reviewed the triage vital signs  and the nursing notes.  Pertinent labs & imaging results that were available during my care of the patient were reviewed by me and considered in my medical decision making (see chart for details).  78 year old male who appears chronically ill presents for evaluation of shortness of breath.  Recently hospitalized 1 week ago for pneumonia at [facility] rocking him.  Progressive decline since discharge.  Has been using home nebulizer treatments without relief of symptoms.  Was DC'd on doxycycline.  Unable to tolerate albuterol secondary to paroxysmal atrial fibrillation.  Not on anticoagulation for atrial fibrillation.  Chronic steroid use 30 mg prednisone p.o. daily.  Afebrile.  Decreased lung sounds bilaterally.  Increased work of breathing, tachycardic and tachypneic. Will obtain labs, imaging and reevaluate. Code sepsis initially called secondary to recent pneumonia with increased lactic acid and tachycardia with tachypnea. Patient has recieved sepsis fluids and Abx.  CBC with leukocytosis, this may be secondary to patient's chronic steroid use.  Lactic acid 3.97, troponin negative, metabolic panel with elevation of glucose at 251, BUN 33.  Rectal temperature 99.0.  Patient continues to be tachycardic and tachypneic.  Nursing has been notified that patient needs urinalysis. Will give additional nebulizers and reevaluate.  Chest x-ray negative for infiltrates, cardiomegaly, pneumothorax.   I have thoroughly reviewed patients past medical records including obtained medical records from recent admission from [facility] with family about hospice/palliative care, however patient and family wish at this time to not place on hospice, per review of [facility] note. I have discussed this with  family at present. State that they would like to continue with full care at this time.  On reevaluation patient with decreased work of breathing with second nebulizer treatment.  Patient will need admission for COPD  exacerbation vs sepsis.  Will consult with hospitalist for admission. Repeat lactic acid 1.66. Urine unable to be obtained from foley at this time. EKG sinus tachycardia.  Consulted with Marlowe Sax, [facility] hospitalist who agrees for admission of patient.   Patient has been seen and evaluated by my attending Dr. Johnney Killian who agrees with above treatment, plan and disposition of patient. Final Clinical Impressions(s) / ED Diagnoses   Final diagnoses:  COPD exacerbation ([place])  Elevated lactic acid level    ED Discharge Orders    None       ,  A, PA-C 10/04/18 2202    Charlesetta Shanks, MD 10/06/18 1611

## 2018-10-04 NOTE — Telephone Encounter (Signed)
Called and spoke with Patients Grandson.  He stated that they were going to take Patient to ED and they were wanting to know if Dr. Lamonte Sakai would see him. The family are thinking he has pna again and a COPD flair. Advised that he needed to be seen at [facility] or [facility], and our pulmonary providers would be able to follow up with him if needed. Understanding stated. Nothing further at this time.

## 2018-10-04 NOTE — ED Notes (Signed)
Unable to obtain a sterile urine sample from pt's foley.

## 2018-10-04 NOTE — H&P (Addendum)
History and Physical    [name] [name] UXN:[id number] DOB: [birth year] DOA: 10/04/2018  PCP: [name], MD Patient coming from: Home  Chief Complaint: SOB  HPI: [name] [name] is a 78 y.o. male with medical history significant of former smoker (80 pack-year history), COPD with frequent exacerbations (oxygen and prednisone dependent), hyperlipidemia, paroxysmal atrial fibrillation presenting to the hospital for evaluation of shortness of breath.  Patient reports having chronic shortness of breath due to his COPD and uses 2 to 3 L home oxygen at all times.  States his dyspnea has been worse for the past 2 days and he has been having generalized weakness.  Denies having any cough or fevers.  Also reports having intermittent pain across his chest, even at rest for several months which occurs when he is really short of breath.  Family at bedside states patient has had multiple hospitalizations due to his COPD.  States he was recently admitted to [facility] for pneumonia and COPD exacerbation.  He was discharged 5 days ago with doxycycline, steroid, and a prescription for liquid morphine to help with his breathing.  Family states the doctor at [facility] had recommended palliative care but patient had declined.  States patient stopped taking morphine as it was making him too lethargic.  Family states patient is now so weak that he is not even able to get out of bed, as such, he has not been able to see his pulmonologist Dr. Lamonte Sakai.  Review of Systems: As per HPI otherwise 10 point review of systems negative.  Past Medical History:  Diagnosis Date  . AAA (abdominal aortic aneurysm) ([place])   . Asthma   . COPD with emphysema ([place])    Dr. Joya Gaskins, Gold stage C  . DJD (degenerative joint disease), lumbar   . Emphysema of lung ([place])   . History of prostate cancer    Status post radioactive seed implants  . History of shingles   . Incomplete emptying of bladder   . Mixed hyperlipidemia   . PAF  (paroxysmal atrial fibrillation) ([place])   . Palpitations   . Peripheral neuropathy   . Prostate cancer ([place])   . Sciatic nerve pain    Secondary to shingles 2011  . Urethral stricture   . Wears partial dentures    Upper and lower    Past Surgical History:  Procedure Laterality Date  . CATARACT EXTRACTION W/ INTRAOCULAR LENS  IMPLANT, BILATERAL  2012  . COLONOSCOPY  02-14-2003  . CYSTO/  BALLOON DILATION AND INCISION BLADDER NECK CONTRACTURE  X5  LAST ONE 01-24-2009  . CYSTOSCOPY WITH URETHRAL DILATATION  09/15/2011   Procedure: CYSTOSCOPY WITH URETHRAL DILATATION;  Surgeon: Bernestine Amass, MD;  Location: [facility];  Service: Urology;  Laterality: N/A;  CYSTOSCOPY, BALLOON DILATION   . CYSTOSCOPY WITH URETHRAL DILATATION N/A 04/03/2014   Procedure: CYSTOSCOPY WITH dilatation;  Surgeon: Bernestine Amass, MD;  [facility];  Service: Urology;  Laterality: N/A;  . EYE SURGERY    . MASS EXCISION N/A 06/16/2018   Procedure: EXCISION PERIANAL CYST;  Surgeon: Aviva Signs, MD;  Location: [facility];  Service: General;  Laterality: N/A;  . RADIOACTIVE PROSTATE SEED IMPLANTS/  CYSTO WITH BALLOON DILATION BLADDER NECK CONTRACTURE  11-26-2006     reports that he quit smoking about 20 years ago. His smoking use included cigarettes. He started smoking about 64 years ago. He has a 80.00 pack-year smoking history. He has never used smokeless tobacco.  He reports that he does not drink alcohol or use drugs.  Allergies  Allergen Reactions  . Albuterol Other (See Comments)    Continuous nebulization caused extreme tachycardia. May use the inhaler.    Family History  Problem Relation Age of Onset  . COPD Mother   . Heart disease Brother        died at 49    Prior to Admission medications   Medication Sig Start Date End Date Taking? Authorizing Provider  acetylcysteine (MUCOMYST) 10 % nebulizer solution Take 2 mLs by nebulization 2 (two) times daily. 09/22/18    Collene Gobble, MD  aspirin EC 81 MG tablet Take 1 tablet (81 mg total) by mouth every other day. Patient taking differently: Take 81 mg by mouth every Monday, Wednesday, and Friday.  05/05/18   Satira Sark, MD  diltiazem (DILT-XR) 120 MG 24 hr capsule TAKE 2 CAPSULES BY MOUTH IN THE MORNING AND 1 IN THE EVENING 07/22/18   Satira Sark, MD  guaiFENesin (MUCINEX) 600 MG 12 hr tablet Take 1 tablet (600 mg total) by mouth 2 (two) times daily. 08/14/18   Kathie Dike, MD  ipratropium-albuterol (DUONEB) 0.5-2.5 (3) MG/3ML SOLN Take 3 mLs by nebulization every 6 (six) hours as needed. 08/30/18   Martyn Ehrich, NP  levofloxacin (LEVAQUIN) 500 MG tablet Take 1 tablet (500 mg total) by mouth daily. 08/24/18   Martyn Ehrich, NP  LYRICA 150 MG capsule TAKE ONE CAPSULE BY MOUTH THREE TIMES DAILY Patient taking differently: Take 150 mg by mouth every 8 (eight) hours.  05/23/14   Marcial Pacas, MD  mometasone-formoterol (DULERA) 200-5 MCG/ACT AERO Inhale 2 puffs into the lungs 2 (two) times daily. 08/14/18   Kathie Dike, MD  nitroGLYCERIN (NITROSTAT) 0.4 MG SL tablet Place 1 tablet (0.4 mg total) under the tongue every 5 (five) minutes as needed for chest pain. 04/22/18   Nat Christen, MD  Oxycodone HCl 10 MG TABS Take 10 mg by mouth 4 (four) times daily. Take 1 tablet every 6 hours    [provider]  pantoprazole (PROTONIX) 40 MG tablet Take 1 tablet (40 mg total) by mouth daily at 6 (six) AM. Patient not taking: Reported on 08/24/2018 08/15/18   Kathie Dike, MD  predniSONE (DELTASONE) 10 MG tablet Take 40mg  daily for 3 days, then 30mg  daily for 3 days, then 20mg  daily for 3 days, then 10mg  daily for 3 days, then STOP. 08/20/18   Collene Gobble, MD  predniSONE (DELTASONE) 10 MG tablet 1 tab daily 08/24/18   Martyn Ehrich, NP    Physical Exam: Vitals:   10/04/18 2100 10/04/18 2115 10/04/18 2130 10/04/18 2215  BP: (!) 141/74 132/71 (!) 148/76 121/74  Pulse: 93 88 88 90    Resp: (!) 25 (!) 22 (!) 26 (!) 30  Temp:    (!) 97.5 F (36.4 C)  TempSrc:      SpO2: 97% 97% 96% 100%  Weight:      Height:        Physical Exam  Constitutional: He is oriented to person, place, and time.  Chronically ill-appearing, frail  HENT:  Head: Normocephalic.  Mouth/Throat: Oropharynx is clear and moist.  Eyes: Right eye exhibits no discharge. Left eye exhibits no discharge.  Neck: Neck supple.  Cardiovascular: Normal rate, regular rhythm and intact distal pulses.  Pulmonary/Chest:  On 4 L supplemental oxygen Tachypneic Increased work of breathing Decreased air entry bilaterally.  Mild wheezing.  Abdominal: Soft.  Bowel sounds are normal. He exhibits no distension. There is no abdominal tenderness. There is no guarding.  Musculoskeletal:        General: No edema.     Comments: Bilateral lower extremity symmetric in size.  No erythema, increased warmth, or edema.  Neurological: He is alert and oriented to person, place, and time.  Skin: Skin is warm and dry.     Labs on Admission: I have personally reviewed following labs and imaging studies  CBC: Recent Labs  Lab 10/04/18 1756  WBC 16.4*  HGB 13.3  HCT 41.2  MCV 93.0  PLT 453*   Basic Metabolic Panel: Recent Labs  Lab 10/04/18 1756  NA 138  K 4.3  CL 103  CO2 23  GLUCOSE 251*  BUN 33*  CREATININE 1.01  CALCIUM 8.8*   GFR: Estimated Creatinine Clearance: 51.1 mL/min (by C-G formula based on SCr of 1.01 mg/dL). Liver Function Tests: No results for input(s): AST, ALT, ALKPHOS, BILITOT, PROT, ALBUMIN in the last 168 hours. No results for input(s): LIPASE, AMYLASE in the last 168 hours. No results for input(s): AMMONIA in the last 168 hours. Coagulation Profile: No results for input(s): INR, PROTIME in the last 168 hours. Cardiac Enzymes: No results for input(s): CKTOTAL, CKMB, CKMBINDEX, TROPONINI in the last 168 hours. BNP (last 3 results) No results for input(s): PROBNP in the last 8760  hours. HbA1C: No results for input(s): HGBA1C in the last 72 hours. CBG: No results for input(s): GLUCAP in the last 168 hours. Lipid Profile: No results for input(s): CHOL, HDL, LDLCALC, TRIG, CHOLHDL, LDLDIRECT in the last 72 hours. Thyroid Function Tests: No results for input(s): TSH, T4TOTAL, FREET4, T3FREE, THYROIDAB in the last 72 hours. Anemia Panel: No results for input(s): VITAMINB12, FOLATE, FERRITIN, TIBC, IRON, RETICCTPCT in the last 72 hours. Urine analysis:    Component Value Date/Time   COLORURINE YELLOW 08/11/2018 0823   APPEARANCEUR CLEAR 08/11/2018 0823   LABSPEC 1.025 08/11/2018 0823   PHURINE 5.0 08/11/2018 0823   GLUCOSEU NEGATIVE 08/11/2018 0823   HGBUR NEGATIVE 08/11/2018 0823   BILIRUBINUR NEGATIVE 08/11/2018 0823   KETONESUR 20 (A) 08/11/2018 0823   PROTEINUR NEGATIVE 08/11/2018 0823   NITRITE NEGATIVE 08/11/2018 0823   LEUKOCYTESUR NEGATIVE 08/11/2018 0823    Radiological Exams on Admission: Dg Chest Port 1 View  Result Date: 10/04/2018 CLINICAL DATA:  78 year old male with deep productive cough and shortness breath for 24 hours. History of COPD. Recent hospitalization for right lower lobe pneumonia. Subsequent encounter. EXAM: PORTABLE CHEST 1 VIEW COMPARISON:  09/23/2018 chest x-ray.  09/13/2018 chest CT. FINDINGS: No infiltrate, congestive heart failure or pneumothorax noted. Emphysematous changes. CT detected right upper lobe solid/sub solid nodule not appreciated on present plain film exam. Heart size within normal limits.  Calcified aorta. IMPRESSION: 1. No infiltrate, congestive heart failure or pneumothorax noted. 2. 09/13/2018 CT detected right upper lobe solid/sub solid nodule not appreciated on present plain film exam exam. Follow-up CT as previously recommended (3 months from 09/13/2018). 3.  Emphysema (ICD10-J43.9). Electronically Signed   By: Genia Del M.D.   On: 10/04/2018 18:31    EKG: Independently reviewed.  Sinus tachycardia (heart  rate 100).  Assessment/Plan Principal Problem:   Acute on chronic respiratory failure with hypoxia (HCC) Active Problems:   ATRIAL FIBRILLATION, PAROXYSMAL   Sepsis ([place])   COPD with acute exacerbation (HCC)   Thrombocytopenia (HCC)   Physical deconditioning  Acute on chronic hypoxic respiratory failure secondary to suspected COPD exacerbation -Tachypneic.  On 2 to 3 L home oxygen chronically, currently on 4 L.  Has decreased air entry bilaterally and mild wheezing on exam. -Recently admitted for pneumonia and COPD exacerbation at [facility]. -White count 16.4, previously elevated as well in the setting of chronic steroid use. -I-STAT troponin negative and EKG without acute ischemic changes. -No clinical signs of DVT on exam. Will check d-dimer as PE is also on the differential.  If positive, check CTA to rule out PE. -Received Solu-Medrol 125 mg in the ED.  Continue Solu-Medrol 60 mg every 8 hours starting tomorrow morning. -Levalbuterol-ipratropium every 6 hours -Continue ceftriaxone and azithromycin at this time.  Chest x-ray without evidence of pneumonia.  Check procalcitonin level. -Continue Mucomyst, Mucinex -Supplemental oxygen -I discussed consulting palliative care but patient declined.  He is interested in seeing a pulmonologist while he is at the hospital.  Family also prefers patient to be seen by pulmonology as inpatient as they are not able to take him to an outpatient clinic due to his physical deconditioning.  Please consult pulmonology in the morning.  Addendum: Procalcitonin 0.11.  Will discontinue antibiotics.  D-dimer elevated at 12.18.  STAT CT angiogram ordered to rule out PE.   Sepsis -Tachypneic in the setting of acute COPD exacerbation.  Not hypotensive.  Afebrile.  White count 16.4, previously elevated as well in the setting of chronic steroid use.  Lactic acid 3.97, improved to 1.66 with IV fluid.  Chest x-ray without evidence of pneumonia. -Continue  antibiotics as above -IV fluid -UA, urine culture pending -Blood culture x2 pending  Thrombocytopenia Platelet count 146,000, previously normal (253,000 a month ago).  No signs of active bleeding. -Repeat CBC in a.m. to confirm   Addendum: D-dimer elevated at 12.18.  Checking fibrinogen level in the setting of recent drop in platelet count to rule out DIC.  Check PT/aPTT.  Paroxysmal atrial fibrillation CHA2DS2VASc 3. Currently in sinus rhythm.  Patient has not been able to tolerate albuterol nebulizer treatments due to tachycardia therefore using levalbuterol instead.  Hold diltiazem at this time in the setting of sepsis. Per previous cardiology records, patient has no interest in anticoagulation.  Physical deconditioning -PT evaluation  DVT prophylaxis: SCDs Code Status: Partial code (no intubation or mechanical ventilation) Family Communication: Family at bedside. Disposition Plan: Anticipate discharge after clinical improvement. Consults called: None  Admission status: Observation, telemetry   Shela Leff MD [facility] Pager [phone]  If 7PM-7AM, please contact night-coverage [email or web address] Password [facility]  10/04/2018, 10:22 PM

## 2018-10-04 NOTE — ED Notes (Signed)
Dr. Dorothyann Gibbs called about Lactic results

## 2018-10-05 ENCOUNTER — Observation Stay (HOSPITAL_BASED_OUTPATIENT_CLINIC_OR_DEPARTMENT_OTHER): Payer: Medicare Other

## 2018-10-05 ENCOUNTER — Observation Stay (HOSPITAL_COMMUNITY): Payer: Medicare Other

## 2018-10-05 ENCOUNTER — Telehealth: Payer: Self-pay | Admitting: Cardiology

## 2018-10-05 DIAGNOSIS — Z8546 Personal history of malignant neoplasm of prostate: Secondary | ICD-10-CM | POA: Diagnosis not present

## 2018-10-05 DIAGNOSIS — I2699 Other pulmonary embolism without acute cor pulmonale: Secondary | ICD-10-CM | POA: Diagnosis present

## 2018-10-05 DIAGNOSIS — R0602 Shortness of breath: Secondary | ICD-10-CM

## 2018-10-05 DIAGNOSIS — E782 Mixed hyperlipidemia: Secondary | ICD-10-CM | POA: Diagnosis present

## 2018-10-05 DIAGNOSIS — E860 Dehydration: Secondary | ICD-10-CM | POA: Diagnosis present

## 2018-10-05 DIAGNOSIS — I714 Abdominal aortic aneurysm, without rupture: Secondary | ICD-10-CM | POA: Diagnosis present

## 2018-10-05 DIAGNOSIS — I2694 Multiple subsegmental pulmonary emboli without acute cor pulmonale: Secondary | ICD-10-CM

## 2018-10-05 DIAGNOSIS — Z66 Do not resuscitate: Secondary | ICD-10-CM | POA: Diagnosis present

## 2018-10-05 DIAGNOSIS — Z825 Family history of asthma and other chronic lower respiratory diseases: Secondary | ICD-10-CM | POA: Diagnosis not present

## 2018-10-05 DIAGNOSIS — J441 Chronic obstructive pulmonary disease with (acute) exacerbation: Secondary | ICD-10-CM

## 2018-10-05 DIAGNOSIS — I480 Paroxysmal atrial fibrillation: Secondary | ICD-10-CM | POA: Diagnosis present

## 2018-10-05 DIAGNOSIS — Z972 Presence of dental prosthetic device (complete) (partial): Secondary | ICD-10-CM | POA: Diagnosis not present

## 2018-10-05 DIAGNOSIS — R791 Abnormal coagulation profile: Secondary | ICD-10-CM | POA: Diagnosis present

## 2018-10-05 DIAGNOSIS — Z515 Encounter for palliative care: Secondary | ICD-10-CM | POA: Diagnosis not present

## 2018-10-05 DIAGNOSIS — Z9842 Cataract extraction status, left eye: Secondary | ICD-10-CM | POA: Diagnosis not present

## 2018-10-05 DIAGNOSIS — R5381 Other malaise: Secondary | ICD-10-CM | POA: Diagnosis not present

## 2018-10-05 DIAGNOSIS — A419 Sepsis, unspecified organism: Secondary | ICD-10-CM

## 2018-10-05 DIAGNOSIS — Z7952 Long term (current) use of systemic steroids: Secondary | ICD-10-CM | POA: Diagnosis not present

## 2018-10-05 DIAGNOSIS — Z7982 Long term (current) use of aspirin: Secondary | ICD-10-CM | POA: Diagnosis not present

## 2018-10-05 DIAGNOSIS — Z961 Presence of intraocular lens: Secondary | ICD-10-CM | POA: Diagnosis present

## 2018-10-05 DIAGNOSIS — I872 Venous insufficiency (chronic) (peripheral): Secondary | ICD-10-CM | POA: Diagnosis present

## 2018-10-05 DIAGNOSIS — D649 Anemia, unspecified: Secondary | ICD-10-CM | POA: Diagnosis present

## 2018-10-05 DIAGNOSIS — Z9841 Cataract extraction status, right eye: Secondary | ICD-10-CM | POA: Diagnosis not present

## 2018-10-05 DIAGNOSIS — Z7951 Long term (current) use of inhaled steroids: Secondary | ICD-10-CM | POA: Diagnosis not present

## 2018-10-05 DIAGNOSIS — G629 Polyneuropathy, unspecified: Secondary | ICD-10-CM | POA: Diagnosis present

## 2018-10-05 DIAGNOSIS — B379 Candidiasis, unspecified: Secondary | ICD-10-CM | POA: Diagnosis present

## 2018-10-05 DIAGNOSIS — D696 Thrombocytopenia, unspecified: Secondary | ICD-10-CM | POA: Diagnosis not present

## 2018-10-05 DIAGNOSIS — J9621 Acute and chronic respiratory failure with hypoxia: Secondary | ICD-10-CM | POA: Diagnosis present

## 2018-10-05 DIAGNOSIS — R05 Cough: Secondary | ICD-10-CM | POA: Diagnosis present

## 2018-10-05 LAB — URINALYSIS, ROUTINE W REFLEX MICROSCOPIC
BACTERIA UA: NONE SEEN
Bilirubin Urine: NEGATIVE
Glucose, UA: NEGATIVE mg/dL
Ketones, ur: NEGATIVE mg/dL
Nitrite: NEGATIVE
Protein, ur: NEGATIVE mg/dL
RBC / HPF: >50 RBC/hpf — ABNORMAL HIGH (ref 0–5)
Specific Gravity, Urine: 1.04 — ABNORMAL HIGH (ref 1.005–1.030)
pH: 7 (ref 5.0–8.0)

## 2018-10-05 LAB — CBC
HCT: 34.1 % — ABNORMAL LOW (ref 39.0–52.0)
Hemoglobin: 11.1 g/dL — ABNORMAL LOW (ref 13.0–17.0)
MCH: 29.7 pg (ref 26.0–34.0)
MCHC: 32.6 g/dL (ref 30.0–36.0)
MCV: 91.2 fL (ref 80.0–100.0)
Platelets: UNDETERMINED 10*3/uL (ref 150–400)
RBC: 3.74 MIL/uL — ABNORMAL LOW (ref 4.22–5.81)
RDW: 16.7 % — ABNORMAL HIGH (ref 11.5–15.5)
WBC: 10.8 10*3/uL — ABNORMAL HIGH (ref 4.0–10.5)
nRBC: 0 % (ref 0.0–0.2)

## 2018-10-05 LAB — APTT: aPTT: 26 seconds (ref 24–36)

## 2018-10-05 LAB — PROTIME-INR
INR: 1.19
PROTHROMBIN TIME: 15 s (ref 11.4–15.2)

## 2018-10-05 LAB — ECHOCARDIOGRAM COMPLETE
HEIGHTINCHES: 70 in
Weight: 2080 oz

## 2018-10-05 LAB — HEPARIN LEVEL (UNFRACTIONATED): Heparin Unfractionated: 0.47 IU/mL (ref 0.30–0.70)

## 2018-10-05 LAB — FIBRINOGEN: Fibrinogen: 392 mg/dL (ref 210–475)

## 2018-10-05 MED ORDER — PANTOPRAZOLE SODIUM 40 MG PO TBEC
40.0000 mg | DELAYED_RELEASE_TABLET | Freq: Every day | ORAL | Status: DC
  Administered 2018-10-05 – 2018-10-15 (×11): 40 mg via ORAL
  Filled 2018-10-05 (×11): qty 1

## 2018-10-05 MED ORDER — LEVALBUTEROL HCL 0.63 MG/3ML IN NEBU
0.6300 mg | INHALATION_SOLUTION | Freq: Four times a day (QID) | RESPIRATORY_TRACT | Status: DC
  Administered 2018-10-05 (×3): 0.63 mg via RESPIRATORY_TRACT
  Filled 2018-10-05 (×3): qty 3

## 2018-10-05 MED ORDER — WARFARIN SODIUM 4 MG PO TABS
4.0000 mg | ORAL_TABLET | Freq: Once | ORAL | Status: AC
  Administered 2018-10-05: 4 mg via ORAL
  Filled 2018-10-05: qty 1

## 2018-10-05 MED ORDER — SODIUM CHLORIDE 0.9 % IV SOLN
1.0000 g | INTRAVENOUS | Status: DC
  Administered 2018-10-05 – 2018-10-06 (×2): 1 g via INTRAVENOUS
  Filled 2018-10-05 (×2): qty 10

## 2018-10-05 MED ORDER — IOPAMIDOL (ISOVUE-370) INJECTION 76%
INTRAVENOUS | Status: AC
  Administered 2018-10-05: 100 mL
  Filled 2018-10-05: qty 100

## 2018-10-05 MED ORDER — WARFARIN - PHARMACIST DOSING INPATIENT
Freq: Every day | Status: DC
  Administered 2018-10-06 – 2018-10-11 (×3)

## 2018-10-05 MED ORDER — HEPARIN (PORCINE) 25000 UT/250ML-% IV SOLN
1000.0000 [IU]/h | INTRAVENOUS | Status: DC
  Administered 2018-10-05 (×2): 1000 [IU]/h via INTRAVENOUS
  Filled 2018-10-05 (×2): qty 250

## 2018-10-05 MED ORDER — HYDROCODONE-ACETAMINOPHEN 5-325 MG PO TABS
0.5000 | ORAL_TABLET | Freq: Four times a day (QID) | ORAL | Status: DC | PRN
  Administered 2018-10-05 – 2018-10-07 (×2): 1 via ORAL
  Filled 2018-10-05 (×2): qty 1

## 2018-10-05 MED ORDER — ENSURE ENLIVE PO LIQD
237.0000 mL | Freq: Two times a day (BID) | ORAL | Status: DC
  Administered 2018-10-05 – 2018-10-15 (×18): 237 mL via ORAL

## 2018-10-05 MED ORDER — WARFARIN SODIUM 5 MG PO TABS: 5.0000 mg | ORAL_TABLET | Freq: Once | ORAL | Status: DC

## 2018-10-05 MED ORDER — HEPARIN BOLUS VIA INFUSION
5000.0000 [IU] | Freq: Once | INTRAVENOUS | Status: AC
  Administered 2018-10-05: 5000 [IU] via INTRAVENOUS
  Filled 2018-10-05: qty 5000

## 2018-10-05 MED ORDER — PREDNISONE 20 MG PO TABS
40.0000 mg | ORAL_TABLET | Freq: Every day | ORAL | Status: DC
  Administered 2018-10-06: 40 mg via ORAL
  Filled 2018-10-05: qty 2

## 2018-10-05 MED ORDER — FLUCONAZOLE IN SODIUM CHLORIDE 200-0.9 MG/100ML-% IV SOLN
200.0000 mg | Freq: Once | INTRAVENOUS | Status: AC
  Administered 2018-10-05: 200 mg via INTRAVENOUS
  Filled 2018-10-05: qty 100

## 2018-10-05 NOTE — Care Management Note (Signed)
Case Management Note  Patient Details  Name: [name] [name] MRN: [id number] Date of Birth: June 08, 1941  Subjective/Objective:    From home with spouse, acute/chronic resp failure, afib, bil PE, sepsis, copd, not on any anticoagulation, awaiting benefit check for eliquis/xarelto.  Patient is active with [facility] for [place], [place], [place], will need resumption orders if appropriate at discharge.                Action/Plan: Benefit check in process for eliquis/xarelto , also patient active with [facility] for [place], [place], [place].   Expected Discharge Date:  10/05/18               Expected Discharge Plan:  [place]  In-House Referral:     Discharge planning Services  CM Consult  Post Acute Care Choice:  Resumption of Svcs/PTA Provider, Home Health Choice offered to:     DME Arranged:    DME Agency:     HH Arranged:  RN, PT, OT HH Agency:  [place]  Status of Service:  In process, will continue to follow  If discussed at Long Length of Stay Meetings, dates discussed:    Additional Comments:  [name], RN 10/05/2018, 9:34 AM

## 2018-10-05 NOTE — Progress Notes (Signed)
[facility]  Patient Status: Active (receiving services up to time of hospitalization)  [facility] is providing the following services: RN, PT and OT  If patient discharges after hours, please call [phone].   [name] [name] 10/05/2018, 9:31 AM

## 2018-10-05 NOTE — Telephone Encounter (Signed)
I called the number back provided and spoke with Dr. Tamala Julian.  [name] [name] is currently admitted to the hospital with reported diagnosis of pulmonary embolus.  Dr. Tamala Julian asked me about the patient's anticoagulation history as he also has documented atrial fibrillation.  In the past he had been on Eliquis however he stopped this due to recurring nosebleeds.  Dr. Tamala Julian indicated a 3 to 62-month treatment plan of anticoagulation.  If patient is hesitant to consider Eliquis, Xarelto would be an option, otherwise Coumadin, but neither of these would be expected to have any less bleeding risk necessarily.

## 2018-10-05 NOTE — Evaluation (Signed)
Physical Therapy Evaluation Patient Details Name: [name] [name] MRN: [id number] DOB: 18-Nov-1940 Today's Date: 10/05/2018   History of Present Illness  [name] [name] is a 78 y.o. male with medical history significant of former smoker (80 pack-year history), COPD with frequent exacerbations (oxygen and prednisone dependent), hyperlipidemia, paroxysmal atrial fibrillation presenting to the hospital for evaluation of shortness of breath.  Patient reports having chronic shortness of breath due to his COPD and uses 2 to 3 L home oxygen at all times.  States his dyspnea has been worse for the past 2 days leading up to admission, and he has been having generalized weakness. CT anfio showing bil PEs; RN cleared for activity  Clinical Impression   Pt admitted with above diagnosis. Pt currently with functional limitations due to the deficits listed below (see PT Problem List). Declining health over recent months; was ambulating with cane before most recent hospitalization before this admission; was home almost a week, and stayed in bed mostly; Presents with generalized weakness, decr functional capacity, decr activity tolerance, decr functional mobility, deconditioned; Pt and wife are realistic re: difficulty managing at home; Recommending SNF for post-acute rehab with the goal of maximizing independence and safety with mobility, prior to dc home; Pt will benefit from skilled PT to increase their independence and safety with mobility to allow discharge to the venue listed below.       Follow Up Recommendations SNF    Equipment Recommendations  Wheelchair (measurements PT)    Recommendations for Other Services       Precautions / Restrictions Precautions Precautions: Fall Precaution Comments: significantly decr functional capacity      Mobility  Bed Mobility Overal bed mobility: Needs Assistance Bed Mobility: Rolling;Sidelying to Sit Rolling: Min guard Sidelying to sit: Mod assist        General bed mobility comments: Mod assist to elelvate trunk to sit  Transfers Overall transfer level: Needs assistance Equipment used: 1 person hand held assist Transfers: Squat Pivot Transfers     Squat pivot transfers: Mod assist     General transfer comment: multimodal cues for weight shift anteriorly and mod assist for power up to clear armrest and sit to recliner  Ambulation/Gait             General Gait Details: Unable; significantly dec functional capacity  Stairs            Wheelchair Mobility    Modified Rankin (Stroke Patients Only)       Balance                                             Pertinent Vitals/Pain Pain Assessment: 0-10 Pain Score: 3  Pain Location: bilateral upper chest pain Pain Descriptors / Indicators: Aching Pain Intervention(s): Monitored during session    Home Living Family/patient expects to be discharged to:: Private residence Living Arrangements: Spouse/significant other Available Help at Discharge: Family Type of Home: House Home Access: Stairs to enter Entrance Stairs-Rails: Right;Left;Can reach both Technical brewer of Steps: 4 Home Layout: One level Home Equipment: Environmental consultant - 2 wheels;Cane - single point;Shower seat;Bedside commode(supplemental O2)      Prior Function Level of Independence: Needs assistance   Gait / Transfers Assistance Needed: with assistive device prior to recent hospitalization; family reports he got Out of bed only twice in the week leading up to this admission  ADL's /  Homemaking Assistance Needed: Assist at home; wife indicated recently it has been more and more difficult meeting ADL needs        Hand Dominance   Dominant Hand: Right    Extremity/Trunk Assessment   Upper Extremity Assessment Upper Extremity Assessment: Generalized weakness    Lower Extremity Assessment Lower Extremity Assessment: Generalized weakness       Communication    Communication: No difficulties  Cognition Arousal/Alertness: Awake/alert Behavior During Therapy: WFL for tasks assessed/performed Overall Cognitive Status: Within Functional Limits for tasks assessed                                        General Comments General comments (skin integrity, edema, etc.): Session conducted on 5 L supplemental O2; very dyspneic with limited activity    Exercises     Assessment/Plan    PT Assessment Patient needs continued PT services  PT Problem List Decreased strength;Decreased activity tolerance;Decreased balance;Decreased mobility;Decreased coordination;Decreased knowledge of use of DME;Decreased knowledge of precautions;Cardiopulmonary status limiting activity       PT Treatment Interventions DME instruction;Gait training;Functional mobility training;Therapeutic activities;Therapeutic exercise;Balance training;Patient/family education    PT Goals (Current goals can be found in the Care Plan section)  Acute Rehab PT Goals Patient Stated Goal: Be stronger to be able to manage at home PT Goal Formulation: With patient Time For Goal Achievement: 10/19/18 Potential to Achieve Goals: Fair    Frequency Min 2X/week   Barriers to discharge        Co-evaluation               AM-PAC PT "6 Clicks" Mobility  Outcome Measure Help needed turning from your back to your side while in a flat bed without using bedrails?: A Little Help needed moving from lying on your back to sitting on the side of a flat bed without using bedrails?: A Lot Help needed moving to and from a bed to a chair (including a wheelchair)?: A Lot Help needed standing up from a chair using your arms (e.g., wheelchair or bedside chair)?: A Lot Help needed to walk in hospital room?: A Lot Help needed climbing 3-5 steps with a railing? : Total 6 Click Score: 12    End of Session Equipment Utilized During Treatment: Gait belt Activity Tolerance: Patient limited by  fatigue Patient left: in chair;with call bell/phone within reach;with family/visitor present Nurse Communication: Mobility status PT Visit Diagnosis: Unsteadiness on feet (R26.81);Muscle weakness (generalized) (M62.81);Other (comment)(decr functional capacity)    Time: 1446-1534(minus approx 15 mins for med reconcile and MD) PT Time Calculation (min) (ACUTE ONLY): 48 min   Charges:   PT Evaluation $PT Eval Moderate Complexity: 1 Mod PT Treatments $Self Care/Home Management: 8-22        Roney Marion, [place] Pager [phone] Office Center 10/05/2018, 4:30 PM

## 2018-10-05 NOTE — Progress Notes (Signed)
  Echocardiogram 2D Echocardiogram has been performed.  [name] [name] 10/05/2018, 8:52 AM

## 2018-10-05 NOTE — Progress Notes (Signed)
ANTICOAGULATION CONSULT NOTE - Initial Consult  Pharmacy Consult for Heparin  Indication: atrial fibrillation and pulmonary embolus  Allergies  Allergen Reactions  . Albuterol Other (See Comments)    Continuous nebulization caused extreme tachycardia. May use the inhaler.    Patient Measurements: Height: 5\' 10"  (177.8 cm) Weight: 130 lb (59 kg) IBW/kg (Calculated) : 73 Vital Signs: Temp: 97.4 F (36.3 C) (01/21 0752) Temp Source: Oral (01/21 0401) BP: 103/68 (01/21 0752) Pulse Rate: 87 (01/21 0752)  Labs: Recent Labs    10/04/18 1756 10/05/18 0230 10/05/18 1141  HGB 13.3 11.1*  --   HCT 41.2 34.1*  --   PLT 146* PLATELET CLUMPS NOTED ON SMEAR, UNABLE TO ESTIMATE  --   APTT  --  26  --   LABPROT  --  15.0  --   INR  --  1.19  --   HEPARINUNFRC  --   --  0.47  CREATININE 1.01  --   --     Estimated Creatinine Clearance: 51.1 mL/min (by C-G formula based on SCr of 1.01 mg/dL).   Medical History: Past Medical History:  Diagnosis Date  . AAA (abdominal aortic aneurysm) ([place])   . Asthma   . COPD with emphysema ([place])    Dr. Joya Gaskins, Gold stage C  . DJD (degenerative joint disease), lumbar   . Emphysema of lung ([place])   . History of prostate cancer    Status post radioactive seed implants  . History of shingles   . Incomplete emptying of bladder   . Mixed hyperlipidemia   . PAF (paroxysmal atrial fibrillation) ([place])   . Palpitations   . Peripheral neuropathy   . Prostate cancer ([place])   . Sciatic nerve pain    Secondary to shingles 2011  . Urethral stricture   . Wears partial dentures    Upper and lower   Assessment: 78 y/o M to start heparin per pharmacy for new onset bilateral pulmonary emboli per CT angio. Pt also has hx of afib but no current anti-coagulation.   Heparin level therapeutic at 0.47 on 1000 units/hr.  H/H drop, no bleeding reported.    Goal of Therapy:  Heparin level 0.3-0.7 units/ml Monitor platelets by anticoagulation protocol: Yes    Plan:  Continue heparin gtt at 1000 units/hr Daily heparin level, CBC, s/s bleeding  Bertis Ruddy, PharmD Clinical Pharmacist Please check AMION for all [place] numbers 10/05/2018 1:08 PM

## 2018-10-05 NOTE — Progress Notes (Addendum)
ANTICOAGULATION CONSULT NOTE - Follow Up Consult  Pharmacy Consult for Coumadin Indication: atrial fibrillation and pulmonary embolus  Allergies  Allergen Reactions  . Albuterol Other (See Comments)    Continuous nebulization caused extreme tachycardia. May use the inhaler.    Patient Measurements: Height: 5\' 10"  (177.8 cm) Weight: 130 lb (59 kg) IBW/kg (Calculated) : 73  Vital Signs: Temp: 97.4 F (36.3 C) (01/21 0752) BP: 103/68 (01/21 0752) Pulse Rate: 87 (01/21 0752)  Labs: Recent Labs    10/04/18 1756 10/05/18 0230 10/05/18 1141  HGB 13.3 11.1*  --   HCT 41.2 34.1*  --   PLT 146* PLATELET CLUMPS NOTED ON SMEAR, UNABLE TO ESTIMATE  --   APTT  --  26  --   LABPROT  --  15.0  --   INR  --  1.19  --   HEPARINUNFRC  --   --  0.47  CREATININE 1.01  --   --     Estimated Creatinine Clearance: 51.1 mL/min (by C-G formula based on SCr of 1.01 mg/dL).   Medications:  Scheduled:  . acetylcysteine  2 mL Nebulization BID  . feeding supplement (ENSURE ENLIVE)  237 mL Oral BID BM  . guaiFENesin  600 mg Oral BID  . ipratropium  0.5 mg Nebulization Q6H  . levalbuterol  0.63 mg Nebulization Q6H  . pantoprazole  40 mg Oral Daily  . [START ON 10/06/2018] predniSONE  40 mg Oral Q breakfast  . pregabalin  150 mg Oral Q8H   Infusions:  . heparin 1,000 Units/hr (10/05/18 0327)    Assessment: 78 year old male known to pharmacy for IV Heparin dosing in setting of bilateral pulmonary embolism. Consulted pharmacy to start Coumadin dosing per protocol. Note patient has been on Eliquis in the past but stopped therapy due to recurring nosebleeds. Decision was made to start Coumadin therapy with plan for 3-6 months of anticoagulation for pulmonary embolism with monitoring of INR and for signs and symptoms of bleeding.   Patient remains on IV Heparin as bridge and is therapeutic on current rate of 1000 units/hr. No significant drug interactions noted on current medication list. Patient  was on antibiotics as outpatient but these have been discontinued. Hgb this AM was 11.1, Platelets 146. No bleeding reported. Baseline INR was 1.19. Last LFTs from November 2019 was within normal limits and albumin was 4.  Goal of Therapy:  INR 2-3 Heparin level 0.3-0.7 units/ml Monitor platelets by anticoagulation protocol: Yes   Plan:  Continue IV Heparin as bridge while initiating Coumadin (until INR >2 for 2 days).  Coumadin 5mg  po x1 tonight at 1800.  Daily PT/INR, Heparin level, and CBC Monitor for any signs or symptoms of bleeding   Sloan Leiter, PharmD, BCPS, BCCCP Clinical Pharmacist Please refer to [facility] for [place] numbers 10/05/2018,4:09 PM   Addendum: Physician adding one time dose of Fluconazole for concern of yeast in urine. Discussed possible colonization and wants to treat to rule out potential cause of SIRS/Sepsis. Will empirically reduce dose of Coumadin tonight to 4mg  and watch PT/INR.

## 2018-10-05 NOTE — Progress Notes (Signed)
PT Cancellation Note  Patient Details Name: [name] [name] MRN: [id number] DOB: 1941/07/27   Cancelled Treatment:    Reason Eval/Treat Not Completed: Other (comment).  Pt has newly dx'd PE and is approved by nursing to see.  However, pt is asking to wait and will try back at another time.   Ramond Dial 10/05/2018, 10:24 AM  Mee Hives, PT MS Acute Rehab Dept. Number: [place] and [place]

## 2018-10-05 NOTE — Progress Notes (Signed)
ANTICOAGULATION CONSULT NOTE - Initial Consult  Pharmacy Consult for Heparin  Indication: atrial fibrillation and pulmonary embolus  Allergies  Allergen Reactions  . Albuterol Other (See Comments)    Continuous nebulization caused extreme tachycardia. May use the inhaler.    Patient Measurements: Height: 5\' 10"  (177.8 cm) Weight: 130 lb (59 kg) IBW/kg (Calculated) : 73 Vital Signs: Temp: 97.5 F (36.4 C) (01/20 2215) Temp Source: Oral (01/20 1753) BP: 121/74 (01/20 2215) Pulse Rate: 90 (01/20 2215)  Labs: Recent Labs    10/04/18 1756  HGB 13.3  HCT 41.2  PLT 146*  CREATININE 1.01    Estimated Creatinine Clearance: 51.1 mL/min (by C-G formula based on SCr of 1.01 mg/dL).   Medical History: Past Medical History:  Diagnosis Date  . AAA (abdominal aortic aneurysm) ([place])   . Asthma   . COPD with emphysema ([place])    Dr. Joya Gaskins, Gold stage C  . DJD (degenerative joint disease), lumbar   . Emphysema of lung ([place])   . History of prostate cancer    Status post radioactive seed implants  . History of shingles   . Incomplete emptying of bladder   . Mixed hyperlipidemia   . PAF (paroxysmal atrial fibrillation) ([place])   . Palpitations   . Peripheral neuropathy   . Prostate cancer ([place])   . Sciatic nerve pain    Secondary to shingles 2011  . Urethral stricture   . Wears partial dentures    Upper and lower   Assessment: 78 y/o M to start heparin per pharmacy for new onset bilateral pulmonary emboli per CT angio. Pt also has hx of afib but no current anti-coagulation. CBC/renal function good. D-dimer 12.   Goal of Therapy:  Heparin level 0.3-0.7 units/ml Monitor platelets by anticoagulation protocol: Yes   Plan:  Heparin 5000 units BOLUS Start heparin drip at 1000 units/hr 1130 HL Daily CBC/HL Monitor for bleeding   Narda Bonds 10/05/2018,2:41 AM

## 2018-10-05 NOTE — Care Management (Signed)
#    1.   S/W  RON @   [facility] # [phone]  ELIQUIS   10 MG : NONE FORMULARY  1. ELIQUIS  2.5 MG BID COVER- YES CO-PAY- $ 47.00 TIER- 3 DRUG PRIOR APPROVAL- NO  2. ELIQUIS  5 MG BID COVER- YES CO-PAY- $ 47.00 TIER- 3 DRUG PRIOR APPROVAL- NO  3. XARELTO 15 MG BID COVER- YES CO-PAY- $ 47.00 TIER- 3 DRUG PRIOR APPROVAL- NO  4. XARELTO  20 MG BID COVER- YES CO-PAY- $V 47.00 TIER- 3 DRUG PRIOR APPROVAL- NO  PREFERRED PHARMACY : YES -  [facility]

## 2018-10-05 NOTE — Progress Notes (Addendum)
Progress Note    [name] [name]  HAL:[id number] DOB: 06-18-1941  DOA: 10/04/2018 PCP: [name], MD    Brief Narrative:   Chief complaint: Shortness of breath  Medical records reviewed and are as summarized below:  [name] [name] is an 78 y.o. male former smoker(80-pack-year), COPD, oxygen dependent on 2 L nasal cannula oxygen, chronic steroid use, PAF, and history of GI bleed; who presented with complaints of shortness of breath.  Patient initially met SIRS criteria but was found to have acute pulmonary embolus. Assessment/Plan:   Principal Problem:   Acute on chronic respiratory failure with hypoxia (HCC) Active Problems:   ATRIAL FIBRILLATION, PAROXYSMAL   Sepsis ([place])   COPD with acute exacerbation (HCC)   Thrombocytopenia (HCC)   Physical deconditioning  Acute on chronic respiratory failure with hypoxia 2/2 pulmonary embolus: Acute.  Patient presented with progressively worsening shortness of breath.  And d-dimer was noted to be elevated at 12.18.  CT angiogram of the chest showed bilateral pulmonary emboli.  Risk factors include debility as patient reported inability to ambulate and reports almost being bedbound previously at home.  Discussed case with Dr. Domenic Polite for patient's request and patient would like to be started on Coumadin.  Patient initially requiring 5 L of nasal cannula oxygen to maintain O2 sats and normally is only on 2 to 3 L at home at baseline.  Echocardiogram ordered: Did not show any acute signs of RV strain and EF noted to be 65 to 70% with grade 1 diastolic dysfunction. -Continuous pulse oximetry with nasal cannula oxygen as needed -Continue heparin drip per pharmacy -Start Coumadin bridge per pharmacy  SIRS/Sepsis: Admission white blood cell count elevated at 16.4, lactic acid 3.97 but in the setting of chronic steroid use was unclear.  Patient also had been recently on antibiotics of doxycycline for a pneumonia.  Urinalysis showed yeast and many WBCs  but no bacteria.  CT noted improving interstitial densities of the right lung base from previous CT.  Sepsis protocol has been initiated with patient given Rocephin and azithromycin.  Lactic acid resolved with IV fluids. -Follow-up blood and urine cultures -Continue Rocephin day 2 and will ultimately discontinue cultures negative in a.m.  Paroxysmal atrial fibrillation: Patient previously on Eliquis but discontinued because of bleeding ulcer bleeding while taking the medication.CHA2DS2-VASc score =3.  Patient had been on just aspirin.  He is followed by Dr. Rozann Lesches of cardiology. -Continue diltiazem  COPD, exacerbation: Acute on chronic.  Patient on chronic steroids at baseline.   Initially noted to be wheezing. - Will de-escalate from IV Solu-Medrol to prednisone 40 mg daily  Dehydration: Initial creatinine 1.01 with BUN 33.  Family notes decreased p.o. intake.  Patient received at least 2 L of normal saline IV fluids in the emergency department and had been on 125 mL/h throughout much of the day on 1/21. -Discontinued IV fluids  Deconditioning: Patient reporting being bedbound unable to ambulate without assistance prior at home.  Given significant steroid use also question the possibility of a steroid induced myopathy -PT/OT to eval and treat -Social work consult for inpatient rehab  Abnormal urinalysis, yeast infection: Patient found to have abnormal UA with budding yeast present. -Give 1 dose of Diflucan  Thrombocytopenia: Acute. Initial platelet count 146.   -Recheck CBC in a.m.    Body mass index is 18.65 kg/m.   Family Communication/Anticipated D/C date and plan/Code Status   GI prophylaxis: Protonix started DVT prophylaxis: Heparin bridging to Coumadin  Code Status: Partial code.  Family Communication: Discussed plan of care with the patient family present at bedside Disposition Plan: To be determined   Medical Consultants:    Dr. Rozann Lesches,  cardiology   Anti-Infectives:    Rocephin day 2  Subjective:   Patient reports that he has been basically bedbound since leaving the hospital last despite having at home PT, OT, and RN.   Objective:    Vitals:   10/05/18 0401 10/05/18 0752 10/05/18 0925 10/05/18 1406  BP: 127/78 103/68    Pulse: 88 87    Resp:  12    Temp: 97.6 F (36.4 C) (!) 97.4 F (36.3 C)    TempSrc: Oral     SpO2:  100% 98% 99%  Weight:      Height:        Intake/Output Summary (Last 24 hours) at 10/05/2018 1432 Last data filed at 10/05/2018 8144 Gross per 24 hour  Intake 3351.72 ml  Output 630 ml  Net 2721.72 ml   Filed Weights   10/04/18 1739  Weight: 59 kg    Exam: Constitutional: Elderly male who appears to be fatigued but in no acute distress Eyes: PERRL, lids and conjunctivae normal ENMT: Mucous membranes are moist. Posterior pharynx clear of any exudate or lesions.   Neck: normal, supple, no masses, no thyromegaly Respiratory: Decreased aeration, but no wheezes, crackles, or rhonchi appreciated at this time.  Patient able to talk in complete sentences. Cardiovascular: Regular rate and rhythm, no murmurs / rubs / gallops. No lower e extremity edema. 2+ pedal pulses. No carotid bruits.  Abdomen: no tenderness, no masses palpated. No hepatosplenomegaly. Bowel sounds positive.  Musculoskeletal: no clubbing / cyanosis. No joint deformity upper and lower extremities. Good ROM, no contractures.  Poor muscle tone.  Skin: no rashes, lesions, ulcers. No induration Neurologic: CN 2-12 grossly intact. Sensation intact, DTR normal. Strength 4/5 in all 4.  Psychiatric: Normal judgment and insight. Alert and oriented x 3.  Depressed mood.    Data Reviewed:   I have personally reviewed following labs and imaging studies:  Labs: Labs show the following:   Basic Metabolic Panel: Recent Labs  Lab 10/04/18 1756  NA 138  K 4.3  CL 103  CO2 23  GLUCOSE 251*  BUN 33*  CREATININE 1.01   CALCIUM 8.8*   GFR Estimated Creatinine Clearance: 51.1 mL/min (by C-G formula based on SCr of 1.01 mg/dL). Liver Function Tests: No results for input(s): AST, ALT, ALKPHOS, BILITOT, PROT, ALBUMIN in the last 168 hours. No results for input(s): LIPASE, AMYLASE in the last 168 hours. No results for input(s): AMMONIA in the last 168 hours. Coagulation profile Recent Labs  Lab 10/05/18 0230  INR 1.19    CBC: Recent Labs  Lab 10/04/18 1756 10/05/18 0230  WBC 16.4* 10.8*  HGB 13.3 11.1*  HCT 41.2 34.1*  MCV 93.0 91.2  PLT 146* PLATELET CLUMPS NOTED ON SMEAR, UNABLE TO ESTIMATE   Cardiac Enzymes: No results for input(s): CKTOTAL, CKMB, CKMBINDEX, TROPONINI in the last 168 hours. BNP (last 3 results) No results for input(s): PROBNP in the last 8760 hours. CBG: No results for input(s): GLUCAP in the last 168 hours. D-Dimer: Recent Labs    10/04/18 2203  DDIMER 12.18*   Hgb A1c: No results for input(s): HGBA1C in the last 72 hours. Lipid Profile: No results for input(s): CHOL, HDL, LDLCALC, TRIG, CHOLHDL, LDLDIRECT in the last 72 hours. Thyroid function studies: No results for input(s): TSH, T4TOTAL,  T3FREE, THYROIDAB in the last 72 hours.  Invalid input(s): FREET3 Anemia work up: No results for input(s): VITAMINB12, FOLATE, FERRITIN, TIBC, IRON, RETICCTPCT in the last 72 hours. Sepsis Labs: Recent Labs  Lab 10/04/18 1756 10/04/18 1822 10/04/18 2050 10/04/18 2139 10/05/18 0230  PROCALCITON  --   --   --  0.11  --   WBC 16.4*  --   --   --  10.8*  LATICACIDVEN  --  3.97* 1.66  --   --     Microbiology Recent Results (from the past 240 hour(s))  Blood culture (routine x 2)     Status: None (Preliminary result)   Collection Time: 10/04/18  6:50 PM  Result Value Ref Range Status   Specimen Description BLOOD RIGHT ANTECUBITAL  Final   Special Requests   Final    BOTTLES DRAWN AEROBIC AND ANAEROBIC Blood Culture adequate volume   Culture   Final    NO GROWTH <  24 HOURS Performed at [facility], [place] [address], [place], [place] [address]    Report Status PENDING  Incomplete  Blood culture (routine x 2)     Status: None (Preliminary result)   Collection Time: 10/04/18  6:50 PM  Result Value Ref Range Status   Specimen Description BLOOD BLOOD LEFT FOREARM  Final   Special Requests   Final    BOTTLES DRAWN AEROBIC AND ANAEROBIC Blood Culture adequate volume   Culture   Final    NO GROWTH < 24 HOURS Performed at [facility], [place] [address] [place], [place] [address]    Report Status PENDING  Incomplete    Procedures and diagnostic studies:  Echocardiogram: 10/05/2018 EF noted to be 65-70% with grade 1 diastolic dysfunction, and elevated LV filling pressures.    Ct Angio Chest Pe W Or Wo Contrast  Result Date: 10/05/2018 CLINICAL DATA:  78 year old male with chronic shortness of breath and intermittent chest pain. EXAM: CT ANGIOGRAPHY CHEST WITH CONTRAST TECHNIQUE: Multidetector CT imaging of the chest was performed using the standard protocol during bolus administration of intravenous contrast. Multiplanar CT image reconstructions and MIPs were obtained to evaluate the vascular anatomy. CONTRAST:  180m ISOVUE-370 IOPAMIDOL (ISOVUE-370) INJECTION 76% COMPARISON:  Chest radiograph dated 10/04/2018 and CT dated 09/13/2018 FINDINGS: Evaluation of this exam is limited due to respiratory motion artifact. Cardiovascular: There is no cardiomegaly. There is multi vessel coronary vascular calcification. Small loculated appearing pericardial fluid anterior to the heart measures 11 mm in greatest thickness. There is moderate atherosclerotic calcification of the thoracic aorta. Small right upper lobe segmental, left lower lobe subsegmental, left upper lobe segmental branch pulmonary artery emboli noted. There is nonocclusive segmental and subsegmental thrombus in the lingula. No definite CT evidence of right heart straining. Mediastinum/Nodes:  Top-normal bilateral hilar lymph nodes measure 10 mm in the right hilum. Esophagus and the thyroid gland are grossly unremarkable. No mediastinal fluid collection. Lungs/Pleura: There is a background of emphysema. Right lung base subpleural and interstitial densities overall improved since the prior CT and may represent scarring although recurrent or residual infiltrate is not excluded. Clinical correlation is recommended. There is a 7 mm left lower lobe subpleural nodule similar to prior CT. An additional 6 mm right upper lobe subpleural nodule also similar to prior CT. A previously described subsolid nodule in the right upper lobe/right apical region appears to correlate with an area of bronchiectasis and likely represented impacted mucous within a dilated distal bronchial. Mucus secretions noted in the  left and right mainstem bronchi. There is no pleural effusion or pneumothorax. Upper Abdomen: Scattered calcified splenic granuloma. Multiple stones within the gallbladder. Partially visualized 5.5 cm hypodense lesion in the upper pole of the left kidney, incompletely characterized. This can be further evaluated with ultrasound on a nonemergent basis. Musculoskeletal: T6 compression fracture similar to prior CT. No acute fracture. Osteopenia. Review of the MIP images confirms the above findings. IMPRESSION: 1. Bilateral segmental and subsegmental pulmonary artery emboli. No definite CT evidence of right heart straining. 2. Emphysema. Right lung base subpleural and interstitial densities overall improved since the prior CT and may represent scarring although recurrent or residual infiltrate is not excluded. Clinical correlation is recommended. 3. Multiple bilateral pulmonary nodules. Follow-up as per recommendation of the prior CT. 4. Cholelithiasis. These results were called by telephone at the time of interpretation on 10/05/2018 at 1:52 am to nurse Truman Hayward, who verbally acknowledged these results. Electronically  Signed   By: Anner Crete M.D.   On: 10/05/2018 02:03   Dg Chest Port 1 View  Result Date: 10/04/2018 CLINICAL DATA:  78 year old male with deep productive cough and shortness breath for 24 hours. History of COPD. Recent hospitalization for right lower lobe pneumonia. Subsequent encounter. EXAM: PORTABLE CHEST 1 VIEW COMPARISON:  09/23/2018 chest x-ray.  09/13/2018 chest CT. FINDINGS: No infiltrate, congestive heart failure or pneumothorax noted. Emphysematous changes. CT detected right upper lobe solid/sub solid nodule not appreciated on present plain film exam. Heart size within normal limits.  Calcified aorta. IMPRESSION: 1. No infiltrate, congestive heart failure or pneumothorax noted. 2. 09/13/2018 CT detected right upper lobe solid/sub solid nodule not appreciated on present plain film exam exam. Follow-up CT as previously recommended (3 months from 09/13/2018). 3.  Emphysema (ICD10-J43.9). Electronically Signed   By: Genia Del M.D.   On: 10/04/2018 18:31    Medications:   . acetylcysteine  2 mL Nebulization BID  . feeding supplement (ENSURE ENLIVE)  237 mL Oral BID BM  . guaiFENesin  600 mg Oral BID  . ipratropium  0.5 mg Nebulization Q6H  . levalbuterol  0.63 mg Nebulization Q6H  . methylPREDNISolone (SOLU-MEDROL) injection  60 mg Intravenous Q8H  . pregabalin  150 mg Oral Q8H   Continuous Infusions: . cefTRIAXone (ROCEPHIN)  IV    . fluconazole (DIFLUCAN) IV    . heparin 1,000 Units/hr (10/05/18 1600)     LOS: 0 days   Amond Speranza A Zaraya Delauder  [facility]   *Please refer to [email or web address], password [email or web address] to get updated schedule on who will round on this patient, as hospitalists switch teams weekly. If 7PM-7AM, please contact night-coverage at [email or web address], password [email or web address] for any overnight needs.

## 2018-10-05 NOTE — Telephone Encounter (Signed)
Received telephone call from Dr. Lorrine Kin at [facility] requesting to speak with Dr. Domenic Polite in regards to patient Nickoles Gregori.  Please call [phone].

## 2018-10-06 LAB — CBC
HCT: 34.4 % — ABNORMAL LOW (ref 39.0–52.0)
Hemoglobin: 11.1 g/dL — ABNORMAL LOW (ref 13.0–17.0)
MCH: 29.5 pg (ref 26.0–34.0)
MCHC: 32.3 g/dL (ref 30.0–36.0)
MCV: 91.5 fL (ref 80.0–100.0)
Platelets: 137 10*3/uL — ABNORMAL LOW (ref 150–400)
RBC: 3.76 MIL/uL — AB (ref 4.22–5.81)
RDW: 16.9 % — ABNORMAL HIGH (ref 11.5–15.5)
WBC: 13.9 10*3/uL — ABNORMAL HIGH (ref 4.0–10.5)
nRBC: 0 % (ref 0.0–0.2)

## 2018-10-06 LAB — URINE CULTURE: Culture: 60000 — AB

## 2018-10-06 LAB — BASIC METABOLIC PANEL
Anion gap: 6 (ref 5–15)
BUN: 30 mg/dL — ABNORMAL HIGH (ref 8–23)
CO2: 23 mmol/L (ref 22–32)
Calcium: 8.5 mg/dL — ABNORMAL LOW (ref 8.9–10.3)
Chloride: 113 mmol/L — ABNORMAL HIGH (ref 98–111)
Creatinine, Ser: 0.63 mg/dL (ref 0.61–1.24)
GFR calc Af Amer: >60 mL/min (ref >60)
Glucose, Bld: 155 mg/dL — ABNORMAL HIGH (ref 70–99)
Potassium: 3.5 mmol/L (ref 3.5–5.1)
SODIUM: 142 mmol/L (ref 135–145)

## 2018-10-06 LAB — PROTIME-INR
INR: 1.25
Prothrombin Time: 15.6 seconds — ABNORMAL HIGH (ref 11.4–15.2)

## 2018-10-06 LAB — HEPARIN LEVEL (UNFRACTIONATED): Heparin Unfractionated: 0.52 IU/mL (ref 0.30–0.70)

## 2018-10-06 MED ORDER — SODIUM CHLORIDE 0.9% FLUSH
10.0000 mL | Freq: Two times a day (BID) | INTRAVENOUS | Status: DC
  Administered 2018-10-06 – 2018-10-15 (×19): 10 mL

## 2018-10-06 MED ORDER — DILTIAZEM HCL ER 120 MG PO CP24: 120.0000 mg | ORAL_CAPSULE | ORAL | Status: DC

## 2018-10-06 MED ORDER — MORPHINE SULFATE (PF) 2 MG/ML IV SOLN
2.0000 mg | INTRAVENOUS | Status: DC | PRN
  Administered 2018-10-06 – 2018-10-07 (×5): 2 mg via INTRAVENOUS
  Filled 2018-10-06 (×5): qty 1

## 2018-10-06 MED ORDER — WARFARIN SODIUM 5 MG PO TABS
5.0000 mg | ORAL_TABLET | Freq: Once | ORAL | Status: AC
  Administered 2018-10-06: 5 mg via ORAL
  Filled 2018-10-06: qty 1

## 2018-10-06 MED ORDER — LORAZEPAM 0.5 MG PO TABS
0.5000 mg | ORAL_TABLET | Freq: Two times a day (BID) | ORAL | Status: DC | PRN
  Administered 2018-10-10: 0.5 mg via ORAL
  Filled 2018-10-06: qty 1

## 2018-10-06 MED ORDER — DILTIAZEM HCL ER COATED BEADS 240 MG PO CP24
240.0000 mg | ORAL_CAPSULE | Freq: Every morning | ORAL | Status: DC
  Administered 2018-10-07 – 2018-10-12 (×6): 240 mg via ORAL
  Filled 2018-10-06 (×8): qty 1

## 2018-10-06 MED ORDER — SODIUM CHLORIDE 0.9% FLUSH
10.0000 mL | INTRAVENOUS | Status: DC | PRN
  Administered 2018-10-12 (×4): 10 mL
  Filled 2018-10-06 (×4): qty 40

## 2018-10-06 MED ORDER — ENOXAPARIN SODIUM 60 MG/0.6ML ~~LOC~~ SOLN: 1.0000 mg/kg | Freq: Two times a day (BID) | SUBCUTANEOUS | Status: DC

## 2018-10-06 MED ORDER — DILTIAZEM HCL ER COATED BEADS 120 MG PO CP24
120.0000 mg | ORAL_CAPSULE | Freq: Every evening | ORAL | Status: DC
  Administered 2018-10-06 – 2018-10-12 (×7): 120 mg via ORAL
  Filled 2018-10-06 (×8): qty 1

## 2018-10-06 MED ORDER — DILTIAZEM HCL ER 120 MG PO CP24
120.0000 mg | ORAL_CAPSULE | Freq: Two times a day (BID) | ORAL | Status: DC
  Administered 2018-10-06: 120 mg via ORAL
  Filled 2018-10-06: qty 2
  Filled 2018-10-06: qty 1

## 2018-10-06 MED ORDER — NITROGLYCERIN 0.4 MG SL SUBL: 0.4000 mg | SUBLINGUAL_TABLET | SUBLINGUAL | Status: DC | PRN

## 2018-10-06 MED ORDER — METHYLPREDNISOLONE SODIUM SUCC 40 MG IJ SOLR
40.0000 mg | Freq: Four times a day (QID) | INTRAMUSCULAR | Status: DC
  Administered 2018-10-06 – 2018-10-07 (×4): 40 mg via INTRAVENOUS
  Filled 2018-10-06 (×4): qty 1

## 2018-10-06 MED ORDER — MOMETASONE FURO-FORMOTEROL FUM 200-5 MCG/ACT IN AERO
2.0000 | INHALATION_SPRAY | Freq: Two times a day (BID) | RESPIRATORY_TRACT | Status: DC
  Administered 2018-10-06 – 2018-10-15 (×19): 2 via RESPIRATORY_TRACT
  Filled 2018-10-06: qty 8.8

## 2018-10-06 MED ORDER — ASPIRIN 81 MG PO CHEW
324.0000 mg | CHEWABLE_TABLET | Freq: Once | ORAL | Status: AC
  Administered 2018-10-06: 324 mg via ORAL
  Filled 2018-10-06: qty 4

## 2018-10-06 MED ORDER — IPRATROPIUM BROMIDE 0.02 % IN SOLN
0.5000 mg | Freq: Three times a day (TID) | RESPIRATORY_TRACT | Status: DC
  Administered 2018-10-06 – 2018-10-15 (×28): 0.5 mg via RESPIRATORY_TRACT
  Filled 2018-10-06 (×29): qty 2.5

## 2018-10-06 MED ORDER — LEVALBUTEROL HCL 0.63 MG/3ML IN NEBU
0.6300 mg | INHALATION_SOLUTION | Freq: Three times a day (TID) | RESPIRATORY_TRACT | Status: DC
  Administered 2018-10-06 – 2018-10-15 (×28): 0.63 mg via RESPIRATORY_TRACT
  Filled 2018-10-06 (×29): qty 3

## 2018-10-06 MED ORDER — HEPARIN (PORCINE) 25000 UT/250ML-% IV SOLN
1000.0000 [IU]/h | INTRAVENOUS | Status: DC
  Administered 2018-10-06: 1000 [IU]/h via INTRAVENOUS
  Filled 2018-10-06: qty 250

## 2018-10-06 NOTE — Progress Notes (Addendum)
ANTICOAGULATION CONSULT NOTE - Follow Up Consult  Pharmacy Consult for Coumadin Indication: atrial fibrillation and pulmonary embolus  Allergies  Allergen Reactions  . Albuterol Other (See Comments)    Continuous nebulization caused extreme tachycardia. May use the inhaler.    Patient Measurements: Height: 5\' 10"  (177.8 cm) Weight: 130 lb (59 kg) IBW/kg (Calculated) : 73  Vital Signs: Temp: 98.1 F (36.7 C) (01/22 0731) Temp Source: Oral (01/22 0731) BP: 129/78 (01/22 0731) Pulse Rate: 104 (01/22 0731)  Labs: Recent Labs    10/04/18 1756 10/05/18 0230 10/05/18 1141 10/06/18 0526  HGB 13.3 11.1*  --  11.1*  HCT 41.2 34.1*  --  34.4*  PLT 146* PLATELET CLUMPS NOTED ON SMEAR, UNABLE TO ESTIMATE  --  137*  APTT  --  26  --   --   LABPROT  --  15.0  --  15.6*  INR  --  1.19  --  1.25  HEPARINUNFRC  --   --  0.47 0.52  CREATININE 1.01  --   --  0.63    Estimated Creatinine Clearance: 64.5 mL/min (by C-G formula based on SCr of 0.63 mg/dL).   Medications:  Scheduled:  . acetylcysteine  2 mL Nebulization BID  . feeding supplement (ENSURE ENLIVE)  237 mL Oral BID BM  . guaiFENesin  600 mg Oral BID  . ipratropium  0.5 mg Nebulization TID  . levalbuterol  0.63 mg Nebulization TID  . pantoprazole  40 mg Oral Daily  . predniSONE  40 mg Oral Q breakfast  . pregabalin  150 mg Oral Q8H  . Warfarin - Pharmacist Dosing Inpatient   Does not apply q1800   Infusions:  . cefTRIAXone (ROCEPHIN)  IV Stopped (10/05/18 2033)  . heparin 1,000 Units/hr (10/05/18 2309)    Assessment: 78 year old male known to pharmacy for IV Heparin dosing in setting of bilateral pulmonary embolism. Consulted pharmacy to start Coumadin dosing per protocol. Note patient has been on Eliquis in the past but stopped therapy due to recurring nosebleeds. Decision was made to start Coumadin therapy with plan for 3-6 months of anticoagulation for pulmonary embolism with monitoring of INR and for signs and  symptoms of bleeding.   Heparin level remains therapeutic at 0.52, and INR subtherapeutic at 1.25 s/p starting coumadin yesterday (4mg  d/t fluconazole one time dose).  CBC low but stable, plts 137.    Goal of Therapy:  INR 2-3 Heparin level 0.3-0.7 units/ml Monitor platelets by anticoagulation protocol: Yes   Plan:  Continue IV Heparin as bridge at 1000 units/hr while initiating Coumadin (until INR >2 for 2 days).  Coumadin 5mg  PO x 1 tonight Daily INR, heparin level, CBC, s/s bleeding  Bertis Ruddy, PharmD Clinical Pharmacist Please check AMION for all [place] numbers 10/06/2018 7:46 AM

## 2018-10-06 NOTE — Progress Notes (Signed)
Occupational Therapy Evaluation Patient Details Name: [name] [name] MRN: [id number] DOB: August 14, 1941 Today's Date: 10/06/2018    History of Present Illness [name] [name] is a 78 y.o. male with medical history significant of former smoker (80 pack-year history), COPD with frequent exacerbations (oxygen and prednisone dependent), hyperlipidemia, paroxysmal atrial fibrillation presenting to the hospital for evaluation of shortness of breath.  Patient reports having chronic shortness of breath due to his COPD and uses 2 to 3 L home oxygen at all times.  States his dyspnea has been worse for the past 2 days leading up to admission, and he has been having generalized weakness. CT anfio showing bil PEs; RN cleared for activity   Clinical Impression   PTA, pt was living at home with his wife who provides assistance with ADL/IADL, pt reports functional mobility with spc about 2 months PTA. Pt reports getting weaker leading up to this hospitalization and states his wife is having increased difficulty assisting him with transfers. Upon arrival pt on RA SpO2 87%, administered 2lnc SpO2 95% after 8min pursed lip breathing, nsg and physician aware. Pt's involvement this session limited due to fatigue. Pt demonstrates decreased activity tolerance, generalized weakness, and gross deconditioning limiting his independence with ADL and functional mobility. Pt is motivated to get stronger and progress toward established OT goals. Pt will continue to benefit from skilled OT services to maximize independence and safety to allow d/c to SNF prior to returning home. Will continue to see acutely.   HR 125 at rest reclined in bed HR 129-130 grooming while seated upright in bed  BP 117/70    Follow Up Recommendations  SNF;Supervision/Assistance - 24 hour    Equipment Recommendations  Other (comment)(defer to next venue)    Recommendations for Other Services PT consult     Precautions / Restrictions  Precautions Precautions: Fall Precaution Comments: significantly decr functional capacity Restrictions Weight Bearing Restrictions: No      Mobility Bed Mobility Overal bed mobility: Needs Assistance             General bed mobility comments: pt declined progression to EOB;pt assisted with repositioning in bed (pulling with UE, pushing with LE) with minA  Transfers                 General transfer comment: pt declined OOB mobiltiy due to fatigue    Balance                                           ADL either performed or assessed with clinical judgement   ADL Overall ADL's : Needs assistance/impaired Eating/Feeding: Set up;Sitting Eating/Feeding Details (indicate cue type and reason): pt reported difficulty with breakfast this date due to increased fatigue Grooming: Wash/dry face;Sitting;Bed level;Set up Grooming Details (indicate cue type and reason): pt completed grooming while sitting upright in bed, demonstrated increased fatigue Upper Body Bathing: Moderate assistance;Sitting   Lower Body Bathing: Total assistance;Sitting/lateral leans   Upper Body Dressing : Maximal assistance;Sitting   Lower Body Dressing: Total assistance;Sitting/lateral leans                 General ADL Comments: participation limited due to increased fatigue;pt decline progression to EOB     Vision         Perception     Praxis      Pertinent Vitals/Pain Pain Assessment: 0-10 Pain Score: 4  Pain Location: bilateral upper chest pain Pain Descriptors / Indicators: Aching Pain Intervention(s): Limited activity within patient's tolerance;Monitored during session     Hand Dominance Right   Extremity/Trunk Assessment Upper Extremity Assessment Upper Extremity Assessment: Generalized weakness   Lower Extremity Assessment Lower Extremity Assessment: Defer to PT evaluation       Communication Communication Communication: No difficulties    Cognition Arousal/Alertness: Awake/alert Behavior During Therapy: WFL for tasks assessed/performed Overall Cognitive Status: Within Functional Limits for tasks assessed                                     General Comments       Exercises     Shoulder Instructions      Home Living Family/patient expects to be discharged to:: Private residence Living Arrangements: Spouse/significant other Available Help at Discharge: Family Type of Home: House Home Access: Stairs to enter Technical brewer of Steps: 4 Entrance Stairs-Rails: Right;Left;Can reach both Home Layout: One level     Bathroom Shower/Tub: Tub/shower unit;Walk-in shower   Bathroom Toilet: Standard     Home Equipment: Environmental consultant - 2 wheels;Cane - single point;Shower seat;Bedside commode(supplemental O2)          Prior Functioning/Environment Level of Independence: Needs assistance  Gait / Transfers Assistance Needed: with assistive device prior to recent hospitalization; family reports he got Out of bed only twice in the week leading up to this admission ADL's / Homemaking Assistance Needed: Assist at home; wife indicated recently it has been more and more difficult meeting ADL needs;pt reports his wife has increased difficulty transferring pt to [facility]            OT Problem List: Decreased strength;Decreased activity tolerance;Impaired balance (sitting and/or standing);Decreased knowledge of use of DME or AE;Cardiopulmonary status limiting activity;Pain      OT Treatment/Interventions: Self-care/ADL training;Therapeutic exercise;Energy conservation;DME and/or AE instruction;Therapeutic activities;Patient/family education;Balance training    OT Goals(Current goals can be found in the care plan section) Acute Rehab OT Goals Patient Stated Goal: To get back on his feet and do things for himself OT Goal Formulation: With patient Time For Goal Achievement: 10/20/18 Potential to Achieve Goals: Good   OT Frequency: Min 2X/week   Barriers to D/C: Decreased caregiver support  pt reports wife is having increased difficulty assisting pt       Co-evaluation              AM-PAC OT "6 Clicks" Daily Activity     Outcome Measure Help from another person eating meals?: A Little Help from another person taking care of personal grooming?: A Little Help from another person toileting, which includes using toliet, bedpan, or urinal?: Total Help from another person bathing (including washing, rinsing, drying)?: A Lot Help from another person to put on and taking off regular upper body clothing?: A Lot Help from another person to put on and taking off regular lower body clothing?: Total 6 Click Score: 12   End of Session Equipment Utilized During Treatment: Oxygen(2lnc) Nurse Communication: Mobility status;Other (comment)(desaturation on RA need for O2, elevated HR)  Activity Tolerance: Patient limited by fatigue Patient left: in bed;with nursing/sitter in room;with call bell/phone within reach  OT Visit Diagnosis: Unsteadiness on feet (R26.81);Other abnormalities of gait and mobility (R26.89);Muscle weakness (generalized) (M62.81);Pain Pain - part of body: (bilateral chest)                Time: 1950-9326  OT Time Calculation (min): 28 min Charges:  OT General Charges $OT Visit: 1 Visit OT Evaluation $OT Eval Moderate Complexity: 1 Mod OT Treatments $Self Care/Home Management : 8-22 mins  Dorinda Hill OTR/L Acute Rehabilitation Services Office: [place] 10/06/2018, 11:09 AM

## 2018-10-06 NOTE — Progress Notes (Signed)
PROGRESS NOTE  JULIE PAOLINI OZY:[id number] DOB: 1941/05/22 DOA: 10/04/2018 PCP: [name], MD  Brief History   78 year old man former smoker PMH COPD with frequent exacerbations, oxygen and prednisone dependent, presented with shortness of breath, of note recently hospitalized at [facility] for pneumonia and COPD exacerbation and discharged on doxycycline, liquid morphine for breathing and steroid who presented with shortness of breath and was admitted for acute on chronic hypoxic respiratory failure secondary to COPD exacerbation.  A & P  Acute bilateral pulmonary embolism with acute on chronic hypoxic respiratory failure.  No evidence of RV strain by CT or echocardiogram.  Most likely precipitated by near bedbound status. --Continue warfarin as per patient request.  Continue heparin. --Wean oxygen as tolerated down to baseline requirement of 2-3 L  COPD exacerbation, chronic steroid dependency, chronic hypoxic respiratory failure --Somewhat worse today.  Start back IV steroids.  Continue bronchodilators.  Continue supplemental oxygen.  Thrombocytopenia --Present on admission.  Somewhat lower today.  Likely related to acute PE.  Follow clinically. --Repeat CBC in a.m.  Sepsis considered on admission with elevated lactic acid and recent treatment for pneumonia however CT showed improving interstitial densities in the right lung  --Cannot completely rule out residual pneumonia from recent admission, will continue empiric ceftriaxone.  Paroxysmal atrial fibrillation.  Eliquis previously discontinued secondary to epistaxis. --Per Dr. Tamala Julian and Dr. Domenic Polite patient will be on warfarin, continue diltiazem  History of anginal chest pain as recorded by Dr. Domenic Polite 07/2018.  Reassuring Myoview in August. --Continue aspirin, nitroglycerin as needed.  Generalized debility --PT/OT   Still quite ill, short of breath and symptomatic.  DVT prophylaxis: heparin, warfarin Code Status:  partial Family Communication: none Disposition Plan: SNF per PT, OT    [name], MD  [facility] Direct contact: see [email or web address]  7PM-7AM contact night coverage as above 10/06/2018, 8:01 PM  LOS: 1 day   Consultants  .   Procedures  . Echo Study Conclusions  - Procedure narrative: Transthoracic echocardiography. Image   quality was adequate. The study was technically difficult. - Left ventricle: The cavity size was normal. Wall thickness was   increased in a pattern of mild LVH. Systolic function was   vigorous. The estimated ejection fraction was in the range of 65%   to 70%. Wall motion was normal; there were no regional wall   motion abnormalities. Doppler parameters are consistent with   abnormal left ventricular relaxation (grade 1 diastolic   dysfunction). The E/e&' ratio is between 8-15, suggesting elevated   LV filling pressure. - Mitral valve: Mildly thickened leaflets . There was trivial   regurgitation. - Left atrium: The atrium was normal in size. - Tricuspid valve: There was trivial regurgitation. - Pulmonary arteries: PA peak pressure: 36 mm Hg (S). - Inferior vena cava: The vessel was normal in size. The   respirophasic diameter changes were in the normal range (>= 50%),   consistent with normal central venous pressure.  Impressions:  - Technically difficult study. Hyperdynamic LV. Probably no   signifcant change compared to prior study in 11/2017.  Antibiotics  .   Interval History/Subjective  Feels short of breath at the moment, no nausea or vomiting, tolerating diet.  Per nursing was off oxygen this morning with SPO2 in the 80s.  Patient reports he is chronically on 2 L.  Objective   Vitals:  Vitals:   10/06/18 1632 10/06/18 1959  BP: 131/78   Pulse: 79   Resp:    Temp:  98 F (36.7 C)   SpO2: 94% 94%    Exam:  Constitutional:  . Appears anxious, uncomfortable, short of breath but not toxic. Eyes:  . pupils and irises  appear normal ENMT:  . grossly normal hearing  . Tongue appears dry Respiratory:  . CTA bilaterally, fair air movement, no frank wheezes, rales or rhonchi. Marland Kitchen Respiratory effort moderately increased, tachypneic, dyspneic. Cardiovascular:  . Tachycardic, regular rhythm, no m/r/g . Sinus tachycardia on telemetry . No LE extremity edema   Abdomen:  . Soft, nontender, nondistended Musculoskeletal:  . RLE, LLE   . Moves both legs to command Psychiatric:  . Mental status o Mood, affect appropriate . judgment and insight difficult to judge at the moment   I have personally reviewed the following:   Today's Data  . BMP unremarkable . WBC 13.9 on steroids, hemoglobin stable 11.1, platelets somewhat lower 137.  Lab Data  . noted  Micro Data  . noted  Imaging  . CXR and CTA chest noted  Cardiology Data  . Sinus tachycardia, nonspecific ST changes  Other Data  .   Scheduled Meds: . acetylcysteine  2 mL Nebulization BID  . diltiazem  120 mg Oral QPM  . [START ON 10/07/2018] diltiazem  240 mg Oral q morning - 10a  . feeding supplement (ENSURE ENLIVE)  237 mL Oral BID BM  . guaiFENesin  600 mg Oral BID  . ipratropium  0.5 mg Nebulization TID  . levalbuterol  0.63 mg Nebulization TID  . methylPREDNISolone (SOLU-MEDROL) injection  40 mg Intravenous Q6H  . mometasone-formoterol  2 puff Inhalation BID  . pantoprazole  40 mg Oral Daily  . pregabalin  150 mg Oral Q8H  . sodium chloride flush  10-40 mL Intracatheter Q12H  . Warfarin - Pharmacist Dosing Inpatient   Does not apply q1800   Continuous Infusions: . cefTRIAXone (ROCEPHIN)  IV 1 g (10/06/18 1714)  . heparin      Principal Problem:   Acute on chronic respiratory failure with hypoxia (HCC) Active Problems:   ATRIAL FIBRILLATION, PAROXYSMAL   COPD with acute exacerbation (HCC)   Thrombocytopenia (HCC)   Physical deconditioning   Pulmonary embolus ([place])   LOS: 1 day

## 2018-10-06 NOTE — Discharge Instructions (Signed)
Information on my medicine - Coumadin   (Warfarin)  This medication education was reviewed with me or my healthcare representative as part of my discharge preparation.  The pharmacist that spoke with me during my hospital stay was:  Bertis Ruddy, [facility]  Why was Coumadin prescribed for you? Coumadin was prescribed for you because you have a blood clot or a medical condition that can cause an increased risk of forming blood clots. Blood clots can cause serious health problems by blocking the flow of blood to the heart, lung, or brain. Coumadin can prevent harmful blood clots from forming. As a reminder your indication for Coumadin is:   Pulmonary Embolism Treatment  What test will check on my response to Coumadin? While on Coumadin (warfarin) you will need to have an INR test regularly to ensure that your dose is keeping you in the desired range. The INR (international normalized ratio) number is calculated from the result of the laboratory test called prothrombin time (PT).  If an INR APPOINTMENT HAS NOT ALREADY BEEN MADE FOR YOU please schedule an appointment to have this lab work done by your health care provider within 7 days. Your INR goal is usually a number between:  2 to 3 or your provider may give you a more narrow range like 2-2.5.  Ask your health care provider during an office visit what your goal INR is.  What  do you need to  know  About  COUMADIN? Take Coumadin (warfarin) exactly as prescribed by your healthcare provider about the same time each day.  DO NOT stop taking without talking to the doctor who prescribed the medication.  Stopping without other blood clot prevention medication to take the place of Coumadin may increase your risk of developing a new clot or stroke.  Get refills before you run out.  What do you do if you miss a dose? If you miss a dose, take it as soon as you remember on the same day then continue your regularly scheduled regimen the next day.  Do not take two  doses of Coumadin at the same time.  Important Safety Information A possible side effect of Coumadin (Warfarin) is an increased risk of bleeding. You should call your healthcare provider right away if you experience any of the following: ? Bleeding from an injury or your nose that does not stop. ? Unusual colored urine (red or dark brown) or unusual colored stools (red or black). ? Unusual bruising for unknown reasons. ? A serious fall or if you hit your head (even if there is no bleeding).  Some foods or medicines interact with Coumadin (warfarin) and might alter your response to warfarin. To help avoid this: ? Eat a balanced diet, maintaining a consistent amount of Vitamin K. ? Notify your provider about major diet changes you plan to make. ? Avoid alcohol or limit your intake to 1 drink for women and 2 drinks for men per day. (1 drink is 5 oz. wine, 12 oz. beer, or 1.5 oz. liquor.)  Make sure that ANY health care provider who prescribes medication for you knows that you are taking Coumadin (warfarin).  Also make sure the healthcare provider who is monitoring your Coumadin knows when you have started a new medication including herbals and non-prescription products.  Coumadin (Warfarin)  Major Drug Interactions  Increased Warfarin Effect Decreased Warfarin Effect  Alcohol (large quantities) Antibiotics (esp. Septra/Bactrim, Flagyl, Cipro) Amiodarone (Cordarone) Aspirin (ASA) Cimetidine (Tagamet) Megestrol (Megace) NSAIDs (ibuprofen, naproxen, etc.) Piroxicam (  Feldene) °Propafenone (Rythmol SR) °Propranolol (Inderal) °Isoniazid (INH) °Posaconazole (Noxafil) Barbiturates (Phenobarbital) °Carbamazepine (Tegretol) °Chlordiazepoxide (Librium) °Cholestyramine (Questran) °Griseofulvin °Oral Contraceptives °Rifampin °Sucralfate (Carafate) °Vitamin K  ° °Coumadin® (Warfarin) Major Herbal Interactions  °Increased Warfarin Effect Decreased Warfarin Effect  °Garlic °Ginseng °Ginkgo biloba Coenzyme  Q10 °Green tea °St. John’s wort   ° °Coumadin® (Warfarin) FOOD Interactions  °Eat a consistent number of servings per week of foods HIGH in Vitamin K °(1 serving = ½ cup)  °Collards (cooked, or boiled & drained) °Kale (cooked, or boiled & drained) °Mustard greens (cooked, or boiled & drained) °Parsley *serving size only = ¼ cup °Spinach (cooked, or boiled & drained) °Swiss chard (cooked, or boiled & drained) °Turnip greens (cooked, or boiled & drained)  °Eat a consistent number of servings per week of foods MEDIUM-HIGH in Vitamin K °(1 serving = 1 cup)  °Asparagus (cooked, or boiled & drained) °Broccoli (cooked, boiled & drained, or raw & chopped) °Brussel sprouts (cooked, or boiled & drained) *serving size only = ½ cup °Lettuce, raw (green leaf, endive, romaine) °Spinach, raw °Turnip greens, raw & chopped  ° °These websites have more information on Coumadin (warfarin):  www.coumadin.com; °www.ahrq.gov/consumer/coumadin.htm; ° ° ° °

## 2018-10-06 NOTE — Clinical Social Work Note (Signed)
Clinical Social Work Assessment  Patient Details  Name: [name] [name] MRN: [id number] Date of Birth: 1941/04/06  Date of referral:  10/06/18               Reason for consult:  Facility Placement                Permission sought to share information with:  Family Supports Permission granted to share information::     Name::     [name] [name]  Agency::  [facility]   Relationship::  Spouse  Contact Information:  [phone]  Housing/Transportation Living arrangements for the past 2 months:  Single Family Home Source of Information:  Patient, Spouse, Adult Children Patient Interpreter Needed:  None Criminal Activity/Legal Involvement Pertinent to Current Situation/Hospitalization:  No - Comment as needed Significant Relationships:  Adult Children, Spouse Lives with:  Spouse Do you feel safe going back to the place where you live?  No Need for family participation in patient care:  No (Coment)  Care giving concerns:  Pt is alert and oriented. Pt's spouse and daughter present at d/c.    Social Worker assessment / plan:  Pt lives at home with spouse. Pt's spouse only wants pt to go to [facility]. However, if [facility] can not take then pt's spouse would consider [facility]. CSW will follow up with [facility].  Employment status:  Retired Forensic scientist:  Medicare PT Recommendations:  [place] / Referral to community resources:  [place]  Patient/Family's Response to care:  Pt verbalized understanding of CSW role and expressed appreciation for support. Pt denies any concern regarding pt care at this time.   Patient/Family's Understanding of and Emotional Response to Diagnosis, Current Treatment, and Prognosis:  Pt understanding and realistic regarding physical limitations. Pt understands the need for SNF placement at d/c. Pt agreeable to SNF placement at d/c, at this time. Pt's responses emotionally appropriate during conversation with CSW. Pt denies any concern  regarding treatment plan at this time. CSW will continue to provide support and facilitate d/c needs.   Emotional Assessment Appearance:  Appears stated age Attitude/Demeanor/Rapport:  (Patient was appropriate) Affect (typically observed):  Accepting, Appropriate, Calm Orientation:  Oriented to  Time, Oriented to Place, Oriented to Self, Oriented to Situation Alcohol / Substance use:  Not Applicable Psych involvement (Current and /or in the community):  No (Comment)  Discharge Needs  Concerns to be addressed:  Care Coordination, Basic Needs Readmission within the last 30 days:  No Current discharge risk:  Dependent with Mobility Barriers to Discharge:  Continued Medical Work up   [employer], LCSW 10/06/2018, 3:38 PM

## 2018-10-06 NOTE — Progress Notes (Addendum)
Initial Nutrition Assessment  DOCUMENTATION CODES:   Not applicable  INTERVENTION:    Ensure Enlive po BID, each supplement provides 350 kcal and 20 grams of protein  NUTRITION DIAGNOSIS:   Increased nutrient needs related to acute illness, chronic illness as evidenced by estimated needs  GOAL:   Patient will meet greater than or equal to 90% of their needs  MONITOR:   PO intake, Supplement acceptance, Labs, Skin, Weight trends  REASON FOR ASSESSMENT:   Malnutrition Screening Tool  ASSESSMENT:   78 y.o. Male with PMH of COPD, oxygen dependent on 2 L nasal cannula oxygen, chronic steroid use, PAF, and history of GI bleed; who presented with complaints of shortness of breath.  Patient initially met SIRS criteria but was found to have acute pulmonary embolus.  RD unable to obtain nutrition history. Pt having midline catheter placed at time of visit today.  Per nutrition screen, pt has been eating poorly bc of a decreased appetite. Pt currently on a Heart Healthy diet; PO intake ~ 50% this AM. Pt also reported having recent weight loss without trying.  Per encounters below, pt has had a 22% weight loss since 07/2018. Weight loss is severe for time fame; ? accuracy of readings. Labs & medications reviewed. BUN 30 (H).  Spoke with Colletta [place], RN 1300. Pt currently not ready to eat lunch; was having chest pain; Morphine given. He would like to wait for his wife to help him eat when she arrives. Ensure Enlive supplement ordered 1/21; he is drinking.  NUTRITION - FOCUSED PHYSICAL EXAM:  Unable to assess at this time, however, suspect malnutrition.  Diet Order:   Diet Order            Diet Heart Room service appropriate? Yes; Fluid consistency: Thin  Diet effective now             EDUCATION NEEDS:   Not appropriate for education at this time  Skin:  Skin Assessment: Reviewed RN Assessment  Last BM:  1/20  Height:   Ht Readings from Last 1 Encounters:   10/04/18 _0  (1.778 m)   Weight:   Wt Readings from Last 1 Encounters:  10/04/18 59 kg   Wt Readings from Last 10 Encounters:  10/04/18 59 kg  08/24/18 70.4 kg  08/11/18 75.8 kg  07/22/18 75.8 kg  06/24/18 83.5 kg  06/03/18 75.8 kg  05/05/18 73.8 kg  04/22/18 72.6 kg  04/20/18 73 kg  03/31/18 71.2 kg   BMI:  Body mass index is 18.65 kg/m.  Estimated Nutritional Needs:   Kcal:  1600-1800  Protein:  80-95 gm  Fluid:  1.6-1.8 L  Arthur Holms, RD, LDN Pager #: [phone] After-Hours Pager #: [phone]

## 2018-10-06 NOTE — NC FL2 (Signed)
[place] MEDICAID FL2 LEVEL OF CARE SCREENING TOOL     IDENTIFICATION  Patient Name: [name] [name] Birthdate: 03/06/1941 Sex: male Admission Date (Current Location): 10/04/2018  [facility] and [place] Number:  Herbalist and Address:  The . [facility], [place] [address], [place], [place] [address]      Provider Number: [id number]  Attending Physician Name and Address:  Samuella Cota, MD  Relative Name and Phone Number:       Current Level of Care: Hospital Recommended Level of Care: [place] Prior Approval Number:    Date Approved/Denied:   PASRR Number: [id number]  Discharge Plan: SNF    Current Diagnoses: Patient Active Problem List   Diagnosis Date Noted  . Pulmonary embolus ([place]) 10/05/2018  . Acute on chronic respiratory failure with hypoxia ([place]) 10/04/2018  . Thrombocytopenia ([place]) 10/04/2018  . Physical deconditioning 10/04/2018  . COPD with acute exacerbation ([place]) 08/11/2018  . Wears partial dentures 08/11/2018  . Sebaceous cyst   . Chronic venous insufficiency 03/31/2018  . SIRS (systemic inflammatory response syndrome) ([place]) 12/15/2017  . Chronic respiratory failure ([place]) 12/15/2017  . Elevated lactic acid level   . Hypotension   . Tachycardia 12/14/2017  . Malnutrition of moderate degree 12/09/2017  . Sepsis ([place]) 12/07/2017  . Protein-calorie malnutrition, severe 11/27/2017  . Orthostatic hypotension 11/25/2017  . Leukocytosis 11/25/2017  . Abdominal aortic aneurysm (AAA) without rupture ([place]) 10/27/2017  . Multiple lung nodules on CT 05/14/2016  . COPD, severe ([place]) 02/19/2016  . Emphysema of lung ([place]) 02/19/2016  . Personal history of prostate cancer   . Mixed hyperlipidemia   . DJD (degenerative joint disease), lumbar   . Neuropathy   . Back pain   . 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 ATRIAL FIBRILLATION, PAROXYSMAL 08/31/2007    Orientation RESPIRATION BLADDER Height & Weight      Self, Situation, Time, Place  Normal Indwelling catheter, Incontinent Weight: 130 lb (59 kg) Height:  5\' 10"  (177.8 cm)  BEHAVIORAL SYMPTOMS/MOOD NEUROLOGICAL BOWEL NUTRITION STATUS      Continent Diet(heart healthy, thin liquids)  AMBULATORY STATUS COMMUNICATION OF NEEDS Skin   Extensive Assist Verbally Normal                       Personal Care Assistance Level of Assistance  Feeding, Bathing, Dressing Bathing Assistance: Maximum assistance Feeding assistance: Independent Dressing Assistance: Maximum assistance     Functional Limitations Info  Sight, Hearing, Speech Sight Info: Adequate Hearing Info: Adequate Speech Info: Adequate    SPECIAL CARE FACTORS FREQUENCY  PT (By licensed PT), OT (By licensed OT)     PT Frequency: 2x OT Frequency: 2x            Contractures Contractures Info: Not present    Additional Factors Info  Code Status, Allergies Code Status Info: Partial Allergies Info: Albuterol           Current Medications (10/06/2018):  This is the current hospital active medication list Current Facility-Administered Medications  Medication Dose Route Frequency Provider Last Rate Last Dose  . acetaminophen (TYLENOL) tablet 650 mg  650 mg Oral Q6H PRN Shela Leff, MD   650 mg at 10/05/18 1023   Or  . acetaminophen (TYLENOL) suppository 650 mg  650 mg Rectal Q6H PRN Shela Leff, MD      . acetylcysteine (MUCOMYST) 10 % nebulizer solution 2 mL  2 mL Nebulization BID Shela Leff, MD   Stopped  at 10/06/18 0735  . cefTRIAXone (ROCEPHIN) 1 g in sodium chloride 0.9 % 100 mL IVPB  1 g Intravenous Q24H Norval Morton, MD   Stopped at 10/05/18 2033  . feeding supplement (ENSURE ENLIVE) (ENSURE ENLIVE) liquid 237 mL  237 mL Oral BID BM Shela Leff, MD   237 mL at 10/06/18 0948  . guaiFENesin (MUCINEX) 12 hr tablet 600 mg  600 mg Oral BID Shela Leff, MD   600 mg at 10/06/18 0950  . heparin ADULT infusion 100 units/mL (25000  units/264mL sodium chloride 0.45%)  1,000 Units/hr Intravenous Continuous Erenest Blank, RPH 10 mL/hr at 10/06/18 0956 1,000 Units/hr at 10/06/18 0956  . HYDROcodone-acetaminophen (NORCO/VICODIN) 5-325 MG per tablet 0.5-1 tablet  0.5-1 tablet Oral Q6H PRN Norval Morton, MD   1 tablet at 10/05/18 2257  . ipratropium (ATROVENT) nebulizer solution 0.5 mg  0.5 mg Nebulization TID Fuller Plan A, MD   0.5 mg at 10/06/18 0734  . levalbuterol (XOPENEX) nebulizer solution 0.63 mg  0.63 mg Nebulization TID Fuller Plan A, MD   0.63 mg at 10/06/18 0734  . pantoprazole (PROTONIX) EC tablet 40 mg  40 mg Oral Daily Fuller Plan A, MD   40 mg at 10/06/18 0950  . predniSONE (DELTASONE) tablet 40 mg  40 mg Oral Q breakfast Fuller Plan A, MD   40 mg at 10/06/18 2536  . pregabalin (LYRICA) capsule 150 mg  150 mg Oral Q8H Shela Leff, MD   150 mg at 10/06/18 6440  . warfarin (COUMADIN) tablet 5 mg  5 mg Oral ONCE-1800 Bertis Ruddy, RPH      . Warfarin - Pharmacist Dosing Inpatient   Does not apply q1800 Norval Morton, MD         Discharge Medications: Please see discharge summary for a list of discharge medications.  Relevant Imaging Results:  Relevant Lab Results:   Additional Information SSN: [id number]  Eileen Stanford, LCSW

## 2018-10-06 NOTE — Care Management Note (Signed)
Case Management Note  Patient Details  Name: [name] [name] MRN: [id number] Date of Birth: 09-Apr-1941  Subjective/Objective:  From home with spouse, acute/chronic resp failure, afib, bil PE, sepsis, copd, not on any anticoagulation, awaiting benefit check for eliquis/xarelto.  Patient is active with [facility] for [place], [place], [place], but per pt/ot eval on this admit rec SNF, CSW aware and working on placement.                             Action/Plan: DC to SNF when medically ready.  Expected Discharge Date:  10/05/18               Expected Discharge Plan:  Skilled Nursing Facility  In-House Referral:  Clinical Social Work  Discharge planning Services  CM Consult  Post Acute Care Choice:    Choice offered to:     DME Arranged:    DME Agency:     HH Arranged:    [place] Agency:     Status of Service:  Completed, signed off  If discussed at [employer], dates discussed:    Additional Comments:  [name], RN 10/06/2018, 3:14 PM

## 2018-10-07 LAB — CBC
HCT: 32.2 % — ABNORMAL LOW (ref 39.0–52.0)
Hemoglobin: 10.5 g/dL — ABNORMAL LOW (ref 13.0–17.0)
MCH: 30.6 pg (ref 26.0–34.0)
MCHC: 32.6 g/dL (ref 30.0–36.0)
MCV: 93.9 fL (ref 80.0–100.0)
Platelets: 142 10*3/uL — ABNORMAL LOW (ref 150–400)
RBC: 3.43 MIL/uL — ABNORMAL LOW (ref 4.22–5.81)
RDW: 17.2 % — ABNORMAL HIGH (ref 11.5–15.5)
WBC: 13.6 10*3/uL — ABNORMAL HIGH (ref 4.0–10.5)
nRBC: 0.1 % (ref 0.0–0.2)

## 2018-10-07 LAB — PROTIME-INR
INR: 2.81
INR: 3.68
Prothrombin Time: 29.2 seconds — ABNORMAL HIGH (ref 11.4–15.2)
Prothrombin Time: 36 seconds — ABNORMAL HIGH (ref 11.4–15.2)

## 2018-10-07 LAB — HEPARIN LEVEL (UNFRACTIONATED): Heparin Unfractionated: <0.1 IU/mL — ABNORMAL LOW (ref 0.30–0.70)

## 2018-10-07 MED ORDER — METHYLPREDNISOLONE SODIUM SUCC 40 MG IJ SOLR
40.0000 mg | Freq: Two times a day (BID) | INTRAMUSCULAR | Status: DC
  Administered 2018-10-07 – 2018-10-08 (×2): 40 mg via INTRAVENOUS
  Filled 2018-10-07 (×2): qty 1

## 2018-10-07 MED ORDER — HYDROCODONE-ACETAMINOPHEN 5-325 MG PO TABS: 0.5000 | ORAL_TABLET | Freq: Four times a day (QID) | ORAL | Status: DC | PRN

## 2018-10-07 MED ORDER — CEFDINIR 300 MG PO CAPS
300.0000 mg | ORAL_CAPSULE | Freq: Two times a day (BID) | ORAL | Status: AC
  Administered 2018-10-07 – 2018-10-08 (×4): 300 mg via ORAL
  Filled 2018-10-07 (×5): qty 1

## 2018-10-07 MED ORDER — POLYETHYLENE GLYCOL 3350 17 G PO PACK
17.0000 g | PACK | Freq: Two times a day (BID) | ORAL | Status: DC
  Administered 2018-10-07 – 2018-10-15 (×13): 17 g via ORAL
  Filled 2018-10-07 (×14): qty 1

## 2018-10-07 MED ORDER — MORPHINE SULFATE (PF) 2 MG/ML IV SOLN
2.0000 mg | INTRAVENOUS | Status: DC | PRN
  Administered 2018-10-07 – 2018-10-08 (×4): 2 mg via INTRAVENOUS
  Filled 2018-10-07 (×4): qty 1

## 2018-10-07 NOTE — Clinical Social Work Note (Signed)
[place] declined bed offer. [facility] still pending. Left voicemail for admissions coordinator asking her to review.  Dayton Scrape, [place]

## 2018-10-07 NOTE — Progress Notes (Signed)
PROGRESS NOTE  CREEK GAN UYQ:[id number] DOB: November 15, 1940 DOA: 10/04/2018 PCP: [name], MD  Brief History   78 year old man former smoker PMH COPD with frequent exacerbations, oxygen and prednisone dependent, presented with shortness of breath, of note recently hospitalized at [facility] for pneumonia and COPD exacerbation and discharged on doxycycline, liquid morphine for breathing and steroid who presented with shortness of breath and was admitted for acute on chronic hypoxic respiratory failure secondary to COPD exacerbation.  A & P  Acute bilateral pulmonary embolism with acute on chronic hypoxic respiratory failure.  Baseline requirement 2-3 L.  No evidence of RV strain by CT or echocardiogram.  Most likely precipitated by near bedbound status. --Much improved clinically today.  Continue warfarin although given rapid rise in INR hold aspirin.  Monitor for bleeding.  No vitamin K unless has bleeding.  COPD exacerbation, chronic steroid dependency, chronic hypoxic respiratory failure --Much improved today.  Wean steroids; continue bronchodilators, supplemental oxygen.  Thrombocytopenia --Present on admission, appears to be stabilizing, likely secondary to acute PE.  Sepsis considered on admission with elevated lactic acid and recent treatment for pneumonia however CT showed improving interstitial densities in the right lung  --Cannot completely rule out residual pneumonia from recent admission, will continue empiric ceftriaxone.  Sepsis ruled out.  Paroxysmal atrial fibrillation.  Eliquis previously discontinued secondary to epistaxis. --Continue diltiazem, warfarin.  History of anginal chest pain as recorded by Dr. Domenic Polite 07/2018.  Reassuring Myoview in August. --Stable.  Continue aspirin, nitroglycerin as needed.  Generalized debility --PT/OT   Overall appears somewhat better today but still tachypneic, frail.  INR significantly increased, discussed with pharmacist,  will hold heparin today given rapid rising of INR and history of bleeding on anticoagulation.  Will check CBC and INR in the morning.  Will administer vitamin K only if patient has significant bleeding.  DVT prophylaxis: heparin, warfarin Code Status: partial Family Communication: none Disposition Plan: SNF per PT, OT    [name], MD  [facility] Direct contact: see [email or web address]  7PM-7AM contact night coverage as above 10/07/2018, 5:30 PM  LOS: 2 days   Consultants  .   Procedures  . Echo Study Conclusions  - Procedure narrative: Transthoracic echocardiography. Image   quality was adequate. The study was technically difficult. - Left ventricle: The cavity size was normal. Wall thickness was   increased in a pattern of mild LVH. Systolic function was   vigorous. The estimated ejection fraction was in the range of 65%   to 70%. Wall motion was normal; there were no regional wall   motion abnormalities. Doppler parameters are consistent with   abnormal left ventricular relaxation (grade 1 diastolic   dysfunction). The E/e&' ratio is between 8-15, suggesting elevated   LV filling pressure. - Mitral valve: Mildly thickened leaflets . There was trivial   regurgitation. - Left atrium: The atrium was normal in size. - Tricuspid valve: There was trivial regurgitation. - Pulmonary arteries: PA peak pressure: 36 mm Hg (S). - Inferior vena cava: The vessel was normal in size. The   respirophasic diameter changes were in the normal range (>= 50%),   consistent with normal central venous pressure.  Impressions:  - Technically difficult study. Hyperdynamic LV. Probably no   signifcant change compared to prior study in 11/2017.  Antibiotics  .   Interval History/Subjective  Slept some last night, feeling better today, breathing better today.  Still has some chest pain.  Objective   Vitals:  Vitals:  10/07/18 1443 10/07/18 1700  BP:  110/85  Pulse:  85  Resp:     Temp:  97.8 F (36.6 C)  SpO2: 94% 95%    Exam:  Constitutional:   . Appears calm, more comfortable today, nontoxic Eyes:  . pupils and irises appear normal . Normal lids ENMT:  . grossly normal hearing  Respiratory:  . CTA bilaterally, no w/r/r.  . Respiratory effort mild to moderate increased Cardiovascular:  . RRR, no m/r/g . No LE extremity edema   Abdomen:  . Soft Psychiatric:  . Mental status o Mood, affect appropriate o Judgment and insight appear intact   I have personally reviewed the following:   Today's Data  . Procalcitonin less than 0.1.  INR has increased up to 3.68.  Hemoglobin stable at 10.5.  Platelets stable at 142.  Lab Data  . noted  Micro Data  . noted  Imaging  . CXR and CTA chest noted  Cardiology Data  . Sinus tachycardia, nonspecific ST changes  Other Data  .   Scheduled Meds: . cefdinir  300 mg Oral Q12H  . diltiazem  120 mg Oral QPM  . diltiazem  240 mg Oral q morning - 10a  . feeding supplement (ENSURE ENLIVE)  237 mL Oral BID BM  . guaiFENesin  600 mg Oral BID  . ipratropium  0.5 mg Nebulization TID  . levalbuterol  0.63 mg Nebulization TID  . methylPREDNISolone (SOLU-MEDROL) injection  40 mg Intravenous Q12H  . mometasone-formoterol  2 puff Inhalation BID  . pantoprazole  40 mg Oral Daily  . polyethylene glycol  17 g Oral BID  . pregabalin  150 mg Oral Q8H  . sodium chloride flush  10-40 mL Intracatheter Q12H  . Warfarin - Pharmacist Dosing Inpatient   Does not apply q1800   Continuous Infusions:   Principal Problem:   Acute on chronic respiratory failure with hypoxia (HCC) Active Problems:   ATRIAL FIBRILLATION, PAROXYSMAL   COPD with acute exacerbation (HCC)   Thrombocytopenia (HCC)   Physical deconditioning   Pulmonary embolus (HCC)   LOS: 2 days

## 2018-10-07 NOTE — Progress Notes (Signed)
ANTICOAGULATION CONSULT NOTE - Follow Up Consult  Pharmacy Consult for warfarin Indication: atrial fibrillation and pulmonary embolus  Allergies  Allergen Reactions  . Albuterol Other (See Comments)    Continuous nebulization caused extreme tachycardia. May use the inhaler.    Patient Measurements: Height: 5\' 10"  (177.8 cm) Weight: 130 lb (59 kg) IBW/kg (Calculated) : 73  Vital Signs: Temp: 98 F (36.7 C) (01/23 0727) Temp Source: Oral (01/23 0727) BP: 106/87 (01/23 0727) Pulse Rate: 100 (01/23 0727)  Labs: Recent Labs    10/04/18 1756  10/05/18 0230 10/05/18 1141 10/06/18 0526 10/07/18 0629 10/07/18 0630 10/07/18 0830  HGB 13.3  --  11.1*  --  11.1* 10.5*  --   --   HCT 41.2  --  34.1*  --  34.4* 32.2*  --   --   PLT 146*  --  PLATELET CLUMPS NOTED ON SMEAR, UNABLE TO ESTIMATE  --  137* 142*  --   --   APTT  --   --  26  --   --   --   --   --   LABPROT  --    < > 15.0  --  15.6* 29.2*  --  36.0*  INR  --    < > 1.19  --  1.25 2.81  --  3.68  HEPARINUNFRC  --   --   --  0.47 0.52  --  <0.10*  --   CREATININE 1.01  --   --   --  0.63  --   --   --    < > = values in this interval not displayed.    Estimated Creatinine Clearance: 64.5 mL/min (by C-G formula based on SCr of 0.63 mg/dL).   Medications:  Scheduled:  . cefdinir  300 mg Oral Q12H  . diltiazem  120 mg Oral QPM  . diltiazem  240 mg Oral q morning - 10a  . feeding supplement (ENSURE ENLIVE)  237 mL Oral BID BM  . guaiFENesin  600 mg Oral BID  . ipratropium  0.5 mg Nebulization TID  . levalbuterol  0.63 mg Nebulization TID  . methylPREDNISolone (SOLU-MEDROL) injection  40 mg Intravenous Q12H  . mometasone-formoterol  2 puff Inhalation BID  . pantoprazole  40 mg Oral Daily  . pregabalin  150 mg Oral Q8H  . sodium chloride flush  10-40 mL Intracatheter Q12H  . Warfarin - Pharmacist Dosing Inpatient   Does not apply q1800   Infusions:    Assessment: 78 year old male known to pharmacy for IV  Heparin dosing in setting of bilateral pulmonary embolism. Consulted pharmacy to start warfarin dosing per protocol. Note patient has been on Eliquis in the past but stopped therapy due to recurring nosebleeds. Decision was made to start warfarin therapy with plan for 3-6 months of anticoagulation for pulmonary embolism with monitoring of INR and for signs and symptoms of bleeding.   INR has become SUPRAtherapeutic 3.68 today from 1.25 s/p warfarin x 2 doses (4mg  then 5 mg).  CBC low but stable, plts 142.   Patient also received fluconazole x 1 dose on 1/21.   Goal of Therapy:  INR 2-3 Heparin level 0.3-0.7 units/ml Monitor platelets by anticoagulation protocol: Yes   Plan:  Discontinue heparin Hold warfarin Monitor daily INR, CBC, clinical course, s/sx of bleed, PO intake, DDI   Thank you for allowing [place] to participate in this patients care.   Jens Som, PharmD Please utilize Amion (under [facility]armacy) for appropriate number for your unit pharmacist. 10/07/2018 11:02 AM

## 2018-10-07 NOTE — Progress Notes (Addendum)
Pt experienced brief HR of 213 during a phone call from his grandson. Pt believed something to be "wrong" at home with family. He was scared. After the call, pt spoke w/ me. HR returned to previous rate. Pt is calm now, lying in bed, room dark, eyes closed. Pt states no needs or wants at this time.

## 2018-10-08 LAB — CBC
HCT: 29.3 % — ABNORMAL LOW (ref 39.0–52.0)
Hemoglobin: 9.4 g/dL — ABNORMAL LOW (ref 13.0–17.0)
MCH: 29.8 pg (ref 26.0–34.0)
MCHC: 32.1 g/dL (ref 30.0–36.0)
MCV: 93 fL (ref 80.0–100.0)
Platelets: 121 10*3/uL — ABNORMAL LOW (ref 150–400)
RBC: 3.15 MIL/uL — ABNORMAL LOW (ref 4.22–5.81)
RDW: 17 % — AB (ref 11.5–15.5)
WBC: 11.4 10*3/uL — ABNORMAL HIGH (ref 4.0–10.5)
nRBC: 1 % — ABNORMAL HIGH (ref 0.0–0.2)

## 2018-10-08 LAB — PROTIME-INR
INR: 5.04
Prothrombin Time: 45.9 seconds — ABNORMAL HIGH (ref 11.4–15.2)

## 2018-10-08 MED ORDER — MORPHINE SULFATE 10 MG/5ML PO SOLN: 5.0000 mg | ORAL | Status: DC | PRN

## 2018-10-08 MED ORDER — MORPHINE SULFATE (CONCENTRATE) 10 MG/0.5ML PO SOLN
5.0000 mg | ORAL | Status: DC | PRN
  Administered 2018-10-10: 5 mg via ORAL
  Filled 2018-10-08: qty 0.5

## 2018-10-08 MED ORDER — SENNA 8.6 MG PO TABS
1.0000 | ORAL_TABLET | Freq: Every day | ORAL | Status: DC
  Administered 2018-10-09 – 2018-10-14 (×5): 8.6 mg via ORAL
  Filled 2018-10-08 (×6): qty 1

## 2018-10-08 MED ORDER — HYDROCODONE-ACETAMINOPHEN 5-325 MG PO TABS
0.5000 | ORAL_TABLET | Freq: Four times a day (QID) | ORAL | Status: DC | PRN
  Administered 2018-10-08: 0.5 via ORAL
  Filled 2018-10-08: qty 1

## 2018-10-08 MED ORDER — BISACODYL 10 MG RE SUPP
10.0000 mg | Freq: Once | RECTAL | Status: AC
  Administered 2018-10-08: 10 mg via RECTAL
  Filled 2018-10-08 (×2): qty 1

## 2018-10-08 MED ORDER — PREDNISONE 20 MG PO TABS
40.0000 mg | ORAL_TABLET | Freq: Every day | ORAL | Status: DC
  Administered 2018-10-09: 40 mg via ORAL
  Filled 2018-10-08: qty 2

## 2018-10-08 NOTE — Progress Notes (Addendum)
CSW spoke with Mardene Celeste at [facility]. They are unable to take the patient at this time.   CSW will reach out to the family to see what other facilities they would like the patient to go to.   CSW spoke with the family. The family would like until Saturday morning to make a decision.   CSW will continue to monitor.   Domenic Schwab, MSW, [place]

## 2018-10-08 NOTE — Progress Notes (Signed)
CRITICAL VALUE ALERT  Critical Value:  PT 45.9 / INR 5.04  Date & Time Notied: 10/08/2018 @0456   Provider Notified: [place]  Orders Received/Actions taken: pending orders

## 2018-10-08 NOTE — Progress Notes (Signed)
ANTICOAGULATION CONSULT NOTE - Follow Up Consult  Pharmacy Consult for warfarin Indication: atrial fibrillation and pulmonary embolus  Allergies  Allergen Reactions  . Albuterol Other (See Comments)    Continuous nebulization caused extreme tachycardia. May use the inhaler.    Patient Measurements: Height: 5\' 10"  (177.8 cm) Weight: 130 lb (59 kg) IBW/kg (Calculated) : 73  Vital Signs: Temp: 97.9 F (36.6 C) (01/24 0731) Temp Source: Oral (01/24 0731) BP: 115/64 (01/24 0731) Pulse Rate: 83 (01/24 0731)  Labs: Recent Labs    10/05/18 1141  10/06/18 0526 10/07/18 0629 10/07/18 0630 10/07/18 0830 10/08/18 0245  HGB  --    < > 11.1* 10.5*  --   --  9.4*  HCT  --   --  34.4* 32.2*  --   --  29.3*  PLT  --   --  137* 142*  --   --  121*  LABPROT  --    < > 15.6* 29.2*  --  36.0* 45.9*  INR  --    < > 1.25 2.81  --  3.68 5.04*  HEPARINUNFRC 0.47  --  0.52  --  <0.10*  --   --   CREATININE  --   --  0.63  --   --   --   --    < > = values in this interval not displayed.    Estimated Creatinine Clearance: 64.5 mL/min (by C-G formula based on SCr of 0.63 mg/dL).   Medications:  Scheduled:  . cefdinir  300 mg Oral Q12H  . diltiazem  120 mg Oral QPM  . diltiazem  240 mg Oral q morning - 10a  . feeding supplement (ENSURE ENLIVE)  237 mL Oral BID BM  . guaiFENesin  600 mg Oral BID  . ipratropium  0.5 mg Nebulization TID  . levalbuterol  0.63 mg Nebulization TID  . methylPREDNISolone (SOLU-MEDROL) injection  40 mg Intravenous Q12H  . mometasone-formoterol  2 puff Inhalation BID  . pantoprazole  40 mg Oral Daily  . polyethylene glycol  17 g Oral BID  . pregabalin  150 mg Oral Q8H  . sodium chloride flush  10-40 mL Intracatheter Q12H  . Warfarin - Pharmacist Dosing Inpatient   Does not apply q1800   Infusions:    Assessment: 78 year old male known to pharmacy for IV Heparin dosing in setting of bilateral pulmonary embolism. Consulted pharmacy to start warfarin dosing  per protocol. Note patient has been on Eliquis in the past but stopped therapy due to recurring nosebleeds. Decision was made to start warfarin therapy with plan for 3-6 months of anticoagulation for pulmonary embolism with monitoring of INR and for signs and symptoms of bleeding.   INR has become SUPRAtherapeutic 5.04 today. CBC trending down, plts 121.   Patient also received fluconazole x 1 dose on 1/21. Also on both cefdinir and methylpred that can increase the effects of warfarin therapy  Goal of Therapy:  INR 2-3 Heparin level 0.3-0.7 units/ml Monitor platelets by anticoagulation protocol: Yes   Plan:  Hold warfarin Monitor daily INR, CBC, clinical course, s/sx of bleed, PO intake, DDI   Thank you for allowing [place] to participate in this patients care.   Alanda Slim, PharmD, [facility] Clinical Pharmacist Please see AMION for all Pharmacists' Contact Phone Numbers 10/08/2018, 8:10 AM

## 2018-10-08 NOTE — Care Management Important Message (Signed)
Important Message  Patient Details  Name: [name] [name] MRN: [id number] Date of Birth: April 12, 1941   Medicare Important Message Given:  Yes    Barb Merino Kenlyn Lose 10/08/2018, 10:57 AM

## 2018-10-08 NOTE — Progress Notes (Signed)
CSW called [facility] and spoke with Mardene Celeste. [facility] will plan on looking at the patient. Will need to start insurance authorization if they are able to offer a bed.   Unsure of when patient will be ready for discharge. Not currently medically stable.   CSW will continue to follow.   Domenic Schwab, MSW, [place]

## 2018-10-08 NOTE — Progress Notes (Signed)
PROGRESS NOTE  [name] [name] MIW:[id number] DOB: 1940-10-31 DOA: 10/04/2018 PCP: [name], MD  Brief History   78 year old man former smoker PMH COPD with frequent exacerbations, oxygen and prednisone dependent, presented with shortness of breath, of note recently hospitalized at [facility] for pneumonia and COPD exacerbation and discharged on doxycycline, liquid morphine for breathing and steroid who presented with shortness of breath and was admitted for acute on chronic hypoxic respiratory failure secondary to COPD exacerbation.  A & P  Acute bilateral pulmonary embolism with acute on chronic hypoxic respiratory failure.  Baseline requirement 2-3 L.  No evidence of RV strain by CT or echocardiogram.  Most likely precipitated by near bedbound status. --Appears to be improving, respiratory status somewhat tenuous but improved over 48 hours ago. --INR now 5.04, hopefully peaked.  Will recheck in a.m.  No evidence of bleeding.  Heparin discontinued secondary to rapid rise of INR.  COPD exacerbation, chronic steroid dependency, chronic hypoxic respiratory failure --Seems to be stabilizing.  Continue steroids and bronchodilators.  Continue supplemental oxygen.  Thrombocytopenia --Somewhat labile, suspect related to acute illness.  Will repeat CBC in a.m.  Sepsis considered on admission with elevated lactic acid and recent treatment for pneumonia however CT showed improving interstitial densities in the right lung  --Sepsis ruled out but could not rule out residual pneumonia.  Will complete a short course of antibiotics.  Paroxysmal atrial fibrillation.  Eliquis previously discontinued secondary to epistaxis. --Stable.  Continue diltiazem.  Resume warfarin when INR drifting down.  History of anginal chest pain as recorded by Dr. Domenic Polite 07/2018.  Reassuring Myoview in August. --Remained stable.  Hold aspirin until INR has decreased, continue nitroglycerin as needed.  Generalized  debility --PT/OT   Improving.  Monitor INR until stable.  Monitor platelets until proven stable.  Respiratory status improving.  DVT prophylaxis: Warfarin but currently on hold see above Code Status: partial Family Communication: none Disposition Plan: SNF per PT, OT    [name], MD  [facility] Direct contact: see [email or web address]  7PM-7AM contact night coverage as above 10/08/2018, 11:30 AM  LOS: 3 days   Consultants  .   Procedures  . Echo Study Conclusions  - Procedure narrative: Transthoracic echocardiography. Image   quality was adequate. The study was technically difficult. - Left ventricle: The cavity size was normal. Wall thickness was   increased in a pattern of mild LVH. Systolic function was   vigorous. The estimated ejection fraction was in the range of 65%   to 70%. Wall motion was normal; there were no regional wall   motion abnormalities. Doppler parameters are consistent with   abnormal left ventricular relaxation (grade 1 diastolic   dysfunction). The E/e&' ratio is between 8-15, suggesting elevated   LV filling pressure. - Mitral valve: Mildly thickened leaflets . There was trivial   regurgitation. - Left atrium: The atrium was normal in size. - Tricuspid valve: There was trivial regurgitation. - Pulmonary arteries: PA peak pressure: 36 mm Hg (S). - Inferior vena cava: The vessel was normal in size. The   respirophasic diameter changes were in the normal range (>= 50%),   consistent with normal central venous pressure.  Impressions:  - Technically difficult study. Hyperdynamic LV. Probably no   signifcant change compared to prior study in 11/2017.  Antibiotics  .   Interval History/Subjective  Overall feeling a little bit better, breathing a little bit better.  Still has some chest pain.  This is constant in nature.  Objective   Vitals:  Vitals:   10/08/18 0716 10/08/18 0731  BP:  115/64  Pulse:  83  Resp:  20  Temp:  97.9  F (36.6 C)  SpO2: 98% 96%    Exam:  Constitutional:   . Appears calm and comfortable, weak Eyes:  . pupils and irises appear normal ENMT:  . grossly normal hearing  . Lips appear normal . Oropharynx: mucosa, tongue appear normal Respiratory:  . CTA bilaterally, no w/r/r.  . Respiratory effort normal. Cardiovascular:  . RRR, no m/r/g . No LE extremity edema   Psychiatric:  . Mental status o Mood, affect appropriate  I have personally reviewed the following:   Today's Data  . Platelets 121, hemoglobin 9.4, WBC 11.4, INR 5.04  Lab Data  . noted  Micro Data  . noted  Imaging  . CXR and CTA chest noted  Cardiology Data  . Sinus tachycardia, nonspecific ST changes  Other Data  .   Scheduled Meds: . cefdinir  300 mg Oral Q12H  . diltiazem  120 mg Oral QPM  . diltiazem  240 mg Oral q morning - 10a  . feeding supplement (ENSURE ENLIVE)  237 mL Oral BID BM  . guaiFENesin  600 mg Oral BID  . ipratropium  0.5 mg Nebulization TID  . levalbuterol  0.63 mg Nebulization TID  . methylPREDNISolone (SOLU-MEDROL) injection  40 mg Intravenous Q12H  . mometasone-formoterol  2 puff Inhalation BID  . pantoprazole  40 mg Oral Daily  . polyethylene glycol  17 g Oral BID  . pregabalin  150 mg Oral Q8H  . sodium chloride flush  10-40 mL Intracatheter Q12H  . Warfarin - Pharmacist Dosing Inpatient   Does not apply q1800   Continuous Infusions:   Principal Problem:   Acute on chronic respiratory failure with hypoxia (HCC) Active Problems:   ATRIAL FIBRILLATION, PAROXYSMAL   COPD with acute exacerbation (HCC)   Thrombocytopenia (HCC)   Physical deconditioning   Pulmonary embolus (HCC)   LOS: 3 days

## 2018-10-08 NOTE — Progress Notes (Signed)
Physical Therapy Treatment Patient Details Name: [name] [name] MRN: [id number] DOB: 1941/09/15 Today's Date: 10/08/2018    History of Present Illness [name] [name] is a 78 y.o. male with medical history significant of former smoker (80 pack-year history), COPD with frequent exacerbations (oxygen and prednisone dependent), hyperlipidemia, paroxysmal atrial fibrillation presenting to the hospital for evaluation of shortness of breath.  Patient reports having chronic shortness of breath due to his COPD and uses 2 to 3 L home oxygen at all times.  States his dyspnea has been worse for the past 2 days leading up to admission, and he has been having generalized weakness. CT anfio showing bil PEs; RN cleared for activity    PT Comments    Patient declining OOB mobility today, just done with breathing treatment states he is having a hard time breathing. VSS.  Focused on bed level therex and reinforcing dosage. Pt agreeable. Cont to rec SNF.     Follow Up Recommendations  SNF     Equipment Recommendations  Wheelchair (measurements PT)    Recommendations for Other Services       Precautions / Restrictions Precautions Precautions: Fall Precaution Comments: significantly decr functional capacity Restrictions Weight Bearing Restrictions: No    Mobility  Bed Mobility Overal bed mobility: Needs Assistance Bed Mobility: Rolling;Sidelying to Sit              Transfers                    Ambulation/Gait                 Stairs             Wheelchair Mobility    Modified Rankin (Stroke Patients Only)       Balance                                            Cognition Arousal/Alertness: Awake/alert Behavior During Therapy: WFL for tasks assessed/performed Overall Cognitive Status: Within Functional Limits for tasks assessed                                        Exercises General Exercises - Lower Extremity Ankle  Circles/Pumps: 10 reps Quad Sets: 10 reps Gluteal Sets: 10 reps Short Arc Quad: 10 reps Heel Slides: 10 reps Hip ABduction/ADduction: 10 reps Straight Leg Raises: 10 reps    General Comments        Pertinent Vitals/Pain Pain Assessment: Faces Faces Pain Scale: Hurts a little bit Pain Location: bilateral upper chest pain Pain Descriptors / Indicators: Aching Pain Intervention(s): Monitored during session;Limited activity within patient's tolerance    Home Living                      Prior Function            PT Goals (current goals can now be found in the care plan section) Acute Rehab PT Goals Patient Stated Goal: To get back on his feet and do things for himself PT Goal Formulation: With patient Time For Goal Achievement: 10/19/18 Potential to Achieve Goals: Fair Progress towards PT goals: Progressing toward goals    Frequency    Min 2X/week      PT Plan Current plan remains appropriate  Co-evaluation              AM-PAC PT "6 Clicks" Mobility   Outcome Measure  Help needed turning from your back to your side while in a flat bed without using bedrails?: A Little Help needed moving from lying on your back to sitting on the side of a flat bed without using bedrails?: A Lot Help needed moving to and from a bed to a chair (including a wheelchair)?: A Lot Help needed standing up from a chair using your arms (e.g., wheelchair or bedside chair)?: A Lot Help needed to walk in hospital room?: A Lot Help needed climbing 3-5 steps with a railing? : Total 6 Click Score: 12    End of Session Equipment Utilized During Treatment: Gait belt Activity Tolerance: Patient limited by fatigue Patient left: in chair;with call bell/phone within reach;with family/visitor present Nurse Communication: Mobility status PT Visit Diagnosis: Unsteadiness on feet (R26.81);Muscle weakness (generalized) (M62.81);Other (comment)     Time: 1093-2355 PT Time Calculation  (min) (ACUTE ONLY): 23 min  Charges:  $Therapeutic Exercise: 8-22 mins                    Reinaldo Berber, PT, DPT Acute Rehabilitation Services Pager: [phone] Office: [phone]   Reinaldo Berber 10/08/2018, 3:32 PM

## 2018-10-09 LAB — CULTURE, BLOOD (ROUTINE X 2)
CULTURE: NO GROWTH
Culture: NO GROWTH
Special Requests: ADEQUATE
Special Requests: ADEQUATE

## 2018-10-09 LAB — PROTIME-INR
INR: 3.54
Prothrombin Time: 34.9 seconds — ABNORMAL HIGH (ref 11.4–15.2)

## 2018-10-09 LAB — CBC
HCT: 30 % — ABNORMAL LOW (ref 39.0–52.0)
HEMOGLOBIN: 9.6 g/dL — AB (ref 13.0–17.0)
MCH: 30.5 pg (ref 26.0–34.0)
MCHC: 32 g/dL (ref 30.0–36.0)
MCV: 95.2 fL (ref 80.0–100.0)
Platelets: 130 10*3/uL — ABNORMAL LOW (ref 150–400)
RBC: 3.15 MIL/uL — ABNORMAL LOW (ref 4.22–5.81)
RDW: 17.5 % — ABNORMAL HIGH (ref 11.5–15.5)
WBC: 12.5 10*3/uL — ABNORMAL HIGH (ref 4.0–10.5)
nRBC: 1 % — ABNORMAL HIGH (ref 0.0–0.2)

## 2018-10-09 MED ORDER — PREDNISONE 20 MG PO TABS
20.0000 mg | ORAL_TABLET | Freq: Every day | ORAL | Status: DC
  Administered 2018-10-10: 20 mg via ORAL
  Filled 2018-10-09: qty 1

## 2018-10-09 MED ORDER — HYDROCODONE-ACETAMINOPHEN 5-325 MG PO TABS
1.0000 | ORAL_TABLET | Freq: Four times a day (QID) | ORAL | Status: DC | PRN
  Administered 2018-10-09 (×2): 1 via ORAL
  Filled 2018-10-09 (×2): qty 1

## 2018-10-09 NOTE — Progress Notes (Signed)
CSW and RNCM Jackelyn Poling met with the patient and family at bedside. Patient was considering the options of going to a skilled nursing facility placement verses going home with continued home health. Patient already is established with [facility].   Patient decided that the would like to proceed with returning home with home health.   CSW signing off at this time.   [name] [name], MSW, [place]

## 2018-10-09 NOTE — Progress Notes (Signed)
ANTICOAGULATION CONSULT NOTE - Follow Up Consult  Pharmacy Consult for warfarin Indication: atrial fibrillation and pulmonary embolus  Allergies  Allergen Reactions  . Albuterol Other (See Comments)    Continuous nebulization caused extreme tachycardia. May use the inhaler.    Patient Measurements: Height: 5\' 10"  (177.8 cm) Weight: 130 lb (59 kg) IBW/kg (Calculated) : 73  Vital Signs: Temp: 98.1 F (36.7 C) (01/25 0813) Temp Source: Oral (01/25 0813) BP: 127/64 (01/25 0813) Pulse Rate: 91 (01/25 0813)  Labs: Recent Labs    10/07/18 0629 10/07/18 0630 10/07/18 0830 10/08/18 0245 10/09/18 0526  HGB 10.5*  --   --  9.4* 9.6*  HCT 32.2*  --   --  29.3* 30.0*  PLT 142*  --   --  121* 130*  LABPROT 29.2*  --  36.0* 45.9* 34.9*  INR 2.81  --  3.68 5.04* 3.54  HEPARINUNFRC  --  <0.10*  --   --   --     Estimated Creatinine Clearance: 64.5 mL/min (by C-G formula based on SCr of 0.63 mg/dL).   Medications:  Scheduled:  . diltiazem  120 mg Oral QPM  . diltiazem  240 mg Oral q morning - 10a  . feeding supplement (ENSURE ENLIVE)  237 mL Oral BID BM  . guaiFENesin  600 mg Oral BID  . ipratropium  0.5 mg Nebulization TID  . levalbuterol  0.63 mg Nebulization TID  . mometasone-formoterol  2 puff Inhalation BID  . pantoprazole  40 mg Oral Daily  . polyethylene glycol  17 g Oral BID  . predniSONE  40 mg Oral Q breakfast  . pregabalin  150 mg Oral Q8H  . senna  1 tablet Oral QHS  . sodium chloride flush  10-40 mL Intracatheter Q12H  . Warfarin - Pharmacist Dosing Inpatient   Does not apply q1800   Infusions:    Assessment: 78 year old male known to pharmacy for IV Heparin dosing in setting of bilateral pulmonary embolism. Consulted pharmacy to start warfarin dosing per protocol. Note patient has been on Eliquis in the past but stopped therapy due to recurring nosebleeds. Decision was made to start warfarin therapy with plan for 3-6 months of anticoagulation for pulmonary  embolism with monitoring of INR and for signs and symptoms of bleeding.   INR has become SUPRAtherapeutic,  Decreasing to 3.54 today. CBC stable, plts 130.   Patient also received fluconazole x 1 dose on 1/21. Also, was on both cefdinir and methylpred that can increase the effects of warfarin therapy.   Goal of Therapy:  INR 2-3 Heparin level 0.3-0.7 units/ml Monitor platelets by anticoagulation protocol: Yes   Plan:  Hold warfarin again tonight Monitor daily INR, CBC, clinical course, s/sx of bleed, PO intake, DDI   Jissel Slavens A. Levada Dy, PharmD, [place] Pager: [phone] Please utilize Am[facility] for appropriate phone number to reach the unit pharmacist ([place])   10/09/2018, 10:23 AM

## 2018-10-09 NOTE — Progress Notes (Signed)
PROGRESS NOTE  [name] [name] IDP:[id number] DOB: Feb 07, 1941 DOA: 10/04/2018 PCP: [name], MD  Brief History   78 year old man former smoker PMH COPD with frequent exacerbations, oxygen and prednisone dependent, presented with shortness of breath, of note recently hospitalized at [facility] for pneumonia and COPD exacerbation and discharged on doxycycline, liquid morphine for breathing and steroid who presented with shortness of breath and was admitted for acute on chronic hypoxic respiratory failure secondary to COPD exacerbation.  A & P  Acute bilateral pulmonary embolism with acute on chronic hypoxic respiratory failure.  Baseline requirement 2-3 L.  No evidence of RV strain by CT or echocardiogram.  Most likely precipitated by near bedbound status. --Slowly improving.  Respiratory status probably close to baseline although does appear to be tenuous. --INR trending down appropriately.  Continue pharmacy management of warfarin, holding dose today.  COPD exacerbation, chronic steroid dependency, chronic hypoxic respiratory failure --Stabilizing.  Wean steroids.  Continue bronchodilators.  Completed antibiotics.  Thrombocytopenia --Stable, secondary to acute illness, follow-up as an outpatient.  Sepsis considered on admission with elevated lactic acid and recent treatment for pneumonia however CT showed improving interstitial densities in the right lung  --Sepsis ruled out but could not rule out residual pneumonia.  Completed short course of antibiotics.  Paroxysmal atrial fibrillation.  Eliquis previously discontinued secondary to epistaxis. --Remains stable.  Continue diltiazem.  History of anginal chest pain as recorded by Dr. Domenic Polite 07/2018.  Reassuring Myoview in August. --Stable.  Hold aspirin until INR has decreased, continue nitroglycerin as needed.  Generalized debility --PT/OT   Slowly improving.  Anticipate transfer to skilled nursing facility when bed obtained,  likely 1/27.  DVT prophylaxis: Warfarin but currently on hold see above Code Status: partial Family Communication: none Disposition Plan: SNF per PT, OT    [name], MD  [facility] Direct contact: see [email or web address]  7PM-7AM contact night coverage as above 10/09/2018, 1:09 PM  LOS: 4 days   Consultants  .   Procedures  . Echo Study Conclusions  - Procedure narrative: Transthoracic echocardiography. Image   quality was adequate. The study was technically difficult. - Left ventricle: The cavity size was normal. Wall thickness was   increased in a pattern of mild LVH. Systolic function was   vigorous. The estimated ejection fraction was in the range of 65%   to 70%. Wall motion was normal; there were no regional wall   motion abnormalities. Doppler parameters are consistent with   abnormal left ventricular relaxation (grade 1 diastolic   dysfunction). The E/e&' ratio is between 8-15, suggesting elevated   LV filling pressure. - Mitral valve: Mildly thickened leaflets . There was trivial   regurgitation. - Left atrium: The atrium was normal in size. - Tricuspid valve: There was trivial regurgitation. - Pulmonary arteries: PA peak pressure: 36 mm Hg (S). - Inferior vena cava: The vessel was normal in size. The   respirophasic diameter changes were in the normal range (>= 50%),   consistent with normal central venous pressure.  Impressions:  - Technically difficult study. Hyperdynamic LV. Probably no   signifcant change compared to prior study in 11/2017.  Antibiotics  .   Interval History/Subjective  Slowly improving although still short of breath and has some ongoing chest pain.  Objective   Vitals:  Vitals:   10/09/18 0812 10/09/18 0813  BP:  127/64  Pulse:  91  Resp:  12  Temp:  98.1 F (36.7 C)  SpO2: 93% 95%  Exam:  Constitutional:   . Appears calm and comfortable Respiratory:  . CTA bilaterally, no w/r/r.  . Respiratory effort  moderately increased but appears stable.  Able to speak in short sentences. Cardiovascular:  . RRR, no m/r/g . No LE extremity edema   Psychiatric:  . Mental status o Mood, affect appropriate  I have personally reviewed the following:   Today's Data  . Hemoglobin stable at 9.6.  Platelets trending back up, 130.  INR down to 3.54.  Lab Data  . noted  Micro Data  . noted  Imaging  . CXR and CTA chest noted  Cardiology Data  . Sinus tachycardia, nonspecific ST changes  Other Data  .   Scheduled Meds: . diltiazem  120 mg Oral QPM  . diltiazem  240 mg Oral q morning - 10a  . feeding supplement (ENSURE ENLIVE)  237 mL Oral BID BM  . guaiFENesin  600 mg Oral BID  . ipratropium  0.5 mg Nebulization TID  . levalbuterol  0.63 mg Nebulization TID  . mometasone-formoterol  2 puff Inhalation BID  . pantoprazole  40 mg Oral Daily  . polyethylene glycol  17 g Oral BID  . predniSONE  40 mg Oral Q breakfast  . pregabalin  150 mg Oral Q8H  . senna  1 tablet Oral QHS  . sodium chloride flush  10-40 mL Intracatheter Q12H  . Warfarin - Pharmacist Dosing Inpatient   Does not apply q1800   Continuous Infusions:   Principal Problem:   Acute on chronic respiratory failure with hypoxia (HCC) Active Problems:   ATRIAL FIBRILLATION, PAROXYSMAL   COPD with acute exacerbation (HCC)   Thrombocytopenia (HCC)   Physical deconditioning   Pulmonary embolus (HCC)   LOS: 4 days

## 2018-10-10 ENCOUNTER — Inpatient Hospital Stay (HOSPITAL_COMMUNITY): Payer: Medicare Other

## 2018-10-10 LAB — BLOOD GAS, ARTERIAL
Acid-Base Excess: 4.4 mmol/L — ABNORMAL HIGH (ref 0.0–2.0)
Bicarbonate: 27.3 mmol/L (ref 20.0–28.0)
Drawn by: 54666
O2 Content: 4 L/min
O2 Saturation: 91.8 %
PH ART: 7.527 — AB (ref 7.350–7.450)
Patient temperature: 98.6
pCO2 arterial: 33.1 mmHg (ref 32.0–48.0)
pO2, Arterial: 57.3 mmHg — ABNORMAL LOW (ref 83.0–108.0)

## 2018-10-10 LAB — CBC
HCT: 28.2 % — ABNORMAL LOW (ref 39.0–52.0)
HEMOGLOBIN: 9.1 g/dL — AB (ref 13.0–17.0)
MCH: 30.6 pg (ref 26.0–34.0)
MCHC: 32.3 g/dL (ref 30.0–36.0)
MCV: 94.9 fL (ref 80.0–100.0)
Platelets: 133 10*3/uL — ABNORMAL LOW (ref 150–400)
RBC: 2.97 MIL/uL — ABNORMAL LOW (ref 4.22–5.81)
RDW: 18.2 % — ABNORMAL HIGH (ref 11.5–15.5)
WBC: 11.2 10*3/uL — ABNORMAL HIGH (ref 4.0–10.5)
nRBC: 0.7 % — ABNORMAL HIGH (ref 0.0–0.2)

## 2018-10-10 LAB — PROTIME-INR
INR: 2.68
Prothrombin Time: 28.1 seconds — ABNORMAL HIGH (ref 11.4–15.2)

## 2018-10-10 MED ORDER — MORPHINE SULFATE (PF) 2 MG/ML IV SOLN
2.0000 mg | INTRAVENOUS | Status: DC | PRN
  Administered 2018-10-10 – 2018-10-15 (×25): 2 mg via INTRAVENOUS
  Filled 2018-10-10 (×25): qty 1

## 2018-10-10 MED ORDER — WARFARIN SODIUM 2 MG PO TABS
2.0000 mg | ORAL_TABLET | Freq: Once | ORAL | Status: AC
  Administered 2018-10-10: 2 mg via ORAL
  Filled 2018-10-10: qty 1

## 2018-10-10 MED ORDER — MORPHINE SULFATE (CONCENTRATE) 10 MG/0.5ML PO SOLN: 5.0000 mg | ORAL | Status: DC | PRN

## 2018-10-10 MED ORDER — PREDNISONE 10 MG PO TABS: 10.0000 mg | ORAL_TABLET | Freq: Every day | ORAL | Status: DC

## 2018-10-10 MED ORDER — LEVALBUTEROL HCL 0.63 MG/3ML IN NEBU
0.6300 mg | INHALATION_SOLUTION | Freq: Four times a day (QID) | RESPIRATORY_TRACT | Status: DC | PRN
  Administered 2018-10-10: 0.63 mg via RESPIRATORY_TRACT
  Filled 2018-10-10: qty 3

## 2018-10-10 MED ORDER — MORPHINE SULFATE (CONCENTRATE) 10 MG/0.5ML PO SOLN
5.0000 mg | ORAL | Status: DC | PRN
  Administered 2018-10-11 – 2018-10-15 (×2): 5 mg via ORAL
  Administered 2018-10-15: 10 mg via ORAL
  Filled 2018-10-10 (×3): qty 0.5

## 2018-10-10 MED ORDER — METHYLPREDNISOLONE SODIUM SUCC 40 MG IJ SOLR
40.0000 mg | Freq: Four times a day (QID) | INTRAMUSCULAR | Status: DC
  Administered 2018-10-10: 40 mg via INTRAVENOUS
  Filled 2018-10-10: qty 1

## 2018-10-10 MED ORDER — PREDNISONE 20 MG PO TABS
40.0000 mg | ORAL_TABLET | Freq: Every day | ORAL | Status: DC
  Administered 2018-10-11 – 2018-10-12 (×2): 40 mg via ORAL
  Filled 2018-10-10 (×2): qty 2

## 2018-10-10 MED ORDER — PREDNISONE 20 MG PO TABS: 40.0000 mg | ORAL_TABLET | Freq: Every day | ORAL | Status: DC

## 2018-10-10 NOTE — Progress Notes (Signed)
CSW was informed by Dr. Sarajane Jews that the patient is now requesting a hospice facility with [facility].   CSW called [facility] and started the referral. The on-call nurse would call the CSW back to start the process.   CSW is awaiting a phone call back.   Domenic Schwab, MSW, [place]

## 2018-10-10 NOTE — Progress Notes (Signed)
CSW has contacted [facility]. CSW faxed over patient H&P, MD latest progress notes, and face sheet.   Facility does not have a bed available today. CSW will continue to follow up daily with a bed availability.   CSW followed up with MD about patient prognosis. MD stated that the patient has weeks to live, CSW verified so that she could send patient information to the facility.   CSW went and spoke with the patient about faxing over his information.   Domenic Schwab, MSW, [place]

## 2018-10-10 NOTE — Progress Notes (Addendum)
PROGRESS NOTE  [name] [name] LFY:[id number] DOB: 09-Nov-1940 DOA: 10/04/2018 PCP: [name], MD  Brief History   78 year old man former smoker PMH COPD with frequent exacerbations, oxygen and prednisone dependent, presented with shortness of breath, of note recently hospitalized at [facility] for pneumonia and COPD exacerbation and discharged on doxycycline, liquid morphine for breathing and steroid who presented with shortness of breath and was admitted for acute on chronic hypoxic respiratory failure secondary to COPD exacerbation.  A & P  Acute bilateral pulmonary embolism with acute on chronic hypoxic respiratory failure.  Baseline requirement 2-3 L.  No evidence of RV strain by CT or echocardiogram.  Most likely precipitated by near bedbound status. --INR now within therapeutic range, continue warfarin per pharmacy  COPD exacerbation, chronic steroid dependency, chronic hypoxic respiratory failure --Increased respiratory effort now marked, appears much worse today, has audible gurgling suggesting difficulty clearing secretions. --N.p.o., changed IV steroids, continue bronchodilators and supplemental oxygen.  ABG shows a respiratory alkalosis, successful ability to ventilate but noted significant hypoxemia. --Patient would accept CPAP or BiPAP but not intubation.  Thrombocytopenia --Likely secondary to acute illness and PE.  Trending up.  No further evaluation suggested.  Sepsis considered on admission with elevated lactic acid and recent treatment for pneumonia however CT showed improving interstitial densities in the right lung  --Sepsis ruled out but could not rule out residual pneumonia.  Completed short course of antibiotics.  Repeat chest x-ray no acute disease.  Paroxysmal atrial fibrillation.  Eliquis previously discontinued secondary to epistaxis. --Remains in sinus rhythm.  Continue diltiazem.  History of anginal chest pain as recorded by Dr. Domenic Polite 07/2018.  Reassuring  Myoview in August. --No chest pain, EKG nonacute.  Resume aspirin, continue nitroglycerin as needed.  Generalized debility --PT/OT   Patient appears significantly worse, will increase steroids and continue current respiratory treatments.  Consideration will be given to BiPAP versus CPAP depending on course.  Patient declines intubation but would accept CPR.  Prognosis is guarded at this point, reports he desires to be comfortable and allowed to pass if his condition worsens.  "My family needs to realize they need to cut me loose".  His wife is in route and there is no answer her cell phone at this point.  Will discuss when she arrives.  Addendum 1300 Patient feels somewhat better and is breathing better. On exam he appears clearly better although still dyspneic, he is able to speak in complete sentences and has no audible upper airway noise or gurgling.  Chest x-ray suggests right pulmonary infarction.  EKG nonacute.  Wife at bedside, the patient reports that he is tired and suffering and would not want to escalate care.  After further thought, he does not desire CPR or shock.  His wife concurs and notes that he has expressed this in the past as well.  I suggested hospice and the patient will consider this and discuss with his family this evening.  He appears to be a good candidate for residential hospice and certainly for home hospice.  At this point will continue current treatments but not escalate care.  If his condition declines, will make full comfort care.  ADDENDUM 1545 Family asked me to return to discuss.  Patient, wife and 2 other family members at bedside.  Patient desires to go to residential hospice in [facility] and desires comfort measures only, no escalation of current care.  Wife and family members at bedside agree with his decision. 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  Discussed with CSW, if  patient accepted, will discharge to residential hospice when bed available. Full comfort care  DVT prophylaxis: Warfarin  Code Status: DNR/full comfort care Family Communication: Wife at bedside. Disposition Plan: Residential hospice   [name], MD  [facility] Direct contact: see [email or web address]  7PM-7AM contact night coverage as above 10/10/2018, 12:28 PM  LOS: 5 days   Consultants  .   Procedures  . Echo Study Conclusions  - Procedure narrative: Transthoracic echocardiography. Image   quality was adequate. The study was technically difficult. - Left ventricle: The cavity size was normal. Wall thickness was   increased in a pattern of mild LVH. Systolic function was   vigorous. The estimated ejection fraction was in the range of 65%   to 70%. Wall motion was normal; there were no regional wall   motion abnormalities. Doppler parameters are consistent with   abnormal left ventricular relaxation (grade 1 diastolic   dysfunction). The E/e&' ratio is between 8-15, suggesting elevated   LV filling pressure. - Mitral valve: Mildly thickened leaflets . There was trivial   regurgitation. - Left atrium: The atrium was normal in size. - Tricuspid valve: There was trivial regurgitation. - Pulmonary arteries: PA peak pressure: 36 mm Hg (S). - Inferior vena cava: The vessel was normal in size. The   respirophasic diameter changes were in the normal range (>= 50%),   consistent with normal central venous pressure.  Impressions:  - Technically difficult study. Hyperdynamic LV. Probably no   signifcant change compared to prior study in 11/2017.  Antibiotics  .   Interval History/Subjective  Patient prefers to go home rather than to SNF. Remains short of breath, not sleeping well at night, eating okay.  No nausea or vomiting. Later this a.m. near noon developed worsening shortness of breath.  RT was unable to nasotracheal suction as patient has a history of broken  nose.  Objective   Vitals:  Vitals:   10/10/18 0737 10/10/18 0810  BP:  108/60  Pulse:  84  Resp:  12  Temp:  97.6 F (36.4 C)  SpO2: 97% 99%    Exam:  Constitutional:   . Appears acutely ill, short of breath, tachypneic Eyes:  . pupils and irises appear normal . Normal lids  ENMT:  . grossly normal hearing  . Lips appear normal Respiratory:  . Fair air movement, audible upper airway noise, gurgling . Respiratory effort moderate to severely increased, tachypneic, can speak in short sentences. Cardiovascular:  . RRR, no m/r/g . No LE extremity edema   Abdomen:  . Soft, nontender, nondistended Skin:  . No rashes, lesions, ulcers . palpation of skin: no induration or nodules .  Psychiatric:  . Mental status o Mood, affect appropriate . judgment and insight appear intact    I have personally reviewed the following:   Today's Data  . ABG 7.527/30 3/57 . Hemoglobin stable 9.1, WBC stable 11.2, platelets trending up, 133.  INR 2.68. Marland Kitchen EKG independently reviewed shows sinus tachycardia, no acute changes. . Chest x-ray independently reviewed no acute changes.  Await radiology interpretation.  Lab Data  . noted  Micro Data  . noted  Imaging  . CXR and CTA chest noted  Cardiology Data  . Sinus tachycardia, nonspecific ST changes  Other Data  .   Scheduled Meds: . diltiazem  120 mg Oral QPM  . diltiazem  240 mg  Oral q morning - 10a  . feeding supplement (ENSURE ENLIVE)  237 mL Oral BID BM  . guaiFENesin  600 mg Oral BID  . ipratropium  0.5 mg Nebulization TID  . levalbuterol  0.63 mg Nebulization TID  . methylPREDNISolone (SOLU-MEDROL) injection  40 mg Intravenous Q6H  . mometasone-formoterol  2 puff Inhalation BID  . pantoprazole  40 mg Oral Daily  . polyethylene glycol  17 g Oral BID  . pregabalin  150 mg Oral Q8H  . senna  1 tablet Oral QHS  . sodium chloride flush  10-40 mL Intracatheter Q12H  . warfarin  2 mg Oral ONCE-1800  . Warfarin -  Pharmacist Dosing Inpatient   Does not apply q1800   Continuous Infusions:   Principal Problem:   Acute on chronic respiratory failure with hypoxia (HCC) Active Problems:   ATRIAL FIBRILLATION, PAROXYSMAL   COPD with acute exacerbation (HCC)   Thrombocytopenia (HCC)   Physical deconditioning   Pulmonary embolus (HCC)   LOS: 5 days

## 2018-10-10 NOTE — Progress Notes (Signed)
ANTICOAGULATION CONSULT NOTE - Follow Up Consult  Pharmacy Consult for warfarin Indication: atrial fibrillation and pulmonary embolus  Allergies  Allergen Reactions  . Albuterol Other (See Comments)    Continuous nebulization caused extreme tachycardia. May use the inhaler.    Patient Measurements: Height: 5\' 10"  (177.8 cm) Weight: 130 lb (59 kg) IBW/kg (Calculated) : 73  Vital Signs: Temp: 97.6 F (36.4 C) (01/26 0810) Temp Source: Oral (01/26 0523) BP: 108/60 (01/26 0810) Pulse Rate: 84 (01/26 0810)  Labs: Recent Labs    10/08/18 0245 10/09/18 0526 10/10/18 0230  HGB 9.4* 9.6* 9.1*  HCT 29.3* 30.0* 28.2*  PLT 121* 130* 133*  LABPROT 45.9* 34.9* 28.1*  INR 5.04* 3.54 2.68    Estimated Creatinine Clearance: 64.5 mL/min (by C-G formula based on SCr of 0.63 mg/dL).   Medications:  Scheduled:  . diltiazem  120 mg Oral QPM  . diltiazem  240 mg Oral q morning - 10a  . feeding supplement (ENSURE ENLIVE)  237 mL Oral BID BM  . guaiFENesin  600 mg Oral BID  . ipratropium  0.5 mg Nebulization TID  . levalbuterol  0.63 mg Nebulization TID  . mometasone-formoterol  2 puff Inhalation BID  . pantoprazole  40 mg Oral Daily  . polyethylene glycol  17 g Oral BID  . [START ON 10/11/2018] predniSONE  40 mg Oral Q breakfast  . pregabalin  150 mg Oral Q8H  . senna  1 tablet Oral QHS  . sodium chloride flush  10-40 mL Intracatheter Q12H  . Warfarin - Pharmacist Dosing Inpatient   Does not apply q1800   Infusions:    Assessment: 78 year old male known to pharmacy for IV Heparin dosing in setting of bilateral pulmonary embolism. Consulted pharmacy to start warfarin dosing per protocol. Note patient has been on Eliquis in the past but stopped therapy due to recurring nosebleeds. Decision was made to start warfarin therapy with plan for 3-6 months of anticoagulation for pulmonary embolism with monitoring of INR and for signs and symptoms of bleeding.   INR had become  SUPRAtherapeutic,  Decreasing to a therapeutic 2.68 today. CBC stable, plts 133., no bleeding noted. Will restart at lower dose tonight.     Patient also received fluconazole x 1 dose on 1/21. Also, was on both cefdinir and methylpred that can increase the effects of warfarin therapy.   Goal of Therapy:  INR 2-3 Monitor platelets by anticoagulation protocol: Yes   Plan:  Warfarin 2mg  PO x 1 tonight Monitor daily INR, CBC, clinical course, s/sx of bleed, PO intake, DDI   Rosetta Rupnow A. Levada Dy, PharmD, [place] Pager: [phone] Please utilize Amion for appropriate phone number to reach the unit pharmacist ([place])   10/10/2018, 10:40 AM

## 2018-10-11 LAB — PROTIME-INR
INR: 2.55
Prothrombin Time: 27.1 seconds — ABNORMAL HIGH (ref 11.4–15.2)

## 2018-10-11 MED ORDER — WARFARIN SODIUM 2.5 MG PO TABS
2.5000 mg | ORAL_TABLET | Freq: Once | ORAL | Status: AC
  Administered 2018-10-11: 2.5 mg via ORAL
  Filled 2018-10-11: qty 1

## 2018-10-11 NOTE — Progress Notes (Signed)
PROGRESS NOTE  SKIP LITKE YBO:[id number] DOB: 01/12/1941 DOA: 10/04/2018 PCP: [name], MD  Brief History   78 year old man former smoker PMH COPD with frequent exacerbations, oxygen and prednisone dependent, presented with shortness of breath, of note recently hospitalized at [facility] for pneumonia and COPD exacerbation and discharged on doxycycline, liquid morphine for breathing and steroid who presented with shortness of breath and was admitted for acute on chronic hypoxic respiratory failure secondary to COPD exacerbation.  A & P  Acute bilateral pulmonary embolism with acute on chronic hypoxic respiratory failure.  Baseline requirement 2-3 L.  No evidence of RV strain by CT or echocardiogram.  Most likely precipitated by near bedbound status. --Oxygen requirement stable.  INR therapeutic.  Continue warfarin per pharmacy.  COPD exacerbation, chronic steroid dependency, chronic hypoxic respiratory failure --Condition waxes and wanes with periods of significant increased work of breathing and difficulty handling secretions followed by relatively calm periods.   --Continue supplemental oxygen, bronchodilators, steroids.  No escalation of care.  Thrombocytopenia --Likely secondary to acute illness and PE.  Trending up.  No further evaluation suggested.  Sepsis considered on admission with elevated lactic acid and recent treatment for pneumonia however CT showed improving interstitial densities in the right lung  --Sepsis ruled out but could not rule out residual pneumonia.  Completed short course of antibiotics.    Paroxysmal atrial fibrillation.  Eliquis previously discontinued secondary to epistaxis. --Continue diltiazem  History of anginal chest pain as recorded by Dr. Domenic Polite 07/2018.  Reassuring Myoview in August. --No chest pain, EKG nonacute.  Continue nitroglycerin as needed.  Generalized debility --PT/OT   Somewhat better today the condition remains tenuous and  prognosis is guarded.  Currently being evaluated by [facility] residential hospice.  Stable for discharge today if bed becomes available. Continue comfort care  DVT prophylaxis: Warfarin  Code Status: DNR/full comfort care Family Communication:  Disposition Plan: Recommend residential hospice   [name], MD  [facility] Direct contact: see [email or web address]  7PM-7AM contact night coverage as above 10/11/2018, 2:11 PM  LOS: 6 days   Consultants  .   Procedures  . Echo Study Conclusions  - Procedure narrative: Transthoracic echocardiography. Image   quality was adequate. The study was technically difficult. - Left ventricle: The cavity size was normal. Wall thickness was   increased in a pattern of mild LVH. Systolic function was   vigorous. The estimated ejection fraction was in the range of 65%   to 70%. Wall motion was normal; there were no regional wall   motion abnormalities. Doppler parameters are consistent with   abnormal left ventricular relaxation (grade 1 diastolic   dysfunction). The E/e&' ratio is between 8-15, suggesting elevated   LV filling pressure. - Mitral valve: Mildly thickened leaflets . There was trivial   regurgitation. - Left atrium: The atrium was normal in size. - Tricuspid valve: There was trivial regurgitation. - Pulmonary arteries: PA peak pressure: 36 mm Hg (S). - Inferior vena cava: The vessel was normal in size. The   respirophasic diameter changes were in the normal range (>= 50%),   consistent with normal central venous pressure.  Impressions:  - Technically difficult study. Hyperdynamic LV. Probably no   signifcant change compared to prior study in 11/2017.  Antibiotics  .   Interval History/Subjective  Had a pretty good night, breathing a little bit better today.  Morphine helping.  Objective   Vitals:  Vitals:   10/11/18 0748 10/11/18 1305  BP: 109/63  Pulse: 93   Resp: 13   Temp: (!) 97.5 F (36.4 C)     SpO2: 94% 95%    Exam:   I have personally reviewed the following:   Today's Data  . INR 2.55  Lab Data  . noted  Micro Data  . noted  Imaging  . CXR and CTA chest noted  Cardiology Data  . Sinus tachycardia, nonspecific ST changes  Other Data  .   Scheduled Meds: . diltiazem  120 mg Oral QPM  . diltiazem  240 mg Oral q morning - 10a  . feeding supplement (ENSURE ENLIVE)  237 mL Oral BID BM  . guaiFENesin  600 mg Oral BID  . ipratropium  0.5 mg Nebulization TID  . levalbuterol  0.63 mg Nebulization TID  . mometasone-formoterol  2 puff Inhalation BID  . pantoprazole  40 mg Oral Daily  . polyethylene glycol  17 g Oral BID  . predniSONE  40 mg Oral Q breakfast  . pregabalin  150 mg Oral Q8H  . senna  1 tablet Oral QHS  . sodium chloride flush  10-40 mL Intracatheter Q12H  . warfarin  2.5 mg Oral ONCE-1800  . Warfarin - Pharmacist Dosing Inpatient   Does not apply q1800   Continuous Infusions:   Principal Problem:   Acute on chronic respiratory failure with hypoxia (HCC) Active Problems:   ATRIAL FIBRILLATION, PAROXYSMAL   COPD with acute exacerbation (HCC)   Thrombocytopenia (HCC)   Physical deconditioning   Pulmonary embolus (HCC)   LOS: 6 days

## 2018-10-11 NOTE — Progress Notes (Signed)
ANTICOAGULATION CONSULT NOTE - Follow Up Consult  Pharmacy Consult for warfarin Indication: atrial fibrillation and pulmonary embolus  Allergies  Allergen Reactions  . Albuterol Other (See Comments)    Continuous nebulization caused extreme tachycardia. May use the inhaler.   Patient Measurements: Height: 5\' 10"  (177.8 cm) Weight: 130 lb (59 kg) IBW/kg (Calculated) : 73  Vital Signs: Temp: 97.5 F (36.4 C) (01/27 0748) Temp Source: Oral (01/26 2253) BP: 109/63 (01/27 0748) Pulse Rate: 93 (01/27 0748)  Labs: Recent Labs    10/09/18 0526 10/10/18 0230 10/11/18 0227  HGB 9.6* 9.1*  --   HCT 30.0* 28.2*  --   PLT 130* 133*  --   LABPROT 34.9* 28.1* 27.1*  INR 3.54 2.68 2.55    Estimated Creatinine Clearance: 64.5 mL/min (by C-G formula based on SCr of 0.63 mg/dL).   Medications:  Scheduled:  . diltiazem  120 mg Oral QPM  . diltiazem  240 mg Oral q morning - 10a  . feeding supplement (ENSURE ENLIVE)  237 mL Oral BID BM  . guaiFENesin  600 mg Oral BID  . ipratropium  0.5 mg Nebulization TID  . levalbuterol  0.63 mg Nebulization TID  . mometasone-formoterol  2 puff Inhalation BID  . pantoprazole  40 mg Oral Daily  . polyethylene glycol  17 g Oral BID  . predniSONE  40 mg Oral Q breakfast  . pregabalin  150 mg Oral Q8H  . senna  1 tablet Oral QHS  . sodium chloride flush  10-40 mL Intracatheter Q12H  . warfarin  2.5 mg Oral ONCE-1800  . Warfarin - Pharmacist Dosing Inpatient   Does not apply q1800   Infusions:    Assessment: 78 year old male found to have bilateral PE, started on heparin gtt. Also has Afib with CHADsVASC = 3. No AC PTA (pt refused in past), prior nosebleeds with Eliquis, pt requested Coumadin. INR jumped quickly on Coumadin and now back down to 2.55. Hgb 9.1, plts wnl.   Goal of Therapy:  INR 2-3 Monitor platelets by anticoagulation protocol: Yes   Plan:  Give Coumadin 2.5mg  PO x 1 Monitor daily INR, CBC, s/s of bleed May need to add  heparin back if INR continues to drop  Elenor Quinones, PharmD, [place], [facility] Clinical Pharmacist Phone number [phone] 10/11/2018 8:16 AM

## 2018-10-11 NOTE — Clinical Social Work Note (Signed)
[name] [name] has been placed on [facility] wait list at this time. NO bed available today.  [place], [place]

## 2018-10-11 NOTE — Care Management Important Message (Signed)
Important Message  Patient Details  Name: [name] [name] MRN: [id number] Date of Birth: 11/07/1940   Medicare Important Message Given:  Yes    Nishtha Raider [address]ine Circle 10/11/2018, 3:52 PM

## 2018-10-11 NOTE — Clinical Social Work Note (Signed)
CSW received a call from [place] with the [place] [facility] and she requested updated MD note with prognosis. Fax number confirmed and fax sent. [name] Doctor, general practice will review.   [place], [place]

## 2018-10-12 LAB — PROTIME-INR
INR: 3.63
Prothrombin Time: 35.6 seconds — ABNORMAL HIGH (ref 11.4–15.2)

## 2018-10-12 MED ORDER — PREDNISONE 20 MG PO TABS
20.0000 mg | ORAL_TABLET | Freq: Every day | ORAL | Status: DC
  Administered 2018-10-13 – 2018-10-15 (×3): 20 mg via ORAL
  Filled 2018-10-12 (×3): qty 1

## 2018-10-12 NOTE — Progress Notes (Signed)
Nutrition Follow Up/Brief Note  Chart reviewed. Pt transitioned to comfort care; plan for residential Hospice. No further nutrition interventions warranted at this time.  Please re-consult as needed.   Arthur Holms, RD, LDN Pager #: [phone] After-Hours Pager #: [phone]

## 2018-10-12 NOTE — Progress Notes (Signed)
ANTICOAGULATION CONSULT NOTE - Follow Up Consult  Pharmacy Consult for warfarin Indication: atrial fibrillation and pulmonary embolus  Allergies  Allergen Reactions  . Albuterol Other (See Comments)    Continuous nebulization caused extreme tachycardia. May use the inhaler.   Patient Measurements: Height: 5\' 10"  (177.8 cm) Weight: 130 lb (59 kg) IBW/kg (Calculated) : 73  Vital Signs: Temp: 97.6 F (36.4 C) (01/28 0802) BP: 98/59 (01/28 0802) Pulse Rate: 107 (01/28 0802)  Labs: Recent Labs    10/10/18 0230 10/11/18 0227 10/12/18 0226  HGB 9.1*  --   --   HCT 28.2*  --   --   PLT 133*  --   --   LABPROT 28.1* 27.1* 35.6*  INR 2.68 2.55 3.63    Estimated Creatinine Clearance: 64.5 mL/min (by C-G formula based on SCr of 0.63 mg/dL).   Medications:  Scheduled:  . diltiazem  120 mg Oral QPM  . diltiazem  240 mg Oral q morning - 10a  . feeding supplement (ENSURE ENLIVE)  237 mL Oral BID BM  . guaiFENesin  600 mg Oral BID  . ipratropium  0.5 mg Nebulization TID  . levalbuterol  0.63 mg Nebulization TID  . mometasone-formoterol  2 puff Inhalation BID  . pantoprazole  40 mg Oral Daily  . polyethylene glycol  17 g Oral BID  . predniSONE  40 mg Oral Q breakfast  . pregabalin  150 mg Oral Q8H  . senna  1 tablet Oral QHS  . sodium chloride flush  10-40 mL Intracatheter Q12H  . Warfarin - Pharmacist Dosing Inpatient   Does not apply q1800   Infusions:    Assessment: 78 year old male found to have bilateral PE, initially started on  heparin gtt. Also has Afib with CHADsVASC = 3. No AC PTA (pt refused in past), prior nosebleeds with Eliquis, pt requested Coumadin. INR jumped quickly on Coumadin up to 5.04 , dose was held x 3 days 1/23-1/25then back down to 2.55>> INR is back up to 3.63 supratherapeutic level today after lower doses given.   Hgb 9.1, plts wnl on 1/26. No bleeding reported  Goal of Therapy:  INR 2-3 Monitor platelets by anticoagulation protocol: Yes    Plan:  Hold Coumadin today due to supratherapeutic INR. Monitor daily INR, CBC, s/s of bleed  Thank you for allowing pharmacy to be part of this patients care team.  Nicole Cella, [place] Pharmacist Phone number [phone] Please check AMION for all [place] phone numbers After 10:00 PM, call [place] [phone] 10/12/2018 12:40 PM

## 2018-10-12 NOTE — Progress Notes (Signed)
PROGRESS NOTE  [name] [name]:[id number] DOB: November 05, 1940 DOA: 10/04/2018 PCP: [name], MD  Brief History   78 year old man former smoker PMH COPD with frequent exacerbations, oxygen and prednisone dependent, presented with shortness of breath, of note recently hospitalized at [facility] for pneumonia and COPD exacerbation and discharged on doxycycline, liquid morphine for breathing and steroid who presented with shortness of breath and was admitted for acute on chronic hypoxic respiratory failure secondary to COPD exacerbation.  A & P  Acute bilateral pulmonary embolism with acute on chronic hypoxic respiratory failure.  Baseline requirement 2-3 L.  No evidence of RV strain by CT or echocardiogram.  Most likely precipitated by near bedbound status. --Overall remains labile although INR did increase.  Continue warfarin per pharmacy.    COPD exacerbation, chronic steroid dependency, chronic hypoxic respiratory failure --Condition waxes and wanes with periods of significant increased work of breathing and difficulty handling secretions followed by relatively calm periods.   --Continue supplemental oxygen, bronchodilators, steroids.  No escalation of care.  Thrombocytopenia --Likely secondary to acute illness and PE.  Trending up.  No further evaluation suggested.  Sepsis considered on admission with elevated lactic acid and recent treatment for pneumonia however CT showed improving interstitial densities in the right lung  --Sepsis ruled out but could not rule out residual pneumonia.  Completed short course of antibiotics.    Paroxysmal atrial fibrillation.  Eliquis previously discontinued secondary to epistaxis. --Continue diltiazem  History of anginal chest pain as recorded by Dr. Domenic Polite 07/2018.  Reassuring Myoview in August. --No chest pain, EKG nonacute.  Continue nitroglycerin as needed.  Generalized debility --PT/OT   Condition appears to be stabilizing.   Unfortunately no residential hospice rooms available at the current moment.  Will discuss with family.  DVT prophylaxis: Warfarin  Code Status: DNR/full comfort care Family Communication:  Disposition Plan: Recommend residential hospice   [name], MD  [facility] Direct contact: see [email or web address]  7PM-7AM contact night coverage as above 10/12/2018, 3:04 PM  LOS: 7 days   Consultants  .   Procedures  . Echo Study Conclusions  - Procedure narrative: Transthoracic echocardiography. Image   quality was adequate. The study was technically difficult. - Left ventricle: The cavity size was normal. Wall thickness was   increased in a pattern of mild LVH. Systolic function was   vigorous. The estimated ejection fraction was in the range of 65%   to 70%. Wall motion was normal; there were no regional wall   motion abnormalities. Doppler parameters are consistent with   abnormal left ventricular relaxation (grade 1 diastolic   dysfunction). The E/e&' ratio is between 8-15, suggesting elevated   LV filling pressure. - Mitral valve: Mildly thickened leaflets . There was trivial   regurgitation. - Left atrium: The atrium was normal in size. - Tricuspid valve: There was trivial regurgitation. - Pulmonary arteries: PA peak pressure: 36 mm Hg (S). - Inferior vena cava: The vessel was normal in size. The   respirophasic diameter changes were in the normal range (>= 50%),   consistent with normal central venous pressure.  Impressions:  - Technically difficult study. Hyperdynamic LV. Probably no   signifcant change compared to prior study in 11/2017.  Antibiotics  .   Interval History/Subjective  Feels okay today.  Breathing okay today.  Objective   Vitals:  Vitals:   10/12/18 0827 10/12/18 1337  BP:    Pulse:    Resp:    Temp:    SpO2:  93% 92%    Exam: Constitutional:   . Appears calm and comfortable Respiratory:  . CTA bilaterally, no w/r/r.   . Respiratory effort normal.  Cardiovascular:  . RRR, no m/r/g . No LE extremity edema   Psychiatric:  . Mental status o Mood, affect appropriate  I have personally reviewed the following:   Today's Data  . INR 3.63  Lab Data  . noted  Micro Data  . noted  Imaging  . CXR and CTA chest noted  Cardiology Data  . Sinus tachycardia, nonspecific ST changes  Other Data  .   Scheduled Meds: . diltiazem  120 mg Oral QPM  . diltiazem  240 mg Oral q morning - 10a  . feeding supplement (ENSURE ENLIVE)  237 mL Oral BID BM  . guaiFENesin  600 mg Oral BID  . ipratropium  0.5 mg Nebulization TID  . levalbuterol  0.63 mg Nebulization TID  . mometasone-formoterol  2 puff Inhalation BID  . pantoprazole  40 mg Oral Daily  . polyethylene glycol  17 g Oral BID  . predniSONE  40 mg Oral Q breakfast  . pregabalin  150 mg Oral Q8H  . senna  1 tablet Oral QHS  . sodium chloride flush  10-40 mL Intracatheter Q12H  . Warfarin - Pharmacist Dosing Inpatient   Does not apply q1800   Continuous Infusions:   Principal Problem:   Acute on chronic respiratory failure with hypoxia (HCC) Active Problems:   ATRIAL FIBRILLATION, PAROXYSMAL   COPD with acute exacerbation (HCC)   Thrombocytopenia (HCC)   Physical deconditioning   Pulmonary embolus (HCC)   LOS: 7 days

## 2018-10-13 LAB — PROTIME-INR
INR: 4.5
Prothrombin Time: 42.1 seconds — ABNORMAL HIGH (ref 11.4–15.2)

## 2018-10-13 MED ORDER — OXYCODONE HCL ER 10 MG PO T12A
10.0000 mg | EXTENDED_RELEASE_TABLET | Freq: Two times a day (BID) | ORAL | Status: DC
  Administered 2018-10-13 – 2018-10-15 (×5): 10 mg via ORAL
  Filled 2018-10-13 (×5): qty 1

## 2018-10-13 MED ORDER — DILTIAZEM HCL ER COATED BEADS 120 MG PO CP24
120.0000 mg | ORAL_CAPSULE | Freq: Every evening | ORAL | Status: AC
  Administered 2018-10-13: 120 mg via ORAL
  Filled 2018-10-13: qty 1

## 2018-10-13 MED ORDER — LORAZEPAM 0.5 MG PO TABS: 0.5000 mg | ORAL_TABLET | Freq: Four times a day (QID) | ORAL | Status: DC | PRN

## 2018-10-13 MED ORDER — DILTIAZEM HCL ER COATED BEADS 180 MG PO CP24
360.0000 mg | ORAL_CAPSULE | Freq: Every day | ORAL | Status: DC
  Administered 2018-10-14: 360 mg via ORAL
  Filled 2018-10-13: qty 2

## 2018-10-13 MED ORDER — ZOLPIDEM TARTRATE 5 MG PO TABS
5.0000 mg | ORAL_TABLET | Freq: Every day | ORAL | Status: DC
  Administered 2018-10-13 – 2018-10-14 (×2): 5 mg via ORAL
  Filled 2018-10-13 (×2): qty 1

## 2018-10-13 NOTE — Progress Notes (Signed)
ANTICOAGULATION CONSULT NOTE - Follow Up Consult  Pharmacy Consult for warfarin Indication: atrial fibrillation and pulmonary embolus  Allergies  Allergen Reactions  . Albuterol Other (See Comments)    Continuous nebulization caused extreme tachycardia. May use the inhaler.   Patient Measurements: Height: 5\' 10"  (177.8 cm) Weight: 130 lb (59 kg) IBW/kg (Calculated) : 73  Vital Signs: Temp: 97.6 F (36.4 C) (01/29 0744) Temp Source: Oral (01/29 0744) BP: 98/54 (01/29 0744) Pulse Rate: 85 (01/29 0744)  Labs: Recent Labs    10/11/18 0227 10/12/18 0226 10/13/18 0251  LABPROT 27.1* 35.6* 42.1*  INR 2.55 3.63 4.50*    Estimated Creatinine Clearance: 64.5 mL/min (by C-G formula based on SCr of 0.63 mg/dL).   Medications:  Scheduled:  . diltiazem  120 mg Oral QPM  . diltiazem  240 mg Oral q morning - 10a  . feeding supplement (ENSURE ENLIVE)  237 mL Oral BID BM  . guaiFENesin  600 mg Oral BID  . ipratropium  0.5 mg Nebulization TID  . levalbuterol  0.63 mg Nebulization TID  . mometasone-formoterol  2 puff Inhalation BID  . pantoprazole  40 mg Oral Daily  . polyethylene glycol  17 g Oral BID  . predniSONE  20 mg Oral Q breakfast  . pregabalin  150 mg Oral Q8H  . senna  1 tablet Oral QHS  . sodium chloride flush  10-40 mL Intracatheter Q12H  . Warfarin - Pharmacist Dosing Inpatient   Does not apply q1800   Infusions:    Assessment: 78 year old male found to have bilateral PE, initially started on  heparin gtt. Also has Afib with CHADsVASC = 3. No AC PTA (pt refused in past), prior nosebleeds with Eliquis, pt requested Coumadin. INR jumped quickly on Coumadin up to 5.04 , dose was held x 3 days 1/23-1/25then back down to 2.55>> 3.63 yesterday.  Warfarin dose held on 1/28.  Today INR is up to 4.50, supratherapeutic Hgb 9.1, plts wnl on 1/26. No bleeding reported Meal intake : ?  -  taking  237 ml Ensure BID BM,  last meal charted on 10/08/18.  Goal of Therapy:  INR  2-3 Monitor platelets by anticoagulation protocol: Yes   Plan:  Hold Coumadin today due to supratherapeutic INR. Monitor daily INR, CBC, s/s of bleed  Thank you for allowing pharmacy to be part of this patients care team.  Nicole Cella, [place] Pharmacist Phone number [phone] Please check AMION for all [place] phone numbers After 10:00 PM, call [place] [phone] 10/13/2018 10:58 AM

## 2018-10-13 NOTE — Progress Notes (Signed)
PROGRESS NOTE  SARP VERNIER WEX:[id number] DOB: 12/06/1940 DOA: 10/04/2018 PCP: [name], MD  Brief History   78 year old man former smoker PMH COPD with frequent exacerbations, oxygen and prednisone dependent, presented with shortness of breath, of note recently hospitalized at [facility] for pneumonia and COPD exacerbation and discharged on doxycycline, liquid morphine for breathing and steroid who presented with shortness of breath and was admitted for acute on chronic hypoxic respiratory failure secondary to COPD exacerbation.  A & P  Acute bilateral pulmonary embolism with acute on chronic hypoxic respiratory failure.  Baseline requirement 2-3 L.  No evidence of RV strain by CT or echocardiogram.  Most likely precipitated by near bedbound status. --Overall remains labile although INR did increase.  Continue warfarin per pharmacy.   -Family regarding choice of anticoagulation.  Patient was on Eliquis for A. fib, in April 2019 PCP discontinued this medicine with concern for drop in hemoglobin, patient did not have any melena or any GI bleed reported.  There was no epistaxis.  Per family there was some concern about internal bleeding. I informed them that whether it is Eliquis or Coumadin if the patient has internal bleeding history he will bleed again therefore that would not be the reason to choose a particular anticoagulation. Also concerned that the while the patient's goal now is to remain comfortable and go on hospice I question the rationality of continuing anticoagulation with the concern for ongoing bleeding. Family will consider these options. Verified with the hospice facility that they will be okay to continue warfarin on discharge.  COPD exacerbation, chronic steroid dependency, chronic hypoxic respiratory failure --Condition waxes and wanes with periods of significant increased work of breathing and difficulty handling secretions followed by relatively calm periods.     --Continue supplemental oxygen, bronchodilators, steroids.  No escalation of care.  Thrombocytopenia --Likely secondary to acute illness and PE.  Trending up.  No further evaluation suggested.  Sepsis considered on admission with elevated lactic acid and recent treatment for pneumonia however CT showed improving interstitial densities in the right lung  --Sepsis ruled out but could not rule out residual pneumonia.  Completed short course of antibiotics.    Paroxysmal atrial fibrillation.  Eliquis previously discontinued secondary to epistaxis. --Continue diltiazem  History of anginal chest pain as recorded by Dr. Domenic Polite 07/2018.  Reassuring Myoview in August. Continues to report severe chest pain, EKG nonacute.  Continue nitroglycerin as needed. We will add scheduled OxyContin. Used 18 mg of IV morphine in last 24 hours.  14 mg prior to that.  Generalized debility Home with hospice.  Goals of care discussion. I extensively discussed with family and patient regarding their goal of care. Current understanding is that we would like to be discharged with hospice. Patient has uncontrolled symptoms requiring multiple rounds of IV morphine for last 48 hours which would justify for residential hospice placement for symptom control as the burden of shortness of breath and pain is very high. For now I will transition him to scheduled OxyContin along with continuing IV morphine and monitor his response. I also explained in detail regarding hospice and hospice philosophy as they were requesting to continue anticoagulation on discharge. It appears that the understood my explanation. For now the goal is to continue comfort care here in the hospital, continue to focus on symptom control. Continue Coumadin but consider transitioning to other type of anticoagulation or no anticoagulation. If the symptoms are not controlled control in the next 24 hours consider transitioning to  residential hospice  facility for symptom control   DVT prophylaxis: Warfarin  Code Status: DNR/full comfort care Family Communication:  Disposition Plan: Recommend residential hospice  Consultants  .   Procedures  . Echo Study Conclusions  - Procedure narrative: Transthoracic echocardiography. Image   quality was adequate. The study was technically difficult. - Left ventricle: The cavity size was normal. Wall thickness was   increased in a pattern of mild LVH. Systolic function was   vigorous. The estimated ejection fraction was in the range of 65%   to 70%. Wall motion was normal; there were no regional wall   motion abnormalities. Doppler parameters are consistent with   abnormal left ventricular relaxation (grade 1 diastolic   dysfunction). The E/e&' ratio is between 8-15, suggesting elevated   LV filling pressure. - Mitral valve: Mildly thickened leaflets . There was trivial   regurgitation. - Left atrium: The atrium was normal in size. - Tricuspid valve: There was trivial regurgitation. - Pulmonary arteries: PA peak pressure: 36 mm Hg (S). - Inferior vena cava: The vessel was normal in size. The   respirophasic diameter changes were in the normal range (>= 50%),   consistent with normal central venous pressure.  Impressions:  - Technically difficult study. Hyperdynamic LV. Probably no   signifcant change compared to prior study in 11/2017.  Antibiotics  .   Interval History/Subjective  Continues to report chest pain.  Continues to have shortness of breath.  Severely anxious.  Unable to sleep last night.  No nausea no vomiting.  No diarrhea.  No blood in the stool.  Objective   Vitals:  Vitals:   10/13/18 1413 10/13/18 1651  BP:  102/62  Pulse:  (!) 101  Resp:    Temp:  97.9 F (36.6 C)  SpO2: 97% 100%    Exam: Constitutional:   . Appears calm and comfortable Respiratory:  . CTA bilaterally, no w/r/r.  . Respiratory effort normal.  Cardiovascular:  . RRR, no  m/r/g . No LE extremity edema   Psychiatric:  . Mental status o Mood, affect appropriate  I have personally reviewed the following:   Today's Data  . INR 3.63  Lab Data  . noted  Micro Data  . noted  Imaging  . CXR and CTA chest noted  Cardiology Data  . Sinus tachycardia, nonspecific ST changes  Other Data  .   Scheduled Meds: . [START ON 10/14/2018] diltiazem  360 mg Oral Daily  . feeding supplement (ENSURE ENLIVE)  237 mL Oral BID BM  . guaiFENesin  600 mg Oral BID  . ipratropium  0.5 mg Nebulization TID  . levalbuterol  0.63 mg Nebulization TID  . mometasone-formoterol  2 puff Inhalation BID  . oxyCODONE  10 mg Oral Q12H  . pantoprazole  40 mg Oral Daily  . polyethylene glycol  17 g Oral BID  . predniSONE  20 mg Oral Q breakfast  . pregabalin  150 mg Oral Q8H  . senna  1 tablet Oral QHS  . sodium chloride flush  10-40 mL Intracatheter Q12H  . Warfarin - Pharmacist Dosing Inpatient   Does not apply q1800  . zolpidem  5 mg Oral QHS   Continuous Infusions:   Principal Problem:   Acute on chronic respiratory failure with hypoxia (HCC) Active Problems:   ATRIAL FIBRILLATION, PAROXYSMAL   COPD with acute exacerbation (HCC)   Thrombocytopenia (HCC)   Physical deconditioning   Pulmonary embolus (HCC)   LOS: 8 days  Author:  Berle Mull, MD [facility] 10/13/2018  If 7PM-7AM, please contact night-coverage To reach On-call, see [email or web address]

## 2018-10-13 NOTE — Progress Notes (Signed)
CRITICAL VALUE ALERT  Critical Value:  INR 4.5  Date & Time Notied: 10/13/2018 0408  Provider Notified: Dr Myna Hidalgo  Orders Received/Actions taken: Awaiting response

## 2018-10-14 LAB — CBC
HCT: 23 % — ABNORMAL LOW (ref 39.0–52.0)
Hemoglobin: 7.3 g/dL — ABNORMAL LOW (ref 13.0–17.0)
MCH: 31.9 pg (ref 26.0–34.0)
MCHC: 31.7 g/dL (ref 30.0–36.0)
MCV: 100.4 fL — AB (ref 80.0–100.0)
NRBC: 1.8 % — AB (ref 0.0–0.2)
PLATELETS: 175 10*3/uL (ref 150–400)
RBC: 2.29 MIL/uL — ABNORMAL LOW (ref 4.22–5.81)
RDW: 21.4 % — ABNORMAL HIGH (ref 11.5–15.5)
WBC: 13.4 10*3/uL — ABNORMAL HIGH (ref 4.0–10.5)

## 2018-10-14 LAB — PROTIME-INR
INR: 2.34
PROTHROMBIN TIME: 25.3 s — AB (ref 11.4–15.2)

## 2018-10-14 MED ORDER — SALINE SPRAY 0.65 % NA SOLN
1.0000 | NASAL | Status: DC | PRN
  Administered 2018-10-14: 1 via NASAL
  Filled 2018-10-14: qty 44

## 2018-10-14 NOTE — Progress Notes (Signed)
PROGRESS NOTE  [name] [name] BSJ:[id number] DOB: 1940/10/14 DOA: 10/04/2018 PCP: [name], MD  Brief History   78 year old man former smoker PMH COPD with frequent exacerbations, oxygen and prednisone dependent, presented with shortness of breath, of note recently hospitalized at [facility] for pneumonia and COPD exacerbation and discharged on doxycycline, liquid morphine for breathing and steroid who presented with shortness of breath and was admitted for acute on chronic hypoxic respiratory failure secondary to COPD exacerbation.  A & P  Acute bilateral pulmonary embolism with acute on chronic hypoxic respiratory failure.  Baseline requirement 2-3 L.  No evidence of RV strain by CT or echocardiogram.  Most likely precipitated by near bedbound status. --Overall remains labile although INR did increase.  Continue warfarin per pharmacy.   -Family regarding choice of anticoagulation.  Patient was on Eliquis for A. fib, in April 2019 PCP discontinued this medicine with concern for drop in hemoglobin, patient did not have any melena or any GI bleed reported.  There was no epistaxis.  Per family there was some concern about internal bleeding. Globin dropped in the hospital from 13-7.3. No external bleeding noted. With concern for internal bleeding I recommend to stop anticoagulation. Family agrees with this recommendation as the goal remains comfort as priority.  COPD exacerbation, chronic steroid dependency, chronic hypoxic respiratory failure --Condition waxes and wanes with periods of significant increased work of breathing and difficulty handling secretions followed by relatively calm periods.   --Continue supplemental oxygen, bronchodilators, steroids.  No escalation of care.  Thrombocytopenia --Likely secondary to acute illness and PE.  Trending up.  No further evaluation suggested.  Sepsis considered on admission with elevated lactic acid and recent treatment for pneumonia however CT  showed improving interstitial densities in the right lung  --Sepsis ruled out but could not rule out residual pneumonia.  Completed short course of antibiotics.    Paroxysmal atrial fibrillation.  Eliquis previously discontinued secondary to epistaxis. --Continue diltiazem  History of anginal chest pain as recorded by Dr. Domenic Polite 07/2018.  Reassuring Myoview in August. Continues to report severe chest pain, EKG nonacute.  Continue nitroglycerin as needed. Provement with scheduled OxyContin. Continue to monitor.  Generalized debility Home with hospice.  Goals of care discussion. I extensively discussed with family and patient regarding their goal of care. Current understanding is that we would like to be discharged with hospice. Patient has uncontrolled symptoms requiring multiple rounds of IV morphine for last 48 hours which would justify for residential hospice placement for symptom control as the burden of shortness of breath and pain is very high.  Significant response to oral OxyContin. Recommend to continue that for now and monitor response overnight and likely transitioning to home hospice with hospice at facility depending on symptomatology tomorrow.  I also explained in detail regarding hospice and hospice philosophy as they were requesting to continue anticoagulation on discharge. It appears that the understood my explanation. For now the goal is to continue comfort care here in the hospital, continue to focus on symptom control. If the symptoms are not controlled control in the next 24 hours consider transitioning to residential hospice facility for symptom control   DVT prophylaxis: Warfarin  Code Status: DNR/full comfort care Family Communication:  Disposition Plan: Recommend residential hospice  Consultants  .   Procedures  . Echo Study Conclusions  - Procedure narrative: Transthoracic echocardiography. Image   quality was adequate. The study was technically  difficult. - Left ventricle: The cavity size was normal. Wall thickness  was   increased in a pattern of mild LVH. Systolic function was   vigorous. The estimated ejection fraction was in the range of 65%   to 70%. Wall motion was normal; there were no regional wall   motion abnormalities. Doppler parameters are consistent with   abnormal left ventricular relaxation (grade 1 diastolic   dysfunction). The E/e&' ratio is between 8-15, suggesting elevated   LV filling pressure. - Mitral valve: Mildly thickened leaflets . There was trivial   regurgitation. - Left atrium: The atrium was normal in size. - Tricuspid valve: There was trivial regurgitation. - Pulmonary arteries: PA peak pressure: 36 mm Hg (S). - Inferior vena cava: The vessel was normal in size. The   respirophasic diameter changes were in the normal range (>= 50%),   consistent with normal central venous pressure.  Impressions:  - Technically difficult study. Hyperdynamic LV. Probably no   signifcant change compared to prior study in 11/2017.  Antibiotics  .   Interval History/Subjective  Pain is better.  No nausea no vomiting no fever no chills.  Reports continues to have severe shortness of breath.  Objective   Vitals:  Vitals:   10/14/18 1757 10/14/18 1823  BP: (!) 142/103 (!) 120/40  Pulse: 99   Resp:    Temp:    SpO2: 98%     Exam: Constitutional:   . Appears calm and comfortable Respiratory:  . CTA bilaterally, no w/r/r.  . Respiratory effort normal.  Cardiovascular:  . RRR, no m/r/g . No LE extremity edema   Psychiatric:  . Mental status o Mood, affect appropriate  I have personally reviewed the following:   Today's Data  . INR 3.63  Lab Data  . noted  Micro Data  . noted  Imaging  . CXR and CTA chest noted  Cardiology Data  . Sinus tachycardia, nonspecific ST changes  Other Data  .   Scheduled Meds: . diltiazem  360 mg Oral Daily  . feeding supplement (ENSURE ENLIVE)  237 mL  Oral BID BM  . guaiFENesin  600 mg Oral BID  . ipratropium  0.5 mg Nebulization TID  . levalbuterol  0.63 mg Nebulization TID  . mometasone-formoterol  2 puff Inhalation BID  . oxyCODONE  10 mg Oral Q12H  . pantoprazole  40 mg Oral Daily  . polyethylene glycol  17 g Oral BID  . predniSONE  20 mg Oral Q breakfast  . pregabalin  150 mg Oral Q8H  . senna  1 tablet Oral QHS  . sodium chloride flush  10-40 mL Intracatheter Q12H  . zolpidem  5 mg Oral QHS   Continuous Infusions:   Principal Problem:   Acute on chronic respiratory failure with hypoxia (HCC) Active Problems:   ATRIAL FIBRILLATION, PAROXYSMAL   COPD with acute exacerbation (HCC)   Thrombocytopenia (HCC)   Physical deconditioning   Pulmonary embolus ([place])   LOS: 9 days      Author:  Berle Mull, MD [facility] 10/14/2018  If 7PM-7AM, please contact night-coverage To reach On-call, see [email or web address]

## 2018-10-14 NOTE — Telephone Encounter (Signed)
Per hospital notes, patient requested coumadin and is being followed now as he is still in hospital

## 2018-10-15 MED ORDER — MORPHINE SULFATE (CONCENTRATE) 10 MG/0.5ML PO SOLN: 5.0000 mg | ORAL | 0 refills | Status: AC | PRN

## 2018-10-15 MED ORDER — LORAZEPAM 2 MG/ML PO CONC: 0.5000 mg | Freq: Four times a day (QID) | ORAL | 0 refills | Status: AC | PRN

## 2018-10-15 MED ORDER — ENSURE ENLIVE PO LIQD: 237.0000 mL | Freq: Two times a day (BID) | ORAL | 12 refills | Status: AC

## 2018-10-15 MED ORDER — LEVALBUTEROL HCL 0.63 MG/3ML IN NEBU: 0.6300 mg | INHALATION_SOLUTION | Freq: Three times a day (TID) | RESPIRATORY_TRACT | 0 refills | Status: AC

## 2018-10-15 MED ORDER — ZOLPIDEM TARTRATE 5 MG PO TABS: 5.0000 mg | ORAL_TABLET | Freq: Every day | ORAL | 0 refills | Status: AC

## 2018-10-15 MED ORDER — IPRATROPIUM BROMIDE 0.02 % IN SOLN: 0.5000 mg | Freq: Three times a day (TID) | RESPIRATORY_TRACT | 0 refills | Status: AC

## 2018-10-15 MED ORDER — LORAZEPAM 2 MG/ML PO CONC: 0.5000 mg | Freq: Four times a day (QID) | ORAL | Status: DC | PRN

## 2018-10-15 MED ORDER — PREDNISONE 20 MG PO TABS: 20.0000 mg | ORAL_TABLET | Freq: Every day | ORAL | 0 refills | Status: AC

## 2018-10-15 MED ORDER — OXYCODONE HCL ER 10 MG PO T12A: 10.0000 mg | EXTENDED_RELEASE_TABLET | Freq: Two times a day (BID) | ORAL | 0 refills | Status: AC

## 2018-10-15 MED ORDER — POLYETHYLENE GLYCOL 3350 17 G PO PACK: 17.0000 g | PACK | Freq: Two times a day (BID) | ORAL | 0 refills | Status: AC

## 2018-10-15 MED ORDER — DILTIAZEM HCL ER 120 MG PO CP24: 120.0000 mg | ORAL_CAPSULE | Freq: Every day | ORAL | 0 refills | Status: AC

## 2018-10-15 MED ORDER — SALINE SPRAY 0.65 % NA SOLN: 1.0000 | NASAL | 0 refills | Status: AC | PRN

## 2018-10-15 NOTE — Discharge Summary (Signed)
[facility] Discharge Summary   Patient: [name] [name] ZLD:[id number]   PCP: [name], MD DOB: [birth year]   Date of admission: 10/04/2018   Date of discharge:  10/15/2018    Discharge Diagnoses:   Principal Problem:   Acute on chronic respiratory failure with hypoxia ([place]) Active Problems:   ATRIAL FIBRILLATION, PAROXYSMAL   COPD with acute exacerbation ([place])   Thrombocytopenia ([place])   Physical deconditioning   Pulmonary embolus ([place])   Admitted From: home Disposition: Home with hospice  Recommendations for Outpatient Follow-up:  1. Please establish care with [facility]  Follow-up Information    [name], MD Follow up.   Specialty:  Internal Medicine Contact information: [facility] [address] [place], [place] Follow up.   Why:  home with hospice Contact information: [address] [address] [phone]          Diet recommendation: Regular diet, comfort feeds  Activity: The patient is advised to gradually reintroduce usual activities.  Discharge Condition: stable  Code Status: DNR/DNI, comfort care and hospice  History of present illness: As per the H and P dictated on admission, " [name] [name] is a 78 y.o. male with medical history significant of former smoker (80 pack-year history), COPD with frequent exacerbations (oxygen and prednisone dependent), hyperlipidemia, paroxysmal atrial fibrillation presenting to the hospital for evaluation of shortness of breath.  Patient reports having chronic shortness of breath due to his COPD and uses 2 to 3 L home oxygen at all times.  States his dyspnea has been worse for the past 2 days and he has been having generalized weakness.  Denies having any cough or fevers.  Also reports having intermittent pain across his chest, even at rest for several months which occurs when he is really short of breath.  Family at bedside states  patient has had multiple hospitalizations due to his COPD.  States he was recently admitted to [facility] for pneumonia and COPD exacerbation.  He was discharged 5 days ago with doxycycline, steroid, and a prescription for liquid morphine to help with his breathing.  Family states the doctor at [facility] had recommended palliative care but patient had declined.  States patient stopped taking morphine as it was making him too lethargic.  Family states patient is now so weak that he is not even able to get out of bed, as such, he has not been able to [facility] Course:  Summary of his active problems in the hospital is as following. Acute bilateral pulmonary embolism with acute on chronic hypoxic respiratory failure.  acute anemia of undetermined etiology.  Blood loss cannot be ruled out.  Baseline requirement 2-3 L.  No evidence of RV strain by CT or echocardiogram.  Most likely precipitated by near bedbound status. --Overall remains labile although INR did increase.  Continue warfarin per pharmacy.   -Family regarding choice of anticoagulation.  Patient was on Eliquis for A. fib, in April 2019 PCP discontinued this medicine with concern for drop in hemoglobin, patient did not have any melena or any GI bleed reported.  There was no epistaxis.  Per family there was some concern about internal bleeding. Globin dropped in the hospital from 13-7.3. No external bleeding noted. With concern for internal bleeding I recommend to stop anticoagulation. Family agrees with this recommendation as the goal remains comfort as priority.  COPD exacerbation, chronic steroid dependency, chronic hypoxic respiratory failure --Condition waxes and wanes with periods of significant increased work of breathing and difficulty handling secretions followed by relatively calm periods.   --Continue supplemental oxygen, bronchodilators, steroids.  No escalation of  care.  Thrombocytopenia --Likely secondary to acute illness and PE.  Trending up.  No further evaluation suggested.  Sepsis considered on admission with elevated lactic acid and recent treatment for pneumonia however CT showed improving interstitial densities in the right lung  --Sepsis ruled out but could not rule out residual pneumonia.  Completed short course of antibiotics.    Paroxysmal atrial fibrillation.  Eliquis previously discontinued secondary to epistaxis. --Continue diltiazem 1 dose adjusted.  History of anginal chest pain as recorded by Dr. Domenic Polite 07/2018.  Reassuring Myoview in August. Continues to report severe chest pain, EKG nonacute.  Continue nitroglycerin as needed. Significant improvement with scheduled OxyContin.  Generalized debility Home with hospice.  Goals of care discussion. I extensively discussed with family and patient regarding their goal of care. Current understanding is that we would like to be discharged with hospice. The pain was uncontrolled as well as breathing was very heavy.  Patient had significant response to oral OxyContin. Recommend to continue that for now, transitioning to home hospice with hospice at facility pending on progress at home.  I also explained in detail regarding hospice and hospice philosophy as they were requesting to continue anticoagulation on discharge. It appears that the understood my explanation. For now the goal is to continue comfort care here in the hospital, continue to focus on symptom control. If the symptoms are not controlled control in the next 24 hours consider transitioning to residential hospice facility for symptom control  Body mass index is 18.65 kg/m.  Nutrition Problem: Increased nutrient needs Etiology: acute illness, chronic illness Nutrition Interventions: Interventions: Ensure Enlive (each supplement provides 350kcal and 20 grams of protein)  Pain control on hospice - [facility]  Controlled Substance Reporting System database was reviewed. -Prescription for Roxanol, scheduled OxyContin twice daily as well as Ativan was provided for hospice care. - Patient was instructed, not to drive, operate heavy machinery, perform activities at heights, swimming or participation in water activities or provide baby sitting services while on Pain, Sleep and Anxiety Medications; until his outpatient Physician has advised to do so again.  - Also recommended to not to take more than prescribed Pain, Sleep and Anxiety Medications.  On the day of the discharge the patient's vitals were stable , and no other acute medical condition were reported by patient. the patient was felt safe to be discharge at home with hospice.  Consultants: none Procedures: Echocardiogram   DISCHARGE MEDICATION: Allergies as of 10/15/2018      Reactions   Albuterol Other (See Comments)   Continuous nebulization caused extreme tachycardia. May use the inhaler.      Medication List    STOP taking these medications   acetylcysteine 10 % nebulizer solution Commonly known as:  MUCOMYST   aspirin EC 81 MG tablet   doxycycline 100 MG tablet Commonly known as:  ADOXA   HYDROcodone-acetaminophen 5-325 MG tablet Commonly known as:  NORCO/VICODIN   ipratropium-albuterol 0.5-2.5 (3) MG/3ML Soln Commonly known as:  DUONEB   levofloxacin 500 MG tablet Commonly known as:  LEVAQUIN   LORazepam 0.5 MG tablet Commonly known as:  ATIVAN Replaced by:  LORazepam 2 MG/ML concentrated solution   morphine 10 MG/5ML solution Replaced by:  morphine CONCENTRATE 10 MG/0.5ML Soln concentrated solution  Oxycodone HCl 10 MG Tabs Replaced by:  oxyCODONE 10 mg 12 hr tablet     TAKE these medications   diltiazem 120 MG 24 hr capsule Commonly known as:  DILT-XR Take 1 capsule (120 mg total) by mouth daily. What changed:    how much to take  how to take this  when to take this  additional instructions    docusate sodium 100 MG capsule Commonly known as:  COLACE Take 100 mg by mouth daily.   feeding supplement (ENSURE ENLIVE) Liqd Take 237 mLs by mouth 2 (two) times daily between meals.   guaiFENesin 600 MG 12 hr tablet Commonly known as:  MUCINEX Take 1 tablet (600 mg total) by mouth 2 (two) times daily.   ipratropium 0.02 % nebulizer solution Commonly known as:  ATROVENT Take 2.5 mLs (0.5 mg total) by nebulization 3 (three) times daily.   levalbuterol 0.63 MG/3ML nebulizer solution Commonly known as:  XOPENEX Take 3 mLs (0.63 mg total) by nebulization 3 (three) times daily.   LORazepam 2 MG/ML concentrated solution Commonly known as:  ATIVAN Take 0.3 mLs (0.6 mg total) by mouth every 6 (six) hours as needed for anxiety. Replaces:  LORazepam 0.5 MG tablet   LYRICA 150 MG capsule Generic drug:  pregabalin TAKE ONE CAPSULE BY MOUTH THREE TIMES DAILY What changed:    how much to take  when to take this   mometasone-formoterol 200-5 MCG/ACT Aero Commonly known as:  DULERA Inhale 2 puffs into the lungs 2 (two) times daily.   morphine CONCENTRATE 10 MG/0.5ML Soln concentrated solution Take 0.25-0.5 mLs (5-10 mg total) by mouth every 4 (four) hours as needed (5mg  for moderate pain, 10mg  for severe pain). Replaces:  morphine 10 MG/5ML solution   nitroGLYCERIN 0.4 MG SL tablet Commonly known as:  NITROSTAT Place 1 tablet (0.4 mg total) under the tongue every 5 (five) minutes as needed for chest pain.   oxyCODONE 10 mg 12 hr tablet Commonly known as:  OXYCONTIN Take 1 tablet (10 mg total) by mouth every 12 (twelve) hours. Replaces:  Oxycodone HCl 10 MG Tabs   polyethylene glycol packet Commonly known as:  MIRALAX / GLYCOLAX Take 17 g by mouth 2 (two) times daily.   predniSONE 20 MG tablet Commonly known as:  DELTASONE Take 1 tablet (20 mg total) by mouth daily with breakfast. Start taking on:  October 16, 2018 What changed:    medication strength  how much to  take  how to take this  when to take this  additional instructions  Another medication with the same name was removed. Continue taking this medication, and follow the directions you see here.   PROAIR HFA 108 (90 Base) MCG/ACT inhaler Generic drug:  albuterol Inhale 1-2 puffs into the lungs every 6 (six) hours as needed for wheezing or shortness of breath.   sodium chloride 0.65 % Soln nasal spray Commonly known as:  OCEAN Place 1 spray into both nostrils as needed for congestion.   zolpidem 5 MG tablet Commonly known as:  AMBIEN Take 1 tablet (5 mg total) by mouth at bedtime.      Allergies  Allergen Reactions  . Albuterol Other (See Comments)    Continuous nebulization caused extreme tachycardia. May use the inhaler.   Discharge Instructions    Diet general   Complete by:  As directed    Increase activity slowly   Complete by:  As directed      Discharge Exam: Filed Weights  10/04/18 1739  Weight: 59 kg   Vitals:   10/15/18 0750 10/15/18 1346  BP: (!) 98/44   Pulse: 83 83  Resp:  18  Temp: 98 F (36.7 C)   SpO2: 96% 95%   General: Appear in mild distress, no Rash; Oral Mucosa moist. Cardiovascular: S1 and S2 Present, no Murmur, no JVD Respiratory: Bilateral Air entry present and bilateral  Crackles, no wheezes Abdomen: Bowel Sound present, Soft and no tenderness Extremities: no Pedal edema, no calf tenderness Neurology: Grossly no focal neuro deficit.  The results of significant diagnostics from this hospitalization (including imaging, microbiology, ancillary and laboratory) are listed below for reference.    Significant Diagnostic Studies: Ct Angio Chest Pe W Or Wo Contrast  Result Date: 10/05/2018 CLINICAL DATA:  78 year old male with chronic shortness of breath and intermittent chest pain. EXAM: CT ANGIOGRAPHY CHEST WITH CONTRAST TECHNIQUE: Multidetector CT imaging of the chest was performed using the standard protocol during bolus administration of  intravenous contrast. Multiplanar CT image reconstructions and MIPs were obtained to evaluate the vascular anatomy. CONTRAST:  121mL ISOVUE-370 IOPAMIDOL (ISOVUE-370) INJECTION 76% COMPARISON:  Chest radiograph dated 10/04/2018 and CT dated 09/13/2018 FINDINGS: Evaluation of this exam is limited due to respiratory motion artifact. Cardiovascular: There is no cardiomegaly. There is multi vessel coronary vascular calcification. Small loculated appearing pericardial fluid anterior to the heart measures 11 mm in greatest thickness. There is moderate atherosclerotic calcification of the thoracic aorta. Small right upper lobe segmental, left lower lobe subsegmental, left upper lobe segmental branch pulmonary artery emboli noted. There is nonocclusive segmental and subsegmental thrombus in the lingula. No definite CT evidence of right heart straining. Mediastinum/Nodes: Top-normal bilateral hilar lymph nodes measure 10 mm in the right hilum. Esophagus and the thyroid gland are grossly unremarkable. No mediastinal fluid collection. Lungs/Pleura: There is a background of emphysema. Right lung base subpleural and interstitial densities overall improved since the prior CT and may represent scarring although recurrent or residual infiltrate is not excluded. Clinical correlation is recommended. There is a 7 mm left lower lobe subpleural nodule similar to prior CT. An additional 6 mm right upper lobe subpleural nodule also similar to prior CT. A previously described subsolid nodule in the right upper lobe/right apical region appears to correlate with an area of bronchiectasis and likely represented impacted mucous within a dilated distal bronchial. Mucus secretions noted in the left and right mainstem bronchi. There is no pleural effusion or pneumothorax. Upper Abdomen: Scattered calcified splenic granuloma. Multiple stones within the gallbladder. Partially visualized 5.5 cm hypodense lesion in the upper pole of the left kidney,  incompletely characterized. This can be further evaluated with ultrasound on a nonemergent basis. Musculoskeletal: T6 compression fracture similar to prior CT. No acute fracture. Osteopenia. Review of the MIP images confirms the above findings. IMPRESSION: 1. Bilateral segmental and subsegmental pulmonary artery emboli. No definite CT evidence of right heart straining. 2. Emphysema. Right lung base subpleural and interstitial densities overall improved since the prior CT and may represent scarring although recurrent or residual infiltrate is not excluded. Clinical correlation is recommended. 3. Multiple bilateral pulmonary nodules. Follow-up as per recommendation of the prior CT. 4. Cholelithiasis. These results were called by telephone at the time of interpretation on 10/05/2018 at 1:52 am to nurse Truman Hayward, who verbally acknowledged these results. Electronically Signed   By: Anner Crete M.D.   On: 10/05/2018 02:03   Dg Chest Port 1 View  Result Date: 10/10/2018 CLINICAL DATA:  Shortness of breath. Ex-smoker.  Recently diagnosed pulmonary embolism. EXAM: PORTABLE CHEST 1 VIEW COMPARISON:  10/04/2018 and chest CTA dated 10/05/2018. FINDINGS: Normal sized heart. Interval minimal patchy and linear density at the right lung base. Otherwise, the lungs remain clear and hyperexpanded with mild peribronchial thickening. Diffuse osteopenia. Mild scoliosis. IMPRESSION: 1. Interval minimal patchy and linear atelectasis and possible pneumonia/pulmonary infarction at the right lung base. 2. Stable changes of COPD and chronic bronchitis. Electronically Signed   By: Claudie Revering M.D.   On: 10/10/2018 12:47   Dg Chest Port 1 View  Result Date: 10/04/2018 CLINICAL DATA:  78 year old male with deep productive cough and shortness breath for 24 hours. History of COPD. Recent hospitalization for right lower lobe pneumonia. Subsequent encounter. EXAM: PORTABLE CHEST 1 VIEW COMPARISON:  09/23/2018 chest x-ray.  09/13/2018 chest CT.  FINDINGS: No infiltrate, congestive heart failure or pneumothorax noted. Emphysematous changes. CT detected right upper lobe solid/sub solid nodule not appreciated on present plain film exam. Heart size within normal limits.  Calcified aorta. IMPRESSION: 1. No infiltrate, congestive heart failure or pneumothorax noted. 2. 09/13/2018 CT detected right upper lobe solid/sub solid nodule not appreciated on present plain film exam exam. Follow-up CT as previously recommended (3 months from 09/13/2018). 3.  Emphysema (ICD10-J43.9). Electronically Signed   By: Genia Del M.D.   On: 10/04/2018 18:31    Microbiology: No results found for this or any previous visit (from the past 240 hour(s)).   Labs: CBC: Recent Labs  Lab 10/09/18 0526 10/10/18 0230 10/14/18 0611  WBC 12.5* 11.2* 13.4*  HGB 9.6* 9.1* 7.3*  HCT 30.0* 28.2* 23.0*  MCV 95.2 94.9 100.4*  PLT 130* 133* 836   Basic Metabolic Panel: No results for input(s): NA, K, CL, CO2, GLUCOSE, BUN, CREATININE, CALCIUM, MG, PHOS in the last 168 hours. Liver Function Tests: No results for input(s): AST, ALT, ALKPHOS, BILITOT, PROT, ALBUMIN in the last 168 hours. No results for input(s): LIPASE, AMYLASE in the last 168 hours. No results for input(s): AMMONIA in the last 168 hours. Cardiac Enzymes: No results for input(s): CKTOTAL, CKMB, CKMBINDEX, TROPONINI in the last 168 hours. BNP (last 3 results) Recent Labs    11/18/17 1930 11/25/17 2031  BNP 40.0 59.0   CBG: No results for input(s): GLUCAP in the last 168 hours. Time spent: 35 minutes  Signed:  Berle Mull  [facility]  10/15/2018

## 2018-10-15 NOTE — Care Management Note (Addendum)
Case Management Note  Patient Details  Name: [name] [name] MRN: [id number] Date of Birth: 1940-10-29  Subjective/Objective:    From home with spouse, acute/chronic resp failure, afib, bil PE, sepsis, copd, not on any anticoagulation, awaiting benefit check for eliquis/xarelto.  Patient is active with [facility] [place], [place], but per pt/ot eval on this admit rec SNF, CSW aware and working on placement.     1/31 [name] RN, BSN - patient is for dc to home with [facility] today, NCM spoke with [name] she asked NCM to fax prescriptions to [facility] at [phone]. This NCM faxed scripts to the pharmacy.  [facility] will be delivering DME to patient's home today, the grandson [name] [name] will be there to accept the DME and will notify the wife here at the hospital when it is delivered, then [facility] can be set up to take patient home. [facility] phone is [phone]. Address has been confirmed by wife.  [facility] pick up scheduled for 2:30.                                          Action/Plan: DC home with hospice once DME has been confirmed at the home.   Expected Discharge Date:  10/15/18               Expected Discharge Plan:  Home w Hospice Care  In-House Referral:  Clinical Social Work  Discharge planning Services  CM Consult  Post Acute Care Choice:    Choice offered to:  Patient  DME Arranged:    DME Agency:     HH Arranged:  RN [place] Agency:  [facility]  Status of Service:  Completed, signed off  If discussed at [employer], dates discussed:    Additional Comments:  [name], RN 10/15/2018, 1:32 PM

## 2018-11-26 ENCOUNTER — Other Ambulatory Visit: Payer: Self-pay

## 2018-11-29 ENCOUNTER — Other Ambulatory Visit: Payer: Self-pay | Admitting: *Deleted

## 2018-11-29 MED ORDER — MOMETASONE FURO-FORMOTEROL FUM 200-5 MCG/ACT IN AERO: 2.0000 | INHALATION_SPRAY | Freq: Two times a day (BID) | RESPIRATORY_TRACT | 2 refills | Status: AC

## 2019-01-25 ENCOUNTER — Telehealth: Payer: Self-pay | Admitting: Cardiology

## 2019-01-25 NOTE — Telephone Encounter (Signed)

## 2019-01-26 NOTE — Progress Notes (Signed)
Virtual Visit via Telephone Note   This visit type was conducted due to national recommendations for restrictions regarding the COVID-19 Pandemic (e.g. social distancing) in an effort to limit this patient's exposure and mitigate transmission in our community.  Due to his co-morbid illnesses, this patient is at least at moderate risk for complications without adequate follow up.  This format is felt to be most appropriate for this patient at this time.  The patient did not have access to video technology/had technical difficulties with video requiring transitioning to audio format only (telephone).  All issues noted in this document were discussed and addressed.  No physical exam could be performed with this format.  Please refer to the patient's chart for his  consent to telehealth for [facility].   Date:  01/27/2019   ID:  [name] [name], DOB 11/21/1940, MRN [id number]  Patient Location: Home Provider Location: Home  PCP:  [name], MD  Cardiologist:  Rozann Lesches, MD  Evaluation Performed:  Follow-Up Visit  Chief Complaint:   Cardiac follow-up  History of Present Illness:    [name] [name] is a 78 y.o. male last seen in November 2019.  He did not have video access and we spoke by phone today.  He is at home now with hospice nursing.  He has end-stage COPD and also diagnosis of pulmonary embolus back in January as detailed below.  Records indicate hospitalization in January with COPD exacerbation, acute on chronic hypoxic respiratory failure, also bilateral pulmonary emboli.  He was initially on Coumadin however this was discontinued by the primary team with plan for hospice care, records reviewed.  He has a history of paroxysmal atrial fibrillation with CHADSVASC score of 3.  He does not report any palpitations and continues on diltiazem CD for heart rate control.  Echocardiogram from January revealed LVEF 65 to 70% with grade 1 diastolic dysfunction, PASP 36 mmHg.  The  patient does not have symptoms concerning for COVID-19 infection (fever, chills, cough, or new shortness of breath).    Past Medical History:  Diagnosis Date  . AAA (abdominal aortic aneurysm) ([place])   . Asthma   . COPD with emphysema ([place])    Dr. Joya Gaskins, Gold stage C  . DJD (degenerative joint disease), lumbar   . Emphysema of lung ([place])   . History of prostate cancer    Status post radioactive seed implants  . History of shingles   . Incomplete emptying of bladder   . Mixed hyperlipidemia   . PAF (paroxysmal atrial fibrillation) ([place])   . Palpitations   . Peripheral neuropathy   . Prostate cancer ([place])   . Sciatic nerve pain    Secondary to shingles 2011  . Urethral stricture   . Wears partial dentures    Upper and lower   Past Surgical History:  Procedure Laterality Date  . CATARACT EXTRACTION W/ INTRAOCULAR LENS  IMPLANT, BILATERAL  2012  . COLONOSCOPY  02-14-2003  . CYSTO/  BALLOON DILATION AND INCISION BLADDER NECK CONTRACTURE  X5  LAST ONE 01-24-2009  . CYSTOSCOPY WITH URETHRAL DILATATION  09/15/2011   Procedure: CYSTOSCOPY WITH URETHRAL DILATATION;  Surgeon: Bernestine Amass, MD;  Location: [facility];  Service: Urology;  Laterality: N/A;  CYSTOSCOPY, BALLOON DILATION   . CYSTOSCOPY WITH URETHRAL DILATATION N/A 04/03/2014   Procedure: CYSTOSCOPY WITH dilatation;  Surgeon: Bernestine Amass, MD;  Location: [facility];  Service: Urology;  Laterality: N/A;  . EYE SURGERY    .  MASS EXCISION N/A 06/16/2018   Procedure: EXCISION PERIANAL CYST;  Surgeon: Aviva Signs, MD;  Location: [facility];  Service: General;  Laterality: N/A;  . RADIOACTIVE PROSTATE SEED IMPLANTS/  CYSTO WITH BALLOON DILATION BLADDER NECK CONTRACTURE  11-26-2006     Current Meds  Medication Sig  . diltiazem (DILT-XR) 120 MG 24 hr capsule Take 1 capsule (120 mg total) by mouth daily.  Marland Kitchen docusate sodium (COLACE) 100 MG capsule Take 100 mg by mouth daily.  . feeding supplement,  ENSURE ENLIVE, (ENSURE ENLIVE) LIQD Take 237 mLs by mouth 2 (two) times daily between meals.  Marland Kitchen guaiFENesin (MUCINEX) 600 MG 12 hr tablet Take 1 tablet (600 mg total) by mouth 2 (two) times daily.  Marland Kitchen ipratropium (ATROVENT) 0.02 % nebulizer solution Take 2.5 mLs (0.5 mg total) by nebulization 3 (three) times daily.  Marland Kitchen levalbuterol (XOPENEX) 0.63 MG/3ML nebulizer solution Take 3 mLs (0.63 mg total) by nebulization 3 (three) times daily.  Marland Kitchen LORazepam (ATIVAN) 2 MG/ML concentrated solution Take 0.3 mLs (0.6 mg total) by mouth every 6 (six) hours as needed for anxiety.  Marland Kitchen LYRICA 150 MG capsule TAKE ONE CAPSULE BY MOUTH THREE TIMES DAILY (Patient taking differently: Take 150 mg by mouth every 8 (eight) hours. )  . mometasone-formoterol (DULERA) 200-5 MCG/ACT AERO Inhale 2 puffs into the lungs 2 (two) times daily.  . Morphine Sulfate (MORPHINE CONCENTRATE) 10 MG/0.5ML SOLN concentrated solution Take 0.25-0.5 mLs (5-10 mg total) by mouth every 4 (four) hours as needed (5mg  for moderate pain, 10mg  for severe pain).  . nitroGLYCERIN (NITROSTAT) 0.4 MG SL tablet Place 1 tablet (0.4 mg total) under the tongue every 5 (five) minutes as needed for chest pain.  Marland Kitchen oxyCODONE (OXYCONTIN) 10 mg 12 hr tablet Take 1 tablet (10 mg total) by mouth every 12 (twelve) hours.  . polyethylene glycol (MIRALAX / GLYCOLAX) packet Take 17 g by mouth 2 (two) times daily.  . predniSONE (DELTASONE) 20 MG tablet Take 1 tablet (20 mg total) by mouth daily with breakfast.  . zolpidem (AMBIEN) 5 MG tablet Take 1 tablet (5 mg total) by mouth at bedtime.     Allergies:   Albuterol   Social History   Tobacco Use  . Smoking status: Former Smoker    Packs/day: 2.00    Years: 40.00    Pack years: 80.00    Types: Cigarettes    Start date: 06/19/1954    Last attempt to quit: 09/15/1998    Years since quitting: 20.3  . Smokeless tobacco: Never Used  . Tobacco comment: Counseled to remain smoke free  Substance Use Topics  . Alcohol use:  No    Alcohol/week: 0.0 standard drinks  . Drug use: No     Family Hx: The patient's family history includes COPD in his mother; Heart disease in his brother.  ROS:   Please see the history of present illness.    All other systems reviewed and are negative.   Prior CV studies:   The following studies were reviewed today:  Lexiscan Myoview 05/12/2018:  No diagnostic ST segment changes to indicate ischemia.  Medium sized, mild intensity, fixed inferior defect that is more prominent at rest and consistent with soft tissue attenuation in light of normal wall motion. No definite ischemia.  This is a low risk study.  Nuclear stress EF: 80%.  Echocardiogram 10/05/2018: Study Conclusions  - Procedure narrative: Transthoracic echocardiography. Image   quality was adequate. The study was technically difficult. - Left ventricle: The cavity  size was normal. Wall thickness was   increased in a pattern of mild LVH. Systolic function was   vigorous. The estimated ejection fraction was in the range of 65%   to 70%. Wall motion was normal; there were no regional wall   motion abnormalities. Doppler parameters are consistent with   abnormal left ventricular relaxation (grade 1 diastolic   dysfunction). The E/e&' ratio is between 8-15, suggesting elevated   LV filling pressure. - Mitral valve: Mildly thickened leaflets . There was trivial   regurgitation. - Left atrium: The atrium was normal in size. - Tricuspid valve: There was trivial regurgitation. - Pulmonary arteries: PA peak pressure: 36 mm Hg (S). - Inferior vena cava: The vessel was normal in size. The   respirophasic diameter changes were in the normal range (>= 50%),   consistent with normal central venous pressure.  Impressions:  - Technically difficult study. Hyperdynamic LV. Probably no   signifcant change compared to prior study in 11/2017.  Labs/Other Tests and Data Reviewed:    EKG:  An ECG dated 10/10/2018 was  personally reviewed today and demonstrated:  Sinus tachycardia.  Recent Labs: 08/11/2018: ALT 16 08/14/2018: Magnesium 2.3 10/06/2018: BUN 30; Creatinine, Ser 0.63; Potassium 3.5; Sodium 142 10/14/2018: Hemoglobin 7.3; Platelets 175   Recent Lipid Panel Lab Results  Component Value Date/Time   CHOL 154 10/27/2017 02:35 PM   TRIG 64 10/27/2017 02:35 PM   HDL 62 10/27/2017 02:35 PM   CHOLHDL 2.5 10/27/2017 02:35 PM   LDLCALC 78 10/27/2017 02:35 PM    Wt Readings from Last 3 Encounters:  10/04/18 130 lb (59 kg)  08/24/18 155 lb 3.2 oz (70.4 kg)  08/11/18 167 lb (75.8 kg)     Objective:    Vital Signs:  There were no vitals taken for this visit.   I did not have access to recent vital signs per hospice nurse today. Patient spoke in full sentences on the phone, not obviously short of breath at rest. He tells me that he is largely bedbound. Voice tone and speech pattern normal.  ASSESSMENT & PLAN:    1.  Paroxysmal to persistent atrial fibrillation.  Continue diltiazem CD.  He is not anticoagulated previous discussion and in the setting of recurrent bleeding.  2.  End-stage COPD with chronic hypoxic respiratory failure and also pulmonary embolus as of January.  He is now on hospice care.  COVID-19 Education: The signs and symptoms of COVID-19 were discussed with the patient and how to seek care for testing (follow up with PCP or arrange E-visit).  The importance of social distancing was discussed today.  Time:   Today, I have spent 6 minutes with the patient with telehealth technology discussing the above problems.     Medication Adjustments/Labs and Tests Ordered: Current medicines are reviewed at length with the patient today.  Concerns regarding medicines are outlined above.   Tests Ordered: No orders of the defined types were placed in this encounter.   Medication Changes: No orders of the defined types were placed in this encounter.    Disposition:  Follow up 6  months.  Signed, Rozann Lesches, MD  01/27/2019 11:32 AM    [place]

## 2019-01-27 ENCOUNTER — Telehealth (INDEPENDENT_AMBULATORY_CARE_PROVIDER_SITE_OTHER): Payer: Medicare Other | Admitting: Cardiology

## 2019-01-27 DIAGNOSIS — J9611 Chronic respiratory failure with hypoxia: Secondary | ICD-10-CM | POA: Diagnosis not present

## 2019-01-27 DIAGNOSIS — I480 Paroxysmal atrial fibrillation: Secondary | ICD-10-CM | POA: Diagnosis not present

## 2019-01-27 DIAGNOSIS — I2699 Other pulmonary embolism without acute cor pulmonale: Secondary | ICD-10-CM | POA: Diagnosis not present

## 2019-01-27 DIAGNOSIS — Z7189 Other specified counseling: Secondary | ICD-10-CM

## 2019-01-27 DIAGNOSIS — J449 Chronic obstructive pulmonary disease, unspecified: Secondary | ICD-10-CM | POA: Diagnosis not present

## 2019-01-27 NOTE — Patient Instructions (Signed)
Medication Instructions: Your physician recommends that you continue on your current medications as directed. Please refer to the Current Medication list given to you today.   Labwork: None today  Procedures/Testing: None today  Follow-Up: 6 months with Dr.McDowell  Any Additional Special Instructions Will Be Listed Below (If Applicable).     If you need a refill on your cardiac medications before your next appointment, please call your pharmacy.   

## 2019-01-31 ENCOUNTER — Telehealth: Payer: Self-pay | Admitting: Adult Health

## 2019-01-31 NOTE — Telephone Encounter (Signed)
After looking at the Chart Tammy P is aware of this already

## 2019-03-07 ENCOUNTER — Ambulatory Visit (HOSPITAL_COMMUNITY): Payer: Medicare Other

## 2019-03-22 IMAGING — CT CT CHEST W/O CM
2 of 3 series · 15 of 36 positions shown, 18 images · non-contrast
Comparison: Chest CT 12/08/2016.

CLINICAL DATA: 75-year-old male with history of pulmonary nodule.
Followup study. Increasing shortness of breath. History prostate
cancer. Former smoker (quit in [id number]).

EXAM:
CT CHEST WITHOUT CONTRAST
TECHNIQUE: Multidetector CT imaging of the chest was performed following the
standard protocol without IV contrast.

[Series 2: thorax · axial · 0.71mm/px · z∈[-283,-23]mm · 12 of 154 slices shown, 15 images]
[im 12/154  mediastinal]
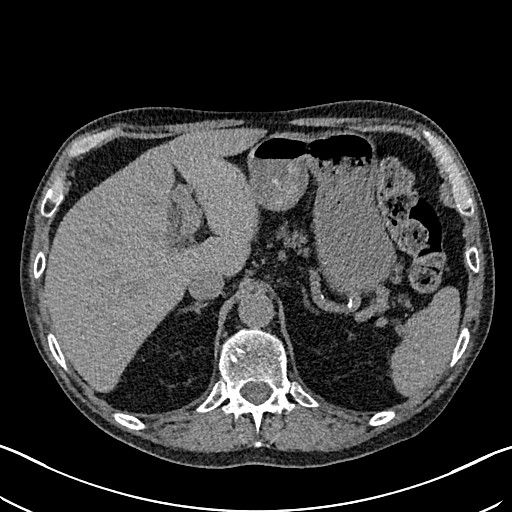
[im 12/154  lung]
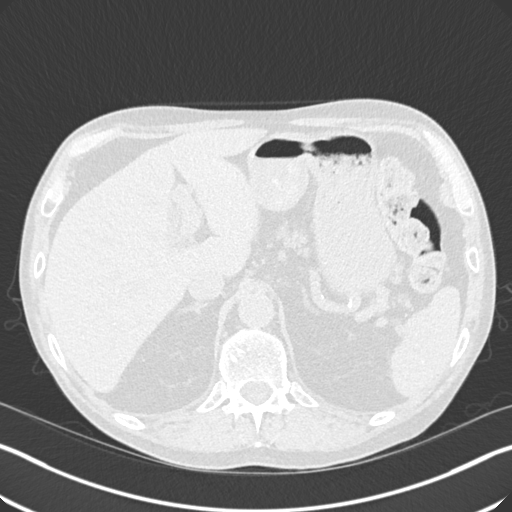
[im 23/154  lung]
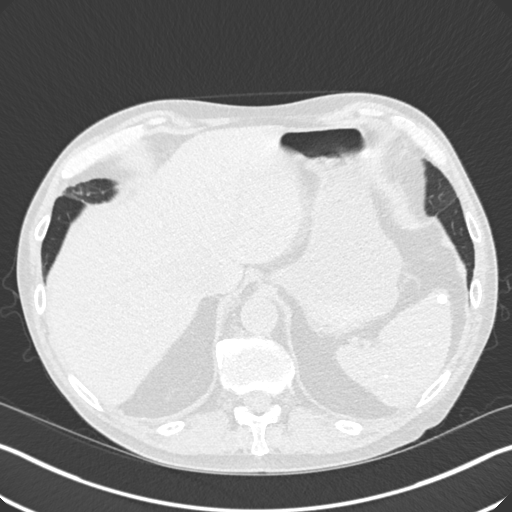
[im 35/154  lung]
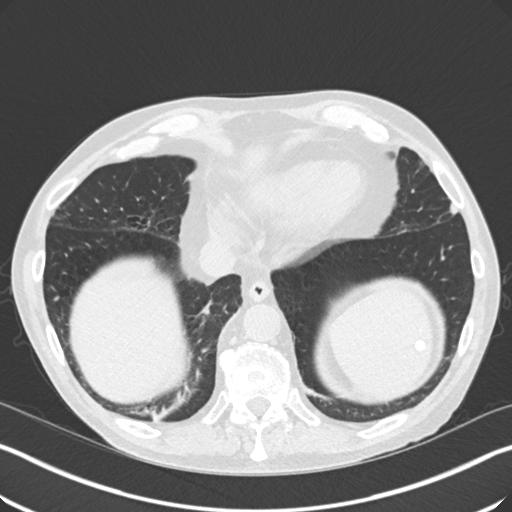
[im 46/154  lung]
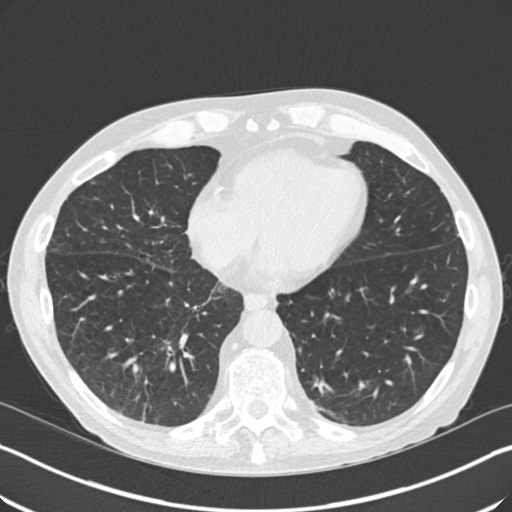
[im 57/154  mediastinal]
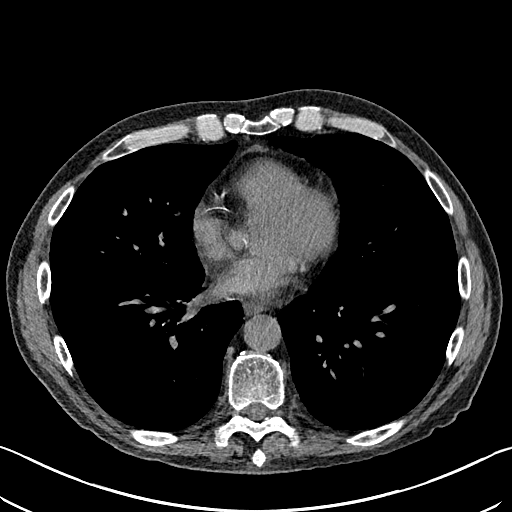
[im 57/154  lung]
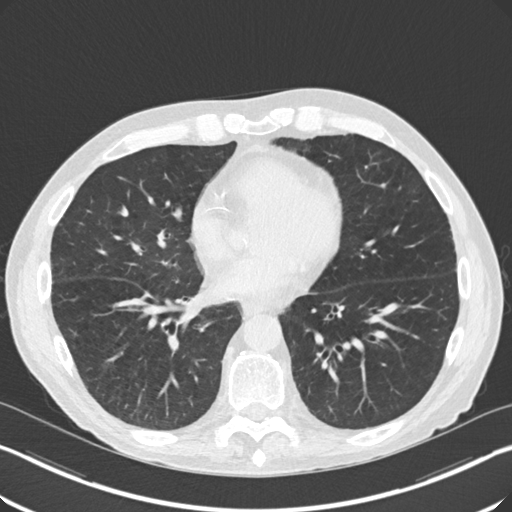
[im 69/154  lung]
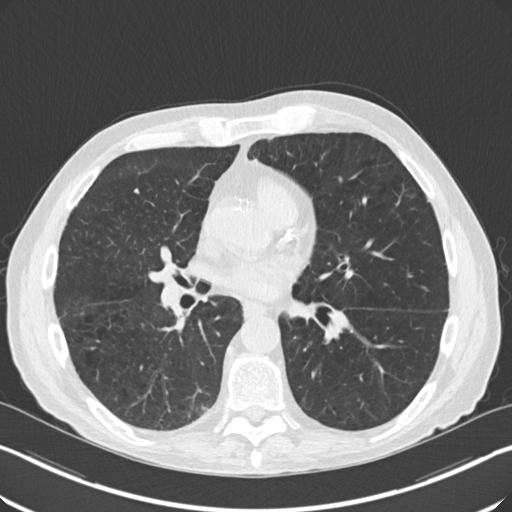
[im 86/154  lung]
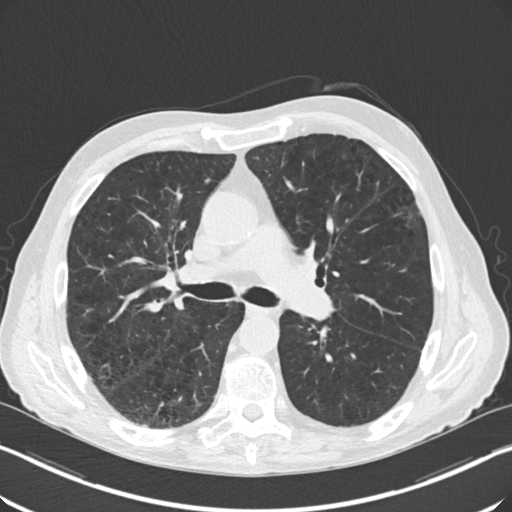
[im 97/154  lung]
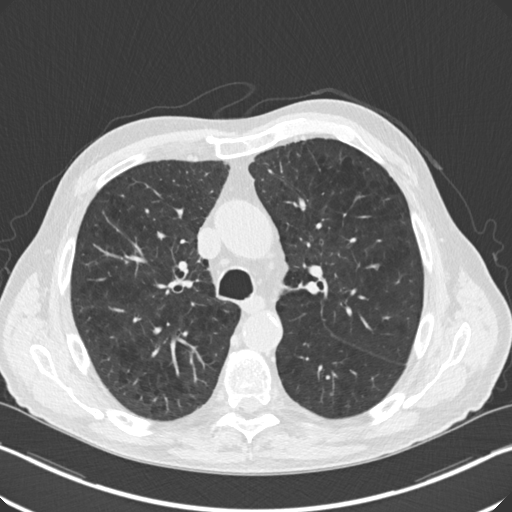
[im 108/154  mediastinal]
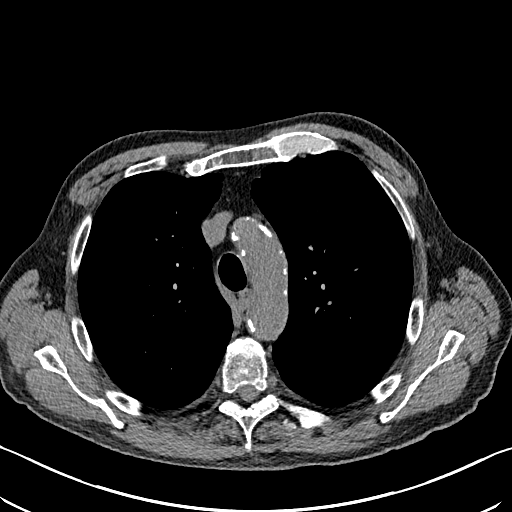
[im 108/154  lung]
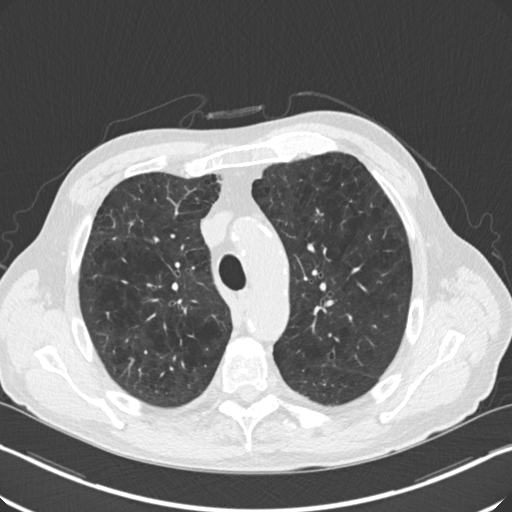
[im 120/154  lung]
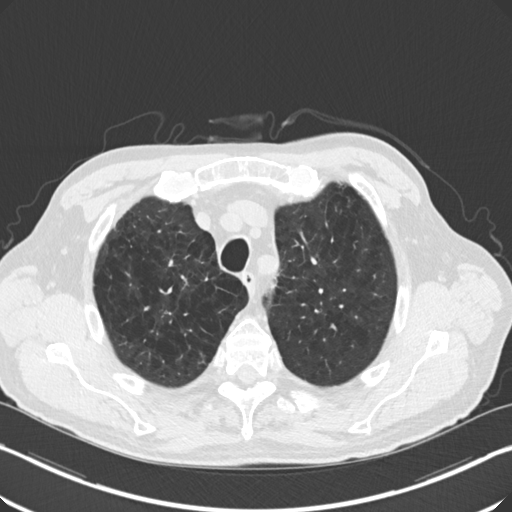
[im 131/154  lung]
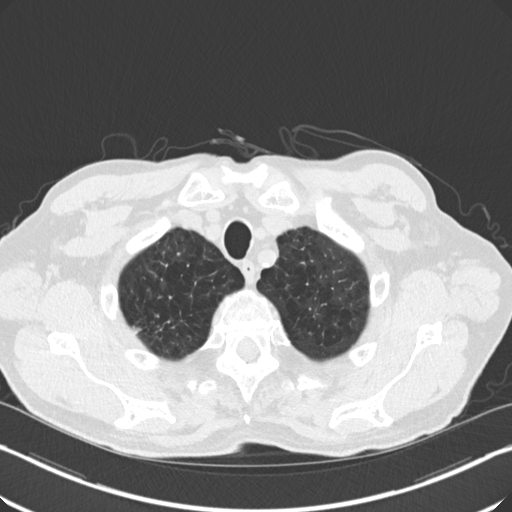
[im 142/154  lung]
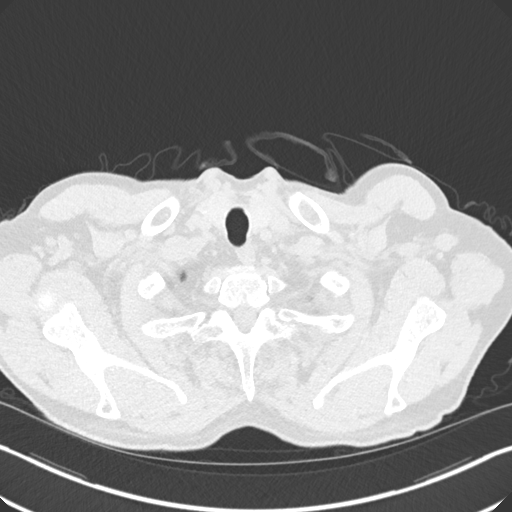

[Series 5: coronal · coronal · 0.64mm/px · 3 of 144 slices shown]
[im 29/144  lung]
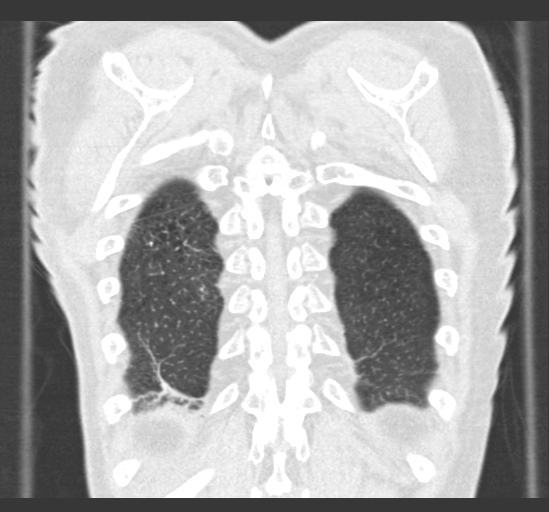
[im 58/144  lung]
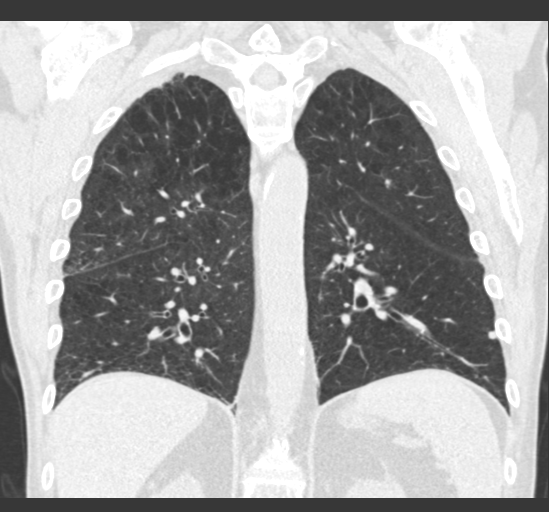
[im 86/144  lung]
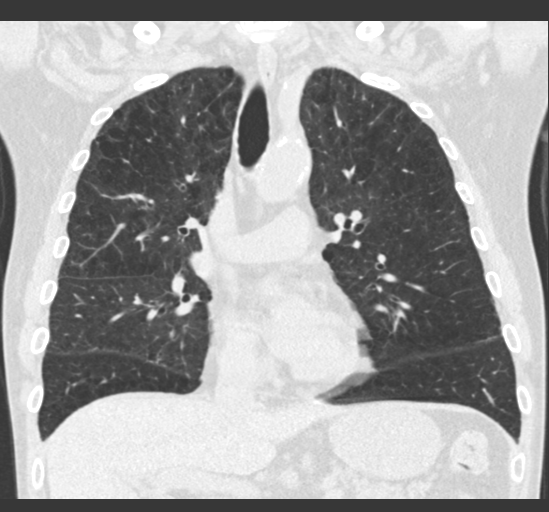

[15 of 36 positions shown; findings below may reference images not displayed]

FINDINGS: Cardiovascular: Heart size is normal. There is no significant
pericardial fluid, thickening or pericardial calcification.
Calcifications of the aortic valve.

Mediastinum/Nodes: No pathologically enlarged mediastinal or hilar
lymph nodes. Please note that accurate exclusion of hilar adenopathy
is limited on noncontrast CT scans. Esophagus is unremarkable in
appearance. No axillary lymphadenopathy.

Lungs/Pleura: Multiples small pulmonary nodules are again noted
throughout the lungs bilaterally, generally unchanged in size,
number and distribution. The largest of these nodules is again noted
in the right middle lobe (axial image 99 of series 4) measuring 5 x
7 mm (mean diameter of 6 mm). There are 2 new pulmonary nodules in
the right lower lobe (axial image 110 of series 4) measuring 5 mm,
and in the left upper lobe (axial image 75 of series 4) measuring 4
mm. Diffuse bronchial wall thickening with severe centrilobular and
moderate paraseptal emphysema. No acute consolidative airspace
disease. No pleural effusions. Scattered areas of linear scarring
are noted throughout the lungs bilaterally.

Upper Abdomen: Numerous calcified granulomas throughout the spleen.
Aortic atherosclerosis.

Musculoskeletal: There are no aggressive appearing lytic or blastic
lesions noted in the visualized portions of the skeleton.
IMPRESSION: 1. Multiple pulmonary nodules scattered throughout the lungs
bilaterally appear generally stable in size, number and
distribution, with exception of small pulmonary nodules in the right
lower lobe and left upper lobe which are new, as detailed above.
These are all favored to be benign, but continued attention on
followup studies is suggested if clinically appropriate.
2. Diffuse bronchial wall thickening with severe centrilobular and
moderate paraseptal emphysema; imaging findings compatible with
clinical history of COPD.
3. Aortic atherosclerosis, in addition to left main and 3 vessel
coronary artery disease. Assessment for potential risk factor
modification, dietary therapy or pharmacologic therapy may be
warranted, if clinically indicated.
4. There are calcifications of the aortic valve. Echocardiographic
correlation for evaluation of potential valvular dysfunction may be
warranted if clinically indicated.

Aortic Atherosclerosis (DVOBF-303.3) and Emphysema (DVOBF-DCE.0).

## 2019-05-27 IMAGING — US US EXTREM LOW VENOUS*R*
1 series · 13 of 24 positions shown · non-contrast
Comparison: None.

CLINICAL DATA: Right lower extremity edema for the past 2 months.
History of prostate cancer. Evaluate for DVT.



[Series 1: us extrem low venous*right* · 0.08mm/px · 13 of 32 slices shown]
[im 1/32]
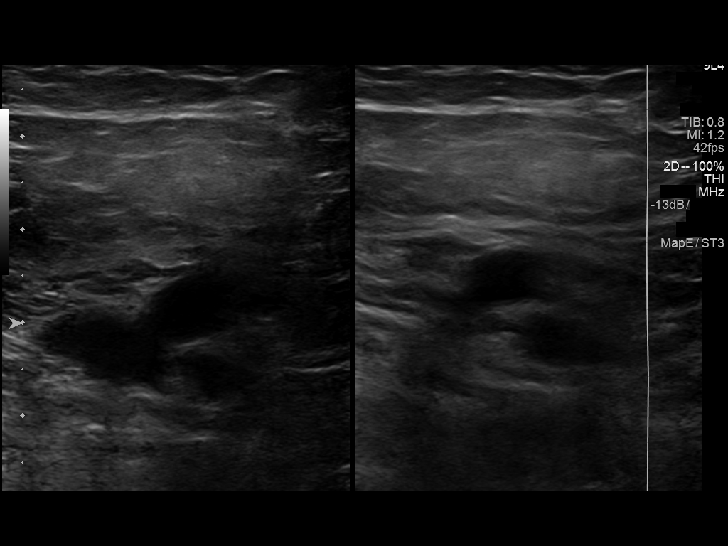
[im 3/32]
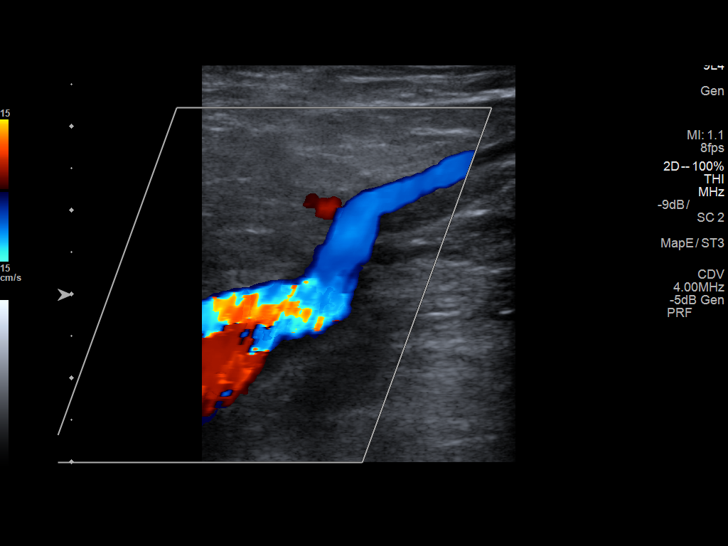
[im 6/32]
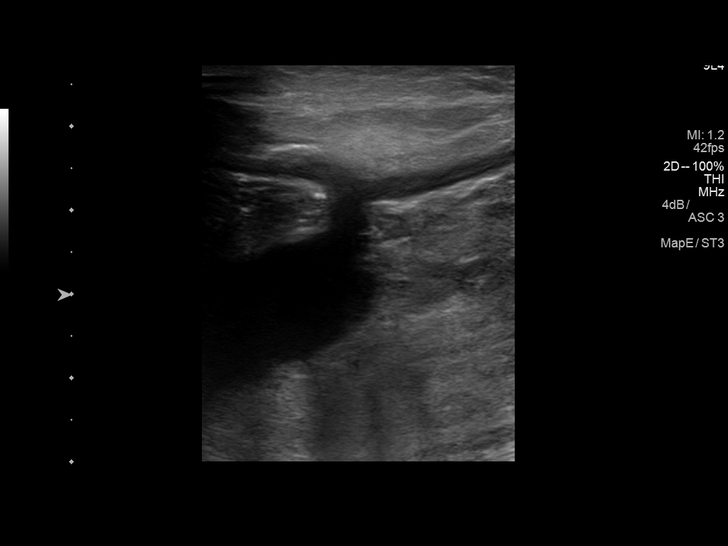
[im 9/32]
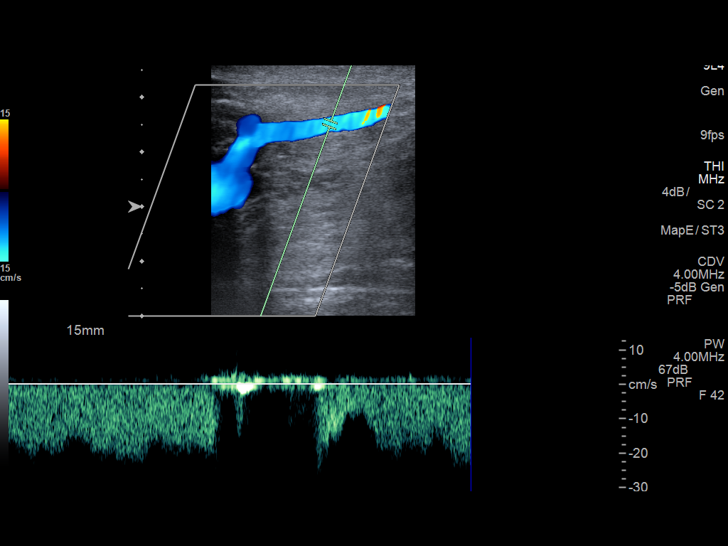
[im 11/32]
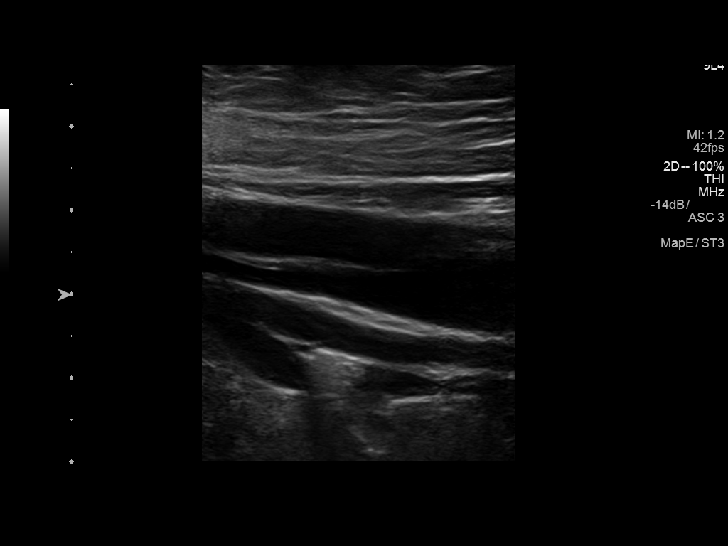
[im 14/32]
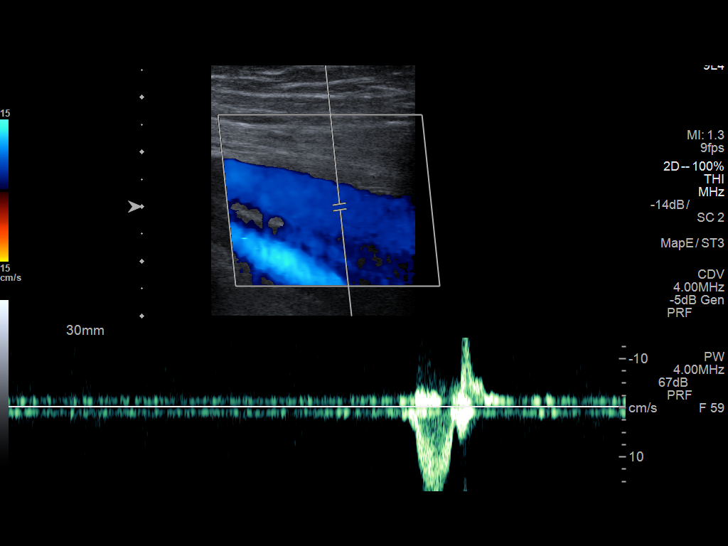
[im 17/32]
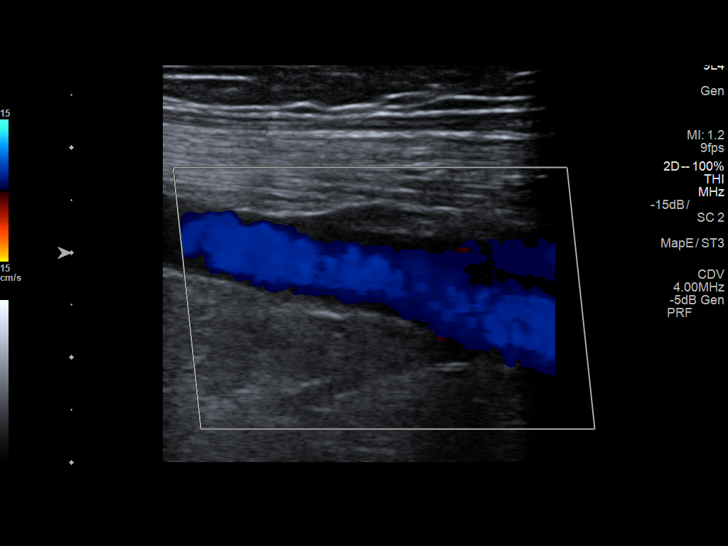
[im 18/32]
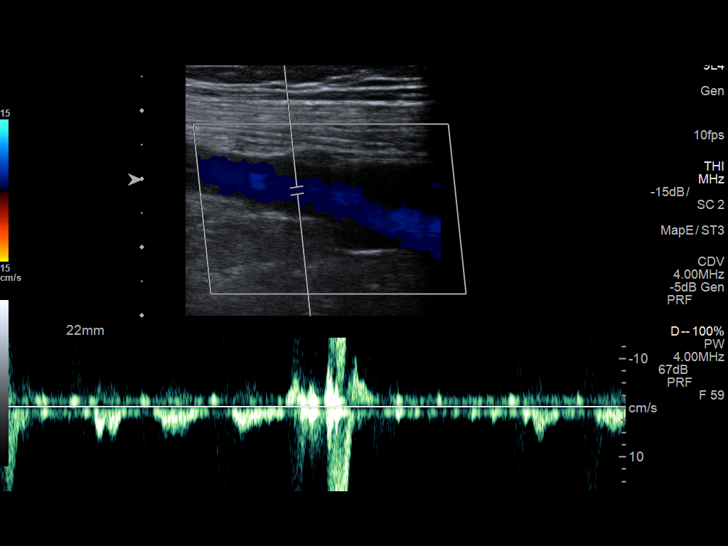
[im 21/32]
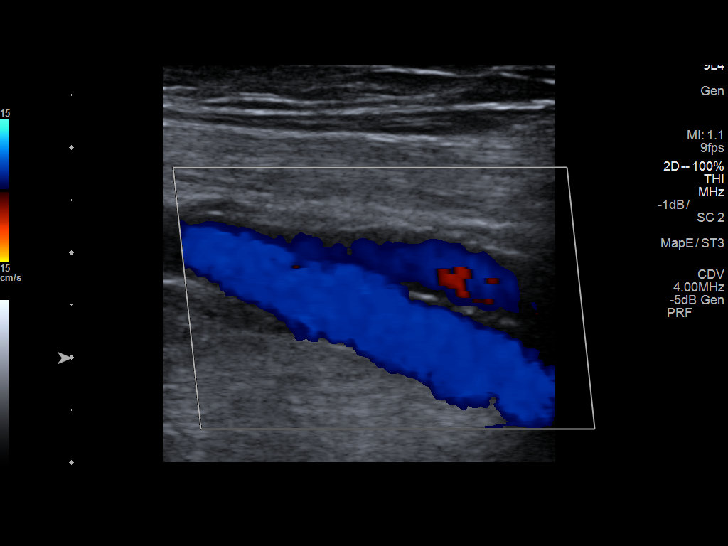
[im 23/32]
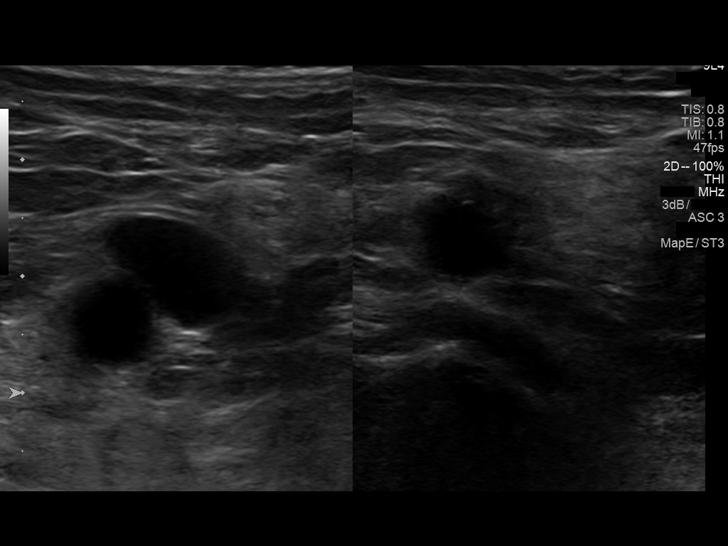
[im 26/32]
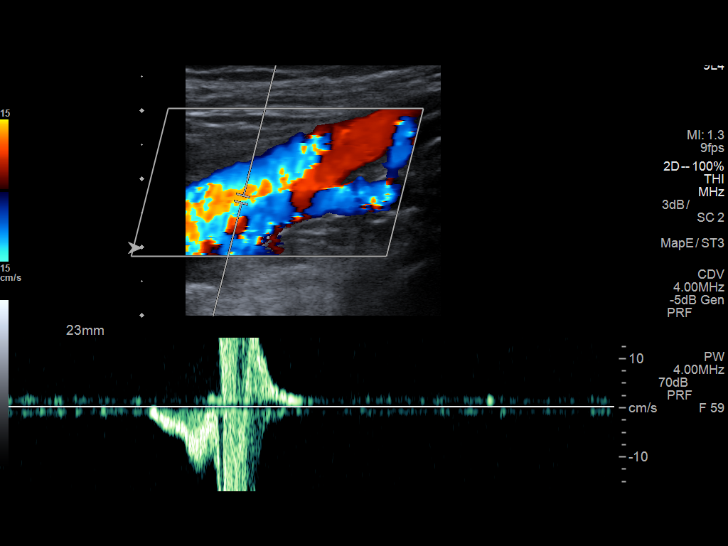
[im 29/32]
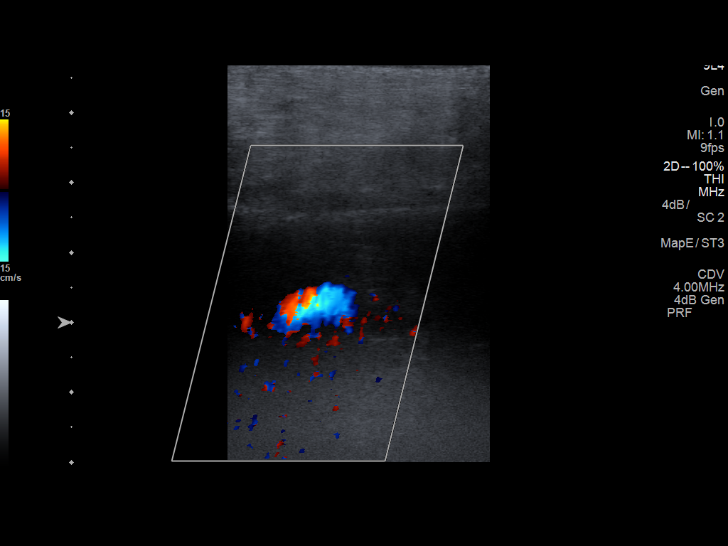
[im 32/32]
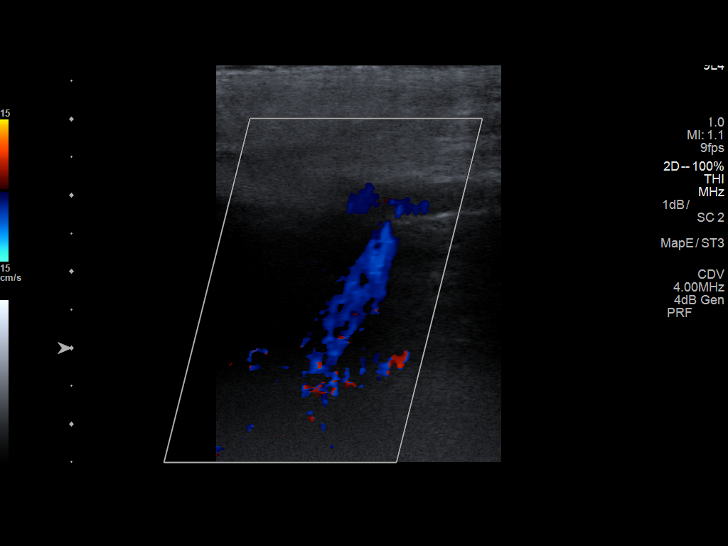

[13 of 24 positions shown; findings below may reference images not displayed]

FINDINGS: Contralateral Common Femoral Vein: Respiratory phasicity is normal
and symmetric with the symptomatic side. No evidence of thrombus.
Normal compressibility.

Common Femoral Vein: No evidence of thrombus. Normal
compressibility, respiratory phasicity and response to augmentation.

Saphenofemoral Junction: No evidence of thrombus. Normal
compressibility and flow on color Doppler imaging.

Profunda Femoral Vein: No evidence of thrombus. Normal
compressibility and flow on color Doppler imaging.

Femoral Vein: No evidence of thrombus. Normal compressibility,
respiratory phasicity and response to augmentation.

Popliteal Vein: No evidence of thrombus. Normal compressibility,
respiratory phasicity and response to augmentation.

Calf Veins: No evidence of thrombus. Normal compressibility and flow
on color Doppler imaging.

Superficial Great Saphenous Vein: No evidence of thrombus. Normal
compressibility.

Venous Reflux:  None.

Other Findings:  None.
IMPRESSION: No evidence of DVT within the right lower extremity.

## 2019-08-16 DEATH — deceased

## 2019-10-18 IMAGING — DX DG CHEST 2V
2 series · 2 of 2 positions shown · non-contrast
Comparison: Prior chest x-ray 11/03/2016

CLINICAL DATA: 76-year-old female with shortness of breath for the
past day

EXAM:
CHEST - 2 VIEW

[chest lat]
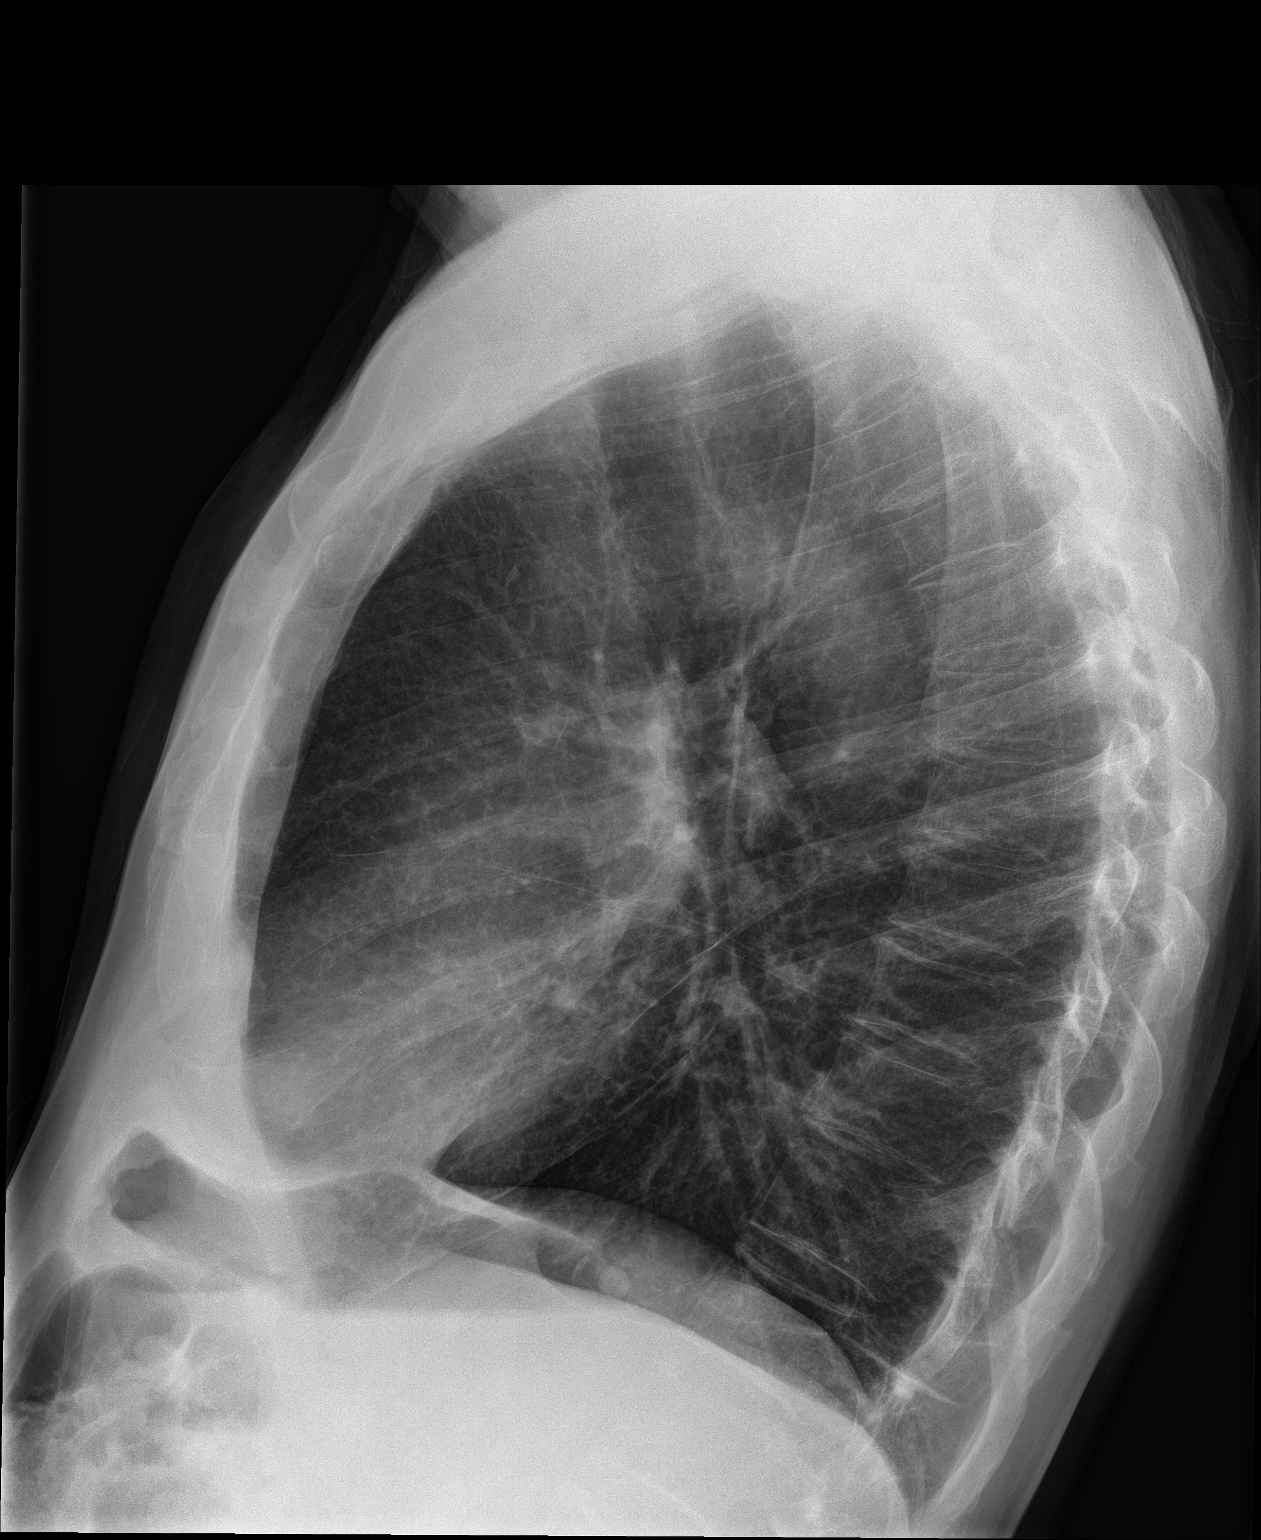

[chest pa]
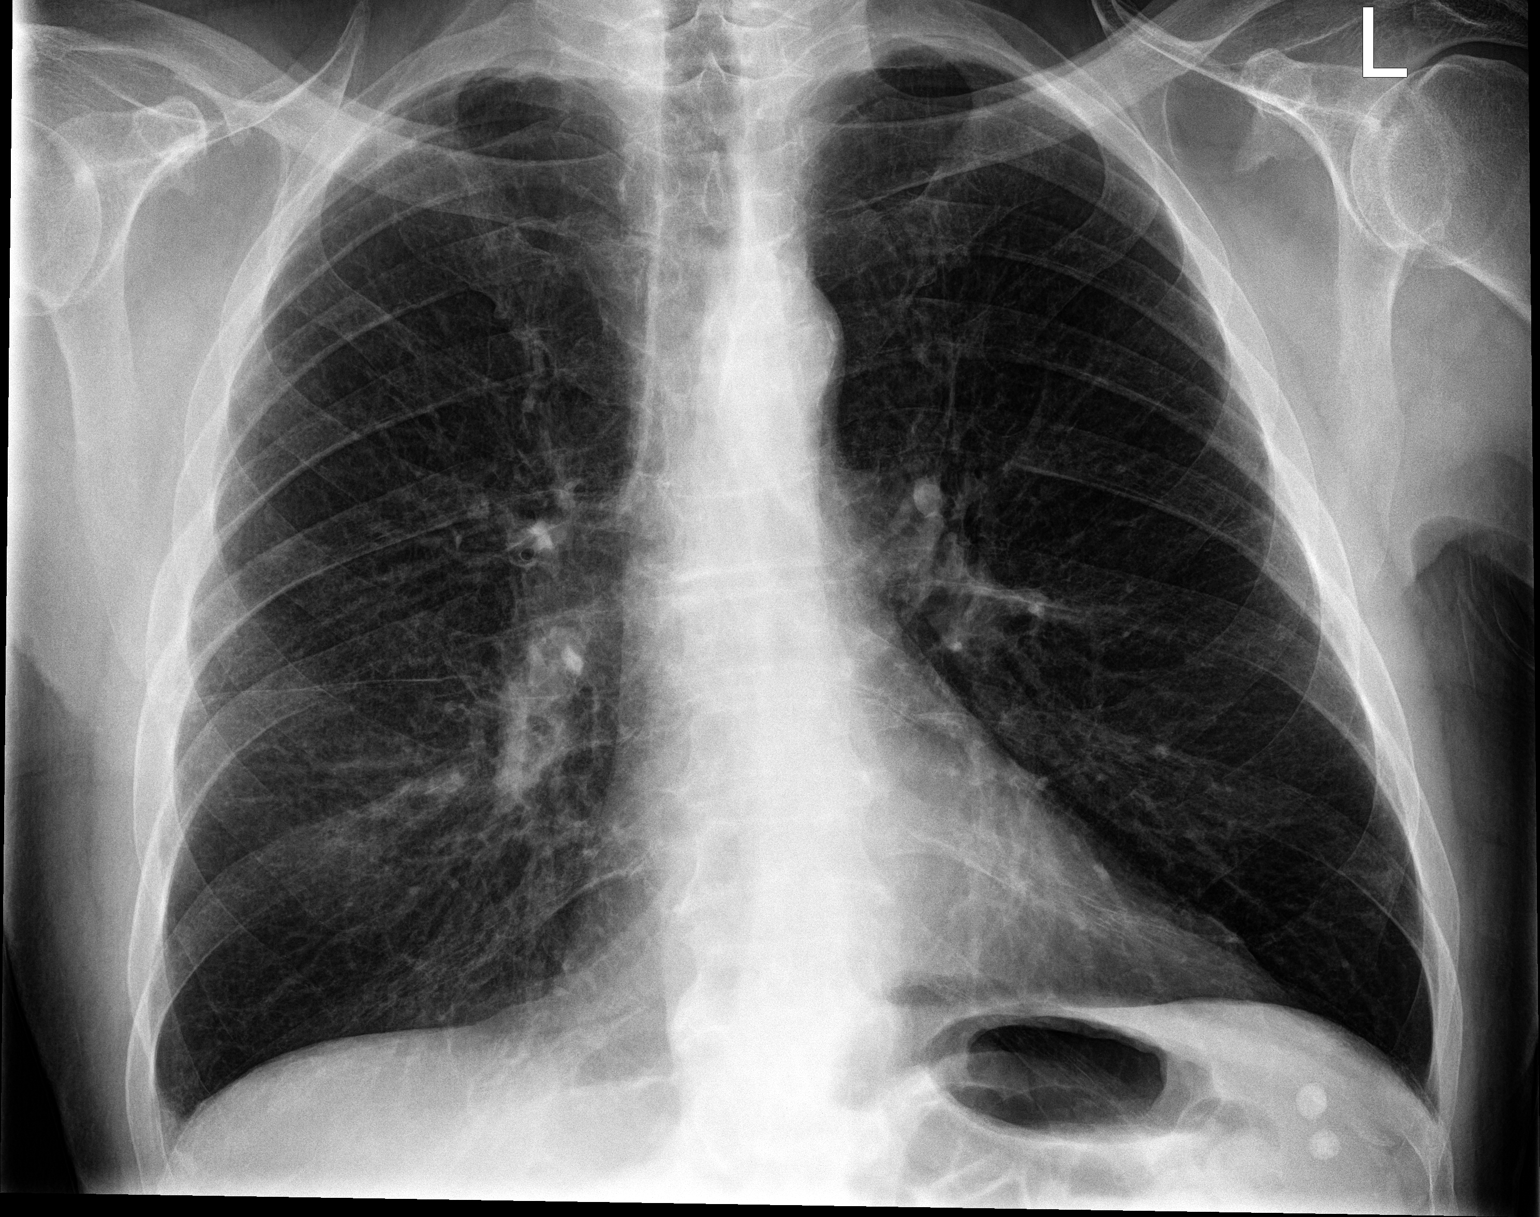

[2 of 2 positions shown; findings below may reference images not displayed]

FINDINGS: Stable cardiac and mediastinal contours. Trace atherosclerotic
calcifications present in the transverse aorta. The lungs remain
hyperinflated. Increased retrosternal clear space on the lateral
view. Stable calcified granuloma in the left lower lobe. No
pneumothorax, pleural effusion or pulmonary edema. No acute osseous
abnormality.
IMPRESSION: 1. Pulmonary hyperinflation suggests underlying COPD.
2. No acute cardiopulmonary process.
3.  Aortic Atherosclerosis (R4XH2-170.0)
